# Patient Record
Sex: Male | Born: 1935 | Race: White | Hispanic: No | Marital: Single | Smoking: Former smoker
Health system: Southern US, Community
[De-identification: ages and names within clinical notes are randomized; demographics above are authoritative.]

## PROBLEM LIST (undated history)

## (undated) DIAGNOSIS — N189 Chronic kidney disease, unspecified: Secondary | ICD-10-CM

## (undated) DIAGNOSIS — F0390 Unspecified dementia without behavioral disturbance: Secondary | ICD-10-CM

## (undated) DIAGNOSIS — I5022 Chronic systolic (congestive) heart failure: Principal | ICD-10-CM

## (undated) DIAGNOSIS — D696 Thrombocytopenia, unspecified: Secondary | ICD-10-CM

## (undated) DIAGNOSIS — I509 Heart failure, unspecified: Secondary | ICD-10-CM

## (undated) DIAGNOSIS — E785 Hyperlipidemia, unspecified: Secondary | ICD-10-CM

## (undated) DIAGNOSIS — H9190 Unspecified hearing loss, unspecified ear: Secondary | ICD-10-CM

## (undated) DIAGNOSIS — G629 Polyneuropathy, unspecified: Secondary | ICD-10-CM

## (undated) DIAGNOSIS — I739 Peripheral vascular disease, unspecified: Secondary | ICD-10-CM

## (undated) DIAGNOSIS — D649 Anemia, unspecified: Secondary | ICD-10-CM

## (undated) DIAGNOSIS — I10 Essential (primary) hypertension: Secondary | ICD-10-CM

## (undated) DIAGNOSIS — R001 Bradycardia, unspecified: Secondary | ICD-10-CM

## (undated) DIAGNOSIS — R1310 Dysphagia, unspecified: Secondary | ICD-10-CM

## (undated) DIAGNOSIS — I25810 Atherosclerosis of coronary artery bypass graft(s) without angina pectoris: Secondary | ICD-10-CM

## (undated) HISTORY — DX: Unspecified dementia, unspecified severity, without behavioral disturbance, psychotic disturbance, mood disturbance, and anxiety: F03.90

## (undated) HISTORY — DX: Hyperlipidemia, unspecified: E78.5

## (undated) HISTORY — DX: Chronic systolic (congestive) heart failure: I50.22

## (undated) HISTORY — PX: CORONARY ARTERY BYPASS GRAFT: SHX141

## (undated) HISTORY — DX: Essential (primary) hypertension: I10

## (undated) HISTORY — DX: Peripheral vascular disease, unspecified: I73.9

## (undated) HISTORY — DX: Atherosclerosis of coronary artery bypass graft(s) without angina pectoris: I25.810

---

## 1998-05-24 ENCOUNTER — Other Ambulatory Visit: Admission: RE | Admit: 1998-05-24 | Discharge: 1998-05-24 | Payer: Self-pay | Admitting: Family Medicine

## 2002-04-08 ENCOUNTER — Encounter: Payer: Self-pay | Admitting: *Deleted

## 2002-04-08 ENCOUNTER — Inpatient Hospital Stay (HOSPITAL_COMMUNITY): Admission: RE | Admit: 2002-04-08 | Discharge: 2002-04-15 | Payer: Self-pay | Admitting: *Deleted

## 2002-04-09 ENCOUNTER — Encounter: Payer: Self-pay | Admitting: Thoracic Surgery (Cardiothoracic Vascular Surgery)

## 2002-04-10 ENCOUNTER — Encounter: Payer: Self-pay | Admitting: Thoracic Surgery (Cardiothoracic Vascular Surgery)

## 2002-04-11 ENCOUNTER — Encounter: Payer: Self-pay | Admitting: Thoracic Surgery (Cardiothoracic Vascular Surgery)

## 2002-10-28 ENCOUNTER — Ambulatory Visit (HOSPITAL_COMMUNITY): Admission: RE | Admit: 2002-10-28 | Discharge: 2002-10-29 | Payer: Self-pay | Admitting: Cardiology

## 2002-10-28 ENCOUNTER — Encounter: Payer: Self-pay | Admitting: Cardiology

## 2004-11-23 ENCOUNTER — Ambulatory Visit: Payer: Self-pay | Admitting: Family Medicine

## 2005-04-19 ENCOUNTER — Ambulatory Visit: Payer: Self-pay | Admitting: Family Medicine

## 2005-04-27 ENCOUNTER — Ambulatory Visit: Payer: Self-pay | Admitting: Family Medicine

## 2005-11-01 ENCOUNTER — Ambulatory Visit: Payer: Self-pay | Admitting: Family Medicine

## 2006-01-17 ENCOUNTER — Ambulatory Visit: Payer: Self-pay | Admitting: Family Medicine

## 2006-06-11 ENCOUNTER — Ambulatory Visit: Payer: Self-pay | Admitting: Family Medicine

## 2006-06-25 ENCOUNTER — Ambulatory Visit: Payer: Self-pay | Admitting: Internal Medicine

## 2006-07-10 ENCOUNTER — Ambulatory Visit: Payer: Self-pay | Admitting: Internal Medicine

## 2006-08-14 ENCOUNTER — Ambulatory Visit: Payer: Self-pay | Admitting: Family Medicine

## 2006-10-21 ENCOUNTER — Ambulatory Visit: Payer: Self-pay | Admitting: Family Medicine

## 2006-11-25 ENCOUNTER — Ambulatory Visit: Payer: Self-pay | Admitting: Family Medicine

## 2009-03-24 ENCOUNTER — Ambulatory Visit (HOSPITAL_COMMUNITY): Admission: RE | Admit: 2009-03-24 | Discharge: 2009-03-24 | Payer: Self-pay | Admitting: Podiatry

## 2010-05-26 ENCOUNTER — Encounter (HOSPITAL_BASED_OUTPATIENT_CLINIC_OR_DEPARTMENT_OTHER): Admission: RE | Admit: 2010-05-26 | Discharge: 2010-08-01 | Payer: Self-pay | Admitting: Internal Medicine

## 2010-06-01 ENCOUNTER — Ambulatory Visit: Payer: Self-pay | Admitting: Vascular Surgery

## 2010-06-07 ENCOUNTER — Ambulatory Visit (HOSPITAL_COMMUNITY): Admission: RE | Admit: 2010-06-07 | Discharge: 2010-06-07 | Payer: Self-pay | Admitting: Internal Medicine

## 2010-08-07 ENCOUNTER — Encounter (HOSPITAL_BASED_OUTPATIENT_CLINIC_OR_DEPARTMENT_OTHER): Admission: RE | Admit: 2010-08-07 | Discharge: 2010-08-21 | Payer: Self-pay | Admitting: Internal Medicine

## 2010-10-09 ENCOUNTER — Ambulatory Visit (HOSPITAL_COMMUNITY)
Admission: RE | Admit: 2010-10-09 | Discharge: 2010-10-09 | Payer: Self-pay | Source: Home / Self Care | Admitting: Internal Medicine

## 2010-10-09 ENCOUNTER — Encounter (HOSPITAL_BASED_OUTPATIENT_CLINIC_OR_DEPARTMENT_OTHER)
Admission: RE | Admit: 2010-10-09 | Discharge: 2010-10-24 | Payer: Self-pay | Source: Home / Self Care | Admitting: Internal Medicine

## 2010-12-25 ENCOUNTER — Inpatient Hospital Stay (HOSPITAL_COMMUNITY)
Admission: EM | Admit: 2010-12-25 | Discharge: 2011-01-02 | DRG: 638 | Disposition: A | Discharge status: Skilled Nursing Facility | Payer: MEDICARE | Attending: Family Medicine | Admitting: Family Medicine

## 2010-12-25 ENCOUNTER — Emergency Department (HOSPITAL_COMMUNITY): Payer: MEDICARE

## 2010-12-25 DIAGNOSIS — R4182 Altered mental status, unspecified: Secondary | ICD-10-CM

## 2010-12-25 DIAGNOSIS — E785 Hyperlipidemia, unspecified: Secondary | ICD-10-CM | POA: Diagnosis present

## 2010-12-25 DIAGNOSIS — F0390 Unspecified dementia without behavioral disturbance: Secondary | ICD-10-CM | POA: Diagnosis present

## 2010-12-25 DIAGNOSIS — Z23 Encounter for immunization: Secondary | ICD-10-CM

## 2010-12-25 DIAGNOSIS — N189 Chronic kidney disease, unspecified: Secondary | ICD-10-CM | POA: Diagnosis present

## 2010-12-25 DIAGNOSIS — I129 Hypertensive chronic kidney disease with stage 1 through stage 4 chronic kidney disease, or unspecified chronic kidney disease: Secondary | ICD-10-CM | POA: Diagnosis present

## 2010-12-25 DIAGNOSIS — I2789 Other specified pulmonary heart diseases: Secondary | ICD-10-CM | POA: Diagnosis present

## 2010-12-25 DIAGNOSIS — E119 Type 2 diabetes mellitus without complications: Secondary | ICD-10-CM

## 2010-12-25 DIAGNOSIS — E860 Dehydration: Secondary | ICD-10-CM | POA: Diagnosis present

## 2010-12-25 DIAGNOSIS — I509 Heart failure, unspecified: Secondary | ICD-10-CM | POA: Diagnosis present

## 2010-12-25 DIAGNOSIS — N179 Acute kidney failure, unspecified: Secondary | ICD-10-CM | POA: Diagnosis present

## 2010-12-25 DIAGNOSIS — E1101 Type 2 diabetes mellitus with hyperosmolarity with coma: Principal | ICD-10-CM | POA: Diagnosis present

## 2010-12-25 DIAGNOSIS — I2510 Atherosclerotic heart disease of native coronary artery without angina pectoris: Secondary | ICD-10-CM | POA: Diagnosis present

## 2010-12-25 DIAGNOSIS — I10 Essential (primary) hypertension: Secondary | ICD-10-CM

## 2010-12-25 DIAGNOSIS — Z7982 Long term (current) use of aspirin: Secondary | ICD-10-CM

## 2010-12-25 DIAGNOSIS — Z79899 Other long term (current) drug therapy: Secondary | ICD-10-CM

## 2010-12-25 DIAGNOSIS — Z951 Presence of aortocoronary bypass graft: Secondary | ICD-10-CM

## 2010-12-25 LAB — CBC
HCT: 41.8 % (ref 39.0–52.0)
MCHC: 34.4 g/dL (ref 30.0–36.0)
MCV: 87.3 fL (ref 78.0–100.0)
RDW: 12.9 % (ref 11.5–15.5)
WBC: 5.5 10*3/uL (ref 4.0–10.5)

## 2010-12-25 LAB — BASIC METABOLIC PANEL
BUN: 25 mg/dL — ABNORMAL HIGH (ref 6–23)
CO2: 20 mEq/L (ref 19–32)
GFR calc non Af Amer: 44 mL/min — ABNORMAL LOW (ref >60)
Glucose, Bld: 679 mg/dL (ref 70–99)
Potassium: 4.1 mEq/L (ref 3.5–5.1)

## 2010-12-25 LAB — RAPID URINE DRUG SCREEN, HOSP PERFORMED
Barbiturates: NOT DETECTED
Opiates: NOT DETECTED

## 2010-12-25 LAB — URINALYSIS, ROUTINE W REFLEX MICROSCOPIC
Bilirubin Urine: NEGATIVE
Ketones, ur: 15 mg/dL — AB
Nitrite: NEGATIVE
Urine Glucose, Fasting: >1000 mg/dL — AB
pH: 5 (ref 5.0–8.0)

## 2010-12-25 LAB — HEPATIC FUNCTION PANEL
ALT: 37 U/L (ref 0–53)
AST: 36 U/L (ref 0–37)
Albumin: 3.6 g/dL (ref 3.5–5.2)
Alkaline Phosphatase: 90 U/L (ref 39–117)
Bilirubin, Direct: <0.1 mg/dL (ref 0.0–0.3)
Total Bilirubin: 0.3 mg/dL (ref 0.3–1.2)

## 2010-12-25 LAB — GLUCOSE, CAPILLARY: Glucose-Capillary: 582 mg/dL (ref 70–99)

## 2010-12-25 LAB — DIFFERENTIAL
Eosinophils Relative: 2 % (ref 0–5)
Lymphocytes Relative: 33 % (ref 12–46)
Lymphs Abs: 1.8 10*3/uL (ref 0.7–4.0)
Monocytes Absolute: 0.4 10*3/uL (ref 0.1–1.0)

## 2010-12-25 LAB — POCT I-STAT 3, VENOUS BLOOD GAS (G3P V)
Bicarbonate: 23.6 mEq/L (ref 20.0–24.0)
TCO2: 25 mmol/L (ref 0–100)
pCO2, Ven: 37.4 mmHg — ABNORMAL LOW (ref 45.0–50.0)
pH, Ven: 7.406 — ABNORMAL HIGH (ref 7.250–7.300)

## 2010-12-25 LAB — URINE MICROSCOPIC-ADD ON

## 2010-12-26 LAB — CBC
HCT: 38.7 % — ABNORMAL LOW (ref 39.0–52.0)
Hemoglobin: 12.9 g/dL — ABNORMAL LOW (ref 13.0–17.0)
MCH: 28.9 pg (ref 26.0–34.0)
MCHC: 33.3 g/dL (ref 30.0–36.0)
RBC: 4.46 MIL/uL (ref 4.22–5.81)

## 2010-12-26 LAB — BRAIN NATRIURETIC PEPTIDE: Pro B Natriuretic peptide (BNP): 30 pg/mL (ref 0.0–100.0)

## 2010-12-26 LAB — GLUCOSE, CAPILLARY
Glucose-Capillary: 206 mg/dL — ABNORMAL HIGH (ref 70–99)
Glucose-Capillary: 175 mg/dL — ABNORMAL HIGH (ref 70–99)
Glucose-Capillary: 166 mg/dL — ABNORMAL HIGH (ref 70–99)
Glucose-Capillary: 177 mg/dL — ABNORMAL HIGH (ref 70–99)
Glucose-Capillary: 155 mg/dL — ABNORMAL HIGH (ref 70–99)
Glucose-Capillary: 172 mg/dL — ABNORMAL HIGH (ref 70–99)
Glucose-Capillary: 156 mg/dL — ABNORMAL HIGH (ref 70–99)
Glucose-Capillary: 319 mg/dL — ABNORMAL HIGH (ref 70–99)

## 2010-12-26 LAB — BASIC METABOLIC PANEL
CO2: 24 mEq/L (ref 19–32)
Chloride: 109 mEq/L (ref 96–112)
GFR calc Af Amer: >60 mL/min (ref >60)
Potassium: 2.9 mEq/L — ABNORMAL LOW (ref 3.5–5.1)
Sodium: 142 mEq/L (ref 135–145)

## 2010-12-26 LAB — CARDIAC PANEL(CRET KIN+CKTOT+MB+TROPI)
CK, MB: 10.9 ng/mL (ref 0.3–4.0)
Relative Index: 1.4 (ref 0.0–2.5)
Total CK: 796 U/L — ABNORMAL HIGH (ref 7–232)
Troponin I: 0.06 ng/mL (ref 0.00–0.06)

## 2010-12-26 LAB — LIPID PANEL
Cholesterol: 248 mg/dL — ABNORMAL HIGH (ref 0–200)
LDL Cholesterol: 162 mg/dL — ABNORMAL HIGH (ref 0–99)

## 2010-12-26 LAB — T4, FREE: Free T4: 1.16 ng/dL (ref 0.80–1.80)

## 2010-12-26 LAB — HEMOGLOBIN A1C: Hgb A1c MFr Bld: 13.5 % — ABNORMAL HIGH (ref <5.7)

## 2010-12-27 DIAGNOSIS — F0390 Unspecified dementia without behavioral disturbance: Secondary | ICD-10-CM

## 2010-12-27 DIAGNOSIS — R609 Edema, unspecified: Secondary | ICD-10-CM

## 2010-12-27 LAB — GLUCOSE, CAPILLARY
Glucose-Capillary: 114 mg/dL — ABNORMAL HIGH (ref 70–99)
Glucose-Capillary: 377 mg/dL — ABNORMAL HIGH (ref 70–99)
Glucose-Capillary: 125 mg/dL — ABNORMAL HIGH (ref 70–99)
Glucose-Capillary: 192 mg/dL — ABNORMAL HIGH (ref 70–99)

## 2010-12-27 LAB — BASIC METABOLIC PANEL
CO2: 20 mEq/L (ref 19–32)
Calcium: 8.8 mg/dL (ref 8.4–10.5)
GFR calc Af Amer: >60 mL/min (ref >60)
Potassium: 3.4 mEq/L — ABNORMAL LOW (ref 3.5–5.1)
Sodium: 141 mEq/L (ref 135–145)

## 2010-12-27 LAB — CBC
Hemoglobin: 14.4 g/dL (ref 13.0–17.0)
MCHC: 33.7 g/dL (ref 30.0–36.0)
RBC: 4.94 MIL/uL (ref 4.22–5.81)
WBC: 6.6 10*3/uL (ref 4.0–10.5)

## 2010-12-28 DIAGNOSIS — F0390 Unspecified dementia without behavioral disturbance: Secondary | ICD-10-CM

## 2010-12-28 LAB — GLUCOSE, CAPILLARY
Glucose-Capillary: 168 mg/dL — ABNORMAL HIGH (ref 70–99)
Glucose-Capillary: 202 mg/dL — ABNORMAL HIGH (ref 70–99)

## 2010-12-28 LAB — BASIC METABOLIC PANEL
CO2: 18 mEq/L — ABNORMAL LOW (ref 19–32)
Glucose, Bld: 165 mg/dL — ABNORMAL HIGH (ref 70–99)
Potassium: 3.8 mEq/L (ref 3.5–5.1)
Sodium: 138 mEq/L (ref 135–145)

## 2010-12-29 LAB — GLUCOSE, CAPILLARY
Glucose-Capillary: 184 mg/dL — ABNORMAL HIGH (ref 70–99)
Glucose-Capillary: 200 mg/dL — ABNORMAL HIGH (ref 70–99)
Glucose-Capillary: 211 mg/dL — ABNORMAL HIGH (ref 70–99)
Glucose-Capillary: 107 mg/dL — ABNORMAL HIGH (ref 70–99)
Glucose-Capillary: 135 mg/dL — ABNORMAL HIGH (ref 70–99)

## 2010-12-29 LAB — BASIC METABOLIC PANEL
BUN: 16 mg/dL (ref 6–23)
Calcium: 8.4 mg/dL (ref 8.4–10.5)
Creatinine, Ser: 1.2 mg/dL (ref 0.4–1.5)
GFR calc non Af Amer: 59 mL/min — ABNORMAL LOW (ref >60)

## 2010-12-30 LAB — GLUCOSE, CAPILLARY: Glucose-Capillary: 188 mg/dL — ABNORMAL HIGH (ref 70–99)

## 2010-12-31 LAB — GLUCOSE, CAPILLARY
Glucose-Capillary: 163 mg/dL — ABNORMAL HIGH (ref 70–99)
Glucose-Capillary: 202 mg/dL — ABNORMAL HIGH (ref 70–99)

## 2010-12-31 LAB — BASIC METABOLIC PANEL
BUN: 18 mg/dL (ref 6–23)
Chloride: 107 mEq/L (ref 96–112)
GFR calc Af Amer: >60 mL/min (ref >60)
GFR calc non Af Amer: 52 mL/min — ABNORMAL LOW (ref >60)
Potassium: 4.1 mEq/L (ref 3.5–5.1)

## 2010-12-31 NOTE — Consult Note (Signed)
NAMEELAN, Travis Coleman NO.:  [id number]  MEDICAL RECORD NO.:  [id number]           PATIENT TYPE:  I  LOCATION:  2502                         FACILITY:  [facility]  PHYSICIAN:  Anselm Jungling, MD  DATE OF BIRTH:  16-Feb-1932  DATE OF CONSULTATION:  12/26/2010 DATE OF DISCHARGE:                                CONSULTATION   IDENTIFYING DATA AND REASON FOR REFERRAL:  This is my first contact with Travis Coleman, a 77 year old Caucasian male currently under the care of the Medicine Teaching Service here at [facility], under the auspices of Dr. Doralee Albino.  Psychiatric consultation is requested to assess mental status, assess competency, and make recommendations.  HISTORY OF THE PRESENTING PROBLEMS:  At this point, there is fairly little clear historical information regarding the patient.  He has had limited contacts within the [facility] in the past.  He does have a history of diabetes mellitus, and recently has become insulin dependent.  In addition, he has a history of coronary artery disease, and is status post CABG procedure.  At this time, he is being treated for his various medical conditions, but most prominently for what appears to be an episode of delirium.  In addition, there may be a history of underlying dementia.  Current medical conditions include hypertension, hyperlipidemia, and diabetes mellitus.  The patient is currently receiving hydralazine, Crestor, donepezil, aspirin, carvedilol, and Lantus.  CT scan of the head shows periventricular and corona radiata white matter hypodensities consistent with chronic ischemic microvascular disease.  The patient has been restarted on Aricept.  The patient has a sister, whose name is Aram Beecham, who is involved in his care.  There is currently question as to whether or not he will need some form of nursing home.  In the early morning of December 26, 2010, the patient was  acutely agitated and delirium and required a brief restraints.  MENTAL STATUS:  On observation, the patient is a well-nourished, normally-developed adult male who I found dozing in his hospital bed in the early evening of December 26, 2010.  I was able to wake him fairly easily, but when I did so, it was immediately apparent that he was quite confused, and disoriented.  Actually he is only oriented to person.  He was pleasant, made brief eye contact, but did not make any goal-directed or coherent statements except to state that he was about to get up any way.  He made brief attempts to sit up in bed, but was unable to.  It was apparent that the patient is still in a state of delirium and not able to converse or give history at this time.  IMPRESSION:  AXIS I:  Delirium, secondary to medical conditions and rule out history of dementia, not otherwise specified. AXIS II:  Deferred. AXIS III:  History of hyperlipidemia, diabetes mellitus, hypertension, chronic ischemic microvascular cerebral disease. AXIS IV:  Stressors moderate. AXIS V:  GAF 35.  RECOMMENDATIONS:  I agree with the patient's current management including reinitiation of Aricept.  I agree with Dr. Cyndia Skeeters assessment that we will need to see  how his condition unfolds as he is stabilized medically.  Psychiatry will continue to follow him on a daily basis in the meantime.  Thank you for referring this most interesting patient to Psychiatry service.     Anselm Jungling, MD     SPB/MEDQ  D:  12/26/2010  T:  12/26/2010  Job:  [id number]  Electronically Signed by Geralyn Flash MD on 12/29/2010 08:53:38 AM

## 2011-01-01 LAB — GLUCOSE, CAPILLARY
Glucose-Capillary: 196 mg/dL — ABNORMAL HIGH (ref 70–99)
Glucose-Capillary: 178 mg/dL — ABNORMAL HIGH (ref 70–99)
Glucose-Capillary: 109 mg/dL — ABNORMAL HIGH (ref 70–99)

## 2011-01-02 ENCOUNTER — Encounter: Payer: Self-pay | Admitting: Family Medicine

## 2011-01-02 LAB — BASIC METABOLIC PANEL
BUN: 21 mg/dL (ref 6–23)
Chloride: 107 mEq/L (ref 96–112)
Creatinine, Ser: 1.42 mg/dL (ref 0.4–1.5)
Glucose, Bld: 69 mg/dL — ABNORMAL LOW (ref 70–99)
Potassium: 3.7 mEq/L (ref 3.5–5.1)

## 2011-01-02 LAB — GLUCOSE, CAPILLARY

## 2011-01-03 ENCOUNTER — Encounter: Payer: Self-pay | Admitting: Family Medicine

## 2011-01-03 ENCOUNTER — Non-Acute Institutional Stay: Payer: Self-pay | Admitting: Family Medicine

## 2011-01-03 DIAGNOSIS — I2510 Atherosclerotic heart disease of native coronary artery without angina pectoris: Secondary | ICD-10-CM | POA: Insufficient documentation

## 2011-01-03 DIAGNOSIS — Z593 Problems related to living in residential institution: Secondary | ICD-10-CM | POA: Insufficient documentation

## 2011-01-03 DIAGNOSIS — F0390 Unspecified dementia without behavioral disturbance: Secondary | ICD-10-CM

## 2011-01-03 DIAGNOSIS — I10 Essential (primary) hypertension: Secondary | ICD-10-CM | POA: Insufficient documentation

## 2011-01-03 DIAGNOSIS — E118 Type 2 diabetes mellitus with unspecified complications: Secondary | ICD-10-CM

## 2011-01-03 DIAGNOSIS — E785 Hyperlipidemia, unspecified: Secondary | ICD-10-CM

## 2011-01-03 DIAGNOSIS — I739 Peripheral vascular disease, unspecified: Secondary | ICD-10-CM | POA: Insufficient documentation

## 2011-01-03 NOTE — Assessment & Plan Note (Addendum)
MMSE 18/30 per hospital discharge, will plan to repeat in 2 weeks Continue Aricept, continue depakote at this time for behavior problems in setting of dementia will consider Topamax or Carbemezapine Obtain records from Dr. Sandria Manly [facility])

## 2011-01-03 NOTE — Assessment & Plan Note (Signed)
Based on hospital stay and pt safety goal BP 140-160 systolic, therefore will continue coreg and low dose ACEI at current dose and monitor

## 2011-01-03 NOTE — Assessment & Plan Note (Signed)
A1c 13.5 on Dec 26, 2010, pt has not medications in many months secondary to non-compliance and likley difficulty with memory/dementia. Will continue regimine started at the hospital of Lantus 50 units and Novolog 6 units QAC. Goal A1C less than 8.

## 2011-01-03 NOTE — Assessment & Plan Note (Signed)
Continue Crestor 

## 2011-01-03 NOTE — Assessment & Plan Note (Signed)
Pt plans to be a long-term resident

## 2011-01-03 NOTE — Progress Notes (Signed)
  Subjective:    Patient ID: Travis Coleman, male    DOB: 01/19/1932, 77 y.o.   MRN: [id number]  HPI 77 y.o. Male admitted to [facility] with altered mental status after his sister found him to have slurred speech and disoriented via a telephone conversation. Patient was found to be in a non -ketotic hyperglyemic state with CBG of 679. He was started on Insulin drip and transitioned to Lantus and short acting insulin. Of note pt had stopped his medication a few months prior and was not being followed by his PCP or his neurologist Dr. Sandria Manly who presumably sees him for Dementia.  During hospital stay Diabetes was well controlled with Lantus 60 units and Novolog 6 units with each meal, HTN was controlled with Coreg and small dose of ACE-I. Pt also experienced agitation and was started on Depakote 250mg  BID and haldol prn which was discontinued at discharge after consultation with psychiatry in setting of his dementia. Mr. Rabel was transferred to [facility] nursing facility for long-term treatment.     Review of Systems Denies chest pain, fever, SOB, abd pain, occ left leg pain, + leg swelling, no change in bowel or bladder, + urinary hesitancy     Objective:   Physical Exam Gen- elderly male, pleasant, NAD, alert and oriented to person, place, (See below regarding time) HEENT- PERRL , pupils 3mm bilat, non icteric, EOMI, arcus senilis, MMM, mild wax in right canal TM clear, Left TM clear Neck- no JVD, no bruit, supple, normal ROM though limited as pt tilts head forward CVS- RRR, no murmur, scar mid-line chest well healed RESP- CTAB ABD- NABS, soft, NT, ND, scar on right flank  EXT- 1+ edema, venous stasis changes, Left foot -2nd digit ulceration medial aspect of toe, no erythema, TTP Neuro- gait- rotated right foot, non antalgic, wide gait not seen, fair balance, neg rhomberg,  Mentation- orinted to year, month, not date, correctly identified president Recall 3/3 with reinforcement,,  memory 3/3 words, normal finger to nose        Assessment & Plan:

## 2011-01-03 NOTE — Assessment & Plan Note (Signed)
Pt does not appear to have many symptoms from PVD at this time, Will treat venous stasis component with Lasix 20mg  which pt has had in the past based on previous note

## 2011-01-05 NOTE — H&P (Signed)
NAMEKENNEY, GOING                ACCOUNT NO.:  [id number]  MEDICAL RECORD NO.:  [id number]           PATIENT TYPE:  I  LOCATION:  2502                         FACILITY:  [facility]  PHYSICIAN:  Santiago Bumpers. Shirlette Scarber, M.D.DATE OF BIRTH:  1932/08/15  DATE OF ADMISSION:  12/26/2010 DATE OF DISCHARGE:                             HISTORY & PHYSICAL   PRIMARY CARE PHYSICIAN:  Unknown provider at [facility].  CHIEF COMPLAINT:  Altered mental status.  HISTORY OF PRESENT ILLNESS:  History was obtained from the patient's sister, Micah Barnier.  Phone number [phone].  She reports that the patient was not responding to her phone calls this morning. When she went to where he lives at a [facility], his speech was slurred and he was disoriented.  He was awake and otherwise in his normal state of health. She asked if he will be willing to go to the hospital and he agreed. She brought him to the ED.  Per his sister, he has stopped taking his medications for an unknown number of months.  He has lived in the hotel for 2 months.  Prior to that, he lived with his sister for 6 weeks, but she is not sure if he was taking his medications since then.  Prior to living with his sister, he lived alone in his own apartment.  His sister is unsure of his meds and doses.  She knows that he was taking metformin and was started on insulin sometime last February or March.  He has not seen his PCP in almost a year.  He has been seen by Wound Care for lower extremity ulcers.  He has also been seen by neurologist, Dr. Sandria Manly for dementia.  Last seen in the Wound Care, by last dictation, was November 2011, and prior to that he saw Dr. Sandria Manly, Neurology in June 2011.  In the ED, the patient was found to be hyperglycemic with blood sugar of 679. He was started on NovoLog 16 units x2, normal saline bolus, and 500 mL of normal saline at 250 mL per hour as well as Ativan 1 mg IV x1.  ALLERGIES:  The  patient has no know drug allergies.  MEDICATIONS:  His medications were obtained from a wound care note on May 29, 2010, listed as: 1. Carvedilol 6.25 mg p.o. b.i.d. 2. Furosemide 20 mg p.o. daily. 3. Aspirin 81 mg daily. 4. Crestor 20 mg daily. 5. Donepezil 10 mg daily.  PAST MEDICAL HISTORY: 1. Diabetes type 2. 2. Hypertension. 3. Hyperlipidemia. 4. Coronary artery disease status post quadruple bypass in 2004. 5. Dementia.  SOCIAL HISTORY:  The patient lives at a hotel, lives alone.  He is unmarried.  He has no children.  He denies smoking cigarettes.  He does not drink alcohol or use any illicit drugs.  FAMILY HISTORY:  Significant for mother who died at 78 with diabetes and Alzheimer's, father who died at 17 with Alzheimer's, coronary artery disease, and TIA.  One deceased sister, died with Alzheimer's and other 1 brother and 2 sisters who are healthy.  REVIEW OF SYSTEMS:  The patient  denies all but is altered.  Notably, he denies fevers, chills, chest pain, shortness of breath, cough, nausea, vomiting, diarrhea, or dysuria.  He also denies visual changes, dysarthria, or weakness.  He denies polyuria, polydipsia, or heat or cold intolerance.  Code status is unable to be obtained, sister does not know.  For now the patient is full code.  PHYSICAL EXAMINATION:  VITAL SIGNS:  Temperature 97.9; pulse 98; respiratory rate 19; blood pressure 192/88, ranging 182-192/68-88; and pO2 of 96-100% on room air. GENERAL:  The patient is awake, slightly agitated, and pulling at his line. HEENT:  NCAT.  PERRLA.  Dry mucous membrane. NECK:  Nontender, supple with no cervical adenopathy. CARDIOVASCULAR:  S1 and S2.  Sinus rhythm. LUNGS:  Clear to auscultation bilaterally.  Normal work of breathing. ABDOMEN:  Normoactive bowel sounds.  Soft, nontender, and nondistended. EXTREMITIES:  He has 5/5 strength in the upper and lower extremities. 2+ pulses in bilateral upper extremities, 1+ in  bilateral lower extremities.  1+ edema in bilateral lower extremities. NEURO:  He is alert and oriented to person, to the month, not to place. He does recognize his sister.  Cranial nerves II-XII are grossly intact. Sensory and motor grossly intact.  He has decreased reflexes bilaterally even.  LABORATORY DATA AND STUDIES:  Sodium 138, potassium 4.1, chloride 95, bicarb 20, BUN 25, creatinine 1.55, and glucose 679 down to 582 most recently.  CBG:  PH 7.406, PCO2 37.4, PO2 41, bicarb 22.6, O2 sat 78%, base excess less than 1.  Blood alcohol level less than 5.  Liver enzymes are within normal limits.  UDS is negative.  UA shows greater than 1000 glucose, small blood, otherwise within normal limits.  Chest x- ray shows enlargement of cardiac silhouette, post CABG, no acute abnormalities.  EKG shows no significant changes compared to 2003.  He does have a prolonged QRS.  CT of the head shows no hemorrhagic mass, lesions, or acute infarct.  Periventricular corona  matter hyperintensities, most compatible with chronic ischemic microvascular white matter disease.  Foot x-ray shows hammertoe deformities of second through fifth toe with chronic spurring and arthropathy of the second distal interphalangeal joint,plantar and Achilles calcaneal spurs.  ASSESSMENT/PLAN:  This 77 year old male with known diabetes type 2 presenting with altered mental status and hyperglycemia. 1. Altered mental status, likely secondary to hyperglycemia.  CT of     the head within normal limits.  There is no evidence of trauma or     neurovascular insults, but alcohol level was within normal limits.     Urine drug screen is within normal limits.  The patient is afebrile     and is not having elevating white blood cell count suggestive of     infection.  So, plan is to admit and sent this patient to the ICU     and start insulin drip.  Also, to transition to subcu insulin, most     likely Lantus 30 units and moderate  sliding scale when CBGs are     within range.  We will hydrate somewhat conservatively given     coronary artery disease and edema as on physical exam, obtain A1c     and BMET in the morning, Ativan 2 mg IV nightly as needed for     agitation and insomnia. 2. Pulmonary hypertension, untreated.  We will restart blood pressure     meds per wound care note. Hydralazine 10 mg IV q.4 p.r.n. systolic     blood pressure  greater than 200. 3. Dementia, at baseline.  The patient has dementia at baseline.  He     is altered now, so we will restart home dose of Aricept. 4. Hyperlipidemia.  We will restart Crestor.  5. Fluids, electrolytes, nutrition and gastrointestinal.  N.p.o. for     now.  IV hydration. 6. Prophylaxis.  Heparin 5000 units subcu t.i.d.  Protonix 20 mg IV     daily. 7. Disposition:  Pending clinical improvement.  We will transition     back to subcu insulin.  We will likely discharge the patient to SNF     as the patient's sister was working on finding SNF placement prior     to admission.    ______________________________ Dessa Phi, MD   ______________________________ Santiago Bumpers. Leveda Anna, M.D.    JF/MEDQ  D:  12/26/2010  T:  12/26/2010  Job:  [id number]  cc:   [facility]  Electronically Signed by Dessa Phi MD on 01/03/2011 12:12:22 PM Electronically Signed by Doralee Albino M.D. on 01/05/2011 04:02:23 PM

## 2011-01-09 NOTE — Discharge Summary (Signed)
NAMEJEROMEY, Travis Coleman NO.:  [id number]  MEDICAL RECORD NO.:  [id number]           PATIENT TYPE:  I  LOCATION:  2502                         FACILITY:  [facility]  PHYSICIAN:  Leighton Roach Ingrid Shifrin, M.D.DATE OF BIRTH:  [birth year]  DATE OF ADMISSION:  12/26/2010 DATE OF DISCHARGE:  01/02/2011                              DISCHARGE SUMMARY   PRIMARY CARE PROVIDER:  [facility]  DISCHARGE DIAGNOSES: 1. Hyperglycemia-related altered mental status 2. Diabetes type 2. 2. Hypertension. 3. Hyperlipidemia. 4. Dementia. 5. History of coronary artery disease status post cardiac bypass in     2004.  DISCHARGE MEDICATIONS:  New medications at discharge, 1. Aspirin 81 mg p.o. daily. 2. Carvedilol 6.25 mg p.o. twice daily with meals. 3. Depakote 250 mg 1 tablet p.o. twice daily. 4. Donepezil 10 mg 1 tablet p.o. daily at bedtime. 5. Enalapril 2.5 mg p.o. daily. 6. Insulin aspart NovoLog 6 units subcu t.i.d. with meals. 7. Insulin NovoLog sliding scale 2-5 units subcu daily at bedtime. 8. Insulin Lantus 60 units subcu q.a.m. 9. Crestor 20 mg p.o. daily.  Echo December 27, 2010 show left ventricular hypertrophy, normal systolic function with an ejection fraction of 60% to 65%.  PERTINENT LABS AT DISCHARGE:  Glucose 69, most recent 259.  A1c 13.5. TSH 1.247, T4 1.16.  BNP 30.  LDL 162.  BRIEF HOSPITAL COURSE:  The patient is a 77 year old male with a known history of diabetes type 2 and dementia at baseline who presented with altered mental status, known to have hyperglycemia on admission into the 600. 1. Hyperglycemia.  The patient was started on a insulin drip and was     quickly transitioned to Lantus 50 and NovoLog sliding scale.  He     was hyperglycemic, but was not filling ketones or acidotic, was     admitted for hyperglycemic nonketotic state.  His blood sugars     normalized over the course of 12 hours and he was transitioned from  insulin drip to subcu insulin.  The patient's A1c was as mentioned     above, very poorly controlled type 2 diabetic.  He did well during     his hospital course.  Blood sugars are mostly within the 200s and     100s.  At discharge, he is on Lantus 60 units q.a.m. and NovoLog 6     units t.i.d. with meals and at night. 2. Altered mental status.  The patient's sensorium was slightly cloudy     on admission, but the improved little during his hospital course.     She is determined to have dementia at baseline.  Orientation normal     to person and occasionally to time, but not to place.  Poor memory.     Mini mental status exam is 18.  He was agitated during the hospital     stay requiring Haldol 1 mg IV at bedtime x2 night.  He was started     on Depakote 250 mg p.o. b.i.d. and has done well since the Depakote     started.  He is also  continued on donepezil. 3. Hyperlipidemia.  The patient's LDL is high at 162.  He is on     Crestor, to be increased as needed for goal of LDL control. 4. Hypertension.  The patient's blood pressures were well-controlled     throughout this admission.  He has a wide pulse pressure.  He was     treated conservatively with Coreg 6.25 mg p.o. b.i.d. and a small     dose of Ace, Vasotec 2.5 mg p.o. daily with goal systolic blood     pressures within 140 and 160.  DISCHARGE PHYSICAL EXAMINATION:  On day of discharge, the patient is in no acute distress.  He is alert to person and time, and not to place. He is not agitated.  HEART/LUNGS:  Within normal limits.  He is no lower extremity edema.  FOLLOWUP ISSUES AND RECOMMENDATIONS: 1. Dementia with aggression plus agitation.  We will increase Depakote     to low his therapeutic dose.  The patient is currently doing well     on current dose.  Dose may be increased as needed. 2. Hyperlipidemia, increase statin to achieve goal.  Given the     patient's history of coronary artery disease, goal is LDL less than      70.  DISCHARGE INSTRUCTIONS:  The patient is discharged to [facility] with no limitations on activity or instructed to consume a heart- healthy diet.  DISCHARGE CONDITION:  The patient is discharged to [facility] in stable medical condition.    ______________________________ Dessa Phi, MD   ______________________________ Leighton Roach Joaquina Nissen, M.D.    JF/MEDQ  D:  01/02/2011  T:  01/02/2011  Job:  [id number]  Electronically Signed by Dessa Phi MD on 01/03/2011 12:13:03 PM Electronically Signed by Acquanetta Belling M.D. on 01/09/2011 11:19:29 AM

## 2011-01-18 NOTE — Progress Notes (Signed)
Medication form from [facility] was reviewed. Coreg, Lantus, and Novulog doses were corrected to match those in our list and Rosuvastin was added. Follow-up with Dr Hal Hope at [facility]

## 2011-01-19 ENCOUNTER — Encounter: Payer: Self-pay | Admitting: Family Medicine

## 2011-01-19 NOTE — Progress Notes (Signed)
  Subjective:    Patient ID: Travis Coleman, male    DOB: Mar 11, 1932, 77 y.o.   MRN: [id number]  HPI     Review of Systems     Objective:   Physical Exam        Assessment & Plan:  I interviewed and examined Travis Coleman and discussed his nursing home care plan with Dr. Jeanice Lim

## 2011-01-19 NOTE — Telephone Encounter (Signed)
Opened in error

## 2011-01-29 LAB — GLUCOSE, CAPILLARY: Glucose-Capillary: 303 mg/dL — ABNORMAL HIGH (ref 70–99)

## 2011-01-30 LAB — GLUCOSE, CAPILLARY
Glucose-Capillary: 314 mg/dL — ABNORMAL HIGH (ref 70–99)
Glucose-Capillary: >600 mg/dL (ref 70–99)

## 2011-02-01 LAB — GLUCOSE, CAPILLARY
Glucose-Capillary: 220 mg/dL — ABNORMAL HIGH (ref 70–99)
Glucose-Capillary: 283 mg/dL — ABNORMAL HIGH (ref 70–99)

## 2011-02-02 LAB — GLUCOSE, CAPILLARY: Glucose-Capillary: 375 mg/dL — ABNORMAL HIGH (ref 70–99)

## 2011-04-03 NOTE — Procedures (Signed)
DUPLEX DEEP VENOUS EXAM - LOWER EXTREMITY   INDICATION:  Left toe ulcer.   HISTORY:  Edema:  No.  Trauma/Surgery:  No.  Pain:  Yes.  PE:  No.  Previous DVT:  No.  Anticoagulants:  No.  Other:   DUPLEX EXAM:                CFV   SFV   PopV  PTV    GSV                R  L  R  L  R  L  R   L  R  L  Thrombosis    o  o  o  o  o  o  o   o  o  o  Spontaneous   +  +  +  +  +  +  +   +  +  +  Phasic        +  +  +  +  +  +  +   +  +  +  Augmentation  +  +  +  +  +  +  +   +  +  +  Compressible  +  +  +  +  +  +  +   +  +  +  Competent     +  o  +  +  +  +  +   +  +  o   Legend:  + - yes  o - no  p - partial  D - decreased   IMPRESSION:  There is no evidence of deep venous thrombus noted in  bilateral legs.  There is reflux noted in the left common femoral vein  and left greater saphenous vein.    _____________________________  Di Kindle. Edilia Bo, M.D.   CB/MEDQ  D:  06/01/2010  T:  06/01/2010  Job:  [id number]

## 2011-04-06 NOTE — Op Note (Signed)
Travis Coleman, Travis Coleman NO.:  [id number]   MEDICAL RECORD NO.:  [id number]                   PATIENT TYPE:  OIB   LOCATION:  2899                                 FACILITY:  [facility]   PHYSICIAN:  Salvadore Farber, M.D. [facility]         DATE OF BIRTH:  07/22/1932   DATE OF PROCEDURE:  10/28/2002  DATE OF DISCHARGE:                                 OPERATIVE REPORT   PROCEDURE PERFORMED:  Abdominal aortography without runoff, selective left  renal angiography, stent to the left renal artery.   INDICATIONS FOR PROCEDURE:  The patient is a 77 year old gentleman with  coronary and peripheral arterial disease who underwent coronary artery  bypass grafting in May of 2003.  Catheterization performed prior to that  revascularization procedure demonstrated 80% stenosis of the ostium of the  left renal artery.  I have followed him since.  His blood pressure has been  difficult to control despite four medications.  Creatinine has been stable  at 1.2.  Due to his difficulty controlling hypertension requiring multiple  medications, he was referred for diagnostic angiography to the left renal  artery with an eye to percutaneous revascularization.   DESCRIPTION OF PROCEDURE:  Informed consent was obtained.  Under 1%  lidocaine local anesthesia, a 6 French sheath was placed in the right  femoral artery using a modified Seldinger technique.  4000 units of IV  heparin was administered to achieve an ACT of 247 seconds.  A 6 French  pigtail catheter was advanced into the suprarenal abdominal aorta.  Abdominal aortography without runoff was performed by power injection.  A 6  [place] guide was then advanced over a wire and engaged in the ostium of  the left renal artery.  Selective left renal angiography was performed by  hand injection.  A stabilizer wire was then advanced into the distal portion  of the renal vasculature.  The lesion was predilated using 4.0 x 15 mm  Aviator balloon at 8 atmospheres for two sequential inflations.  Due to  substantial recoil with residual 80% stenosis and substantial gradient, the  lesion was then stented using a Genesis on the Aviator 5.0 x 15 mm stent  deployed at 12 atmospheres.  The stent balloon was then pulled back several  millimeters and used to again dilate the ostium of the stent at 12  atmospheres.  Final angiograms after the removal of the wire demonstrated no  residual stenosis and a normal nephrogram.  The right femoral arteriotomy  was then closed using a 6 French Perclose device.   COMPLICATIONS:  None.   IMPRESSION/PLAN:  Successful stenting of the left renal artery resulting in  no residual stenosis.  The patient will be hospitalized for observation.  Anticipate discharge in the morning.  Salvadore Farber, M.D. [facility]    WED/MEDQ  D:  10/28/2002  T:  10/28/2002  Job:  [id number]   cc:   Tresa Endo L. Philipp Deputy, M.D.  [phone] [address]  [place] [address]  Fax: [phone]

## 2011-04-06 NOTE — Consult Note (Signed)
Travis Coleman. [facility]  Patient:    Travis Coleman, Travis Coleman Visit Number: [id number] MRN: [id number]          Service Type: SUR Location: 2000 2003 01 Attending Physician:  Tressie Stalker Dictated by:   Salvatore Decent. Cornelius Moras, M.D. Proc. Date: 04/08/02 Admit Date:  04/08/2002   CC:         Daisey Must, M.D. [facility]  [facility]  Dr. Angelena Sole; [employer]  Willis Modena, M.D.; [facility]   Consultation Report  REQUESTING PHYSICIAN:  Daisey Must, M.D.  PRIMARY CARE PHYSICIAN:  Dr. Angelena Sole and Dr. Margarette Canada at the [facility]  REASON FOR CONSULTATION:  Left main disease, three vessel coronary artery disease with new onset exertional angina.  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  He has no previous cardiac history.  He describes a four- to five-month history of symptoms of exertional angina which are classical in nature.  He states they began in January 2003. His symptoms consist of heaviness and tightness in his chest which radiate to his left arm and associated with shortness of breath and numbness in the left arm.  His symptoms are always relieved with rest.  He denies any episodes of chest pain occurring at rest or with minimal activity.  He reports no episodes of nocturnal angina.  He denies any history of PND, orthopnea, or lower extremity edema.  He reports occasional episodes of dizziness but he denies any history of palpitations or syncope.  He was evaluated by Dr. Gerri Spore on May 15 and brought in for elective cardiac catheterization today.  This demonstrates 75% left main coronary artery stenosis as well as severe three vessel coronary artery disease with preserved left ventricular function. Cardiac surgical consultation is requested.  REVIEW OF SYSTEMS:   GENERAL:  This patient reports otherwise feeling well.  He denies any problems with weight loss, loss of appetite.  CARDIAC:  As noted previously.  RESPIRATORY:  The patient reports exertional shortness of breath. He denies any productive cough, hemoptysis, or wheezing.  GASTROINTESTINAL: Negative.  The patient reports no problems with dysphagia.  He denies any problems with constipation, diarrhea, hematochezia, hematemesis, or melena. INFECTIOUS:  Negative.  The patient denies any recent history of fevers or chills.  NEUROLOGIC:  Negative.  The patient does report occasional headache. Otherwise, he denies any symptoms of transient monocular blindness or transient numbness or weakness involving either upper or lower extremity. MUSCULOSKELETAL:  Negative.  The patient denies any problems with chronic arthritis.  GENITOURINARY:  Negative.   HEMATOLOGIC:  Negative.  The patient denies any bleeding diathesis, easy bruising, or frequent episodes of epistaxis.  ENDOCRINE:  Notable for history of type 2 diabetes mellitus. Patient does not check his blood sugars at home.  PSYCHIATRIC:  Negative. PERIPHERAL VASCULAR:  Negative.  PAST MEDICAL HISTORY:  Notable for hypertension, type 2 diabetes mellitus, hyperlipidemia.  The patient denies any known history of previous stroke. Also notable for a history of dermatitis as well as some varicosities in his left lower leg.  He has a remote history of kidney stones as well as cataract removal and lens implantation in both eyes.  FAMILY HISTORY:  He has several family members with coronary artery disease including one brother and one sister who underwent coronary artery bypass grafting.  There is no history of early onset coronary artery disease.  SOCIAL HISTORY:  The patient is a retired Environmental manager and lives alone.  He has a sister who is supportive and lives nearby.  He reports a previous history of cigar smoking, although he quit smoking cigars  completely approximately two months ago.  He denies any history of excessive alcohol consumption.  MEDICATIONS:  Actos, Glucovance, Zocor, Lopid, Lotrel, aspirin, and Toprol.  ALLERGIES:  Denies any known drug allergies or sensitivities.  PHYSICAL EXAMINATION  GENERAL:  Well-appearing white male who appears his stated age in no acute distress.  HEENT:  Essentially unrevealing.  He is edentulous.  NECK:  Supple.  There is no cervical or supraclavicular lymphadenopathy. There is no jugular venous distention.  No carotid bruits are appreciated.  LUNGS:  Breath sounds are clear and symmetrical bilaterally.  No wheezes or rhonchi are noted.  CARDIOVASCULAR:  Regular rate and rhythm.  No murmurs, rubs, or gallops are appreciated.  ABDOMEN:  Soft and nontender.  Liver edge is not enlarged.  There are no masses.  Bowel sounds are present.  EXTREMITIES:  Warm and well perfused.  There is no lower extremity edema. Distal pulses are diminished, although palpable in both lower legs at the ankles.  There are some varicose veins in the left lower leg.  SKIN:  Clean and dry.  There is a diffuse scaly dry rash which is pronounced particularly on his face.  The patient states this is chronic.  NEUROLOGIC:  Grossly nonfocal and symmetrical throughout.  RECTAL:  Deferred.  GENITOURINARY:  Deferred.  DIAGNOSTIC TESTS:  Cardiac catheterization performed today by Dr. Gerri Spore is reviewed.  This demonstrates 75% distal left main coronary artery stenosis. There is diffuse coronary artery disease with diffuse plaque in all epicardial vessels.  There is 50% proximal stenosis in the left anterior descending coronary artery and 70% stenosis of the distal portion of this vessel just  before a large second diagonal branch.  There is a large first diagonal branch with some low grade proximal disease.  The left circumflex coronary artery gives rise to a circumflex marginal branch which has an  80-90% proximal stenosis.  This is relatively small vessel.  There appear to be relatively poor targets for grafting in this vessel.  There is 70% ostial stenosis of the left circumflex coronary artery.  There is 95% ostial stenosis of the right coronary artery.  There is 60% stenosis of the mid portion of this vessel. The distal right coronary artery gives rise to posterior descending coronary artery and two posterolateral branches.  There is high grade stenosis in the posterior descending coronary artery.  Left ventricular function appears preserved with normal ejection fraction estimated 65%.  LABORATORY DATA:  Hemoglobin 13.4, hematocrit 39% prior to catheterization. Coagulation profile is within normal limits.  Total cholesterol was elevated at 213 with triglycerides 100, HDL cholesterol 47, LDL cholesterol 146. Baseline creatinine 1.3 prior to catheterization.  Non-invasive vascular laboratory demonstrates 60-80% right internal coronary artery stenosis.  IMPRESSION:  Left main disease and severe three vessel coronary artery disease with new onset exertional angina and preserved left ventricular function.  Mr. Emberton has comorbid condition including hypertension, type 2 diabetes mellitus, hyperlipidemia, and asymptomatic right internal coronary artery stenosis.  He has a remote history of tobacco use.  I believe that he would benefit from coronary artery bypass grafting.  PLAN:  I have outlined at length the indications and potential benefits of surgery with Mr. Say and his sister.  Alternative treatment strategies have been discussed.  They understand and accept all  associated risks of surgery including, but not limited to, risk of death, stroke, myocardial infarction, bleeding requiring blood transfusion, arrhythmia, infection, and recurrent coronary artery disease.  All their questions have been addressed. Dictated by:   Salvatore Decent Cornelius Moras, M.D. Attending Physician:  Tressie Stalker DD:  04/08/02 TD:  04/11/02 Job: [id number]

## 2011-04-06 NOTE — Op Note (Signed)
[place]. [facility]  Patient:    Travis Coleman, Travis Coleman Visit Number: [id number] MRN: [id number]          Service Type: SUR Location: 2000 2003 01 Attending Physician:  Tressie Stalker Dictated by:   Salvatore Decent. Cornelius Moras, M.D. Proc. Date: 04/09/02 Admit Date:  04/08/2002   CC:         Daisey Must, M.D.  Collene Schlichter, M.D.  Willis Modena, M.D.   Operative Report  PREOPERATIVE DIAGNOSIS: Left main disease, three vessel coronary artery disease with Class 2 new onset angina.  POSTOPERATIVE DIAGNOSIS: Left main disease, three vessel coronary artery disease with Class 2 new onset angina.  PROCEDURE: Median sternotomy for coronary artery bypass grafting times six (left internal mammary artery to distal left anterior descending coronary artery, saphenous vein graft to first diagonal branch, saphenous vein graft to first circumflex marginal branch, saphenous vein graft to posterior descending artery and sequential saphenous vein graft to first right posterolateral branch and sequential saphenous vein graft to second right posterolateral branch).  SURGEON:  Salvatore Decent. Cornelius Moras, M.D.  ASSISTANT:  Lissa Merlin, PA.  ANESTHESIA:  General.  BRIEF CLINICAL NOTE:  The patient is a 77 year old white male with hypertension, diabetes and hyperlipidemia who presents with new onset symptoms of exertional angina.  Catheterization performed by Dr. Gerri Spore demonstrates 75% left main coronary artery stenosis and severe three vessel coronary artery disease with preserved left ventricular function.  A full consultation note has been dictated previously.  The patient and his sister have been counseled at length regarding the indications and potential benefits of surgery. They understand and accept all associated risks of surgery and desire to proceed as described.  OPERATIVE NOTE IN DETAIL: The patient is brought to the operating room on the above mentioned date and central  invasive hemodynamic monitoring is established by the Anesthesia service under the care and direction of Dr. Adonis Huguenin.  Specifically, a Swan-Ganz catheter is placed through the right internal jugular approach.  A radial arterial line is placed.  The patient is placed in the supine position on the operating table.  Intravenous antibiotics are administered.  Following induction with general endotracheal anesthesia, a Foley catheter is placed.  The patients chest, abdomen, both groins and both lower extremities are prepared and draped in a sterile manner.  A median sternotomy incision is performed and the left internal mammary artery is dissected from the chest wall and prepared for bypass grafting. The left internal mammary artery is notably good quality conduit.  Simultaneously, saphenous vein is obtained from the patients right thigh and the upper portion of the right lower leg using endoscopic vein harvest technique.  The saphenous vein is felt to be fair to good quality conduit.  The patient is heparinized systemically.  The pericardium is opened.  The ascending aorta is mildly sclerotic.  The aorta is cannulated uneventfully.  The right atrium is cannulated for cardiopulmonary bypass.  Adequate heparinization is verified.  Cardiopulmonary bypass is begun and the surface of the heart is inspected.  Distal sites are selected for coronary artery bypass grafting.  The patient has diffuse epicardial coronary artery disease with visible and palpable plaque diffusely located in all the vessels. There are relatively small coronary arteries as well.  Portions of saphenous vein and the left internal mammary artery are trimmed to the appropriate length.  A temperature probe is placed in the left ventricular septum and a styrofoam pad is placed to protect the left phrenic nerve from thermal injury  A cardioplegia catheter is placed in the ascending aorta.  The patient is cooled to 32 degrees  systemic temperature.  The aortic cross-clamp is applied and cardioplegia is delivered in an antegrade fashion through the aortic root.  Ice saline slush is applied for topical hypothermia. The initial cardioplegia graft and myocardial cooling are felt to be excellent.  Repeat doses of cardioplegia are administered intermittently throughout the cross-clamp portion of the operation, both through the aortic root and down subsequently placed vein grafts to maintain septal temperature below 15 degrees CEntigrade.  The following distal coronary anastomoses are performed: 1. The posterior descending coronary artery is grafted with a saphenous vein    graft in a side to side fashion. This coronary is diffusely diseased and   very poor target for grafting. It is grafted in its proximal segment. There is high grade stenosis distal to this level, but there is nowhere to    graft this vessel beyond the level of high grade stenosis.  It is diffusely    diseased throughout and measures 1.0 mm in diameter. 2. The first posterolateral branch off the distal right coronary artery is    grafted using the sequential saphenous vein graft off of the vein placed to    the posterior descending coronary artery.  This coronary measures 1.3 mm in    diameter and is of fair quality. 3. The second posterolateral branch off the distal right coronary artery is    grafted using a sequential saphenous vein graft off of the vein placed to    the posterior descending and first posterolateral branches.  This coronary    measures 1.2 mm in diameter and is of fair quality. 4. The first circumflex marginal branch is grafted with a saphenous vein graft in an end to side fashion.  This coronary is intramyocardial and    of fair to poor quality.  It measures 1.2 mm in diameter.  5. The first diagonal branch off the left anterior descending is grafted with    a saphenous vein graft in an end to side fashion. This coronary  measures    1.4 mm in diameter and is of fair quality. 6. The distal left anterior descending coronary artery is grafted with the    left internal mammary artery in an end to side fashion. This coronary    measures 1.5 mm at the site of distal bypass and is of fair quality.  All    three proximal saphenous vein anastomoses are performed directly to the    ascending aorta prior to removal of the aortic cross-clamp.  The septal    temperature is noted to rise rapidly and dramatically upon reperfusion of    the left internal mammary artery.  The aortic cross-clamp is removed after    a total cross-clamp time of 96 minutes.  The heart begins to beat spontaneously without need for cardioversion. All proximal and distal anastomoses are inspected for hemostasis and appropriate graft orientation.  Epicardial pacing wires are fixed to the right ventricular outflow tract and to the right atrial appendage.  The patient is weaned from cardiopulmonary bypass without difficulty.  The patients rhythm at separation from bypass is normal sinus rhythm.  No inotropic support is required.  Total cardiopulmonary bypass time for the operation is 137 minutes.  Venous and arterial cannulae are removed uneventfully. The mediastinum and the left chest are irrigated with saline solution containing vancomycin. MEticulous surgical hemostasis is ascertained.  The mediastinum and the left  chest are drained with three chest tubes placed through separate stab incisions inferiorly.  The median sternotomy is closed in a routine fashion. The deep subcutaneous tissues of the right thigh are drained with a 19 [place] Blake drain. The right lower extremity incisions are closed in multiple layers in routine fashion.  All skin incisions are closed with subcuticular skin closure.  The patient tolerated the procedure well and is transported to the surgical intensive care unit in stable condition.  There were no  intraoperative complications.  All sponge, instrument and needle counts are verified correct at the completion of the operation.  The patient was transfused one unit of packed red blood cells during cardiopulmonary bypass due to anemia with a hematocrit of 17% during cardiopulmonary bypass. Dictated by:   Salvatore Decent Cornelius Moras, M.D. Attending Physician:  Tressie Stalker DD:  04/09/02 TD:  04/12/02 Job: [id number]

## 2011-04-06 NOTE — Cardiovascular Report (Signed)
[place]. [facility]  Patient:    Travis Coleman, Travis Coleman Visit Number: [id number] MRN: [id number]          Service Type: CAT Location: 2300 2316 01 Attending Physician:  Tressie Stalker Dictated by:   Daisey Must, M.D. [facility] Proc. Date: 04/08/02 Admit Date:  04/08/2002   CC:         Tresa Endo L. Philipp Deputy, M.D.  Cardiac Catheterization Laboratory   Cardiac Catheterization  PROCEDURES PERFORMED: Left heart catheterization with coronary angiography and left ventriculography, and abdominal aortography.  INDICATIONS: The patient is a 77 year old male with multiple cardiovascular risk factors including diabetes, hyperlipidemia and hypertension. He has had three months of progressive exertional chest pain and dyspnea. Based on these symptoms we opted to proceed with cardiac catheterization.  DESCRIPTION OF PROCEDURE: A 6 French sheath was placed in the right femoral artery.  Standard Judkins 6 French catheters were utilized.  Contrast was Omnipaque.  There were no complications.  RESULTS:  HEMODYNAMICS: Left ventricular pressure 156/29, aortic pressure 156/62.  There is no aortic valve gradient.  LEFT VENTRICULOGRAM: Wall motion is normal. Ejection fraction estimated at greater than or equal to 65%. There is no mitral regurgitation.  Left subclavian arteriogram reveal patent left subclavian artery and internal mammary artery.  ABDOMINAL AORTOGRAM: Abdominal aortogram reveals an 80% stenosis in the left renal artery. Right renal artery is patent. Abdominal aorta and iliac arteries are free of significant disease.  CORONARY ARTERIOGRAPHY: (Right dominant). Note, the coronaries are moderately calcified.  Left main has an ostial 50% stenosis and a distal 75% stenosis.  Left anterior descending artery has a 40% stenosis in the proximal vessel, 40p in the mid vessel followed by a 75% stenosis in the distal portion of the mid vessel. The LAD gives rise to a normal  sized first diagonal, which has a 20% stenosis. There is also a normal sized second diagonal.  Left circumflex is a relatively small, diffusely diseased vessel. There is a 60% stenosis at its origin. The circumflex gives rise to three small obtuse marginal branches. The distal circumflex has a diffuse 80-90% stenosis. The second obtuse marginal branch also has an 80% stenosis.  Right coronary artery is a dominant vessel. There is a 99% stenosis at the ostium of the right coronary artery with heavy calcification. There is a 60% stenosis in the mid vessel and a 60% stenosis in the distal vessel. The distal right coronary artery gives rise to a small to normal sized posterior descending artery, which is diffusely diseased with a long 70% followed by a long 99% stenosis. The first posterolateral branch is a large vessel and has a 40% stenosis. There is also a large second posterolateral branch.  IMPRESSION: 1. Normal left ventricular systolic function. 2. Severe three-vessel coronary artery disease.  PLAN: The patient will be admitted to the hospital and started on intravenous heparin. He will be referred for coronary artery bypass surgery. Dictated by:   Daisey Must, M.D. [facility] Attending Physician:  Tressie Stalker DD:  04/08/02 TD:  04/10/02 Job: [id number]

## 2011-04-06 NOTE — Discharge Summary (Signed)
[place]  Patient:    Travis Coleman, Travis Coleman Visit Number: [id number] MRN: [id number]          Service Type: Attending:  Salvatore Decent. Cornelius Moras, M.D. Dictated by:   Sherrie George, P.A. Adm. Date:  04/08/02 Disc. Date: 04/15/02   CC:         Daisey Must, M.D. [facility]  Kelly L. Philipp Deputy, M.D.  Willis Modena, M.D., [facility]   Referring Physician Discharge [place]  DATE OF BIRTH:  Apr 20, 2032  ADMITTING PHYSICIAN:  Daisey Must, M.D.  REFERRING PHYSICIANS:  Tresa Endo L. Philipp Deputy, M.D., and Willis Modena, M.D., at [facility].  ADMISSION DIAGNOSES: 1. Exertional angina. 2. Adult onset diabetes mellitus, non-insulin dependent. 3. Hypertension. 4. Hyperlipidemia.  DISCHARGE DIAGNOSES: 1. Unstable angina with 75% left main, 50-70% left anterior descending,    80-90% left circumflex, and 95% right coronary artery stenosis with an    ejection fraction of 65%. 2. Adult onset diabetes mellitus, non-insulin dependent. 3. Hypertension. 4. Postoperative renal insufficiency. 5. Postoperative anemia. 6. Postoperative confusion. 7. Hyperlipidemia.  PROCEDURES: 1. Cardiac catheterization on Apr 08, 2002. 2. Coronary artery bypass grafting x 6 with left internal mammary artery to    the left anterior descending, saphenous vein graft to posterior descending,    posterolateral 1, and posterolateral 2 sequentially, saphenous vein graft    to the obtuse marginal, and saphenous vein graft to the diagonal on Apr 09, 2002, by Salvatore Decent. Cornelius Moras, M.D.  HISTORY OF PRESENT ILLNESS:  The patient is a 77 year old white male, a medical patient of Kelly L. Philipp Deputy, M.D., and Willis Modena, M.D., at [facility], who presents with a three-week history of angina on exertion.  He developed heaviness in his chest which radiates to his left arm.  This will resolve within a minute after he stops and rests and recurs while walking.  He was referred to [facility] and evaluated.   After reviewing the patient, it was their recommendation that he undergo cardiac catheterization to rule out coronary artery disease.  PAST MEDICAL HISTORY:  Additional medical problems include non-insulin-dependent diabetes mellitus, hypertension, hyperlipidemia, and dermatitis.  MEDICATIONS ON ADMISSION: 1. Aspirin one q.d. 2. Actos 15 mg q.d. 3. GlucoVance 2.5/500 mg one p.o. b.i.d. a.c. 4. Zocor 10 mg q.d. 5. Lopid 600 mg b.i.d. 6. Lotrel 5/10 mg one q.d.  ALLERGIES:  None known.  SOCIAL HISTORY:  The patient smokes about two cigars per month and does not use alcohol.  For further history, please see the dictated note.  HOSPITAL COURSE:  The patient was admitted and taken to the catheterization lab by Daisey Must, M.D., and underwent cardiac catheterization on the day of admission.  This showed severe three-vessel coronary artery disease with a 75% left main stenosis and normal ejection fraction.  At completion of the study, he was referred to CVTS and Salvatore Decent. Cornelius Moras, M.D.  Dr. Cornelius Moras reviewed the studies and evaluated the patient.  He agreed that he had severe three vessel coronary artery disease with new onset exertional angina.  He also had a right ICA stenosis by Doppler study.  He recommended coronary artery bypass grafting.  The risks and benefits were discussed in detail.  Informed operative consent was obtained.  The patient was subsequently taken to the operating for surgery the following day.  Preoperative studies included Doppler studies which showed a 60-80% stenosis of the right ICA.  The left ICA had no stenosis.  ABIs were greater than 1 on the right and 1.0  on the left. The patient remained stable and was taken to the OR as noted above.  He underwent coronary artery bypass grafting as described above.  He came off bypass without difficulty and was transferred to the ICU.  He remained hemodynamically stable with a cardiac index of 1.94 postoperatively.  On  the first postoperative morning, he was afebrile and he was in sinus rhythm.  He had been somewhat hypertensive and this was coming down.  The cardiac index was still 1.9.  He had some orthostatic changes on standing due to his decreased filling pressure.  He was treated with albumin.  Diuresis was held. He was weaned off of the Neo-Synephrine and mobilized.  He made slow steady progress over the next 24 hours and was subsequently transferred to the floor. There he was started on phase I cardiac rehabilitation.  After transfer to the floor, he was markedly confused and required sitters.  He also was anemic. His hemoglobin dropped to 7.5 with a hematocrit of 21.  Platelets were 88,000. The white count was normal at 4.7.  The electrolytes were normal.  The BUN was 34 with a creatinine of 1.7.  His creatinine had been normal preoperatively and after catheterization on the first postoperative day was 1.5.  On the second postoperative day, it was up to 1.8.  It dropped to 1.7 the following day and has continued to come down.  On Apr 13, 2002, the creatinine was 1.1 with a BUN of 35.  He has been restarted on his oral diabetic medicines, but he has not been started on the Glucophage due to his elevated creatinine.  It is down to 1.5 as of Apr 14, 2002, from a high of 1.8.  We plan to start him back on Diabeta.  We are going to give him 5 mg b.i.d. and hold for CBGs less than 120.  He is ambulating well.  His confusion is resolved.  His hemoglobin is stable.  His renal function is improving.  DISCHARGE MEDICATIONS:  1. Coated aspirin 325 mg q.d.  2. Tylox one to two p.o. q.4h. p.r.n. for pain.  3. Lopressor 50 mg half of a tablet p.o. b.i.d.  4. Lotrel 5/10 mg one q.d.  5. Zocor 10 mg one q.d.  6. Lopid 600 mg b.i.d.  7. Actos 15 mg q.d.  8. Diabeta 5 mg b.i.d.  Hold for sugar less than 120.   9. Lasix 40 mg q.d. x 10. 10. Potassium chloride 20 mEq one q.d. x 10. 11. Niferex 150 mg b.i.d.  with lunch and supper. 12. Multivitamins with iron one q.d.  FOLLOW-UP:  The patient will follow up with cardiology on Friday, May 01, 2002, at 11 a.m.  He will have chest x-ray done at that time.  He will return to see Salvatore Decent. Cornelius Moras, M.D., on Monday, May 04, 2002, at 11:15 a.m.  DISCHARGE ACTIVITY:  Light to moderate.  No lifting over 10 pounds.  No driving.  No strenuous activity.  WOUND CARE:  He is to clean his incisions with plain soap and water.  He may shower.  The incision staples will be removed prior to discharge.  CONDITION ON DISCHARGE:  Improving. Dictated by:   Sherrie George, P.A. Attending:  Salvatore Decent. Cornelius Moras, M.D. DD:  04/14/02 TD:  04/14/02 Job: [id number]

## 2011-11-17 ENCOUNTER — Emergency Department (HOSPITAL_COMMUNITY)
Admission: EM | Admit: 2011-11-17 | Discharge: 2011-11-18 | Disposition: A | Discharge status: Home / Self Care | Payer: Medicare Other | Attending: Emergency Medicine | Admitting: Emergency Medicine

## 2011-11-17 ENCOUNTER — Emergency Department (HOSPITAL_COMMUNITY): Payer: Medicare Other

## 2011-11-17 ENCOUNTER — Encounter (HOSPITAL_COMMUNITY): Payer: Self-pay | Admitting: Emergency Medicine

## 2011-11-17 DIAGNOSIS — I25810 Atherosclerosis of coronary artery bypass graft(s) without angina pectoris: Secondary | ICD-10-CM | POA: Insufficient documentation

## 2011-11-17 DIAGNOSIS — E119 Type 2 diabetes mellitus without complications: Secondary | ICD-10-CM | POA: Insufficient documentation

## 2011-11-17 DIAGNOSIS — I10 Essential (primary) hypertension: Secondary | ICD-10-CM | POA: Insufficient documentation

## 2011-11-17 DIAGNOSIS — R509 Fever, unspecified: Secondary | ICD-10-CM | POA: Insufficient documentation

## 2011-11-17 DIAGNOSIS — R5381 Other malaise: Secondary | ICD-10-CM | POA: Insufficient documentation

## 2011-11-17 DIAGNOSIS — Z794 Long term (current) use of insulin: Secondary | ICD-10-CM | POA: Insufficient documentation

## 2011-11-17 DIAGNOSIS — F29 Unspecified psychosis not due to a substance or known physiological condition: Secondary | ICD-10-CM | POA: Insufficient documentation

## 2011-11-17 LAB — BASIC METABOLIC PANEL
BUN: 28 mg/dL — ABNORMAL HIGH (ref 6–23)
Calcium: 8.7 mg/dL (ref 8.4–10.5)
GFR calc Af Amer: 34 mL/min — ABNORMAL LOW (ref >90)
GFR calc non Af Amer: 30 mL/min — ABNORMAL LOW (ref >90)
Glucose, Bld: 115 mg/dL — ABNORMAL HIGH (ref 70–99)
Sodium: 134 mEq/L — ABNORMAL LOW (ref 135–145)

## 2011-11-17 LAB — DIFFERENTIAL
Basophils Relative: 0 % (ref 0–1)
Eosinophils Absolute: 0.1 10*3/uL (ref 0.0–0.7)
Eosinophils Relative: 1 % (ref 0–5)
Lymphs Abs: 1.2 10*3/uL (ref 0.7–4.0)

## 2011-11-17 LAB — URINALYSIS, ROUTINE W REFLEX MICROSCOPIC
Bilirubin Urine: NEGATIVE
Hgb urine dipstick: NEGATIVE
Protein, ur: NEGATIVE mg/dL
Urobilinogen, UA: 0.2 mg/dL (ref 0.0–1.0)

## 2011-11-17 LAB — CBC
MCH: 30.2 pg (ref 26.0–34.0)
MCHC: 33.3 g/dL (ref 30.0–36.0)
MCV: 90.5 fL (ref 78.0–100.0)
Platelets: 159 10*3/uL (ref 150–400)
RBC: 3.78 MIL/uL — ABNORMAL LOW (ref 4.22–5.81)

## 2011-11-17 MED ORDER — ACETAMINOPHEN 325 MG PO TABS
650.0000 mg | ORAL_TABLET | Freq: Once | ORAL | Status: AC
  Administered 2011-11-17: 650 mg via ORAL
  Filled 2011-11-17: qty 2

## 2011-11-17 MED ORDER — SODIUM CHLORIDE 0.9 % IV BOLUS (SEPSIS)
1000.0000 mL | Freq: Once | INTRAVENOUS | Status: AC
  Administered 2011-11-17: 1000 mL via INTRAVENOUS

## 2011-11-17 NOTE — ED Notes (Signed)
Report called back to [facility]. Ptar called for transport back to facility.

## 2011-11-17 NOTE — ED Notes (Signed)
ZOX:[id number]<AV> Expected date:11/17/11<BR> Expected time: 8:42 PM<BR> Means of arrival:Ambulance<BR> Comments:<BR> M80 - 80yoM unsteady on feet, fever 101

## 2011-11-17 NOTE — ED Notes (Signed)
Patient transported to X-ray 

## 2011-11-17 NOTE — ED Provider Notes (Signed)
History     CSN: [id number]  Arrival date & time 11/17/11  2048   First MD Initiated Contact with Patient 11/17/11 2102      Chief Complaint  Patient presents with  . Fever  . Weakness    (Consider location/radiation/quality/duration/timing/severity/associated sxs/prior treatment) HPI Pt sent from [facility]. Staff stated pt has fever today and weakness. Pt normally ambulates, but has been unsteady on feet today. Pt a/o x 3 per EMS.  Past Medical History  Diagnosis Date  . Diabetes mellitus   . Hyperlipidemia   . Hypertension   . CAD (coronary artery disease) of bypass graft     Multivessel  . Dementia     MMSE 18/30 Feb 2012 [facility])  . PVD (peripheral vascular disease)   . Cataract     bilateral    Past Surgical History  Procedure Date  . Coronary artery bypass graft     Family History  Problem Relation Age of Onset  . Diabetes Mother   . Dementia Mother     alzheimer  . Dementia Father   . Stroke Father   . Heart disease Father   . Dementia Sister     History  Substance Use Topics  . Smoking status: Former Smoker    Quit date: 11/19/1948  . Smokeless tobacco: Never Used  . Alcohol Use: No      Review of Systems  All other systems reviewed and are negative.    Allergies  Review of patient's allergies indicates no known allergies.  Home Medications   Current Outpatient Rx  Name Route Sig Dispense Refill  . ASPIRIN 81 MG PO TABS Oral Take 81 mg by mouth daily.      Marland Kitchen CARVEDILOL 3.125 MG PO TABS Oral Take 6.25 mg by mouth 2 (two) times daily with meals.     Marland Kitchen VITAMIN D 1000 UNITS PO TABS Oral Take 1,000 Units by mouth daily.      Marland Kitchen DIVALPROEX SODIUM 250 MG PO TBEC Oral Take 250 mg by mouth 2 (two) times daily.      . DONEPEZIL HCL 10 MG PO TABS Oral Take 10 mg by mouth at bedtime.      . ENALAPRIL MALEATE 2.5 MG PO TABS Oral Take 2.5 mg by mouth daily.      . FENOFIBRATE 48 MG PO TABS Oral Take 48 mg by mouth at bedtime.      Marland Kitchen  FERROUS FUMARATE 325 (106 FE) MG PO TABS Oral Take 1 tablet by mouth.      . FUROSEMIDE 20 MG PO TABS Oral Take 20 mg by mouth daily.      . INSULIN ASPART 100 UNIT/ML [place] SOLN Subcutaneous Inject 8 Units into the skin 3 (three) times daily before meals. Hold if 80 or less. And if patient does not eat.    . INSULIN GLARGINE 100 UNIT/ML [place] SOLN Subcutaneous Inject 68 Units into the skin daily. Hold if blood sugar is less than 70    . ROSUVASTATIN CALCIUM 20 MG PO TABS Oral Take 20 mg by mouth at bedtime.        BP 170/44  Pulse 85  Temp(Src) 100 F (37.8 C) (Oral)  Resp 36  SpO2 96%  Physical Exam  Nursing note and vitals reviewed. Constitutional: He is oriented to person, place, and time. He appears well-developed and well-nourished. No distress.  HENT:  Head: Normocephalic and atraumatic.  Eyes: Pupils are equal, round, and reactive to light.  Neck:  Normal range of motion.  Cardiovascular: Normal rate and intact distal pulses.   Pulmonary/Chest: No respiratory distress. He has no wheezes. He has no rales.  Abdominal: Normal appearance. He exhibits no distension. There is no tenderness.  Musculoskeletal: Normal range of motion.  Lymphadenopathy:    He has no cervical adenopathy.  Neurological: He is alert and oriented to person, place, and time. No cranial nerve deficit.  Skin: Skin is warm and dry. No rash noted.  Psychiatric: He has a normal mood and affect. His behavior is normal.    ED Course  Procedures (including critical care time)  Labs Reviewed  CBC - Abnormal; Notable for the following:    RBC 3.78 (*)    Hemoglobin 11.4 (*)    HCT 34.2 (*)    All other components within normal limits  BASIC METABOLIC PANEL - Abnormal; Notable for the following:    Sodium 134 (*)    Glucose, Bld 115 (*)    BUN 28 (*)    Creatinine, Ser 2.04 (*)    GFR calc non Af Amer 30 (*)    GFR calc Af Amer 34 (*)    All other components within normal limits  DIFFERENTIAL  URINALYSIS,  ROUTINE W REFLEX MICROSCOPIC   Dg Chest 2 View  11/17/2011  *RADIOLOGY REPORT*  Clinical Data: Confusion.  Fever.  Diabetes with prior median sternotomy.  CHEST - 2 VIEW  Comparison: 12/25/2010  Findings: Prior median sternotomy.  The inferior 2 wires are fractured.  The descending aorta is densely calcified on the lateral view.  Midline trachea.  Mild cardiomegaly. No pleural effusion or pneumothorax.  On the frontal view, vague increased density over the left heart border is favored to represent mild atelectasis.  No correlate on the lateral view.  IMPRESSION:  1.  Cardiomegaly without congestive failure. 2.  Favor mild atelectasis overlying the left heart border on the frontal view; no correlate on the lateral.  If ongoing suspicion of pneumonia, consider short-term radiographic follow-up.  Original Report Authenticated By: Consuello Bossier, M.D.     1. Fever       MDM  At this time labs are unremarkable and patient is looking good.  We'll discharge back to care facility instructions to treat fever I suspect a viral illness        Nelia Shi, MD 11/17/11 2303

## 2011-11-17 NOTE — ED Notes (Signed)
MD at bedside. 

## 2011-11-17 NOTE — ED Notes (Signed)
Pt sent from [facility]. Staff stated pt has fever today and weakness. Pt normally ambulates, but has been unsteady on feet today. Pt a/o x 3 per EMS.

## 2011-11-18 NOTE — ED Notes (Signed)
Pt taken by [facility] back to the SNF

## 2012-02-06 IMAGING — CR DG FOOT COMPLETE 3+V*L*
3 series · 3 of 3 positions shown · non-contrast
Comparison: None.

CLINICAL DATA: Evaluate for osteomyelitis around the second digit.
Redness and swelling.  Diabetic.

LEFT FOOT - COMPLETE 3+ VIEW

[t foot ap left]
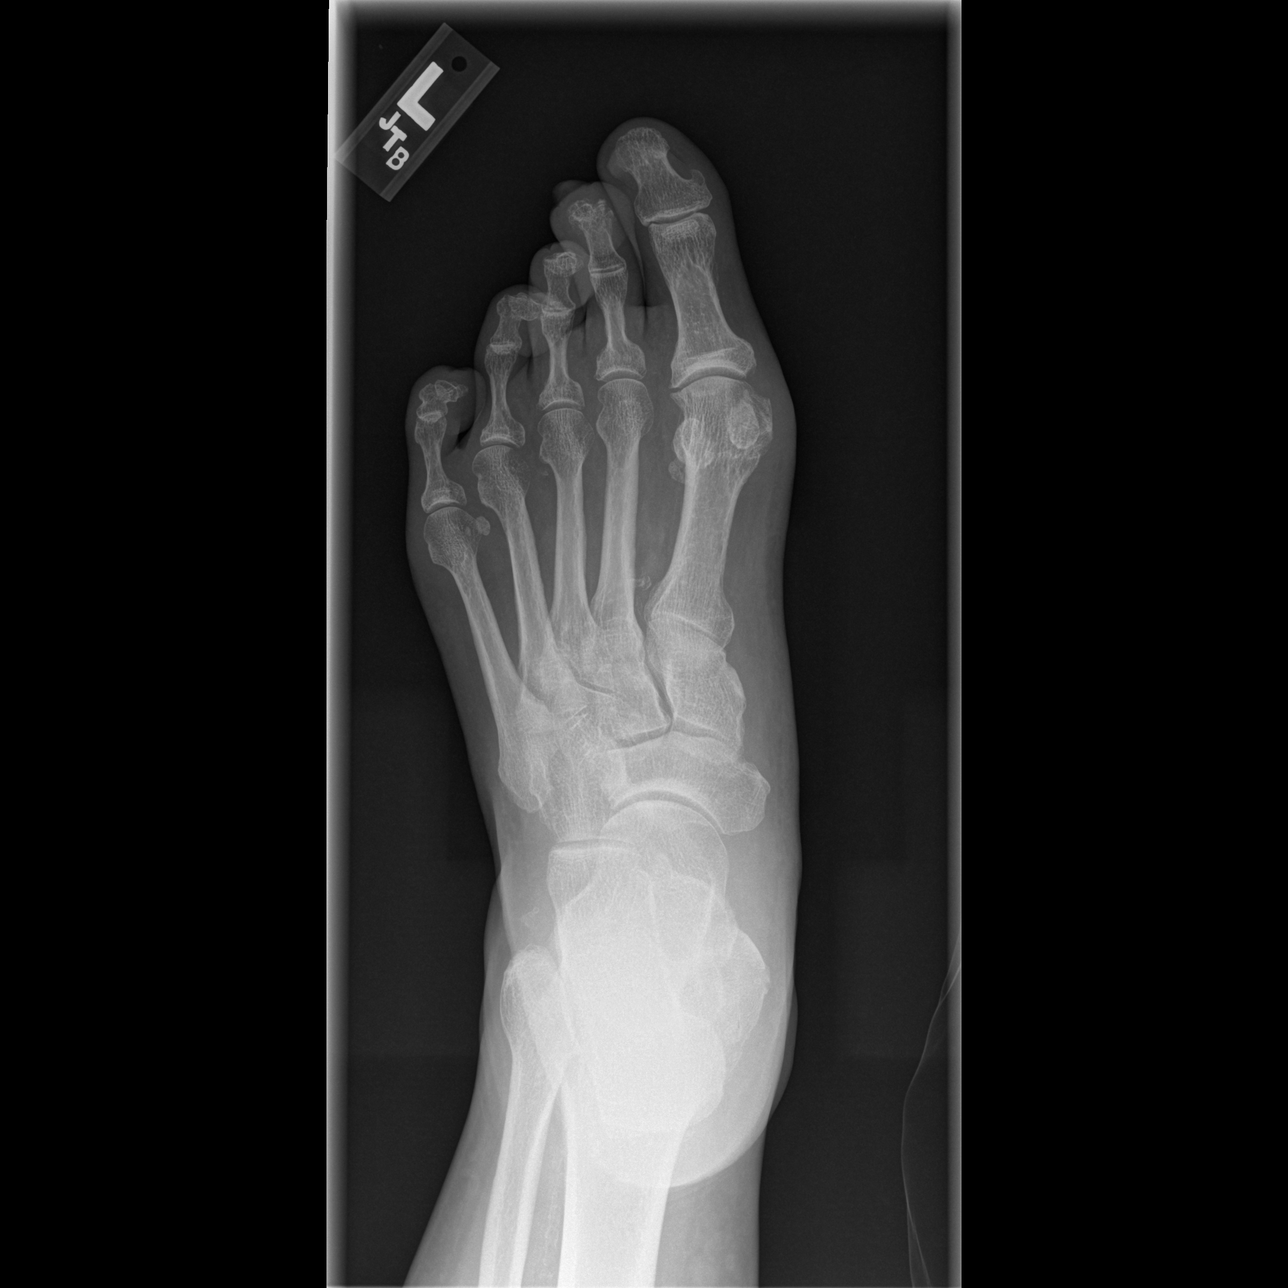

[t foot oblique left]
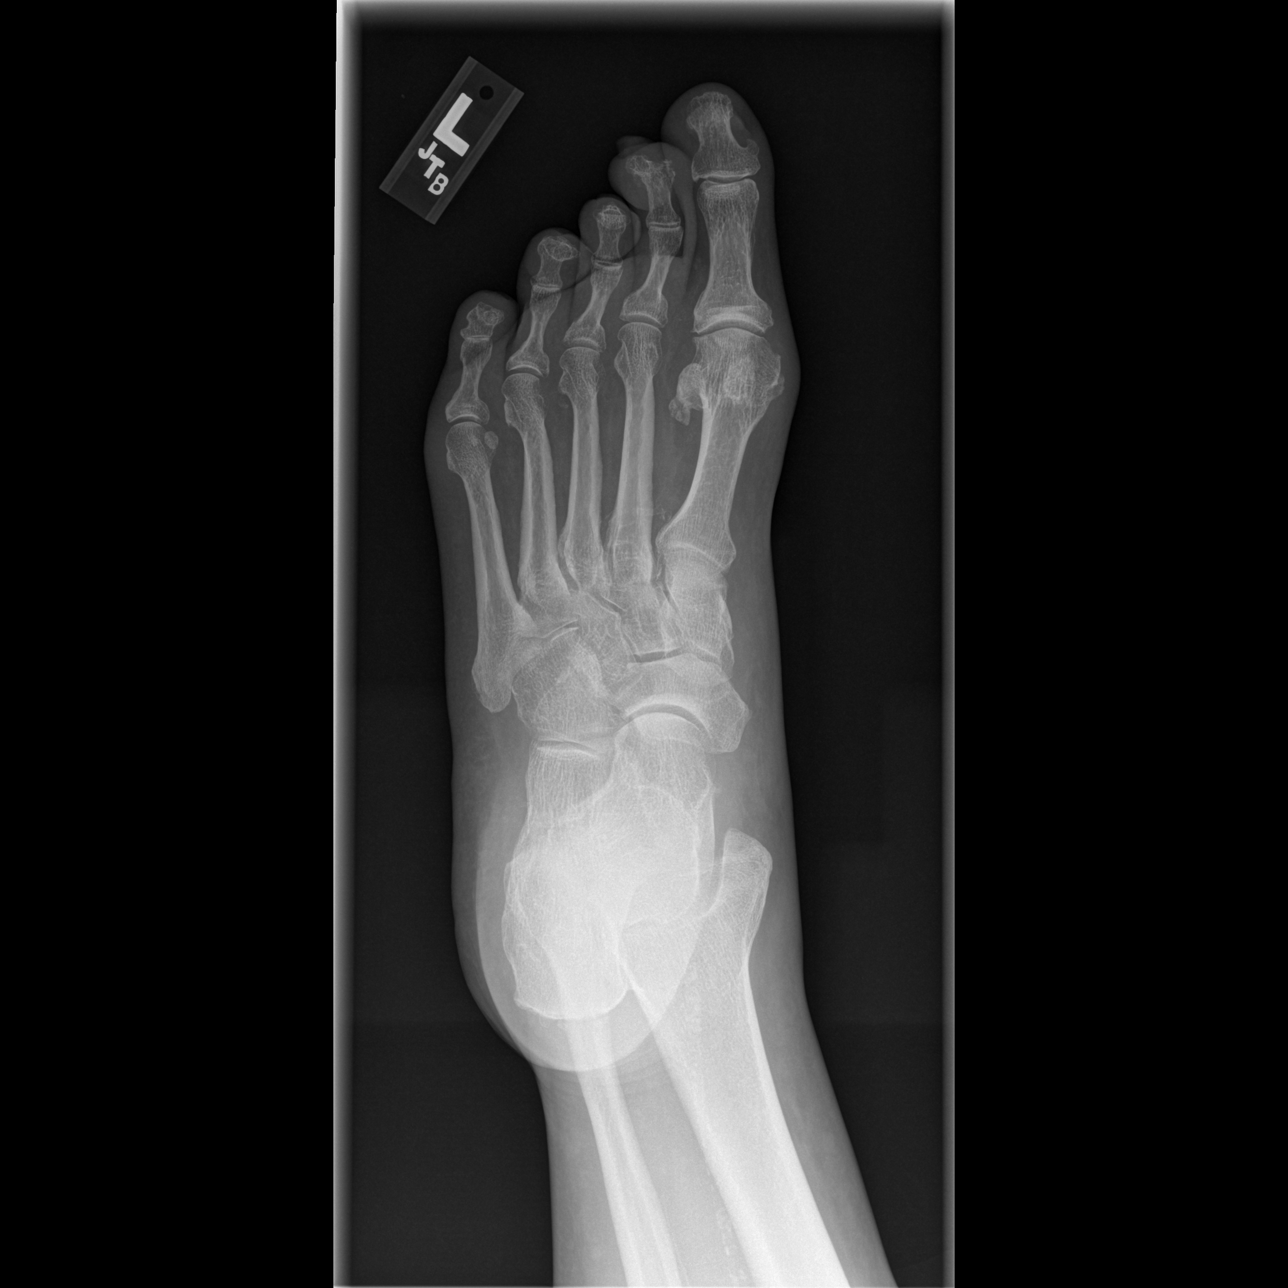

[t foot lat left]
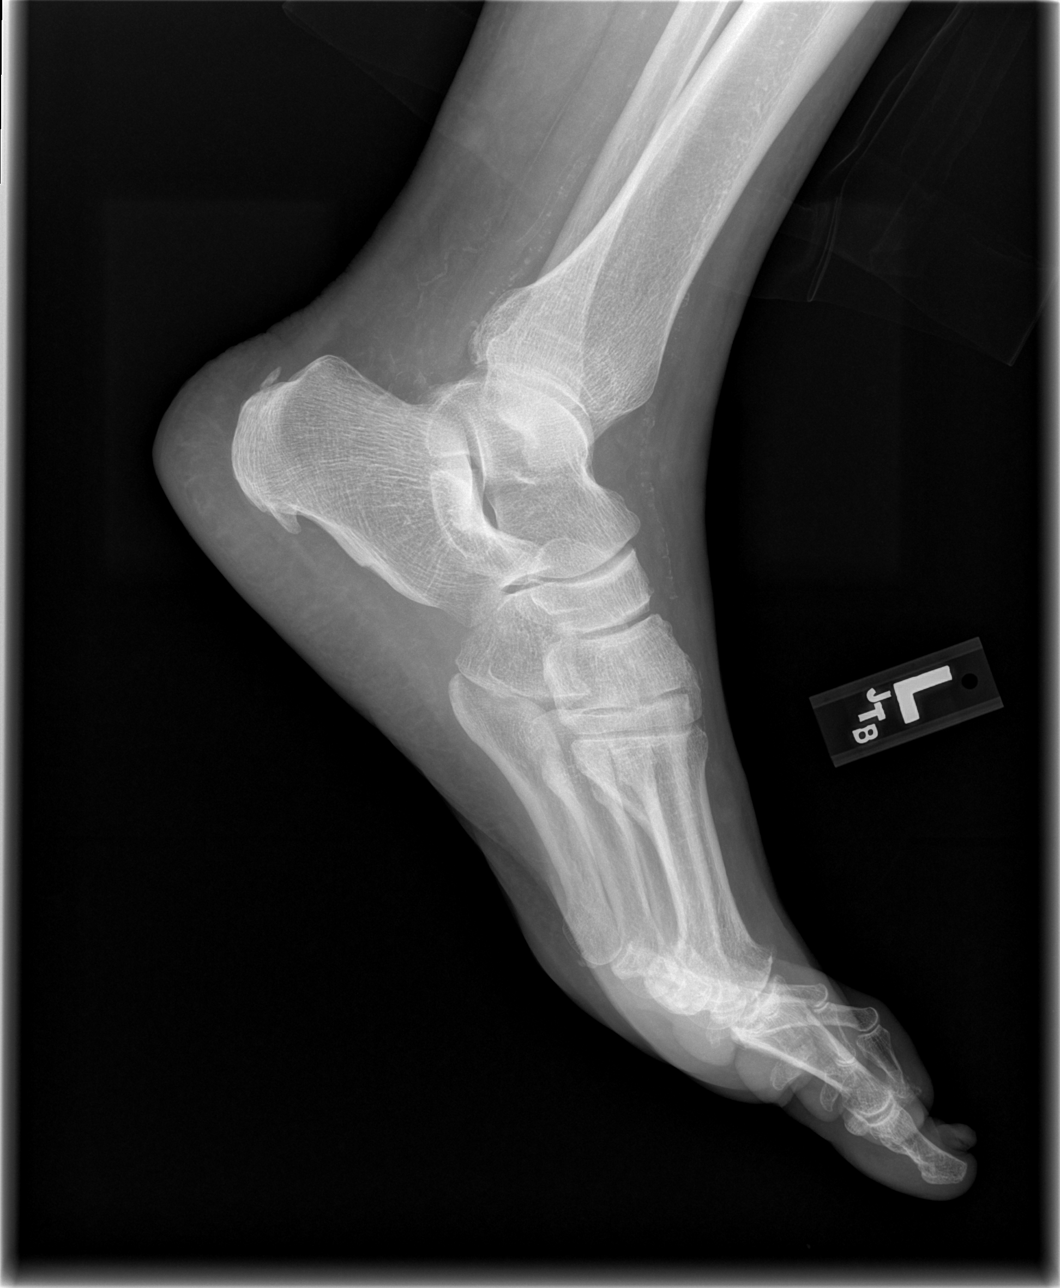

[3 of 3 positions shown; findings below may reference images not displayed]

FINDINGS: The second through fifth toes are flexed.  This limits
evaluation of the interphalangeal joints of the toes.  On the
oblique view of the left foot, there is irregularity, with
suggestion of some fragmentation of the second toe, at the level of
the interphalangeal joint. There is also some faint ossification
along the lateral aspect of the toe which may reflect periosteal
reaction.  The joint space is not well defined, and the cortical
margins at the level of the interphalangeal joint are indistinct.

 There is soft tissue swelling about the second toe. No soft tissue
gas is seen.

Peripheral vascular calcifications are identified, consistent with
history of diabetes.
IMPRESSION: 1.  Findings worrisome for osteomyelitis of the second toe at about
the interphalangeal joint.
2.  Peripheral vascular disease.

## 2013-11-20 ENCOUNTER — Encounter (HOSPITAL_COMMUNITY): Payer: Self-pay | Admitting: Emergency Medicine

## 2013-11-20 ENCOUNTER — Emergency Department (HOSPITAL_COMMUNITY): Payer: Medicare Other

## 2013-11-20 ENCOUNTER — Emergency Department (HOSPITAL_COMMUNITY)
Admission: EM | Admit: 2013-11-20 | Discharge: 2013-11-20 | Disposition: A | Discharge status: Skilled Nursing Facility | Payer: Medicare Other | Attending: Emergency Medicine | Admitting: Emergency Medicine

## 2013-11-20 DIAGNOSIS — E119 Type 2 diabetes mellitus without complications: Secondary | ICD-10-CM | POA: Diagnosis not present

## 2013-11-20 DIAGNOSIS — Z87891 Personal history of nicotine dependence: Secondary | ICD-10-CM | POA: Insufficient documentation

## 2013-11-20 DIAGNOSIS — I2510 Atherosclerotic heart disease of native coronary artery without angina pectoris: Secondary | ICD-10-CM | POA: Insufficient documentation

## 2013-11-20 DIAGNOSIS — F0391 Unspecified dementia with behavioral disturbance: Secondary | ICD-10-CM | POA: Insufficient documentation

## 2013-11-20 DIAGNOSIS — Z79899 Other long term (current) drug therapy: Secondary | ICD-10-CM | POA: Insufficient documentation

## 2013-11-20 DIAGNOSIS — F29 Unspecified psychosis not due to a substance or known physiological condition: Secondary | ICD-10-CM | POA: Insufficient documentation

## 2013-11-20 DIAGNOSIS — I10 Essential (primary) hypertension: Secondary | ICD-10-CM

## 2013-11-20 DIAGNOSIS — F03918 Unspecified dementia, unspecified severity, with other behavioral disturbance: Secondary | ICD-10-CM | POA: Insufficient documentation

## 2013-11-20 DIAGNOSIS — Z794 Long term (current) use of insulin: Secondary | ICD-10-CM | POA: Diagnosis not present

## 2013-11-20 DIAGNOSIS — R4182 Altered mental status, unspecified: Secondary | ICD-10-CM | POA: Diagnosis present

## 2013-11-20 DIAGNOSIS — H269 Unspecified cataract: Secondary | ICD-10-CM | POA: Diagnosis not present

## 2013-11-20 DIAGNOSIS — Z7982 Long term (current) use of aspirin: Secondary | ICD-10-CM | POA: Diagnosis not present

## 2013-11-20 DIAGNOSIS — E785 Hyperlipidemia, unspecified: Secondary | ICD-10-CM | POA: Insufficient documentation

## 2013-11-20 DIAGNOSIS — Z951 Presence of aortocoronary bypass graft: Secondary | ICD-10-CM | POA: Diagnosis not present

## 2013-11-20 DIAGNOSIS — F0390 Unspecified dementia without behavioral disturbance: Secondary | ICD-10-CM

## 2013-11-20 LAB — COMPREHENSIVE METABOLIC PANEL
ALBUMIN: 3.5 g/dL (ref 3.5–5.2)
ALK PHOS: 29 U/L — AB (ref 39–117)
ALT: 31 U/L (ref 0–53)
AST: 47 U/L — ABNORMAL HIGH (ref 0–37)
BILIRUBIN TOTAL: 0.4 mg/dL (ref 0.3–1.2)
BUN: 42 mg/dL — AB (ref 6–23)
CHLORIDE: 102 meq/L (ref 96–112)
CO2: 24 meq/L (ref 19–32)
CREATININE: 2.21 mg/dL — AB (ref 0.50–1.35)
Calcium: 9.1 mg/dL (ref 8.4–10.5)
GFR calc Af Amer: 31 mL/min — ABNORMAL LOW (ref >90)
GFR, EST NON AFRICAN AMERICAN: 27 mL/min — AB (ref >90)
GLUCOSE: 170 mg/dL — AB (ref 70–99)
POTASSIUM: 5.9 meq/L — AB (ref 3.7–5.3)
Sodium: 138 mEq/L (ref 137–147)
Total Protein: 6.9 g/dL (ref 6.0–8.3)

## 2013-11-20 LAB — CBC WITH DIFFERENTIAL/PLATELET
Basophils Absolute: 0 10*3/uL (ref 0.0–0.1)
Basophils Relative: 1 % (ref 0–1)
Eosinophils Absolute: 0.2 10*3/uL (ref 0.0–0.7)
Eosinophils Relative: 3 % (ref 0–5)
HEMATOCRIT: 36.1 % — AB (ref 39.0–52.0)
HEMOGLOBIN: 12 g/dL — AB (ref 13.0–17.0)
LYMPHS ABS: 2 10*3/uL (ref 0.7–4.0)
LYMPHS PCT: 35 % (ref 12–46)
MCH: 31.4 pg (ref 26.0–34.0)
MCHC: 33.2 g/dL (ref 30.0–36.0)
MCV: 94.5 fL (ref 78.0–100.0)
MONO ABS: 0.6 10*3/uL (ref 0.1–1.0)
MONOS PCT: 9 % (ref 3–12)
NEUTROS ABS: 3.1 10*3/uL (ref 1.7–7.7)
NEUTROS PCT: 53 % (ref 43–77)
Platelets: 183 10*3/uL (ref 150–400)
RBC: 3.82 MIL/uL — AB (ref 4.22–5.81)
RDW: 13.9 % (ref 11.5–15.5)
WBC: 5.9 10*3/uL (ref 4.0–10.5)

## 2013-11-20 LAB — URINALYSIS, ROUTINE W REFLEX MICROSCOPIC
Bilirubin Urine: NEGATIVE
GLUCOSE, UA: NEGATIVE mg/dL
HGB URINE DIPSTICK: NEGATIVE
KETONES UR: NEGATIVE mg/dL
LEUKOCYTES UA: NEGATIVE
Nitrite: NEGATIVE
PROTEIN: NEGATIVE mg/dL
Specific Gravity, Urine: 1.013 (ref 1.005–1.030)
UROBILINOGEN UA: 1 mg/dL (ref 0.0–1.0)
pH: 6.5 (ref 5.0–8.0)

## 2013-11-20 LAB — POTASSIUM: POTASSIUM: 5.3 meq/L (ref 3.7–5.3)

## 2013-11-20 NOTE — ED Provider Notes (Addendum)
CSN: [id number]     Arrival date & time 11/20/13  1058 History   First MD Initiated Contact with Patient 11/20/13 1103     Chief Complaint  Patient presents with  . Altered Mental Status   (Consider location/radiation/quality/duration/timing/severity/associated sxs/prior Treatment) Patient is a 80 y.o. male presenting with altered mental status. The history is provided by the nursing home (the pt has dementia and was sent here for more confusion).  Altered Mental Status Presenting symptoms: no lethargy   Severity:  Moderate Most recent episode:  Today Episode history:  Continuous Timing:  Constant Progression:  Worsening Chronicity:  Chronic Context: not alcohol use   Associated symptoms: no abdominal pain, no hallucinations, no headaches, no rash and no seizures     Past Medical History  Diagnosis Date  . Diabetes mellitus   . Hyperlipidemia   . Hypertension   . CAD (coronary artery disease) of bypass graft     Multivessel  . Dementia     MMSE 18/30 Feb 2012 [facility])  . PVD (peripheral vascular disease)   . Cataract     bilateral   Past Surgical History  Procedure Laterality Date  . Coronary artery bypass graft     Family History  Problem Relation Age of Onset  . Diabetes Mother   . Dementia Mother     alzheimer  . Dementia Father   . Stroke Father   . Heart disease Father   . Dementia Sister    History  Substance Use Topics  . Smoking status: Former Smoker    Quit date: 11/19/1948  . Smokeless tobacco: Never Used  . Alcohol Use: No    Review of Systems  Constitutional: Negative for appetite change and fatigue.  HENT: Negative for congestion, ear discharge and sinus pressure.   Eyes: Negative for discharge.  Respiratory: Negative for cough.   Cardiovascular: Negative for chest pain.  Gastrointestinal: Negative for abdominal pain and diarrhea.  Genitourinary: Negative for frequency and hematuria.  Musculoskeletal: Negative for back pain.  Skin:  Negative for rash.  Neurological: Negative for seizures and headaches.  Psychiatric/Behavioral: Negative for hallucinations.    Allergies  Review of patient's allergies indicates no known allergies.  Home Medications   Current Outpatient Rx  Name  Route  Sig  Dispense  Refill  . aspirin 81 MG tablet   Oral   Take 81 mg by mouth daily.           Marland Kitchen atorvastatin (LIPITOR) 20 MG tablet   Oral   Take 20 mg by mouth at bedtime.         . carvedilol (COREG) 3.125 MG tablet   Oral   Take 6.25 mg by mouth 2 (two) times daily with meals.          . cholecalciferol (VITAMIN D) 1000 UNITS tablet   Oral   Take 1,000 Units by mouth daily.           . divalproex (DEPAKOTE) 250 MG EC tablet   Oral   Take 250 mg by mouth 2 (two) times daily.           Marland Kitchen donepezil (ARICEPT) 10 MG tablet   Oral   Take 10 mg by mouth at bedtime.           . enalapril (VASOTEC) 2.5 MG tablet   Oral   Take 2.5 mg by mouth daily.           . fenofibrate (TRICOR) 145 MG tablet  Oral   Take 145 mg by mouth daily.         . ferrous sulfate 325 (65 FE) MG tablet   Oral   Take 325 mg by mouth daily with breakfast.         . furosemide (LASIX) 20 MG tablet   Oral   Take 20 mg by mouth daily.           . insulin aspart (NOVOLOG) 100 UNIT/ML injection   Subcutaneous   Inject 8 Units into the skin 3 (three) times daily before meals. Hold if 80 or less. And if patient does not eat.         . insulin glargine (LANTUS) 100 UNIT/ML injection   Subcutaneous   Inject 70 Units into the skin daily. Hold if blood sugar is less than 70         . LORazepam (ATIVAN) 0.5 MG tablet   Oral   Take 0.5 mg by mouth 2 (two) times daily.         . Menthol-Zinc Oxide (CALMOSEPTINE) 0.44-20.6 % OINT   Apply externally   Apply 1 application topically 2 (two) times daily.         Marland Kitchen nystatin cream (MYCOSTATIN)   Topical   Apply 1 application topically 2 (two) times daily.         .  risperiDONE (RISPERDAL) 1 MG/ML oral solution   Oral   Take 0.5 mg by mouth daily.         . rivastigmine (EXELON) 4.6 mg/24hr   Transdermal   Place 4.6 mg onto the skin daily.          BP 185/57  Pulse 66  Temp(Src) 98.6 F (37 C) (Oral)  Resp 22  SpO2 97% Physical Exam  Constitutional: He appears well-developed.  HENT:  Head: Normocephalic.  Eyes: Conjunctivae and EOM are normal. No scleral icterus.  Neck: Neck supple. No thyromegaly present.  Cardiovascular: Normal rate and regular rhythm.  Exam reveals no gallop and no friction rub.   No murmur heard. Pulmonary/Chest: No stridor. He has no wheezes. He has no rales. He exhibits no tenderness.  Abdominal: He exhibits no distension. There is no tenderness. There is no rebound.  Musculoskeletal: Normal range of motion. He exhibits no edema.  Lymphadenopathy:    He has no cervical adenopathy.  Neurological: He is alert. He exhibits normal muscle tone. Coordination normal.  Pt alert and oriented to person only  Skin: No rash noted. No erythema.  Psychiatric: He has a normal mood and affect. His behavior is normal.    ED Course  Procedures (including critical care time) Labs Review Labs Reviewed  CBC WITH DIFFERENTIAL - Abnormal; Notable for the following:    RBC 3.82 (*)    Hemoglobin 12.0 (*)    HCT 36.1 (*)    All other components within normal limits  COMPREHENSIVE METABOLIC PANEL - Abnormal; Notable for the following:    Potassium 5.9 (*)    Glucose, Bld 170 (*)    BUN 42 (*)    Creatinine, Ser 2.21 (*)    AST 47 (*)    Alkaline Phosphatase 29 (*)    GFR calc non Af Amer 27 (*)    GFR calc Af Amer 31 (*)    All other components within normal limits  URINALYSIS, ROUTINE W REFLEX MICROSCOPIC  POTASSIUM   Imaging Review Ct Head Wo Contrast  11/20/2013   CLINICAL DATA:  Altered mental status.  EXAM:  CT HEAD WITHOUT CONTRAST  TECHNIQUE: Contiguous axial images were obtained from the base of the skull through  the vertex without intravenous contrast.  COMPARISON:  CT scan of December 25, 2010.  FINDINGS: Bony calvarium is intact. Diffuse cortical atrophy is noted. Chronic ischemic white matter disease is noted. No mass effect or midline shift is noted. Ventricular size is within normal limits. There is no evidence of mass lesion, hemorrhage or acute infarction.  IMPRESSION: Diffuse cortical atrophy. Chronic ischemic white matter disease. No acute intracranial abnormality seen.   Electronically Signed   By: Roque LiasJames  Green M.D.   On: 11/20/2013 12:13   Dg Chest Port 1 View  11/20/2013   CLINICAL DATA:  Altered mental status.  Weakness.  EXAM: PORTABLE CHEST - 1 VIEW  COMPARISON:  11/17/2011.  FINDINGS: Post CABG.  Cardiomegaly.  Central pulmonary vascular prominence.  No segmental consolidation, gross pneumothorax or frank pulmonary edema.  Prominence of the mediastinum may be related to the AP technique limiting evaluation for detection of underlying mass.  IMPRESSION: Post CABG.  Cardiomegaly.  No segmental consolidation, gross pneumothorax or frank pulmonary edema.  Prominence of the mediastinum may be related to the AP technique limiting evaluation for detection of underlying mass.   Electronically Signed   By: Bridgett LarssonSteve  Olson M.D.   On: 11/20/2013 11:30    EKG Interpretation    Date/Time:  Friday November 20 2013 11:13:45 EST Ventricular Rate:  77 PR Interval:  220 QRS Duration: 112 QT Interval:  402 QTC Calculation: 455 R Axis:   65 Text Interpretation:  Sinus rhythm Prolonged PR interval Borderline intraventricular conduction delay Confirmed by Shanae Luo  MD, Barbra Miner ([id number]) on 11/20/2013 1:18:05 PM            MDM   1. Dementia        Benny LennertJoseph L Arasely Akkerman, MD 11/20/13 1426  Benny LennertJoseph L Jerzy Crotteau, MD 11/20/13 [phone]

## 2013-11-20 NOTE — Progress Notes (Signed)
CSW [facility] [address]   Catha Gosselin.Kayelynn Abdou Stewart, LCSW [phone]  ED CSW .11/20/2013 1219pm

## 2013-11-20 NOTE — ED Notes (Addendum)
Per EMS [facility] with c/o AMS. Pt has hx of dementia at baseline and is denying any complaints. Per EMS staff also reports pt is having "hard time ambulating for the last month or so"

## 2013-11-20 NOTE — Discharge Instructions (Signed)
Follow up with your md next week for recheck °

## 2013-11-20 NOTE — ED Notes (Signed)
Bed: WA25 Expected date:  Expected time:  Means of arrival:  Comments: EMS-[facility] 

## 2013-12-04 ENCOUNTER — Encounter (HOSPITAL_COMMUNITY): Payer: Self-pay | Admitting: Emergency Medicine

## 2013-12-04 ENCOUNTER — Emergency Department (HOSPITAL_COMMUNITY): Payer: Medicare Other

## 2013-12-04 ENCOUNTER — Inpatient Hospital Stay (HOSPITAL_COMMUNITY): Payer: Medicare Other

## 2013-12-04 ENCOUNTER — Inpatient Hospital Stay (HOSPITAL_COMMUNITY)
Admission: EM | Admit: 2013-12-04 | Discharge: 2013-12-09 | DRG: 071 | Disposition: A | Discharge status: Skilled Nursing Facility | Payer: Medicare Other | Attending: Internal Medicine | Admitting: Internal Medicine

## 2013-12-04 DIAGNOSIS — Z87891 Personal history of nicotine dependence: Secondary | ICD-10-CM

## 2013-12-04 DIAGNOSIS — Z79899 Other long term (current) drug therapy: Secondary | ICD-10-CM

## 2013-12-04 DIAGNOSIS — I129 Hypertensive chronic kidney disease with stage 1 through stage 4 chronic kidney disease, or unspecified chronic kidney disease: Secondary | ICD-10-CM | POA: Diagnosis present

## 2013-12-04 DIAGNOSIS — I951 Orthostatic hypotension: Secondary | ICD-10-CM | POA: Diagnosis not present

## 2013-12-04 DIAGNOSIS — I2510 Atherosclerotic heart disease of native coronary artery without angina pectoris: Secondary | ICD-10-CM | POA: Diagnosis present

## 2013-12-04 DIAGNOSIS — R269 Unspecified abnormalities of gait and mobility: Secondary | ICD-10-CM | POA: Diagnosis present

## 2013-12-04 DIAGNOSIS — E8779 Other fluid overload: Secondary | ICD-10-CM | POA: Diagnosis present

## 2013-12-04 DIAGNOSIS — E785 Hyperlipidemia, unspecified: Secondary | ICD-10-CM | POA: Diagnosis present

## 2013-12-04 DIAGNOSIS — Z951 Presence of aortocoronary bypass graft: Secondary | ICD-10-CM | POA: Diagnosis not present

## 2013-12-04 DIAGNOSIS — G9340 Encephalopathy, unspecified: Secondary | ICD-10-CM | POA: Diagnosis present

## 2013-12-04 DIAGNOSIS — E860 Dehydration: Secondary | ICD-10-CM

## 2013-12-04 DIAGNOSIS — E8770 Fluid overload, unspecified: Secondary | ICD-10-CM | POA: Diagnosis present

## 2013-12-04 DIAGNOSIS — H269 Unspecified cataract: Secondary | ICD-10-CM | POA: Diagnosis present

## 2013-12-04 DIAGNOSIS — R4182 Altered mental status, unspecified: Secondary | ICD-10-CM | POA: Diagnosis present

## 2013-12-04 DIAGNOSIS — Z7982 Long term (current) use of aspirin: Secondary | ICD-10-CM | POA: Diagnosis not present

## 2013-12-04 DIAGNOSIS — I440 Atrioventricular block, first degree: Secondary | ICD-10-CM | POA: Diagnosis not present

## 2013-12-04 DIAGNOSIS — I498 Other specified cardiac arrhythmias: Secondary | ICD-10-CM | POA: Diagnosis not present

## 2013-12-04 DIAGNOSIS — E875 Hyperkalemia: Secondary | ICD-10-CM

## 2013-12-04 DIAGNOSIS — F0390 Unspecified dementia without behavioral disturbance: Secondary | ICD-10-CM | POA: Diagnosis present

## 2013-12-04 DIAGNOSIS — I739 Peripheral vascular disease, unspecified: Secondary | ICD-10-CM | POA: Diagnosis present

## 2013-12-04 DIAGNOSIS — Z593 Problems related to living in residential institution: Secondary | ICD-10-CM

## 2013-12-04 DIAGNOSIS — E1169 Type 2 diabetes mellitus with other specified complication: Secondary | ICD-10-CM | POA: Diagnosis not present

## 2013-12-04 DIAGNOSIS — E669 Obesity, unspecified: Secondary | ICD-10-CM | POA: Diagnosis present

## 2013-12-04 DIAGNOSIS — R55 Syncope and collapse: Secondary | ICD-10-CM

## 2013-12-04 DIAGNOSIS — N183 Chronic kidney disease, stage 3 unspecified: Secondary | ICD-10-CM | POA: Diagnosis present

## 2013-12-04 DIAGNOSIS — Z794 Long term (current) use of insulin: Secondary | ICD-10-CM | POA: Diagnosis not present

## 2013-12-04 DIAGNOSIS — N179 Acute kidney failure, unspecified: Secondary | ICD-10-CM | POA: Diagnosis present

## 2013-12-04 DIAGNOSIS — Z23 Encounter for immunization: Secondary | ICD-10-CM | POA: Diagnosis not present

## 2013-12-04 DIAGNOSIS — Z789 Other specified health status: Secondary | ICD-10-CM

## 2013-12-04 DIAGNOSIS — E118 Type 2 diabetes mellitus with unspecified complications: Secondary | ICD-10-CM | POA: Diagnosis present

## 2013-12-04 DIAGNOSIS — I10 Essential (primary) hypertension: Secondary | ICD-10-CM | POA: Diagnosis present

## 2013-12-04 LAB — COMPREHENSIVE METABOLIC PANEL
ALBUMIN: 3.4 g/dL — AB (ref 3.5–5.2)
ALK PHOS: 34 U/L — AB (ref 39–117)
ALT: 41 U/L (ref 0–53)
AST: 48 U/L — AB (ref 0–37)
BILIRUBIN TOTAL: 0.4 mg/dL (ref 0.3–1.2)
BUN: 47 mg/dL — ABNORMAL HIGH (ref 6–23)
CHLORIDE: 102 meq/L (ref 96–112)
CO2: 22 mEq/L (ref 19–32)
Calcium: 8.7 mg/dL (ref 8.4–10.5)
Creatinine, Ser: 2.42 mg/dL — ABNORMAL HIGH (ref 0.50–1.35)
GFR calc Af Amer: 28 mL/min — ABNORMAL LOW (ref >90)
GFR calc non Af Amer: 24 mL/min — ABNORMAL LOW (ref >90)
Glucose, Bld: 135 mg/dL — ABNORMAL HIGH (ref 70–99)
POTASSIUM: 6 meq/L — AB (ref 3.7–5.3)
SODIUM: 138 meq/L (ref 137–147)
TOTAL PROTEIN: 6.9 g/dL (ref 6.0–8.3)

## 2013-12-04 LAB — CBC
HCT: 34.4 % — ABNORMAL LOW (ref 39.0–52.0)
Hemoglobin: 11.3 g/dL — ABNORMAL LOW (ref 13.0–17.0)
MCH: 31.8 pg (ref 26.0–34.0)
MCHC: 32.8 g/dL (ref 30.0–36.0)
MCV: 96.9 fL (ref 78.0–100.0)
PLATELETS: 148 10*3/uL — AB (ref 150–400)
RBC: 3.55 MIL/uL — ABNORMAL LOW (ref 4.22–5.81)
RDW: 14 % (ref 11.5–15.5)
WBC: 6 10*3/uL (ref 4.0–10.5)

## 2013-12-04 LAB — URINALYSIS, ROUTINE W REFLEX MICROSCOPIC
Bilirubin Urine: NEGATIVE
GLUCOSE, UA: NEGATIVE mg/dL
HGB URINE DIPSTICK: NEGATIVE
KETONES UR: NEGATIVE mg/dL
LEUKOCYTES UA: NEGATIVE
Nitrite: NEGATIVE
PH: 5 (ref 5.0–8.0)
PROTEIN: NEGATIVE mg/dL
Specific Gravity, Urine: 1.015 (ref 1.005–1.030)
Urobilinogen, UA: 0.2 mg/dL (ref 0.0–1.0)

## 2013-12-04 LAB — VALPROIC ACID LEVEL: Valproic Acid Lvl: 28 ug/mL — ABNORMAL LOW (ref 50.0–100.0)

## 2013-12-04 LAB — GLUCOSE, CAPILLARY
GLUCOSE-CAPILLARY: 148 mg/dL — AB (ref 70–99)
Glucose-Capillary: 199 mg/dL — ABNORMAL HIGH (ref 70–99)

## 2013-12-04 LAB — PRO B NATRIURETIC PEPTIDE: PRO B NATRI PEPTIDE: 442.5 pg/mL (ref 0–450)

## 2013-12-04 LAB — POCT I-STAT TROPONIN I: Troponin i, poc: 0.02 ng/mL (ref 0.00–0.08)

## 2013-12-04 LAB — LIPID PANEL
Cholesterol: 175 mg/dL (ref 0–200)
HDL: 20 mg/dL — AB (ref >39)
LDL CALC: 119 mg/dL — AB (ref 0–99)
Total CHOL/HDL Ratio: 8.8 RATIO
Triglycerides: 180 mg/dL — ABNORMAL HIGH (ref <150)
VLDL: 36 mg/dL (ref 0–40)

## 2013-12-04 LAB — POTASSIUM: Potassium: 5.9 mEq/L — ABNORMAL HIGH (ref 3.7–5.3)

## 2013-12-04 LAB — TROPONIN I: Troponin I: <0.3 ng/mL (ref <0.30)

## 2013-12-04 MED ORDER — DONEPEZIL HCL 10 MG PO TABS
10.0000 mg | ORAL_TABLET | Freq: Every day | ORAL | Status: DC
  Administered 2013-12-04 – 2013-12-08 (×4): 10 mg via ORAL
  Filled 2013-12-04 (×6): qty 1

## 2013-12-04 MED ORDER — SODIUM CHLORIDE 0.9 % IJ SOLN
3.0000 mL | Freq: Two times a day (BID) | INTRAMUSCULAR | Status: DC
  Administered 2013-12-05 – 2013-12-09 (×7): 3 mL via INTRAVENOUS

## 2013-12-04 MED ORDER — FUROSEMIDE 10 MG/ML IJ SOLN: 20.0000 mg | Freq: Once | INTRAMUSCULAR | Status: DC

## 2013-12-04 MED ORDER — FERROUS SULFATE 325 (65 FE) MG PO TABS
325.0000 mg | ORAL_TABLET | Freq: Every day | ORAL | Status: DC
  Administered 2013-12-05 – 2013-12-09 (×5): 325 mg via ORAL
  Filled 2013-12-04 (×7): qty 1

## 2013-12-04 MED ORDER — INSULIN GLARGINE 100 UNIT/ML ~~LOC~~ SOLN
35.0000 [IU] | Freq: Every day | SUBCUTANEOUS | Status: DC
  Filled 2013-12-04: qty 0.35

## 2013-12-04 MED ORDER — RISPERIDONE 1 MG/ML PO SOLN: 0.5000 mg | Freq: Every day | ORAL | Status: DC

## 2013-12-04 MED ORDER — DIVALPROEX SODIUM 250 MG PO DR TAB
250.0000 mg | DELAYED_RELEASE_TABLET | Freq: Three times a day (TID) | ORAL | Status: DC
  Administered 2013-12-04 – 2013-12-09 (×14): 250 mg via ORAL
  Filled 2013-12-04 (×16): qty 1

## 2013-12-04 MED ORDER — RIVASTIGMINE 9.5 MG/24HR TD PT24
9.5000 mg | MEDICATED_PATCH | Freq: Every day | TRANSDERMAL | Status: DC
  Filled 2013-12-04: qty 1

## 2013-12-04 MED ORDER — NYSTATIN 100000 UNIT/GM EX CREA
1.0000 "application " | TOPICAL_CREAM | Freq: Two times a day (BID) | CUTANEOUS | Status: DC
  Administered 2013-12-05 – 2013-12-09 (×7): 1 via TOPICAL
  Filled 2013-12-04 (×2): qty 15

## 2013-12-04 MED ORDER — ENALAPRIL MALEATE 2.5 MG PO TABS: 2.5000 mg | ORAL_TABLET | Freq: Every day | ORAL | Status: DC

## 2013-12-04 MED ORDER — FUROSEMIDE 10 MG/ML IJ SOLN
20.0000 mg | Freq: Once | INTRAMUSCULAR | Status: AC
  Administered 2013-12-04: 20 mg via INTRAVENOUS

## 2013-12-04 MED ORDER — ASPIRIN 81 MG PO TABS: 81.0000 mg | ORAL_TABLET | Freq: Every day | ORAL | Status: DC

## 2013-12-04 MED ORDER — SODIUM CHLORIDE 0.9 % IV SOLN
INTRAVENOUS | Status: DC
  Administered 2013-12-04: 16:00:00 via INTRAVENOUS

## 2013-12-04 MED ORDER — FUROSEMIDE 10 MG/ML IJ SOLN
INTRAMUSCULAR | Status: AC
  Filled 2013-12-04: qty 4

## 2013-12-04 MED ORDER — VITAMIN D3 25 MCG (1000 UNIT) PO TABS
1000.0000 [IU] | ORAL_TABLET | Freq: Every day | ORAL | Status: DC
Start: 2013-12-04 — End: 2013-12-09
  Administered 2013-12-04 – 2013-12-09 (×6): 1000 [IU] via ORAL
  Filled 2013-12-04 (×6): qty 1

## 2013-12-04 MED ORDER — ATORVASTATIN CALCIUM 20 MG PO TABS
20.0000 mg | ORAL_TABLET | Freq: Every day | ORAL | Status: DC
  Administered 2013-12-04 – 2013-12-08 (×4): 20 mg via ORAL
  Filled 2013-12-04 (×6): qty 1

## 2013-12-04 MED ORDER — SODIUM POLYSTYRENE SULFONATE 15 GM/60ML PO SUSP
30.0000 g | Freq: Once | ORAL | Status: DC
  Filled 2013-12-04: qty 120

## 2013-12-04 MED ORDER — FENOFIBRATE 160 MG PO TABS
160.0000 mg | ORAL_TABLET | Freq: Every day | ORAL | Status: DC
Start: 2013-12-04 — End: 2013-12-04
  Filled 2013-12-04: qty 1

## 2013-12-04 MED ORDER — CARVEDILOL 6.25 MG PO TABS
6.2500 mg | ORAL_TABLET | Freq: Two times a day (BID) | ORAL | Status: DC
  Administered 2013-12-04 – 2013-12-07 (×6): 6.25 mg via ORAL
  Filled 2013-12-04 (×8): qty 1

## 2013-12-04 MED ORDER — INSULIN ASPART 100 UNIT/ML ~~LOC~~ SOLN
0.0000 [IU] | Freq: Three times a day (TID) | SUBCUTANEOUS | Status: DC
  Administered 2013-12-05: 2 [IU] via SUBCUTANEOUS
  Administered 2013-12-05: 5 [IU] via SUBCUTANEOUS
  Administered 2013-12-06: 3 [IU] via SUBCUTANEOUS
  Administered 2013-12-06: 5 [IU] via SUBCUTANEOUS
  Administered 2013-12-06: 1 [IU] via SUBCUTANEOUS
  Administered 2013-12-07: 2 [IU] via SUBCUTANEOUS
  Administered 2013-12-07: 3 [IU] via SUBCUTANEOUS
  Administered 2013-12-07: 5 [IU] via SUBCUTANEOUS

## 2013-12-04 MED ORDER — RISPERIDONE 1 MG/ML PO SOLN
0.5000 mg | Freq: Every day | ORAL | Status: DC
  Filled 2013-12-04: qty 0.5

## 2013-12-04 MED ORDER — ASPIRIN 81 MG PO CHEW
81.0000 mg | CHEWABLE_TABLET | Freq: Every day | ORAL | Status: DC
  Administered 2013-12-05 – 2013-12-09 (×5): 81 mg via ORAL
  Filled 2013-12-04 (×5): qty 1

## 2013-12-04 MED ORDER — INSULIN GLARGINE 100 UNIT/ML ~~LOC~~ SOLN: 35.0000 [IU] | Freq: Every day | SUBCUTANEOUS | Status: DC

## 2013-12-04 MED ORDER — SODIUM CHLORIDE 0.9 % IV BOLUS (SEPSIS): 1000.0000 mL | Freq: Once | INTRAVENOUS | Status: DC

## 2013-12-04 MED ORDER — ENALAPRIL MALEATE 2.5 MG PO TABS
2.5000 mg | ORAL_TABLET | Freq: Every day | ORAL | Status: DC
  Filled 2013-12-04: qty 1

## 2013-12-04 MED ORDER — HEPARIN SODIUM (PORCINE) 5000 UNIT/ML IJ SOLN
5000.0000 [IU] | Freq: Three times a day (TID) | INTRAMUSCULAR | Status: DC
  Administered 2013-12-04 – 2013-12-09 (×12): 5000 [IU] via SUBCUTANEOUS
  Filled 2013-12-04 (×17): qty 1

## 2013-12-04 MED ORDER — RIVASTIGMINE 9.5 MG/24HR TD PT24: 9.5000 mg | MEDICATED_PATCH | Freq: Every day | TRANSDERMAL | Status: DC

## 2013-12-04 MED ORDER — CARVEDILOL 6.25 MG PO TABS
6.2500 mg | ORAL_TABLET | Freq: Two times a day (BID) | ORAL | Status: DC
  Filled 2013-12-04 (×2): qty 1

## 2013-12-04 MED ORDER — SODIUM CHLORIDE 0.9 % IV SOLN
INTRAVENOUS | Status: DC
  Administered 2013-12-04: 17:00:00 via INTRAVENOUS

## 2013-12-04 MED ORDER — SODIUM POLYSTYRENE SULFONATE 15 GM/60ML PO SUSP
15.0000 g | Freq: Once | ORAL | Status: AC
  Administered 2013-12-04: 15 g via ORAL
  Filled 2013-12-04: qty 60

## 2013-12-04 NOTE — Progress Notes (Addendum)
S: Patient is awake. Alert and oriented to self. Disoriented to place and time. Calm and cooperative. Follow simple commands and one step direction.   O:  General: NAD Neck: Unable to assess JVD due to body habitus.  Heart: RRR. No M/G/R Lungs: mild bibasilar fine raled noted. No wheezing or rhonchi Abd: round. BS x 4. NTND. Leg: 2-3 + Pitting edema B/L.  A/P  # Acute encephalopathy vs Dementia--looks better.  # Hyperkalemia in the setting of AC renal failure, AG 14   K was 6.0 on admission. No significant EKG changes noted. Received Kayexalate 15 g at ED and Lasix 20 mg IV x 1.   Plan - will check Stat BMP in 2 hours  - consider to give him more Kayexalate if K is high.  - consider more diuresis tonight if renal function is better.   # Fluid overload in the setting of AC renal failure    No c/o SOB, orthopnea or PND. Physical exam showed mild bibasilar rales with moderate LE edema. PCXR suggested early congestive heart failure.  No LE edema documented on DC summary in 2012 when he was hospitalized for Hyperglycemia. I am able to find an echo in 2012 showing EF of 60-65% with LVH and possible diastolic dysfunction. No valvular abnormality noted.  - Unsure which one is the cause and which one is the effect since we do not have his baseline weight and even baseline status available ( SNF dementia patient and family not familiar with his daily status) - Agree with low dose Lasix 20 IV x 1 followed by stat BMP  If Cr better, it may be dCHF with associated possible cardiorenal syndrome--ProBNP may not be accurate if he has dCHF.   If Cr worse, the [facility] is renal most likely the cause, and we should stop Lasix.   # AC renal failure    Patient was evaluated for a similar presentation at ED on 11/20/13. K was 5.9 along with Cr 2.21. Given high K level, this could be acute vs subacute on chronic renal failure. Unsure about his baseline renal level.      The etiology of real failure is unclear.  Medication-induced could contribute to one component of his [facility] failure since he is on multiple home meds that could cause renal insufficiency. Unclear outpatient NSAIDS use but doubt since he is a SNF patient. We should consider pre-, intra and post renal at this point.   Plan - follow up BMP after the Lasix dose.  - Strict I +O - Feurea is pending.  - monitor output--PVR if difficult urinating.   Dede QueryNa Ryliee Figge, MD PGY-3 [place] IM residency program Pager [phone]

## 2013-12-04 NOTE — ED Provider Notes (Signed)
CSN: [id number]     Arrival date & time 12/04/13  1408 History   First MD Initiated Contact with Patient 12/04/13 1419     Chief Complaint  Patient presents with  . Altered Mental Status  . Urinary Incontinence  . Encopresis   (Consider location/radiation/quality/duration/timing/severity/associated sxs/prior Treatment) The history is provided by the nursing home and medical records.   Level 5 Caveat: Dementia. This is a 80 y.o. M with PMH significant for HTN, HLP, DM, CAD, dementia presenting to the ED from [facility] living for AMS.  Pt is non-contributory to history, voices no complaints, and states he does not know why he is in the ED right now.  VS stable on arrival.  This provider personally called Assisted Living Facility-- states he appears more confused than normal, difficulty walking, and more incontinent of urine and bowel.  Only medication change is that exalon patch was d/c.  No recent falls or head trauma. No cough, cold, congestion, or fevers.  BP has been stable at facility.  Past Medical History  Diagnosis Date  . Diabetes mellitus   . Hyperlipidemia   . Hypertension   . CAD (coronary artery disease) of bypass graft     Multivessel  . Dementia     MMSE 18/30 Feb 2012 [facility])  . PVD (peripheral vascular disease)   . Cataract     bilateral   Past Surgical History  Procedure Laterality Date  . Coronary artery bypass graft     Family History  Problem Relation Age of Onset  . Diabetes Mother   . Dementia Mother     alzheimer  . Dementia Father   . Stroke Father   . Heart disease Father   . Dementia Sister    History  Substance Use Topics  . Smoking status: Former Smoker    Quit date: 11/19/1948  . Smokeless tobacco: Never Used  . Alcohol Use: No    Review of Systems  Unable to perform ROS: Dementia    Allergies  Review of patient's allergies indicates no known allergies.  Home Medications   Current Outpatient Rx  Name  Route  Sig   Dispense  Refill  . aspirin 81 MG tablet   Oral   Take 81 mg by mouth daily.           Marland Kitchen atorvastatin (LIPITOR) 20 MG tablet   Oral   Take 20 mg by mouth at bedtime.         . carvedilol (COREG) 3.125 MG tablet   Oral   Take 6.25 mg by mouth 2 (two) times daily with meals.          . cholecalciferol (VITAMIN D) 1000 UNITS tablet   Oral   Take 1,000 Units by mouth daily.           . divalproex (DEPAKOTE) 250 MG EC tablet   Oral   Take 250 mg by mouth 2 (two) times daily.           Marland Kitchen donepezil (ARICEPT) 10 MG tablet   Oral   Take 10 mg by mouth at bedtime.           . enalapril (VASOTEC) 2.5 MG tablet   Oral   Take 2.5 mg by mouth daily.           . fenofibrate (TRICOR) 145 MG tablet   Oral   Take 145 mg by mouth daily.         . ferrous sulfate 325 (  65 FE) MG tablet   Oral   Take 325 mg by mouth daily with breakfast.         . furosemide (LASIX) 20 MG tablet   Oral   Take 20 mg by mouth daily.           . insulin aspart (NOVOLOG) 100 UNIT/ML injection   Subcutaneous   Inject 8 Units into the skin 3 (three) times daily before meals. Hold if 80 or less. And if patient does not eat.         . insulin glargine (LANTUS) 100 UNIT/ML injection   Subcutaneous   Inject 70 Units into the skin daily. Hold if blood sugar is less than 70         . LORazepam (ATIVAN) 0.5 MG tablet   Oral   Take 0.5 mg by mouth 2 (two) times daily.         . Menthol-Zinc Oxide (CALMOSEPTINE) 0.44-20.6 % OINT   Apply externally   Apply 1 application topically 2 (two) times daily.         Marland Kitchen. nystatin cream (MYCOSTATIN)   Topical   Apply 1 application topically 2 (two) times daily.         . risperiDONE (RISPERDAL) 1 MG/ML oral solution   Oral   Take 0.5 mg by mouth daily.         . rivastigmine (EXELON) 4.6 mg/24hr   Transdermal   Place 4.6 mg onto the skin daily.          BP 144/43  Pulse 69  Temp(Src) 98.2 F (36.8 C) (Oral)  Resp 24  SpO2  100%  Physical Exam  Nursing note and vitals reviewed. Constitutional: He appears well-developed and well-nourished. No distress.  HENT:  Head: Normocephalic and atraumatic.  Mouth/Throat: Oropharynx is clear and moist.  Eyes: Conjunctivae and EOM are normal. Pupils are equal, round, and reactive to light.  Neck: Normal range of motion. Neck supple.  Cardiovascular: Normal rate, regular rhythm and normal heart sounds.   Pulmonary/Chest: Effort normal and breath sounds normal. No respiratory distress. He has no wheezes.  Abdominal: Soft. Bowel sounds are normal. There is no tenderness. There is no guarding.  Musculoskeletal: Normal range of motion. He exhibits no edema.  Neurological: He is alert. He displays no tremor. No cranial nerve deficit or sensory deficit. He displays no seizure activity.  Awake, oriented to self only; CN grossly intact; decreased strength of BLE but remains equal; equal grip strengths bilaterally; sensation to light touch intact diffusely; moves extremities without ataxia; no facial asymmetry appreciated  Skin: Skin is warm and dry. He is not diaphoretic.  Psychiatric: He has a normal mood and affect. His speech is normal.    ED Course  Procedures (including critical care time) Labs Review Labs Reviewed  CBC - Abnormal; Notable for the following:    RBC 3.55 (*)    Hemoglobin 11.3 (*)    HCT 34.4 (*)    Platelets 148 (*)    All other components within normal limits  COMPREHENSIVE METABOLIC PANEL - Abnormal; Notable for the following:    Potassium 6.0 (*)    Glucose, Bld 135 (*)    BUN 47 (*)    Creatinine, Ser 2.42 (*)    Albumin 3.4 (*)    AST 48 (*)    Alkaline Phosphatase 34 (*)    GFR calc non Af Amer 24 (*)    GFR calc Af Amer 28 (*)  All other components within normal limits  VALPROIC ACID LEVEL - Abnormal; Notable for the following:    Valproic Acid Lvl 28.0 (*)    All other components within normal limits  POTASSIUM - Abnormal; Notable  for the following:    Potassium 5.9 (*)    All other components within normal limits  URINALYSIS, ROUTINE W REFLEX MICROSCOPIC  POCT I-STAT TROPONIN I   Imaging Review Ct Head Wo Contrast  12/04/2013   CLINICAL DATA:  Altered mental status  EXAM: CT HEAD WITHOUT CONTRAST  TECHNIQUE: Contiguous axial images were obtained from the base of the skull through the vertex without intravenous contrast.  COMPARISON:  Noncontrast CT scan of brain dated November 20, 2013.  FINDINGS: There is stable moderate diffuse cerebral and cerebellar atrophy with compensatory ventriculomegaly. There is no evidence of an acute intracranial hemorrhage nor of an evolving ischemic infarction. Stable decreased density in the deep white matter of both cerebral hemispheres as consistent with chronic small vessel ischemic type change. The cerebellum and brainstem are normal in density.  At bone window settings the observed portions of the paranasal sinuses and mastoid air cells exhibit no air-fluid levels. There is minimal mucoperiosteal thickening within ethmoid sinus cells. There is no evidence of an acute skull fracture.  IMPRESSION: 1. There is no evidence of an acute intracranial hemorrhage nor of an evolving ischemic infarction. 2. There is no intracranial mass effect or hydrocephalus. 3. There is stable moderate diffuse cerebral and cerebellar atrophy and white matter density hypodensity consistent with chronic small vessel ischemic type change.   Electronically Signed   By: David  [place]   On: 12/04/2013 15:24    EKG Interpretation    Date/Time:  Friday December 04 2013 14:10:49 EST Ventricular Rate:  66 PR Interval:  191 QRS Duration: 115 QT Interval:  405 QTC Calculation: 424 R Axis:   46 Text Interpretation:  Age not entered, assumed to be  80 years old for purpose of ECG interpretation Sinus rhythm Incomplete right bundle branch block ST elev, probable normal early repol pattern No significant change since last  tracing Confirmed by WARD  DO, KRISTEN ([id number]) on 12/04/2013 2:39:15 PM            MDM   1. Altered mental status   2. Hyperkalemia   3. Dehydration   4. 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 Person living in residential institution   7. CAD (coronary artery disease)   8. Dementia    EKG NSR with RBBB, unchanged from previous.  Trop negative.  Labs as above-- hyperkalemia at 6.0 and slight bump in SrCr when compared with previous, likely secondary to dehydration.  CT head negative for acute findings.  Will repeat K+ and plan admission for continued observation and IV rehydration.    Repeat K+ 5.9 without EKG changes.  Will give dose of kayexalate.  Spoke with unassigned IM resident, pt will be admitted to telemetry.  Temp admission orders placed.  VS remain stable.  Garlon Hatchet, PA-C 12/04/13 [phone]

## 2013-12-04 NOTE — ED Notes (Signed)
CBG 199 

## 2013-12-04 NOTE — Progress Notes (Signed)
I received report from greg at 18:03, pt. Will not be coming up to the unit until he receives x-ray.

## 2013-12-04 NOTE — ED Notes (Signed)
Per [facility], [facility] for altered mental status changes. Per EMS, with prior run with pt he is at baseline. Staff at facility informed EMS they have had to help him more today than usual and has been incontinent of bowel and bladder. NSR on the monitor, CBG 117, 99% on RA

## 2013-12-04 NOTE — ED Notes (Signed)
Admit Doctor at bedside.  

## 2013-12-04 NOTE — ED Notes (Signed)
Admit Doctor stated ordered a Chest X-ray and to be completed prior to transport to floor. Called X-ray.

## 2013-12-04 NOTE — H&P (Signed)
Date: 12/04/2013               Patient Name:  Travis Coleman MRN: [id number]  DOB: June 30, 1932 Age / Sex: 80 y.o., male   PCP: Travis DavenportKaren L Richter, MD         Medical Service: Internal Medicine Teaching Service         Attending Physician: Dr. Burns SpainElizabeth A Butcher, MD    First Contact: Dr. Darci Needlehikowski Pager: [phone]  Second Contact: Dr. Virgina OrganQureshi Pager: [phone]       After Hours (After 5p/  First Contact Pager: [phone]  weekends / holidays): Second Contact Pager: [phone]   Chief Complaint: AMS  History of Present Illness:  This is a 80yo WM w/ PMH DM type 2, HLD, HTN, CAD s/p CABG, dementia (MMSE 18/30 2012), PVD, bilateral cataracts who [facility]  Patient was accompanied by sister at the time of our interview, though did not know much about patient's baseline. According to her, he seems slightly more confused than usual. Usually he knows his sister, though when she first saw him today in ED he did no recognize her. During our interview patient did recognize sister, he knew his name and his birthdate (after 3 tries)- did not know where he was, the year, or the president. Patient denies CP, SOB or any pain at all. During our evaluation BP 170/54, HR 65, satting 99% on room air. Sister thinks patient has slowly deteriorated over the last 2 years, though more rapidly in the last 2 months. Patient uses a walker to get around at the ALF. Patient was seen in ED for a similar episode of hyperkalemia on January 1- discharged from ED, not admitted.  Patient found to be hyperkalemic in the ED to K 6.0, repeat 5.9. He was given kayexalate 15g. Also started on NS at 125cc/hr. Cr found to be 2.42, though unknown baseline (Cr 2.21 during last ED visit on 1/2, and 2.04 on 12/29). Mild elevation in LFTs, though this was stable since his last ED visit. Hb stable at 11.3. UA negative. Head CT negative.  Of note, patient is on depakote (unknown reason), his level today was low at 28.    [facility] they sent patient over to ED for an "abnormal gait" x 1 week now requiring walker.  Meds: Current Facility-Administered Medications  Medication Dose Route Frequency Provider Last Rate Last Dose  . 0.9 %  sodium chloride infusion   Intravenous Continuous Garlon HatchetLisa M Sanders, PA-C      . 0.9 %  sodium chloride infusion   Intravenous Continuous Garlon HatchetLisa M Sanders, PA-C 125 mL/hr at 12/04/13 1727    . [START ON 12/05/2013] insulin aspart (novoLOG) injection 0-9 Units  0-9 Units Subcutaneous TID WC Darden PalmerSamaya Qureshi, MD       Current Outpatient Prescriptions  Medication Sig Dispense Refill  . aspirin 81 MG tablet Take 81 mg by mouth daily.        Marland Kitchen. atorvastatin (LIPITOR) 20 MG tablet Take 20 mg by mouth at bedtime.      . carvedilol (COREG) 3.125 MG tablet Take 6.25 mg by mouth 2 (two) times daily with meals.       . cholecalciferol (VITAMIN D) 1000 UNITS tablet Take 1,000 Units by mouth daily.        . divalproex (DEPAKOTE) 250 MG EC tablet Take 250 mg by mouth 3 (three) times daily.       Marland Kitchen. donepezil (ARICEPT) 10 MG  tablet Take 10 mg by mouth at bedtime.        . enalapril (VASOTEC) 2.5 MG tablet Take 2.5 mg by mouth daily.        . fenofibrate (TRICOR) 145 MG tablet Take 145 mg by mouth daily.      . ferrous sulfate 325 (65 FE) MG tablet Take 325 mg by mouth daily with breakfast.      . furosemide (LASIX) 20 MG tablet Take 20 mg by mouth daily.        . insulin aspart (NOVOLOG) 100 UNIT/ML injection Inject 8 Units into the skin 3 (three) times daily before meals. Hold if 80 or less. And if patient does not eat.      . insulin glargine (LANTUS) 100 UNIT/ML injection Inject 70 Units into the skin daily. Hold if blood sugar is less than 70      . LORazepam (ATIVAN) 0.5 MG tablet Take 0.5 mg by mouth 2 (two) times daily.      Marland Kitchen nystatin cream (MYCOSTATIN) Apply 1 application topically 2 (two) times daily.      . risperiDONE (RISPERDAL) 1 MG/ML oral solution Take 0.5 mg by mouth  daily.      . rivastigmine (EXELON) 9.5 mg/24hr Place 9.5 mg onto the skin daily.      not sure what the depakote and risperdal are for  Allergies: Allergies as of 12/04/2013  . (No Known Allergies)   Past Medical History  Diagnosis Date  . Diabetes mellitus   . Hyperlipidemia   . Hypertension   . CAD (coronary artery disease) of bypass graft     Multivessel  . Dementia     MMSE 18/30 Feb 2012 [facility])  . PVD (peripheral vascular disease)   . Cataract     bilateral   Past Surgical History  Procedure Laterality Date  . Coronary artery bypass graft     Family History  Problem Relation Age of Onset  . Diabetes Mother   . Dementia Mother     alzheimer  . Dementia Father   . Stroke Father   . Heart disease Father   . Dementia Sister    History   Social History  . Marital Status: Single    Spouse Name: N/A    Number of Children: N/A  . Years of Education: N/A   Occupational History  . Not on file.   Social History Main Topics  . Smoking status: Former Smoker    Quit date: 11/19/1948  . Smokeless tobacco: Never Used  . Alcohol Use: No  . Drug Use: No  . Sexual Activity: Not Currently   Other Topics Concern  . Not on file   Social History Narrative  . No narrative on file    Review of Systems: ROS Not able to assess.   Physical Exam: Blood pressure 161/58, pulse 72, temperature 98.2 F (36.8 C), temperature source Oral, resp. rate 17, SpO2 99.00%. General: lying in bed slumped over, alert and talkative, able to follow some commands and answer some questions HEENT: pupils equal round and reactive to light, vision grossly intact, face looks somewhat swollen, though unknown baseline; patient refused to open mouth Neck: supple Lungs: patient wouldn't sit up, but anterior lung fields were clear to ascultation bilaterally, normal work of respiration, no wheezes, rales, ronchi- patient got agitated when we put him flat, though did not seem to be in  respiratory distress and O2 sat good Heart: regular rate and rhythm, no murmurs, gallops, or  rubs Abdomen: protuberant, non-tender, non-distended, normal bowel sounds Extremities: faint DP pulses bilaterally, 1-2+ edema to BLE  Neurologic: alert & oriented X3, cranial nerves II-XII grossly intact, moving all extremities and responding to tactile and verbal stimulation  Lab results: Basic Metabolic Panel:  Recent Labs  81/19/14 1429 12/04/13 1615  NA 138  --   K 6.0* 5.9*  CL 102  --   CO2 22  --   GLUCOSE 135*  --   BUN 47*  --   CREATININE 2.42*  --   CALCIUM 8.7  --    Liver Function Tests:  Recent Labs  12/04/13 1429  AST 48*  ALT 41  ALKPHOS 34*  BILITOT 0.4  PROT 6.9  ALBUMIN 3.4*   CBC:  Recent Labs  12/04/13 1429  WBC 6.0  HGB 11.3*  HCT 34.4*  MCV 96.9  PLT 148*   Urine Drug Screen: Drugs of Abuse     Component Value Date/Time   LABOPIA NONE DETECTED 12/25/2010 2137   COCAINSCRNUR NONE DETECTED 12/25/2010 2137   LABBENZ NONE DETECTED 12/25/2010 2137   AMPHETMU NONE DETECTED 12/25/2010 2137   THCU NONE DETECTED 12/25/2010 2137   LABBARB  Value: NONE DETECTED        DRUG SCREEN FOR MEDICAL PURPOSES ONLY.  IF CONFIRMATION IS NEEDED FOR ANY PURPOSE, NOTIFY LAB WITHIN 5 DAYS.        LOWEST DETECTABLE LIMITS FOR URINE DRUG SCREEN Drug Class       Cutoff (ng/mL) Amphetamine      1000 Barbiturate      200 Benzodiazepine   200 Tricyclics       300 Opiates          300 Cocaine          300 THC              50 12/25/2010 2137    Urinalysis:  Recent Labs  12/04/13 1450  COLORURINE YELLOW  LABSPEC 1.015  PHURINE 5.0  GLUCOSEU NEGATIVE  HGBUR NEGATIVE  BILIRUBINUR NEGATIVE  KETONESUR NEGATIVE  PROTEINUR NEGATIVE  UROBILINOGEN 0.2  NITRITE NEGATIVE  LEUKOCYTESUR NEGATIVE    Imaging results:  Ct Head Wo Contrast  12/04/2013   CLINICAL DATA:  Altered mental status  EXAM: CT HEAD WITHOUT CONTRAST  TECHNIQUE: Contiguous axial images were obtained from the base of  the skull through the vertex without intravenous contrast.  COMPARISON:  Noncontrast CT scan of brain dated November 20, 2013.  FINDINGS: There is stable moderate diffuse cerebral and cerebellar atrophy with compensatory ventriculomegaly. There is no evidence of an acute intracranial hemorrhage nor of an evolving ischemic infarction. Stable decreased density in the deep white matter of both cerebral hemispheres as consistent with chronic small vessel ischemic type change. The cerebellum and brainstem are normal in density.  At bone window settings the observed portions of the paranasal sinuses and mastoid air cells exhibit no air-fluid levels. There is minimal mucoperiosteal thickening within ethmoid sinus cells. There is no evidence of an acute skull fracture.  IMPRESSION: 1. There is no evidence of an acute intracranial hemorrhage nor of an evolving ischemic infarction. 2. There is no intracranial mass effect or hydrocephalus. 3. There is stable moderate diffuse cerebral and cerebellar atrophy and white matter density hypodensity consistent with chronic small vessel ischemic type change.   Electronically Signed   By: David  [place]   On: 12/04/2013 15:24    Other results: EKG: NSR, normal axis and intervals, partial RBBB; no ischemic  changes. No peaked T waves.  Assessment & Plan by Problem:  # Dementia with ?AMS: Unclear why exactly patient was brought to the ED.  Unsure what ALF meant by "abnormal gait." His neurological exam in grossly intact, though very limited 2/2 patient condition. His "shuffling gait" is new 1 week ago, requires walker now. Sister thinks his mental status may be slightly worse than baseline. Patient is A&O x 1. He has h/o MMSE 18/30 in 2012, so this may be his new baseline as sister thinks he has more rapidly worsened in the last 2 months. UTI was on differential dx, though UA was clean. Will need to evaluate for other sources of infection such as pneumonia (though no cough on exam).  Patient looks fluid overloaded and he has hx of CAD w/ CABG, therefore fluid overload 2/2 possible CHF is also a possibility. CVA also in the differential, so will perform work up for this. -CXR -2D echo -carotid doppler -TSH, A1c, lipid panel -MRI head  -continue risperdal, exelon, depakote, aricept -can give ativan if patient becomes agitated overnight -CBC in AM -PT/OT  # Fluid overload on exam: Pt with hx of CAD and CABG, though no known hx of CHF (no echo in EPIC), though patient appeared fluid overloaded on exam (. We were unable to fully assess lungs as patient would not sit up for [place]. ED started IVF at 125cc/hr. Should also consider CKD as Cr is elevated, though unclear what his baseline is. -stop IVF -lasix 20mg  IV x 1  -CXR -EKG in AM -daily weight, I/Os -cycle troponins -proBNP -continue home coreg, lipitor, ASA, enalapril, fenofibrate  # DM type 2: Last A1c 13.5 in 2012. Patient is on lantus 70U qdaily and aspart 8U TID with meals at home. CBGs 135-199 in ED. -SSI, sensitive -lantus 35U qHS  # HLD: Will repeat lipid panel. On lipitor 20mg  daily and fenofibrate 160mg  daily.  -continue lipitor and fenofibrate   # HTN: BPs slightly elevated on admission, 170/54. Will continue to trend and restart all home antihypertensives. -continue coreg, enalapril   # VTE: heparin  # Diet: NPO, if passes beside swallow can give carb mod diet  Code status: full  Dispo: Disposition is deferred at this time, awaiting improvement of current medical problems. Anticipated discharge in approximately 1-2 day(s).   The patient does have a current PCP Travis Davenport, MD) and does not need an [facility] hospital follow-up appointment after discharge.  The patient does not have transportation limitations that hinder transportation to clinic appointments.  Signed: Windell Hummingbird, MD 12/04/2013, 6:30 PM

## 2013-12-04 NOTE — ED Provider Notes (Signed)
Medical screening examination/treatment/procedure(s) were conducted as a shared visit with non-physician practitioner(s) and myself.  I personally evaluated the patient during the encounter.  EKG Interpretation    Date/Time:  Friday December 04 2013 14:10:49 EST Ventricular Rate:  66 PR Interval:  191 QRS Duration: 115 QT Interval:  405 QTC Calculation: 424 R Axis:   46 Text Interpretation:  Age not entered, assumed to be  80 years old for purpose of ECG interpretation Sinus rhythm Incomplete right bundle branch block ST elev, probable normal early repol pattern No significant change since last tracing Confirmed by Sundeep Cary  DO, Anique Beckley ([id number]) on 12/04/2013 2:39:15 PM            Patient is a 80 year old male with a history of hyperlipidemia, hypertension, insulin-dependent diabetes, dementia who presents from [facility] for altered mental status and increased incontinence. Patient here is hemodynamically stable with no complaints. His history is limited given his dementia. His daughter at bedside denies any recent infectious symptoms. Patient denies any chest pain, abdominal pain, back pain. He is moving all his extremities normally. No neurologic deficit on exam. He is hemodynamically stable. His labs show acute on chronic renal failure and hyperkalemia. EKG shows no peaked T waves or interval changes. We'll give IV fluids for dehydration and recheck potassium level. Will discuss with medicine for admission.  Travis MawKristen N Layney Gillson, DO 12/04/13 1538

## 2013-12-05 DIAGNOSIS — I10 Essential (primary) hypertension: Secondary | ICD-10-CM

## 2013-12-05 DIAGNOSIS — E8779 Other fluid overload: Secondary | ICD-10-CM

## 2013-12-05 DIAGNOSIS — I519 Heart disease, unspecified: Secondary | ICD-10-CM

## 2013-12-05 DIAGNOSIS — E8770 Fluid overload, unspecified: Secondary | ICD-10-CM | POA: Diagnosis present

## 2013-12-05 DIAGNOSIS — E119 Type 2 diabetes mellitus without complications: Secondary | ICD-10-CM

## 2013-12-05 DIAGNOSIS — F0390 Unspecified dementia without behavioral disturbance: Secondary | ICD-10-CM

## 2013-12-05 DIAGNOSIS — E785 Hyperlipidemia, unspecified: Secondary | ICD-10-CM

## 2013-12-05 LAB — TROPONIN I: Troponin I: <0.3 ng/mL (ref <0.30)

## 2013-12-05 LAB — CBC
HCT: 36.2 % — ABNORMAL LOW (ref 39.0–52.0)
Hemoglobin: 12 g/dL — ABNORMAL LOW (ref 13.0–17.0)
MCH: 31.5 pg (ref 26.0–34.0)
MCHC: 33.1 g/dL (ref 30.0–36.0)
MCV: 95 fL (ref 78.0–100.0)
PLATELETS: 157 10*3/uL (ref 150–400)
RBC: 3.81 MIL/uL — AB (ref 4.22–5.81)
RDW: 13.9 % (ref 11.5–15.5)
WBC: 5.6 10*3/uL (ref 4.0–10.5)

## 2013-12-05 LAB — GLUCOSE, CAPILLARY
GLUCOSE-CAPILLARY: 64 mg/dL — AB (ref 70–99)
GLUCOSE-CAPILLARY: 98 mg/dL (ref 70–99)
GLUCOSE-CAPILLARY: 196 mg/dL — AB (ref 70–99)
GLUCOSE-CAPILLARY: 285 mg/dL — AB (ref 70–99)
Glucose-Capillary: 248 mg/dL — ABNORMAL HIGH (ref 70–99)

## 2013-12-05 LAB — BASIC METABOLIC PANEL
BUN: 40 mg/dL — ABNORMAL HIGH (ref 6–23)
BUN: 41 mg/dL — ABNORMAL HIGH (ref 6–23)
CALCIUM: 8.7 mg/dL (ref 8.4–10.5)
CHLORIDE: 105 meq/L (ref 96–112)
CO2: 24 mEq/L (ref 19–32)
CO2: 23 meq/L (ref 19–32)
CREATININE: 2.06 mg/dL — AB (ref 0.50–1.35)
Calcium: 8.7 mg/dL (ref 8.4–10.5)
Chloride: 105 mEq/L (ref 96–112)
Creatinine, Ser: 2.09 mg/dL — ABNORMAL HIGH (ref 0.50–1.35)
GFR calc non Af Amer: 28 mL/min — ABNORMAL LOW (ref >90)
GFR, EST AFRICAN AMERICAN: 34 mL/min — AB (ref >90)
GFR, EST AFRICAN AMERICAN: 33 mL/min — AB (ref >90)
GFR, EST NON AFRICAN AMERICAN: 29 mL/min — AB (ref >90)
Glucose, Bld: 53 mg/dL — ABNORMAL LOW (ref 70–99)
Glucose, Bld: 98 mg/dL (ref 70–99)
POTASSIUM: 4.5 meq/L (ref 3.7–5.3)
Potassium: 4.4 mEq/L (ref 3.7–5.3)
SODIUM: 143 meq/L (ref 137–147)
Sodium: 145 mEq/L (ref 137–147)

## 2013-12-05 LAB — HEMOGLOBIN A1C
HEMOGLOBIN A1C: 6.2 % — AB (ref <5.7)
Mean Plasma Glucose: 131 mg/dL — ABNORMAL HIGH (ref <117)

## 2013-12-05 LAB — UREA NITROGEN, URINE: UREA NITROGEN UR: 538 mg/dL

## 2013-12-05 LAB — TSH: TSH: 1.528 u[IU]/mL (ref 0.350–4.500)

## 2013-12-05 LAB — MAGNESIUM: MAGNESIUM: 2.2 mg/dL (ref 1.5–2.5)

## 2013-12-05 MED ORDER — LORAZEPAM 1 MG PO TABS: 1.0000 mg | ORAL_TABLET | Freq: Once | ORAL | Status: DC

## 2013-12-05 MED ORDER — INFLUENZA VAC SPLIT QUAD 0.5 ML IM SUSP
0.5000 mL | INTRAMUSCULAR | Status: AC
  Administered 2013-12-06: 0.5 mL via INTRAMUSCULAR
  Filled 2013-12-05: qty 0.5

## 2013-12-05 MED ORDER — PNEUMOCOCCAL VAC POLYVALENT 25 MCG/0.5ML IJ INJ
0.5000 mL | INJECTION | INTRAMUSCULAR | Status: AC
  Administered 2013-12-06: 0.5 mL via INTRAMUSCULAR
  Filled 2013-12-05: qty 0.5

## 2013-12-05 MED ORDER — INSULIN GLARGINE 100 UNIT/ML ~~LOC~~ SOLN: 35.0000 [IU] | Freq: Every day | SUBCUTANEOUS | Status: DC

## 2013-12-05 MED ORDER — LORAZEPAM 0.5 MG PO TABS: 0.5000 mg | ORAL_TABLET | Freq: Once | ORAL | Status: DC

## 2013-12-05 MED ORDER — FUROSEMIDE 20 MG PO TABS
20.0000 mg | ORAL_TABLET | Freq: Every day | ORAL | Status: DC
  Administered 2013-12-05 – 2013-12-06 (×2): 20 mg via ORAL
  Filled 2013-12-05 (×3): qty 1

## 2013-12-05 NOTE — Progress Notes (Signed)
PT Cancellation Note  Patient Details Name: Wayna ChaletDonald C Grine MRN: [id number] DOB: 24-Sep-1932   Cancelled Treatment:    Reason Eval/Treat Not Completed: Other (comment) (pt agitated and not appropriate for PT at this time ) Will re-attempt to evaluate pt at next available time.    Donnamarie PoagWest, Jahlil Ziller [place], [place] [phone] 12/05/2013, 12:51 PM

## 2013-12-05 NOTE — Progress Notes (Signed)
Subjective: Patient has no complaints this morning. He is A&O x 1. He is talkative and better able to follow commands today. Very pleasant this morning.   Objective: Vital signs in last 24 hours: Filed Vitals:   12/04/13 1829 12/04/13 1830 12/04/13 1930 12/05/13 0554  BP: 185/40 185/40 167/66 171/67  Pulse: 72 76 91 71  Temp:   98.3 F (36.8 C) 98.3 F (36.8 C)  TempSrc:   Oral Oral  Resp: 18 18 18 18   Weight:   228 lb 2.8 oz (103.5 kg) 228 lb 2.8 oz (103.5 kg)  SpO2: 95% 95% 97% 99%   Weight change:   Intake/Output Summary (Last 24 hours) at 12/05/13 1126 Last data filed at 12/05/13 0914  Gross per 24 hour  Intake    690 ml  Output   1100 ml  Net   -410 ml   Physical Exam General: sitting upright in bed, alert and talkative, able to follow commands and participate in physical exam and interview much more this morning HEENT: pupils equal round and reactive to light, vision grossly intact, eyelids look somewhat erythematous w/ clear drainage from eyes; face looks less swollen Neck: supple Lungs: posterior lung fields clear bilaterally; normal work of respiration, no wheezes, rales, ronchi Heart: regular rate and rhythm, no murmurs, gallops, or rubs Abdomen: protuberant, non-tender, non-distended, normal bowel sounds  Extremities: faint DP pulses bilaterally, trace BLE edema Neurologic: alert & oriented X 1, mild intention tremor, though no resting tremor and no cogwheeling rigidity, cranial nerves II-XII grossly intact, strength 5/5 throughout, though somewhat limited 2/2 patient understanding, sensation intact to light touch  Lab Results: Basic Metabolic Panel:  Recent Labs Lab 12/04/13 1429 12/04/13 1615 12/05/13 0552  NA 138  --  145  K 6.0* 5.9* 4.4  CL 102  --  105  CO2 22  --  24  GLUCOSE 135*  --  53*  BUN 47*  --  40*  CREATININE 2.42*  --  2.06*  CALCIUM 8.7  --  8.7  MG  --   --  2.2   Liver Function Tests:  Recent Labs Lab 12/04/13 1429  AST  48*  ALT 41  ALKPHOS 34*  BILITOT 0.4  PROT 6.9  ALBUMIN 3.4*   CBC:  Recent Labs Lab 12/04/13 1429 12/05/13 0552  WBC 6.0 5.6  HGB 11.3* 12.0*  HCT 34.4* 36.2*  MCV 96.9 95.0  PLT 148* 157   Cardiac Enzymes:  Recent Labs Lab 12/04/13 2125 12/05/13 0552  TROPONINI <0.30 <0.30   BNP:  Recent Labs Lab 12/04/13 2125  PROBNP 442.5   CBG:  Recent Labs Lab 12/04/13 1838 12/04/13 2125 12/05/13 0757 12/05/13 0831  GLUCAP 199* 148* 64* 98   Hemoglobin A1C:  Recent Labs Lab 12/04/13 2125  HGBA1C 6.2*   Fasting Lipid Panel:  Recent Labs Lab 12/04/13 2125  CHOL 175  HDL 20*  LDLCALC 119*  TRIG 180*  CHOLHDL 8.8   Thyroid Function Tests:  Recent Labs Lab 12/04/13 2125  TSH 1.528   Urine Drug Screen: Drugs of Abuse     Component Value Date/Time   LABOPIA NONE DETECTED 12/25/2010 2137   COCAINSCRNUR NONE DETECTED 12/25/2010 2137   LABBENZ NONE DETECTED 12/25/2010 2137   AMPHETMU NONE DETECTED 12/25/2010 2137   THCU NONE DETECTED 12/25/2010 2137   LABBARB  Value: NONE DETECTED        DRUG SCREEN FOR MEDICAL PURPOSES ONLY.  IF CONFIRMATION IS NEEDED FOR ANY PURPOSE, NOTIFY  LAB WITHIN 5 DAYS.        LOWEST DETECTABLE LIMITS FOR URINE DRUG SCREEN Drug Class       Cutoff (ng/mL) Amphetamine      1000 Barbiturate      200 Benzodiazepine   200 Tricyclics       300 Opiates          300 Cocaine          300 THC              50 12/25/2010 2137    Urinalysis:  Recent Labs Lab 12/04/13 1450  COLORURINE YELLOW  LABSPEC 1.015  PHURINE 5.0  GLUCOSEU NEGATIVE  HGBUR NEGATIVE  BILIRUBINUR NEGATIVE  KETONESUR NEGATIVE  PROTEINUR NEGATIVE  UROBILINOGEN 0.2  NITRITE NEGATIVE  LEUKOCYTESUR NEGATIVE    Micro Results: No results found for this or any previous visit (from the past 240 hour(s)). Studies/Results: Dg Chest 1 View  12/04/2013   CLINICAL DATA:  Volume overload.  Altered mental status.  EXAM: CHEST - 1 VIEW  COMPARISON:  Chest x-ray 11/20/2013.  FINDINGS:  Lung volumes are low. Bibasilar opacities may reflect areas of atelectasis and/or consolidation. Possible small left pleural effusion. Cephalization of the pulmonary vasculature, with mild indistinctness of interstitial markings, suggesting mild interstitial pulmonary edema. Mild cardiomegaly. Upper mediastinal contours are distorted by lordotic positioning, but are otherwise within normal limits. Status post median sternotomy for CABG.  IMPRESSION: 1. The appearance of the chest suggests early congestive heart failure. 2. Low lung volumes with probable bibasilar subsegmental atelectasis and small left pleural effusion.   Electronically Signed   By: Trudie Reed M.D.   On: 12/04/2013 19:17   Ct Head Wo Contrast  12/04/2013   CLINICAL DATA:  Altered mental status  EXAM: CT HEAD WITHOUT CONTRAST  TECHNIQUE: Contiguous axial images were obtained from the base of the skull through the vertex without intravenous contrast.  COMPARISON:  Noncontrast CT scan of brain dated November 20, 2013.  FINDINGS: There is stable moderate diffuse cerebral and cerebellar atrophy with compensatory ventriculomegaly. There is no evidence of an acute intracranial hemorrhage nor of an evolving ischemic infarction. Stable decreased density in the deep white matter of both cerebral hemispheres as consistent with chronic small vessel ischemic type change. The cerebellum and brainstem are normal in density.  At bone window settings the observed portions of the paranasal sinuses and mastoid air cells exhibit no air-fluid levels. There is minimal mucoperiosteal thickening within ethmoid sinus cells. There is no evidence of an acute skull fracture.  IMPRESSION: 1. There is no evidence of an acute intracranial hemorrhage nor of an evolving ischemic infarction. 2. There is no intracranial mass effect or hydrocephalus. 3. There is stable moderate diffuse cerebral and cerebellar atrophy and white matter density hypodensity consistent with chronic  small vessel ischemic type change.   Electronically Signed   By: David  [place]   On: 12/04/2013 15:24   Medications: I have reviewed the patient's current medications. Scheduled Meds: . aspirin  81 mg Oral Daily  . atorvastatin  20 mg Oral QHS  . carvedilol  6.25 mg Oral BID WC  . cholecalciferol  1,000 Units Oral Daily  . divalproex  250 mg Oral TID  . donepezil  10 mg Oral QHS  . ferrous sulfate  325 mg Oral Q breakfast  . heparin  5,000 Units Subcutaneous Q8H  . insulin aspart  0-9 Units Subcutaneous TID WC  . nystatin cream  1 application Topical BID  .  sodium chloride  3 mL Intravenous Q12H   Continuous Infusions:  PRN Meds:. Assessment/Plan:  # Dementia with ?AMS and ?new abnormal gait: Unclear if patient is at baseline or not. There is a question about an abnormal gait. Pt has not ambulated since admission. We will get PT/OT to assess. ACS ruled out with 3 negative troponins. Risk stratification revealed normal TSH, dyslipidemia (Total chol 175, LDL 119, TG 180, HDL 20), [id number] 6.2%. Gait abnormality may represent EPS from risperdal. Will hold for now and see how gait responds. -hold risperdal and exelon -continue depakote and aricept -evaluate for CHF w/ 2D echo -PT/OT -cancel carotid doppler and MRI head given do not think this is a stroke -can give ativan if patient becomes agitated overnight   # Fluid overload on exam: Improved. No known hx of CHF, though has CAD s/p CABG. ProBNP 442 and CXR showed signs of early CHF. Patient received lasix 20mg  IV overnight and diuresed well (negative 860cc with condom catheter). Unknown baseline weight. His BLE edema is improved and his lungs are CTAB.  -consider discharging w/ increased dose of lasix -2D echo pending  -continue home coreg, lipitor, ASA -holding fenofibrate and enalapril given AKI   # DM type 2: A1c on admission was 6.2%. He is perhaps too well controlled for his age and chronic disease. Patient is on lantus 70U qdaily  and aspart 8U TID with meals at home. CBG dropped this AM to 64, so we discontinued the evening lantus for now. He did not receive lantus last night.  -SSI, sensitive  -no lantus for now, though can give tonight if CBGs are elevated  # HLD: Patient with significant dyslipidemia as per above. On lipitor 20mg  daily and fenofibrate 160mg  daily.  -continue lipitor -holding fenofibrate 2/2 AKI  # HTN: BPs remain elevated, 171/67.  Will continue to trend.  -continue coreg -holding enalapril as above  # VTE: heparin  # Diet: carb mod diet Code status: full  Dispo: Disposition is deferred at this time, awaiting improvement of current medical problems.  Anticipated discharge in approximately 1 day(s).   The patient does have a current PCP Dois Davenport(Karen L Richter, MD) and does not need an [facility] hospital follow-up appointment after discharge.  The patient does not have transportation limitations that hinder transportation to clinic appointments.  .Services Needed at time of discharge: Y = Yes, Blank = No PT:   OT:   RN:   Equipment:   Other:     LOS: 1 day   Windell Hummingbirdachel Geanna Divirgilio, MD 12/05/2013, 11:26 AM

## 2013-12-05 NOTE — Progress Notes (Signed)
Hypoglycemic Event  CBG: 64  Treatment: 15 GM carbohydrate snack  Symptoms: None  Follow-up CBG: Time:0830 CBG Result:98    Possible Reasons for Event: Unknown  Comments/MD notified:MD called RN with concern about AM lab glucose level.  CBG verified and results called to MD.      Marinus MawHowdeshell, [facility] CenterMelissa Hope  Remember to initiate Hypoglycemia Order Set & complete

## 2013-12-05 NOTE — Progress Notes (Signed)
  Echocardiogram 2D Echocardiogram has been performed.  Travis Coleman, Travis Coleman M 12/05/2013, 3:09 PM

## 2013-12-05 NOTE — Progress Notes (Signed)
0200-- Pt's K+ level  Result 4.5  This am, was drawn at approx 0010. Dr Dierdre SearlesLi advised and she was advised that result incorrectly formatted in epic for 12/04/13 at [facility]. Verified w/ lab that the 4.5 result was this am blood draw.

## 2013-12-05 NOTE — H&P (Addendum)
  Date: 12/05/2013  Patient name: Travis Coleman  Medical record number: [id number]  Date of birth: [birth year]   I have seen and evaluated Travis Chaletonald C Blakley and discussed their care with the Residency Team. Mr Fabian NovemberCreed was transferred from his SNF for altered / shuffling gait. He was now having to use walker when previously was using cane. Found to have hyperkalemia and acute on chronic renal failure and volume overload. He has baseline dementia.  He was never married bc he didn't think he could stand a woman for more than 2 or 3 days. He used to be a Advertising account executivewedding photographer. [place]   Assessment and Plan: I have seen and evaluated the patient as outlined above. I agree with the formulated Assessment and Plan as detailed in the residents' admission note, with the following changes:   1. Hyperkalemia - has resolved with kayexalate. Cause - likely combo of ACEI and ARF. No other K increasing meds. Since this is second occurrence this yr, would likely NOT D/C on ACEI unless he is found to have reduced EF and if he does, would need to consider risk / benefit ration.  2. A on chronic renal failure - In Feb 2012, was 1.42. Then increased to 2.04 12/12. Has decreased from 2.42 to 2.06 since admit and diuresis. Might have been 2/2 vol overload and decreased renal perfusion. Cont holding ACEI and resume home lasix. He is diuresing and now at baseline Cr so no reason to check renal U/S for obstruction.   3. Volume overload - agree with ECHO as he is at high risk and wil help to guide tx. His face still looks a bit edematous but is improved per admit team. Of note, there was no indication of SVC syndrome on past 2 CXR.   4. Dementia - Today pt is pleasant and was without aggitation overnight so agree with holding Risperdal and exelon. His altered gait could just be a manifestation of his progressive dementia.  5. Altered gait - PT / OT  6. DM II insuline dep - A1C is too low at 6.2.  Agree with reduced dosage of lantus.  Burns SpainElizabeth A Butcher, MD 1/17/201512:17 PM

## 2013-12-06 LAB — GLUCOSE, CAPILLARY
GLUCOSE-CAPILLARY: 145 mg/dL — AB (ref 70–99)
GLUCOSE-CAPILLARY: 218 mg/dL — AB (ref 70–99)
GLUCOSE-CAPILLARY: 237 mg/dL — AB (ref 70–99)
Glucose-Capillary: 252 mg/dL — ABNORMAL HIGH (ref 70–99)

## 2013-12-06 LAB — BASIC METABOLIC PANEL
BUN: 42 mg/dL — ABNORMAL HIGH (ref 6–23)
CALCIUM: 8.7 mg/dL (ref 8.4–10.5)
CO2: 23 mEq/L (ref 19–32)
CREATININE: 1.86 mg/dL — AB (ref 0.50–1.35)
Chloride: 103 mEq/L (ref 96–112)
GFR calc Af Amer: 38 mL/min — ABNORMAL LOW (ref >90)
GFR calc non Af Amer: 33 mL/min — ABNORMAL LOW (ref >90)
GLUCOSE: 157 mg/dL — AB (ref 70–99)
Potassium: 4.7 mEq/L (ref 3.7–5.3)
Sodium: 142 mEq/L (ref 137–147)

## 2013-12-06 MED ORDER — ENALAPRIL MALEATE 2.5 MG PO TABS
2.5000 mg | ORAL_TABLET | Freq: Every day | ORAL | Status: DC
  Administered 2013-12-06 – 2013-12-07 (×2): 2.5 mg via ORAL
  Filled 2013-12-06 (×3): qty 1

## 2013-12-06 NOTE — Progress Notes (Signed)
Subjective: [name] was seen and examined at bedside.  He is in good spirits, smiling, and talkative. He is following commands, tolerated breakfast well.     Objective: Vital signs in last 24 hours: Filed Vitals:   12/05/13 0554 12/05/13 1615 12/05/13 2012 12/06/13 0528  BP: 171/67 184/71 171/67 194/66  Pulse: 71 76 71 88  Temp: 98.3 F (36.8 C) 99.3 F (37.4 C) 99 F (37.2 C) 98.7 F (37.1 C)  TempSrc: Oral Oral Oral Oral  Resp: 18 18 18 18   Weight: 228 lb 2.8 oz (103.5 kg)     SpO2: 99% 98% 98% 98%   Weight change:   Intake/Output Summary (Last 24 hours) at 12/06/13 1031 Last data filed at 12/06/13 0529  Gross per 24 hour  Intake    363 ml  Output    650 ml  Net   -287 ml   Physical Exam Vitals reviewed. General: sitting up in bed, NAD HEENT: b/l eye mild drainage with yellow crusting on outside, left eye more then other Cardiac: RRR Pulm: limited exam as patient unable to raise up out of bed but clear to auscultation bilaterally anteriorly Abd: soft, nontender, obese, nondistended, BS present Ext: warm and well perfused, +1 b/l pitting edema, tenderness to deep palpation of extremities, moving all 4 extremities Neuro: alert and oriented X3, cranial nerves II-XII grossly intact, equal strength in b/l upper and lower extremities but poor effort in lower extremities, responds to commands. Gait not assessed as patient does not like people making him move. Finger to nose intact but slightly off with right hand, mild tremor in both hands but stable at rest  Lab Results: Basic Metabolic Panel:  Recent Labs Lab 12/04/13 1429  12/05/13 0552 12/06/13 0634  NA 138  --  145 142  K 6.0*  < > 4.4 4.7  CL 102  --  105 103  CO2 22  --  24 23  GLUCOSE 135*  --  53* 157*  BUN 47*  --  40* 42*  CREATININE 2.42*  --  2.06* 1.86*  CALCIUM 8.7  --  8.7 8.7  MG  --   --  2.2  --   < > = values in this interval not displayed. Liver Function Tests:  Recent Labs Lab  12/04/13 1429  AST 48*  ALT 41  ALKPHOS 34*  BILITOT 0.4  PROT 6.9  ALBUMIN 3.4*   CBC:  Recent Labs Lab 12/04/13 1429 12/05/13 0552  WBC 6.0 5.6  HGB 11.3* 12.0*  HCT 34.4* 36.2*  MCV 96.9 95.0  PLT 148* 157   Cardiac Enzymes:  Recent Labs Lab 12/04/13 2125 12/05/13 0552  TROPONINI <0.30 <0.30   BNP:  Recent Labs Lab 12/04/13 2125  PROBNP 442.5   CBG:  Recent Labs Lab 12/05/13 0757 12/05/13 0831 12/05/13 1238 12/05/13 1645 12/05/13 2023 12/06/13 0820  GLUCAP 64* 98 196* 285* 248* 145*   Hemoglobin A1C:  Recent Labs Lab 12/04/13 2125  HGBA1C 6.2*   Fasting Lipid Panel:  Recent Labs Lab 12/04/13 2125  CHOL 175  HDL 20*  LDLCALC 119*  TRIG 180*  CHOLHDL 8.8   Thyroid Function Tests:  Recent Labs Lab 12/04/13 2125  TSH 1.528   Urine Drug Screen: Drugs of Abuse     Component Value Date/Time   LABOPIA NONE DETECTED 12/25/2010 2137   COCAINSCRNUR NONE DETECTED 12/25/2010 2137   LABBENZ NONE DETECTED 12/25/2010 2137   AMPHETMU NONE DETECTED 12/25/2010 2137   THCU  NONE DETECTED 12/25/2010 2137   LABBARB  Value: NONE DETECTED        DRUG SCREEN FOR MEDICAL PURPOSES ONLY.  IF CONFIRMATION IS NEEDED FOR ANY PURPOSE, NOTIFY LAB WITHIN 5 DAYS.        LOWEST DETECTABLE LIMITS FOR URINE DRUG SCREEN Drug Class       Cutoff (ng/mL) Amphetamine      1000 Barbiturate      200 Benzodiazepine   200 Tricyclics       300 Opiates          300 Cocaine          300 THC              50 12/25/2010 2137    Urinalysis:  Recent Labs Lab 12/04/13 1450  COLORURINE YELLOW  LABSPEC 1.015  PHURINE 5.0  GLUCOSEU NEGATIVE  HGBUR NEGATIVE  BILIRUBINUR NEGATIVE  KETONESUR NEGATIVE  PROTEINUR NEGATIVE  UROBILINOGEN 0.2  NITRITE NEGATIVE  LEUKOCYTESUR NEGATIVE   Studies/Results: Dg Chest 1 View  12/04/2013   CLINICAL DATA:  Volume overload.  Altered mental status.  EXAM: CHEST - 1 VIEW  COMPARISON:  Chest x-ray 11/20/2013.  FINDINGS: Lung volumes are low. Bibasilar  opacities may reflect areas of atelectasis and/or consolidation. Possible small left pleural effusion. Cephalization of the pulmonary vasculature, with mild indistinctness of interstitial markings, suggesting mild interstitial pulmonary edema. Mild cardiomegaly. Upper mediastinal contours are distorted by lordotic positioning, but are otherwise within normal limits. Status post median sternotomy for CABG.  IMPRESSION: 1. The appearance of the chest suggests early congestive heart failure. 2. Low lung volumes with probable bibasilar subsegmental atelectasis and small left pleural effusion.   Electronically Signed   By: Trudie Reedaniel  Entrikin M.D.   On: 12/04/2013 19:17   Ct Head Wo Contrast  12/04/2013   CLINICAL DATA:  Altered mental status  EXAM: CT HEAD WITHOUT CONTRAST  TECHNIQUE: Contiguous axial images were obtained from the base of the skull through the vertex without intravenous contrast.  COMPARISON:  Noncontrast CT scan of brain dated November 20, 2013.  FINDINGS: There is stable moderate diffuse cerebral and cerebellar atrophy with compensatory ventriculomegaly. There is no evidence of an acute intracranial hemorrhage nor of an evolving ischemic infarction. Stable decreased density in the deep white matter of both cerebral hemispheres as consistent with chronic small vessel ischemic type change. The cerebellum and brainstem are normal in density.  At bone window settings the observed portions of the paranasal sinuses and mastoid air cells exhibit no air-fluid levels. There is minimal mucoperiosteal thickening within ethmoid sinus cells. There is no evidence of an acute skull fracture.  IMPRESSION: 1. There is no evidence of an acute intracranial hemorrhage nor of an evolving ischemic infarction. 2. There is no intracranial mass effect or hydrocephalus. 3. There is stable moderate diffuse cerebral and cerebellar atrophy and white matter density hypodensity consistent with chronic small vessel ischemic type  change.   Electronically Signed   By: David  SwazilandJordan   On: 12/04/2013 15:24   Medications: I have reviewed the patient's current medications. Scheduled Meds: . aspirin  81 mg Oral Daily  . atorvastatin  20 mg Oral QHS  . carvedilol  6.25 mg Oral BID WC  . cholecalciferol  1,000 Units Oral Daily  . divalproex  250 mg Oral TID  . donepezil  10 mg Oral QHS  . ferrous sulfate  325 mg Oral Q breakfast  . furosemide  20 mg Oral Daily  . heparin  5,000 Units Subcutaneous Q8H  . influenza vac split quadrivalent PF  0.5 mL Intramuscular Tomorrow-1000  . insulin aspart  0-9 Units Subcutaneous TID WC  . nystatin cream  1 application Topical BID  . pneumococcal 23 valent vaccine  0.5 mL Intramuscular Tomorrow-1000  . sodium chloride  3 mL Intravenous Q12H   Continuous Infusions:  PRN Meds:. Assessment/Plan: Mr. Wegman is a 80 year old male with dementia admitted for acute encephalopathy.  Acute encephalopathy in setting of dementia with ?shuffling gait per ALF staff--seems to be back to baseline. PT unable to work with Mr. Seitzinger as he gets very agitated at that time but otherwise very pleasant at rest and when not bothered. Will continue to hold Risperdal and exelon, unclear why on these medications and for how longer. Will need to be reassessed by pcp as they could be contributing to his mental status on admission.  -continue depakote and aricept. Noted to have low depakote level on admission (28), ?complaince, will continue at home dose for now and recommend to titrate as needed by pcp as outpatient.  -return back to ALF, appreciate social work assistance  Volume overload--on physical exam, on lasix at home and improved with extra dose of IV lasix 20mg  on admission.  Echo done yesterday revealed EF 55-60% with no regional wall motion abnormalities identified, normal systolic function, and PA . I/O net -~773mL this admission, but ?accurace. Condom cath in place.  Unknown baseline weight, 228lbs  on admission.  -continue home dose lasix 20mg  qd -continue home coreg, lipitor, ASA  Acute on chronic renal failure--CKD 3, bsaeline unclear but was 2.04 in 2012.  Cr on admission up to 2.42, down to 1.86 today after extra dose of lasix. Possibly secondary to increased volume on admission.  Initially held ACEi but will resume given hypertension and improvement in renal function.  -resume enalapril 2.5mg  qd but will continue to hold fenofibrate   DM type 2: A1c on admission was 6.2%. He is perhaps too well controlled for his age and chronic disease. Patient is on lantus 70U qdaily and aspart 8U TID with meals at home, however likely too high for him given relatively well controlled CBG's in the hospital.  Noted to have hypoglycemia yesterday but no episodes today.  -SSI, sensitive for now, may increase to to moderate scale or start low dose lantus  HLD: TG 180 with cholesterol 175, HDL 20 and LDL 19. On lipitor 20mg  daily and fenofibrate 160mg  daily at home  -continue lipitor for now -holding fenofibrate 2/2 AKI on admission, likely resume on discharge  HTN: BPs elevated as were holding ACEi on admission due to AKI. -continue coreg, may consider titrating up -resume home enalapril (was on 2.5mg  qd at home)  VTE: heparin  Diet: carb mod diet Code status: full  Dispo: Disposition is deferred at this time, awaiting improvement of current medical problems.  Anticipated discharge in approximately 1 day(s). Possible d/c back to ALF vs. ?SNF (need to discuss with social work and PT)  The patient does have a current PCP Dois Davenport, MD) and does not need an [facility] hospital follow-up appointment after discharge.  The patient does not have transportation limitations that hinder transportation to clinic appointments.  Services Needed at time of discharge: Y = Yes, Blank = No PT: SNF vs. ALF if able to provide 24 hour assist  OT:   RN:   Equipment:   Other:     LOS: 2 days   Darden Palmer,  MD 12/06/2013, 10:31 AM

## 2013-12-06 NOTE — Evaluation (Signed)
Clinical/Bedside Swallow Evaluation Patient Details  Name: Travis Coleman MRN: [id number] Date of Birth: 15-Dec-1931  Today's Date: 12/06/2013 Time: 1330-1400 SLP Time Calculation (min): 30 min  Past Medical History:  Past Medical History  Diagnosis Date  . Diabetes mellitus   . Hyperlipidemia   . Hypertension   . CAD (coronary artery disease) of bypass graft     Multivessel  . Dementia     MMSE 18/30 Feb 2012 [facility](Hospital)  . PVD (peripheral vascular disease)   . Cataract     bilateral   Past Surgical History:  Past Surgical History  Procedure Laterality Date  . Coronary artery bypass graft     HPI:   have seen and evaluated Travis Chaletonald C Chalk and discussed their care with the Residency Team. Mr Fabian NovemberCreed was transferred from his SNF for altered / shuffling gait. He was now having to use walker when previously was using cane. Found to have hyperkalemia and acute on chronic renal failure and volume overload. He has baseline dementia.  CT: There is no evidence of an acute intracranial hemorrhage nor ofan evolving ischemic infarction.2. There is no intracranial mass effect or hydrocephalus.    Assessment / Plan / Recommendation Clinical Impression  BSE completed per Stroke Protocol.  Observed directly with regular solids, puree and thin liquid  by cup during noon meal.  Mastication extended due to lack of dentition.  No outward clinical s/s of aspiration noted throughout evaluation.  Recommend to continue current diet consistency as tolerated with intermittent supervision.    [facility] due to noted cognitive deficits .  ST to sign off as education complete.     Aspiration Risk  Mild    Diet Recommendation Regular;Thin liquid   Liquid Administration via: Cup;Straw Medication Administration: Whole meds with liquid Supervision: Patient able to self feed;Intermittent supervision to cue for compensatory strategies Compensations: Slow rate;Small  sips/bites Postural Changes and/or Swallow Maneuvers: Seated upright 90 degrees;Upright 30-60 min after meal;Out of bed for meals    Other  Recommendations Oral Care Recommendations: Oral care BID   Follow Up Recommendations  Skilled Nursing facility;24 hour supervision/assistance         Swallow Study Prior Functional Status   No reports of history of dysphagia     General Date of Onset: 12/04/13 HPI:  have seen and evaluated Travis Chaletonald C Righter and discussed their care with the Residency Team. Mr Fabian NovemberCreed was transferred from his SNF for altered / shuffling gait. He was now having to use walker when previously was using cane. Found to have hyperkalemia and acute on chronic renal failure and volume overload. He has baseline dementia. Type of Study: Bedside swallow evaluation Diet Prior to this Study: Regular;Thin liquids Temperature Spikes Noted: No Respiratory Status: Room air History of Recent Intubation: No Behavior/Cognition: Alert;Cooperative;Pleasant mood;Confused;Requires cueing;Hard of hearing Oral Cavity - Dentition: Edentulous Self-Feeding Abilities: Able to feed self Patient Positioning: Upright in chair Baseline Vocal Quality: Clear Volitional Cough: Strong Volitional Swallow: Able to elicit    Oral/Motor/Sensory Function Overall Oral Motor/Sensory Function: Appears within functional limits for tasks assessed   Ice Chips Ice chips: Not tested   Thin Liquid Thin Liquid: Within functional limits Presentation: Cup    Nectar Thick Nectar Thick Liquid: Not tested   Honey Thick Honey Thick Liquid: Not tested   Puree Puree: Within functional limits   Solid   GO    Solid: Impaired Other Comments: extended mastication due to lack of dentition  Moreen Fowler MS, CCC-SLP [phone] [facility] 12/06/2013,3:02 PM

## 2013-12-06 NOTE — Progress Notes (Signed)
CSW sent PT note to ALF to see if they can accept pt back at that level of care. Awaiting call back.  If they can accept pt back, pt cannot return until Monday 1/19 per ALF.  If pt needs SNF CSW will begin placement process.   CSW continues to follow for discharge planning.  [phone] (weekend CSW)

## 2013-12-06 NOTE — Evaluation (Signed)
Physical Therapy Evaluation Patient Details Name: Travis ChaletDonald C Gladden MRN: [id number] DOB: Oct 13, 1932 Today's Date: 12/06/2013 Time: [id number] PT Time Calculation (min): 27 min  PT Assessment / Plan / Recommendation History of Present Illness  Pt admitted for AMS.  He resides in ALF.  Clinical Impression  Pt admitted with AMS. Pt currently with functional limitations due to the deficits listed below (see PT Problem List).  Pt will benefit from skilled PT to increase their independence and safety with mobility to allow discharge to the venue listed below. Recommending SNF.  Pt would also be able to d/c back to prior ALF, if facility able to provide needed level of assist 24 hr/day.      PT Assessment  Patient needs continued PT services    Follow Up Recommendations  SNF;Supervision/Assistance - 24 hour (Pt OK to return to prior ALF, if facility able to provide needed level of assist 24 hr/day.)    Does the patient have the potential to tolerate intense rehabilitation      Barriers to Discharge        Equipment Recommendations  None recommended by PT    Recommendations for Other Services     Frequency Min 3X/week    Precautions / Restrictions Precautions Precautions: Fall   Pertinent Vitals/Pain 0/10      Mobility  Bed Mobility Overal bed mobility: Needs Assistance Bed Mobility: Supine to Sit Supine to sit: Mod assist;HOB elevated Transfers Overall transfer level: Needs assistance Equipment used: Rolling walker (2 wheeled) Transfers: Sit to/from UGI CorporationStand;Stand Pivot Transfers Sit to Stand: Min assist Stand pivot transfers: Min assist General transfer comment: Pt retropulsive requiring verbal/tactile cues to correct. Ambulation/Gait Ambulation/Gait assistance: +2 safety/equipment Ambulation Distance (Feet): 3 Feet Assistive device: Rolling walker (2 wheeled) Gait velocity interpretation: <1.8 ft/sec, indicative of risk for recurrent falls    Exercises     PT Diagnosis:  Difficulty walking;Generalized weakness;Altered mental status  PT Problem List: Decreased activity tolerance;Decreased balance;Decreased mobility;Decreased coordination;Decreased safety awareness PT Treatment Interventions: DME instruction;Gait training;Functional mobility training;Therapeutic activities;Therapeutic exercise;Balance training;Cognitive remediation;Patient/family education     PT Goals(Current goals can be found in the care plan section) Acute Rehab PT Goals Patient Stated Goal: unable to state PT Goal Formulation: Patient unable to participate in goal setting Time For Goal Achievement: 12/13/13 Potential to Achieve Goals: Fair  Visit Information  Last PT Received On: 12/06/13 Assistance Needed: +1 History of Present Illness: Pt admitted for AMS.  He resides in ALF.       Prior Functioning  Home Living Family/patient expects to be discharged to:: Assisted living Home Equipment: Dan HumphreysWalker - 2 wheels;Cane - single point Prior Function Level of Independence: Independent with assistive device(s) Communication Communication: No difficulties    Cognition  Cognition Arousal/Alertness: Awake/alert Behavior During Therapy: WFL for tasks assessed/performed Overall Cognitive Status: History of cognitive impairments - at baseline Memory: Decreased recall of precautions;Decreased short-term memory    Extremity/Trunk Assessment     Balance    End of Session PT - End of Session Equipment Utilized During Treatment: Gait belt Activity Tolerance: Patient tolerated treatment well Patient left: in chair;with call bell/phone within reach;with chair alarm set;with nursing/sitter in room Nurse Communication: Mobility status  GP     Ilda FoilGarrow, Susi Goslin Rene 12/06/2013, 12:07 PM  Aida RaiderWendy Emmelyn Schmale, PT  Office # [phone] Pager [phone]

## 2013-12-07 DIAGNOSIS — N179 Acute kidney failure, unspecified: Secondary | ICD-10-CM | POA: Diagnosis not present

## 2013-12-07 DIAGNOSIS — I129 Hypertensive chronic kidney disease with stage 1 through stage 4 chronic kidney disease, or unspecified chronic kidney disease: Secondary | ICD-10-CM

## 2013-12-07 DIAGNOSIS — G9340 Encephalopathy, unspecified: Secondary | ICD-10-CM | POA: Diagnosis not present

## 2013-12-07 DIAGNOSIS — N189 Chronic kidney disease, unspecified: Secondary | ICD-10-CM

## 2013-12-07 DIAGNOSIS — E8779 Other fluid overload: Secondary | ICD-10-CM | POA: Diagnosis not present

## 2013-12-07 DIAGNOSIS — E1169 Type 2 diabetes mellitus with other specified complication: Secondary | ICD-10-CM | POA: Diagnosis not present

## 2013-12-07 DIAGNOSIS — R55 Syncope and collapse: Secondary | ICD-10-CM

## 2013-12-07 LAB — MRSA PCR SCREENING: MRSA by PCR: NEGATIVE

## 2013-12-07 LAB — GLUCOSE, CAPILLARY
GLUCOSE-CAPILLARY: 213 mg/dL — AB (ref 70–99)
Glucose-Capillary: 204 mg/dL — ABNORMAL HIGH (ref 70–99)
Glucose-Capillary: 239 mg/dL — ABNORMAL HIGH (ref 70–99)
Glucose-Capillary: 274 mg/dL — ABNORMAL HIGH (ref 70–99)
Glucose-Capillary: 269 mg/dL — ABNORMAL HIGH (ref 70–99)

## 2013-12-07 LAB — TROPONIN I: Troponin I: <0.3 ng/mL (ref <0.30)

## 2013-12-07 MED ORDER — ATROPINE SULFATE 0.1 MG/ML IJ SOLN
INTRAMUSCULAR | Status: AC
  Filled 2013-12-07: qty 10

## 2013-12-07 NOTE — Progress Notes (Signed)
Inpatient Diabetes Program Recommendations  AACE/ADA: New Consensus Statement on Inpatient Glycemic Control (2013)  Target Ranges:  Prepandial:   less than 140 mg/dL      Peak postprandial:   less than 180 mg/dL (1-2 hours)      Critically ill patients:  140 - 180 mg/dL   Results for Wayna ChaletCREED, Yochanan C (MRN [id number]) as of 12/07/2013 11:32  Ref. Range 12/06/2013 08:20 12/06/2013 12:06 12/06/2013 17:34 12/06/2013 21:42 12/07/2013 08:14 12/07/2013 10:50  Glucose-Capillary Latest Range: 70-99 mg/dL 308145 (H) 657218 (H) 846252 (H) 237 (H) 204 (H) 239 (H)    Inpatient Diabetes Program Recommendations Correction (SSI): Please consider increasing Novolog correction to moderate scale and add Novolog bedtime correction scale. Outpatient Referral: MD- At time of discharge, will likely need to re-evaluate DM medications.   Thanks, Orlando PennerMarie Munira Polson, RN, MSN, CCRN Diabetes Coordinator Inpatient Diabetes Program [phone] (Team Pager) [phone] ([facility] office) [phone] [facility] office)

## 2013-12-07 NOTE — Progress Notes (Addendum)
Patient was being seen by OT around 11am, OT took him to bathroom for BM once he got back to chair patient experienced syncopal episodes patient was unresponsive, diaphoretic, and O2 sats dropped to low 30's. Patient started to dry heave and was nauseas. Doctors came and stated to transfer patient to stepdown. Crash cart in room, patient must have pulled IV out because catheter found on bedside table, starting 2 IVs now. Will continue to monitor patient.  Came back in patients room around 1130 and patient had vomit on shirt, diaphoretic, doctors came back to reassess.   Patient transferred to 2H 27 at 121615.

## 2013-12-07 NOTE — Consult Note (Signed)
Travis Coleman is an 80 y.o. male.   Chief Complaint: Syncope HPI: 80 year old male admitted with acute encephalopathy was gradually improving until this AM post BM patient had syncope with diaphoresis and nausea with heart rate in 30's. EKG showed sinus bradycardia with IRBBB. No chest pain or shortness of breath. TSH 1.5 on 12/05/2013  Past Medical History  Diagnosis Date  . Diabetes mellitus   . Hyperlipidemia   . Hypertension   . CAD (coronary artery disease) of bypass graft     Multivessel  . Dementia     MMSE 18/30 Feb 2012 [facility])  . PVD (peripheral vascular disease)   . Cataract     bilateral      Past Surgical History  Procedure Laterality Date  . Coronary artery bypass graft      Family History  Problem Relation Age of Onset  . Diabetes Mother   . Dementia Mother     alzheimer  . Dementia Father   . Stroke Father   . Heart disease Father   . Dementia Sister    Social History:  reports that he quit smoking about 65 years ago. He has never used smokeless tobacco. He reports that he does not drink alcohol or use illicit drugs.  Allergies: No Known Allergies  Medications Prior to Admission  Medication Sig Dispense Refill  . aspirin 81 MG tablet Take 81 mg by mouth daily.        Marland Kitchen atorvastatin (LIPITOR) 20 MG tablet Take 20 mg by mouth at bedtime.      . carvedilol (COREG) 3.125 MG tablet Take 6.25 mg by mouth 2 (two) times daily with meals.       . cholecalciferol (VITAMIN D) 1000 UNITS tablet Take 1,000 Units by mouth daily.        . divalproex (DEPAKOTE) 250 MG EC tablet Take 250 mg by mouth 3 (three) times daily.       Marland Kitchen donepezil (ARICEPT) 10 MG tablet Take 10 mg by mouth at bedtime.        . enalapril (VASOTEC) 2.5 MG tablet Take 2.5 mg by mouth daily.        . fenofibrate (TRICOR) 145 MG tablet Take 145 mg by mouth daily.      . ferrous sulfate 325 (65 FE) MG tablet Take 325 mg by mouth daily with breakfast.      . furosemide (LASIX) 20 MG tablet Take 20  mg by mouth daily.        . insulin aspart (NOVOLOG) 100 UNIT/ML injection Inject 8 Units into the skin 3 (three) times daily before meals. Hold if 80 or less. And if patient does not eat.      . insulin glargine (LANTUS) 100 UNIT/ML injection Inject 70 Units into the skin daily. Hold if blood sugar is less than 70      . LORazepam (ATIVAN) 0.5 MG tablet Take 0.5 mg by mouth 2 (two) times daily.      Marland Kitchen nystatin cream (MYCOSTATIN) Apply 1 application topically 2 (two) times daily.      . risperiDONE (RISPERDAL) 1 MG/ML oral solution Take 0.5 mg by mouth daily.      . rivastigmine (EXELON) 9.5 mg/24hr Place 9.5 mg onto the skin daily.        Results for orders placed during the hospital encounter of 12/04/13 (from the past 48 hour(s))  GLUCOSE, CAPILLARY     Status: Abnormal   Collection Time    12/05/13  8:23 PM      Result Value Range   Glucose-Capillary 248 (*) 70 - 99 mg/dL   Comment 1 Documented in Chart     Comment 2 Notify RN    BASIC METABOLIC PANEL     Status: Abnormal   Collection Time    12/06/13  6:34 AM      Result Value Range   Sodium 142  137 - 147 mEq/L   Potassium 4.7  3.7 - 5.3 mEq/L   Chloride 103  96 - 112 mEq/L   CO2 23  19 - 32 mEq/L   Glucose, Bld 157 (*) 70 - 99 mg/dL   BUN 42 (*) 6 - 23 mg/dL   Creatinine, Ser 1.86 (*) 0.50 - 1.35 mg/dL   Calcium 8.7  8.4 - 10.5 mg/dL   GFR calc non Af Amer 33 (*) >90 mL/min   GFR calc Af Amer 38 (*) >90 mL/min   Comment: (NOTE)     The eGFR has been calculated using the CKD EPI equation.     This calculation has not been validated in all clinical situations.     eGFR's persistently <90 mL/min signify possible Chronic Kidney     Disease.  GLUCOSE, CAPILLARY     Status: Abnormal   Collection Time    12/06/13  8:20 AM      Result Value Range   Glucose-Capillary 145 (*) 70 - 99 mg/dL  GLUCOSE, CAPILLARY     Status: Abnormal   Collection Time    12/06/13 12:06 PM      Result Value Range   Glucose-Capillary 218 (*) 70 -  99 mg/dL  GLUCOSE, CAPILLARY     Status: Abnormal   Collection Time    12/06/13  5:34 PM      Result Value Range   Glucose-Capillary 252 (*) 70 - 99 mg/dL  GLUCOSE, CAPILLARY     Status: Abnormal   Collection Time    12/06/13  9:42 PM      Result Value Range   Glucose-Capillary 237 (*) 70 - 99 mg/dL   Comment 1 Documented in Chart     Comment 2 Notify RN    GLUCOSE, CAPILLARY     Status: Abnormal   Collection Time    12/07/13  8:14 AM      Result Value Range   Glucose-Capillary 204 (*) 70 - 99 mg/dL  GLUCOSE, CAPILLARY     Status: Abnormal   Collection Time    12/07/13 10:50 AM      Result Value Range   Glucose-Capillary 239 (*) 70 - 99 mg/dL  TROPONIN I     Status: None   Collection Time    12/07/13 12:30 PM      Result Value Range   Troponin I <0.30  <0.30 ng/mL   Comment:            Due to the release kinetics of cTnI,     a negative result within the first hours     of the onset of symptoms does not rule out     myocardial infarction with certainty.     If myocardial infarction is still suspected,     repeat the test at appropriate intervals.  GLUCOSE, CAPILLARY     Status: Abnormal   Collection Time    12/07/13 12:44 PM      Result Value Range   Glucose-Capillary 213 (*) 70 - 99 mg/dL  MRSA PCR SCREENING     Status:  None   Collection Time    12/07/13  4:17 PM      Result Value Range   MRSA by PCR NEGATIVE  NEGATIVE   Comment:            The GeneXpert MRSA Assay (FDA     approved for NASAL specimens     only), is one component of a     comprehensive MRSA colonization     surveillance program. It is not     intended to diagnose MRSA     infection nor to guide or     monitor treatment for     MRSA infections.  GLUCOSE, CAPILLARY     Status: Abnormal   Collection Time    12/07/13  4:50 PM      Result Value Range   Glucose-Capillary 274 (*) 70 - 99 mg/dL   No results found.  ROS No weight gain or loss, + CAD, + hypertension, + DM, II, + Dementia, +  CABG, + PVD.  Blood pressure 170/61, pulse 63, temperature 99.3 F (37.4 C), temperature source Oral, resp. rate 20, height 5' 9" (1.753 m), weight 99.4 kg (219 lb 2.2 oz), SpO2 100.00%.  General: sitting up in bed, NAD, wet cloth on forehead.  HEENT: b/l eye mild drainage with yellow crusting on outside  Neck: Faint bruit, No JVD. Cardiac: RRR, II/VI systolic murmur. Pulm: clear to auscultation bilaterally. Abd: soft, nontender, obese, nondistended, BS present  Ext: warm and well perfused, trace pitting edema to BLE  Neuro: alert and oriented X1, cranial nerves II-XII grossly intact, equal strength in b/l upper and lower extremities, responds to commands. Mild action tremor in both hands but stable at rest. Skin-Cool and moist.  Assessment/Plan Syncope Bradyarrhythmia with vagal stimulation Acute encephalopathy Acute on chronic renal failure DM, II Hypertension Dyslipidemia  Agree with transfer to stepdown Apply external pacemaker pads and use if needed. Atropine 0.5 mg. IV if needed. DC coreg. R/O MI. Permanent pacemaker only if bradycardia persist off B-blocker.  Debi Cousin S 12/07/2013, 7:51 PM

## 2013-12-07 NOTE — Evaluation (Addendum)
Occupational Therapy Evaluation Patient Details Name: Travis Coleman MRN: [id number] DOB: November 24, 1931 Today's Date: 12/07/2013 Time: 4098-1191 OT Time Calculation (min): 44 min  OT Assessment / Plan / Recommendation History of present illness 80 yo male [facility] A[facility]. Pt with hx of DM2, CAD with CABG with fluid overload.  [facility]F sent to [facility] due to "shuffling gait" for 1 week   Clinical Impression   PT admitted with AMS from ALF [facility]. Pt currently with functional limitiations due to the deficits listed below (see OT problem list).  Pt will benefit from skilled OT to increase their independence and safety with adls and balance to allow discharge SNF. Pt with posterior lean and unsafe use of RW. Pt will require (A) for all transfers and ADLS. Recommend SNF unless ALF can manage higher level of care daily.     OT Assessment  Patient needs continued OT Services    Follow Up Recommendations  SNF;Supervision/Assistance - 24 hour    Barriers to Discharge      Equipment Recommendations  3 in 1 bedside comode;Wheelchair (measurements OT);Wheelchair cushion (measurements OT);Other (comment) (defer to snf to provide DME)    Recommendations for Other Services    Frequency  Min 2X/week    Precautions / Restrictions Precautions Precautions: Fall Precaution Comments: check HR and oxygen   Pertinent Vitals/Pain HR 29 Oxygen 82 RA Syncopal episode with full body tremor See RN vitals and rapid response note    ADL  Grooming: Wash/dry hands;Minimal assistance Where Assessed - Grooming: Supported sitting Lower Body Bathing: Maximal assistance Where Assessed - Lower Body Bathing: Supported sitting Upper Body Dressing: Minimal assistance Where Assessed - Upper Body Dressing: Supported sitting Lower Body Dressing: +1 Total assistance Where Assessed - Lower Body Dressing: Supported sitting Toilet Transfer: Moderate assistance Toilet Transfer Method: Sit to stand Toilet  Transfer Equipment: Raised toilet seat with arms (or 3-in-1 over toilet) Toileting - Clothing Manipulation and Hygiene: Maximal assistance Where Assessed - Toileting Clothing Manipulation and Hygiene: Sit to stand from 3-in-1 or toilet Equipment Used: Gait belt;Rolling walker Transfers/Ambulation Related to ADLs: Pt ambulating with RW and walking outside RW. pt needed cues for placement inside RW and cues for safety. pt with BIL UE elbow extended with RW pushed too far in advance. Pt unable to turn or navigate RW without max (A) ADL Comments: Pt supine on arrival and verbalized need to use bathroom. Pt required (A) to progress to EOB. pt ambulated to restroom and d/c condom cath. pt required LB bathing and doff /don new socks. pt static standing for peri hygiene and returned to sitting. pt ambulated from bathroom to chair with mod (A). pt once in chair incr respiratory rate. OT applied pulse ox to Rt hand noted vitals to be 82% RA and HR 29. Pt cool and clamy to the touch. Incontinence of bladder during event. Pt noted to have full body tremor. Ot lifting bil LE with chair leg rest. Pt remained unresponsive for ~1 minutes. Pt with no response to initial sternal rub and manual HR unable to be palipated ( HR on pulse ox  was 29 at this time). Rapid response wall alarm called. RN entering room immediately with rapid response RN within 1 min time frame. (See rapid response note). Ot assisted RN staff to transfer patient back to bed using sheet in chair and anterior posterior transfer from chair due to decr arousal state. patients sister present throughout session and provided a soda to help her since she  witnessed entire event. Pt once arousing responding immediately with pleasant tone.     OT Diagnosis: Generalized weakness;Cognitive deficits;Altered mental status  OT Problem List: Decreased strength;Decreased activity tolerance;Impaired balance (sitting and/or standing);Decreased cognition;Decreased safety  awareness;Decreased knowledge of use of DME or AE;Obesity OT Treatment Interventions: Self-care/ADL training;Therapeutic exercise;DME and/or AE instruction;Therapeutic activities;Cognitive remediation/compensation;Patient/family education;Balance training   OT Goals(Current goals can be found in the care plan section) Acute Rehab OT Goals Patient Stated Goal: unable to state OT Goal Formulation: With patient/family Time For Goal Achievement: 12/21/13 Potential to Achieve Goals: Fair  Visit Information  Last OT Received On: 12/07/13 Assistance Needed: +1 History of Present Illness: 80 yo male [facility] A[facility]. Pt with hx of DM2, CAD with CABG with fluid overload.  [facility] for [facility] week       Prior Functioning     Home Living Family/patient expects to be discharged to:: Assisted living Home Equipment: Walker - 2 wheels;Cane - single point Prior Function Level of Independence: Independent with assistive device(s) Communication Communication: No difficulties Dominant Hand: Right         Vision/Perception     Cognition  Cognition Arousal/Alertness: Awake/alert Behavior During Therapy: WFL for tasks assessed/performed Overall Cognitive Status: History of cognitive impairments - at baseline Memory: Decreased short-term memory    Extremity/Trunk Assessment Upper Extremity Assessment Upper Extremity Assessment: Overall WFL for tasks assessed Lower Extremity Assessment Lower Extremity Assessment: Defer to PT evaluation Cervical / Trunk Assessment Cervical / Trunk Assessment: Kyphotic     Mobility Bed Mobility Overal bed mobility: Needs Assistance Bed Mobility: Supine to Sit Supine to sit: Mod assist;HOB elevated General bed mobility comments: Pt required v/c to help sequence task and progress to EOB. pt required pad to scoot to EOB. pt unable to push into sitting without (A) from side to sit Transfers Overall transfer level:  Needs assistance Equipment used: Rolling walker (2 wheeled) Transfers: Sit to/from Stand Sit to Stand: Min guard General transfer comment: Pt with v/c for hand placement and safety. pt with posterior lean. pt with poor positioning in RW without cues from OT          Balance Balance Overall balance assessment: Needs assistance Sitting-balance support: Feet supported;Bilateral upper extremity supported Sitting balance-Leahy Scale: Poor Postural control: Posterior lean   End of Session OT - End of Session Activity Tolerance: Other (comment) (rapid response called) Patient left: in bed;with nursing/sitter in room;Other (comment) (RN staff and rapid response in room) Nurse Communication: Mobility status;Need for lift equipment;Precautions  GO     Harolyn RutherfordJones, Kamsiyochukwu Spickler B 12/07/2013, 11:30 AM Pager: [phone]

## 2013-12-07 NOTE — Significant Event (Signed)
Rapid Response Event Note  Overview:  On unit - called by charge RN to see patient with sudden onset unresponsiveness Time Called: 1052 Arrival Time: 1052 (on unit when event occured) Event Type: Cardiac;Neurologic  Initial Focused Assessment:  On arrival to room patient sitting in chair - eyes open - responds to verbal stimulus - answers questions - follows commands - very pale and cold clammy skin - mae x 4 - speech slow but clear - denies pain - states he feels ok - good pulse - rate 50's.  PT states they had walked to bathroom - his condom cath had come off -she cleaned him up - he had BM on toilet - walked back to bed - upon sitting down to chair patient had a 45 sec period of staring off to left side and some mild body tremors.  He then responded clearly with speech - movement and following commands.  Patient states he felt like he passed out.    Interventions:  Placed on heart monitor - placed on nasal cannula O2 - suction set up to room.  BP 205/67 HR 59 RR 12 O2 sats 98% on 3 liter nasal cannula.  CBG 239.  Patient transferred to bed via 6 person sheet lift.  Tol well.  157/74 59 22 97%  After few minutes in bed - patient c/o nausea - with wretching - HR blocked to 34 - patient cold clammy - after decrease of wretching HR quickly up to 80 and 90 SR.  Strips reviewed - some missed QRS beats following Pwave noted - stat 12 lead ordered.  Dr. Marilu FavreQuershi and TS team present.  Strips shown to them in CHL.  Patient alert - denies SOB or CP.  MD team present.  Handoff to Amer[employer] CorporationKrystal RN  - call as needed.     Event Summary: Name of Physician Notified: Dr. Gareth EagleQureschi at 1052    at    Outcome: Transferred (Comment) (SDU)  Event End Time: 1120  Delton PrairieBritt, Sona Nations L

## 2013-12-07 NOTE — Progress Notes (Signed)
Internal Medicine Attending  Date: 12/07/2013  Patient name: Travis Coleman Medical record number: [id number] Date of birth: 1932-05-06 Age: 80 y.o. Gender: male  I saw and evaluated the patient. I reviewed the resident's note by Dr. Darci Needlehikowski and I agree with the resident's findings and plans as documented in her note.

## 2013-12-07 NOTE — Progress Notes (Signed)
Subjective: [name] was in good spirits and very stable this morning. However, I was called to bedside around 11AM this morning for syncopal episode. Patient had been working with PT and sat down in a chair when he was found to be unresponsive, diaphoretic, with HR in the 30s. Patient became nauseated and started retching. He was put back into bed. HR recovered to the high 50s/lwo 60s. BP 180/52 R arm and 157/73 L arm at this time. Dr Virgina OrganQureshi was paged again at around 11:45am and told by nursing staff that patient was nauseated again w/ HR in 30s. I called cardiology and they will evaluate patient.  Objective: Vital signs in last 24 hours: Filed Vitals:   12/07/13 1100 12/07/13 1113 12/07/13 1114 12/07/13 1136  BP: 157/74 157/73 180/52 205/47  Pulse: 59 60 60 62  Temp:      TempSrc:      Resp:  22 22 18   Weight:      SpO2:    100%   Weight change:   Intake/Output Summary (Last 24 hours) at 12/07/13 1141 Last data filed at 12/06/13 1500  Gross per 24 hour  Intake      0 ml  Output    450 ml  Net   -450 ml   Physical Exam General: sitting up in bed, NAD, wet cloth on forehead HEENT: b/l eye mild drainage with yellow crusting on outside Cardiac: RRR, no M/G/R Pulm: limited exam as patient unable to raise up out of bed but clear to auscultation bilaterally anteriorly Abd: soft, nontender, obese, nondistended, BS present Ext: warm and well perfused, trace pitting edema to BLE Neuro: alert and oriented X1, cranial nerves II-XII grossly intact, equal strength in b/l upper and lower extremities, responds to commands. Mild action tremor in both hands but stable at rest  Lab Results: Basic Metabolic Panel:  Recent Labs Lab 12/04/13 1429  12/05/13 0552 12/06/13 0634  NA 138  --  145 142  K 6.0*  < > 4.4 4.7  CL 102  --  105 103  CO2 22  --  24 23  GLUCOSE 135*  --  53* 157*  BUN 47*  --  40* 42*  CREATININE 2.42*  --  2.06* 1.86*  CALCIUM 8.7  --  8.7 8.7  MG  --   --  2.2   --   < > = values in this interval not displayed.  Liver Function Tests:  Recent Labs Lab 12/04/13 1429  AST 48*  ALT 41  ALKPHOS 34*  BILITOT 0.4  PROT 6.9  ALBUMIN 3.4*   CBC:  Recent Labs Lab 12/04/13 1429 12/05/13 0552  WBC 6.0 5.6  HGB 11.3* 12.0*  HCT 34.4* 36.2*  MCV 96.9 95.0  PLT 148* 157   Cardiac Enzymes:  Recent Labs Lab 12/04/13 2125 12/05/13 0552  TROPONINI <0.30 <0.30   BNP:  Recent Labs Lab 12/04/13 2125  PROBNP 442.5   CBG:  Recent Labs Lab 12/06/13 0820 12/06/13 1206 12/06/13 1734 12/06/13 2142 12/07/13 0814 12/07/13 1050  GLUCAP 145* 218* 252* 237* 204* 239*   Hemoglobin A1C:  Recent Labs Lab 12/04/13 2125  HGBA1C 6.2*   Fasting Lipid Panel:  Recent Labs Lab 12/04/13 2125  CHOL 175  HDL 20*  LDLCALC 119*  TRIG 180*  CHOLHDL 8.8   Thyroid Function Tests:  Recent Labs Lab 12/04/13 2125  TSH 1.528   Urine Drug Screen: Drugs of Abuse     Component Value Date/Time  LABOPIA NONE DETECTED 12/25/2010 2137   COCAINSCRNUR NONE DETECTED 12/25/2010 2137   LABBENZ NONE DETECTED 12/25/2010 2137   AMPHETMU NONE DETECTED 12/25/2010 2137   THCU NONE DETECTED 12/25/2010 2137   LABBARB  Value: NONE DETECTED        DRUG SCREEN FOR MEDICAL PURPOSES ONLY.  IF CONFIRMATION IS NEEDED FOR ANY PURPOSE, NOTIFY LAB WITHIN 5 DAYS.        LOWEST DETECTABLE LIMITS FOR URINE DRUG SCREEN Drug Class       Cutoff (ng/mL) Amphetamine      1000 Barbiturate      200 Benzodiazepine   200 Tricyclics       300 Opiates          300 Cocaine          300 THC              50 12/25/2010 2137    Urinalysis:  Recent Labs Lab 12/04/13 1450  COLORURINE YELLOW  LABSPEC 1.015  PHURINE 5.0  GLUCOSEU NEGATIVE  HGBUR NEGATIVE  BILIRUBINUR NEGATIVE  KETONESUR NEGATIVE  PROTEINUR NEGATIVE  UROBILINOGEN 0.2  NITRITE NEGATIVE  LEUKOCYTESUR NEGATIVE   Studies/Results: No results found. Medications: I have reviewed the patient's current medications. Scheduled  Meds: . aspirin  81 mg Oral Daily  . atorvastatin  20 mg Oral QHS  . cholecalciferol  1,000 Units Oral Daily  . divalproex  250 mg Oral TID  . donepezil  10 mg Oral QHS  . enalapril  2.5 mg Oral Daily  . ferrous sulfate  325 mg Oral Q breakfast  . furosemide  20 mg Oral Daily  . heparin  5,000 Units Subcutaneous Q8H  . insulin aspart  0-9 Units Subcutaneous TID WC  . nystatin cream  1 application Topical BID  . sodium chloride  3 mL Intravenous Q12H   Continuous Infusions:  PRN Meds:.  Assessment/Plan:  Syncope: Patient with syncopal episode this morning, +diaphoresis and nausea w/ retching. Tele strip showed some kind of AV block with HR in the 30s, though difficult to discern what type of AV block is present. EKG showed sinus bradycardia (50s) and a first degree AV block, no ischemic changes. Cardiology was consulted and will evaluate patient. ACS vs vasovagal vs symptomatic AV block. -transfer to SDU -placed external pacing pads -atropine if needed for continued symptomatic bradycardia -monitor HR closely -d/c coreg -cycle troponins -cardiology consult, appreciate their recommendations  Acute encephalopathy in setting of dementia with ?shuffling gait per ALF staff--seems to be back to baseline. PT  Worked with patient and recommended SNF placement. Will need to set this up, though will need cardiology work up first. -holding Risperdal and exelon -continue depakote and aricept -appreciate support of CSW for eventual placement  Volume overload--Stable. Patient appears euvolemic now. Weight is 214 today, which is down from 228 on admission.  -holding home dose lasix 20mg  qd in setting of syncopal episode -holding coreg in setting of possible symptomatic AV block -continue home lipitor, ASA  Acute on chronic renal failure--Cr trended down nicely after diuresis over the weekend. Initially held ACEi but resumed given hypertension and improvement in renal function.  -continue  enalapril 2.5mg  qd but will continue to hold fenofibrate   DM type 2: A1c on admission was 6.2%. He is perhaps too well controlled for his age and chronic disease. Patient is on lantus 70U qdaily and aspart 8U TID with meals at home, however likely too high for him. CBGs ranged 204-252 overnight and he  is requiring very little SSI. -no lantus for now, can consider adding if CBGs remain elevated -SSI, sensitive   HLD: TG 180 with cholesterol 175, HDL 20 and LDL 19. On lipitor 20mg  daily and fenofibrate 160mg  daily at home  -continue lipitor for now -holding fenofibrate 2/2 AKI on admission, likely resume on discharge  HTN: BPs elevated as were holding ACEi on admission due to AKI. Now with AV block, will d/c coreg. Will follow up with cardiology for new antihypertensive regimen. -d/c coreg -continue home enalapril 2.5mg  qd -my need to add on another agent if BPs remain uncontrolled   VTE: heparin  Diet: carb mod diet Code status: full  Dispo: Disposition is deferred at this time, awaiting improvement of current medical problems.  Anticipated discharge in approximately 1-2 day(s).   The patient does have a current PCP Dois Davenport, MD) and does not need an [facility] hospital follow-up appointment after discharge.  The patient does not have transportation limitations that hinder transportation to clinic appointments.  Services Needed at time of discharge: Y = Yes, Blank = No PT: SNF vs. ALF if able to provide 24 hour assist  OT:   RN:   Equipment:   Other:     LOS: 3 days   Windell Hummingbird, MD 12/07/2013, 11:41 AM

## 2013-12-07 NOTE — Progress Notes (Signed)
Pt morning Bp is elevated. MD floor cover made known. Pt Bp the same from baseline since Enalapril is on hold. Pt has no c/o headaches, appears well rested, no distress noted. Will continue to monitor.

## 2013-12-08 DIAGNOSIS — E8779 Other fluid overload: Secondary | ICD-10-CM | POA: Diagnosis not present

## 2013-12-08 DIAGNOSIS — N179 Acute kidney failure, unspecified: Secondary | ICD-10-CM | POA: Diagnosis not present

## 2013-12-08 DIAGNOSIS — G9340 Encephalopathy, unspecified: Secondary | ICD-10-CM | POA: Diagnosis not present

## 2013-12-08 DIAGNOSIS — E1169 Type 2 diabetes mellitus with other specified complication: Secondary | ICD-10-CM | POA: Diagnosis not present

## 2013-12-08 LAB — GLUCOSE, CAPILLARY
GLUCOSE-CAPILLARY: 221 mg/dL — AB (ref 70–99)
GLUCOSE-CAPILLARY: 296 mg/dL — AB (ref 70–99)
Glucose-Capillary: 268 mg/dL — ABNORMAL HIGH (ref 70–99)
Glucose-Capillary: 241 mg/dL — ABNORMAL HIGH (ref 70–99)

## 2013-12-08 LAB — BASIC METABOLIC PANEL
BUN: 60 mg/dL — ABNORMAL HIGH (ref 6–23)
CALCIUM: 8.4 mg/dL (ref 8.4–10.5)
CO2: 22 meq/L (ref 19–32)
Chloride: 107 mEq/L (ref 96–112)
Creatinine, Ser: 2.14 mg/dL — ABNORMAL HIGH (ref 0.50–1.35)
GFR calc Af Amer: 32 mL/min — ABNORMAL LOW (ref >90)
GFR, EST NON AFRICAN AMERICAN: 28 mL/min — AB (ref >90)
Glucose, Bld: 219 mg/dL — ABNORMAL HIGH (ref 70–99)
POTASSIUM: 4.9 meq/L (ref 3.7–5.3)
SODIUM: 143 meq/L (ref 137–147)

## 2013-12-08 LAB — TROPONIN I

## 2013-12-08 MED ORDER — ISOSORB DINITRATE-HYDRALAZINE 20-37.5 MG PO TABS
1.0000 | ORAL_TABLET | Freq: Three times a day (TID) | ORAL | Status: DC
  Administered 2013-12-08 – 2013-12-09 (×3): 1 via ORAL
  Filled 2013-12-08 (×4): qty 1

## 2013-12-08 MED ORDER — ENALAPRIL MALEATE 5 MG PO TABS
5.0000 mg | ORAL_TABLET | Freq: Every day | ORAL | Status: DC
  Administered 2013-12-08 – 2013-12-09 (×2): 5 mg via ORAL
  Filled 2013-12-08 (×2): qty 1

## 2013-12-08 MED ORDER — ISOSORB DINITRATE-HYDRALAZINE 20-37.5 MG PO TABS
1.0000 | ORAL_TABLET | Freq: Two times a day (BID) | ORAL | Status: DC
  Administered 2013-12-08: 1 via ORAL
  Filled 2013-12-08 (×3): qty 1

## 2013-12-08 MED ORDER — INSULIN ASPART 100 UNIT/ML ~~LOC~~ SOLN
0.0000 [IU] | Freq: Three times a day (TID) | SUBCUTANEOUS | Status: DC
  Administered 2013-12-08: 5 [IU] via SUBCUTANEOUS
  Administered 2013-12-08 (×2): 8 [IU] via SUBCUTANEOUS
  Administered 2013-12-09 (×2): 5 [IU] via SUBCUTANEOUS

## 2013-12-08 NOTE — Progress Notes (Signed)
[facility] [place] [facility] [place] CSW called ALF and asked if they have reviewed clinicals to determine if they can take him back. ALF states in OT note, they are recommending a wheelchair and bedside commode. CSW read PT note from today stating pt walked 100 feet with a walker, and ALF asked for this updated note as well. Admissions states facility might want to come assess pt, and they will do this as early as possible tomorrow morning if they do. CSW has sent most recent PT note on [facility] and is waiting to hear from facility tomorrow.   Travis LabradorJulie Naven Coleman, MSW, [facility] CenterCSWA Clinical Social Worker [phone]

## 2013-12-08 NOTE — Progress Notes (Signed)
Clinical Social Work Department BRIEF PSYCHOSOCIAL ASSESSMENT 12/08/2013  Patient:  Travis Coleman,Travis Coleman     Account Number:  [id number]     Admit date:  12/04/2013  Clinical Social Worker:  Varney BilesANDERSON,Francis Doenges, LCSWA  Date/Time:  12/08/2013 04:07 PM  Referred by:  Physician  Date Referred:  12/08/2013 Referred for  ALF Placement   Other Referral:   Interview type:  Other - See comment Other interview type:   Facility    PSYCHOSOCIAL DATA Living Status:  FACILITY [facility] Level of care:  Assisted Living Primary support name:  Arrie AranJames Barretta ([phone]) Primary support relationship to patient:  SIBLING Degree of support available:   Unable to assess. [facility].    CURRENT CONCERNS Current Concerns  Post-Acute Placement   Other Concerns:    SOCIAL WORK ASSESSMENT / PLAN CSW notified by MD that pt is discharging tomorrow and is [facility]. CSW called pt's brother to [facility] and explain CSW role in discharge. CSW called ALF and verified, and explained that CSW saw a note in the chart from pt's previous CSW that facility is reviewing clinicals to determine if they can take him back. Facility states they don't usually provide a bed-side commode and they saw that OT is recommending this and a seat cushion and wheelchair. CSW read most recent PT note from today stating pt walked 100 feet with min asisst and a walker, and sent this to ALF to review. Admissions states they might want to come evaluate pt tomorrow morning before they determine they can take him back, and they will do this first-thing if able. CSW will follow up with facility tomorrow.   Assessment/plan status:  Psychosocial Support/Ongoing Assessment of Needs Other assessment/ plan:   Information/referral to community resources:   ALF [facility]    PATIENT'S/FAMILY'S RESPONSE TO PLAN OF CARE: N/A       Maryclare LabradorJulie Henrik Orihuela, MSW,  [facility]WA Clinical Social Worker [phone]

## 2013-12-08 NOTE — Progress Notes (Signed)
Internal Medicine Attending  Date: 12/08/2013  Patient name: Travis Coleman Medical record number: [id number] Date of birth: [birth year] Age: 80 y.o. Gender: male  I saw and evaluated the patient, and discussed his care on A.M rounds with housestaff.  I reviewed the resident's note by Dr. Darci Needlehikowski and I agree with the resident's findings and plans as documented in her note.

## 2013-12-08 NOTE — Consult Note (Signed)
Subjective:  Feeling better. Heart rate in 50-60's, sinus rhythm.  Objective:  Vital Signs in the last 24 hours: Temp:  [97.6 F (36.4 C)-99.3 F (37.4 C)] 98.6 F (37 C) (01/20 0802) Pulse Rate:  [59-99] 99 (01/20 0802) Cardiac Rhythm:  [-] Normal sinus rhythm (01/20 0800) Resp:  [18-22] 19 (01/20 0802) BP: (157-205)/(32-74) 193/48 mmHg (01/20 0802) SpO2:  [88 %-100 %] 96 % (01/20 0802) Weight:  [99.4 kg (219 lb 2.2 oz)-102.2 kg (225 lb 5 oz)] 102.2 kg (225 lb 5 oz) (01/20 0423)  Physical Exam: BP Readings from Last 1 Encounters:  12/08/13 193/48     Wt Readings from Last 1 Encounters:  12/08/13 102.2 kg (225 lb 5 oz)    Weight change: 1.9 kg (4 lb 3 oz)  HEENT: [place]/AT, Eyes- Conjunctiva-Pink, Sclera-Non-icteric Neck: No JVD, No bruit, Trachea midline. Lungs:  Clear, Bilateral. Cardiac:  Regular rhythm, normal S1 and S2, no S3. II/VI systolic murmur. Abdomen:  Soft, non-tender. Extremities:  1 + edema of lower leg present. No cyanosis. No clubbing. CNS: AxOx1, Cranial nerves grossly intact, moves all 4 extremities.  Skin: Warm and dry.   Intake/Output from previous day: 01/19 0701 - 01/20 0700 In: 60 [P.O.:60] Out: -     Lab Results: BMET    Component Value Date/Time   NA 143 12/08/2013 0300   K 4.9 12/08/2013 0300   CL 107 12/08/2013 0300   CO2 22 12/08/2013 0300   GLUCOSE 219* 12/08/2013 0300   BUN 60* 12/08/2013 0300   CREATININE 2.14* 12/08/2013 0300   CALCIUM 8.4 12/08/2013 0300   GFRNONAA 28* 12/08/2013 0300   GFRAA 32* 12/08/2013 0300   CBC    Component Value Date/Time   WBC 5.6 12/05/2013 0552   RBC 3.81* 12/05/2013 0552   HGB 12.0* 12/05/2013 0552   HCT 36.2* 12/05/2013 0552   PLT 157 12/05/2013 0552   MCV 95.0 12/05/2013 0552   MCH 31.5 12/05/2013 0552   MCHC 33.1 12/05/2013 0552   RDW 13.9 12/05/2013 0552   LYMPHSABS 2.0 11/20/2013 1110   MONOABS 0.6 11/20/2013 1110   EOSABS 0.2 11/20/2013 1110   BASOSABS 0.0 11/20/2013 1110   CARDIAC ENZYMES Lab Results   Component Value Date   CKTOTAL 796* 12/26/2010   CKMB 10.9 CRITICAL RESULT CALLED TO, READ BACK BY AND VERIFIED WITH: B.RUDIN,RN 12/26/10 1054 BY BSLADE* 12/26/2010   TROPONINI <0.30 12/07/2013    Scheduled Meds: . aspirin  81 mg Oral Daily  . atorvastatin  20 mg Oral QHS  . cholecalciferol  1,000 Units Oral Daily  . divalproex  250 mg Oral TID  . donepezil  10 mg Oral QHS  . enalapril  5 mg Oral Daily  . ferrous sulfate  325 mg Oral Q breakfast  . heparin  5,000 Units Subcutaneous Q8H  . insulin aspart  0-15 Units Subcutaneous TID WC  . isosorbide-hydrALAZINE  1 tablet Oral BID  . nystatin cream  1 application Topical BID  . sodium chloride  3 mL Intravenous Q12H   Continuous Infusions:  PRN Meds:.  Assessment/Plan: Syncope  Bradyarrhythmia with vagal stimulation  Acute encephalopathy  Acute on chronic renal failure  DM, II  Hypertension  Dyslipidemia  May resume small dose coreg like 3.125 mg. bid only if heart rate above 70 bpm. May transfer to telemetry bed if bed is needed. No additional cardiac investigation at this time. Add Isosorbide-hydralazine for blood pressure control.   LOS: 4 days    Orpah CobbAjay Alanee Ting  MD  12/08/2013, 9:01 AM

## 2013-12-08 NOTE — Progress Notes (Signed)
Notified physician about pt's SBP of 191. No new orders received for HTN, pt. normally hypertensive. Orders for decreased frequency in neuro checks Q4hrs now received from Dr. Darci Needlehikowski. Jorge NyHayes, Caidance Sybert [place]

## 2013-12-08 NOTE — Progress Notes (Signed)
Inpatient Diabetes Program Recommendations  AACE/ADA: New Consensus Statement on Inpatient Glycemic Control (2013)  Target Ranges:  Prepandial:   less than 140 mg/dL      Peak postprandial:   less than 180 mg/dL (1-2 hours)      Critically ill patients:  140 - 180 mg/dL   Results for Travis Coleman, Travis Coleman (MRN [id number]) as of 12/08/2013 08:05  Ref. Range 12/07/2013 08:14 12/07/2013 10:50 12/07/2013 12:44 12/07/2013 16:50 12/07/2013 21:26  Glucose-Capillary Latest Range: 70-99 mg/dL 010204 (H) 272239 (H) 536213 (H) 274 (H) 269 (H)   MD, please consider adding a portion of patient's Lantus.  Per med rec patient takes Lantus 70 units. Thank you  Piedad ClimesGina Maizee Reinhold BSN, RN,CDE Inpatient Diabetes Coordinator [phone] (team pager)

## 2013-12-08 NOTE — Discharge Summary (Signed)
Name: Travis Coleman MRN: [id number] DOB: [birth year] 80 y.o. PCP: Dois DavenportKaren L Richter, MD  Date of Admission: 12/04/2013  2:08 PM Date of Discharge: 12/09/2013 Attending Physician: Farley LyJerry Dale Joines, MD  Discharge Diagnosis: Principal Problem:   Syncope Active Problems:   Hypertension   Diabetes mellitus type 2 with complications   CAD (coronary artery disease) s/p CABG   Hyperlipidemia   Dementia   Person living in residential institution   Fluid overload  Discharge Medications:   Medication List    STOP taking these medications       carvedilol 3.125 MG tablet  Commonly known as:  COREG     fenofibrate 145 MG tablet  Commonly known as:  TRICOR     risperiDONE 1 MG/ML oral solution  Commonly known as:  RISPERDAL     rivastigmine 9.5 mg/24hr  Commonly known as:  EXELON      TAKE these medications       aspirin 81 MG tablet  Take 81 mg by mouth daily.     atorvastatin 20 MG tablet  Commonly known as:  LIPITOR  Take 20 mg by mouth at bedtime.     cholecalciferol 1000 UNITS tablet  Commonly known as:  VITAMIN D  Take 1,000 Units by mouth daily.     divalproex 250 MG DR tablet  Commonly known as:  DEPAKOTE  Take 250 mg by mouth 3 (three) times daily.     donepezil 10 MG tablet  Commonly known as:  ARICEPT  Take 10 mg by mouth at bedtime.     enalapril 2.5 MG tablet  Commonly known as:  VASOTEC  Take 2 tablets (5 mg total) by mouth daily.     ferrous sulfate 325 (65 FE) MG tablet  Take 325 mg by mouth daily with breakfast.     furosemide 20 MG tablet  Commonly known as:  LASIX  Take 20 mg by mouth daily.     insulin aspart 100 UNIT/ML injection  Commonly known as:  novoLOG  Inject 8 Units into the skin 3 (three) times daily before meals. Hold if 80 or less. And if patient does not eat.     insulin glargine 100 UNIT/ML injection  Commonly known as:  LANTUS  Inject 0.05 mLs (5 Units total) into the skin daily. Hold if blood sugar is less than 70       isosorbide-hydrALAZINE 20-37.5 MG per tablet  Commonly known as:  BIDIL  Take 1 tablet by mouth 3 (three) times daily.     LORazepam 0.5 MG tablet  Commonly known as:  ATIVAN  Take 0.5 mg by mouth 2 (two) times daily.     nystatin cream  Commonly known as:  MYCOSTATIN  Apply 1 application topically 2 (two) times daily.        Disposition and follow-up:   Travis Coleman [facility] St[facility].  At the hospital follow up visit please address:  1.  Patient required only approximately 21U of SSI (aspart) daily without any lantus. He was originally on lantus 70U daily upon admission, though this was held and patient did not require. He was discharged on lantus 5U qdaily w/ his home SSI. Please evaluate if better glucose control is needed. Of note, his Al1 this admission was 6.2%. 2. Patient found to have symptomatic bradycardia w/ AV block that was found after pt had syncopal episode. Please evaluate w/ EKG and for any further symptomatic episodes.  2.  Labs / imaging needed at time of follow-up: BMP (creatinine)  3.  Pending labs/ test needing follow-up: none  Follow-up Appointments: Follow-up Information   Follow up with Dois Davenport., MD On 12/15/2013. (2:00pm )    Specialty:  Family Medicine   Contact information:   [address] [place] [address] [phone]       Follow up with Ricki Rodriguez, MD On 12/31/2013. (11:30am)    Specialty:  Cardiology   Contact information:   [address] [place] [address] [phone]       Discharge Instructions: Discharge Orders   Future Orders Complete By Expires   Call MD for:  difficulty breathing, headache or visual disturbances  As directed    Call MD for:  extreme fatigue  As directed    Call MD for:  persistant dizziness or light-headedness  As directed    Call MD for:  severe uncontrolled pain  As directed    Call MD for:   temperature >100.4  As directed    Diet - low sodium heart healthy  As directed    Increase activity slowly  As directed       Consultations: Treatment Team:  Ricki Rodriguez, MD (cardiology)  Procedures Performed:  Dg Chest 1 View  12/04/2013   CLINICAL DATA:  Volume overload.  Altered mental status.  EXAM: CHEST - 1 VIEW  COMPARISON:  Chest x-ray 11/20/2013.  FINDINGS: Lung volumes are low. Bibasilar opacities may reflect areas of atelectasis and/or consolidation. Possible small left pleural effusion. Cephalization of the pulmonary vasculature, with mild indistinctness of interstitial markings, suggesting mild interstitial pulmonary edema. Mild cardiomegaly. Upper mediastinal contours are distorted by lordotic positioning, but are otherwise within normal limits. Status post median sternotomy for CABG.  IMPRESSION: 1. The appearance of the chest suggests early congestive heart failure. 2. Low lung volumes with probable bibasilar subsegmental atelectasis and small left pleural effusion.   Electronically Signed   By: Trudie Reed M.D.   On: 12/04/2013 19:17   Ct Head Wo Contrast  12/04/2013   CLINICAL DATA:  Altered mental status  EXAM: CT HEAD WITHOUT CONTRAST  TECHNIQUE: Contiguous axial images were obtained from the base of the skull through the vertex without intravenous contrast.  COMPARISON:  Noncontrast CT scan of brain dated November 20, 2013.  FINDINGS: There is stable moderate diffuse cerebral and cerebellar atrophy with compensatory ventriculomegaly. There is no evidence of an acute intracranial hemorrhage nor of an evolving ischemic infarction. Stable decreased density in the deep white matter of both cerebral hemispheres as consistent with chronic small vessel ischemic type change. The cerebellum and brainstem are normal in density.  At bone window settings the observed portions of the paranasal sinuses and mastoid air cells exhibit no air-fluid levels. There is minimal mucoperiosteal  thickening within ethmoid sinus cells. There is no evidence of an acute skull fracture.  IMPRESSION: 1. There is no evidence of an acute intracranial hemorrhage nor of an evolving ischemic infarction. 2. There is no intracranial mass effect or hydrocephalus. 3. There is stable moderate diffuse cerebral and cerebellar atrophy and white matter density hypodensity consistent with chronic small vessel ischemic type change.   Electronically Signed   By: David  [place]   On: 12/04/2013 15:24   Ct Head Wo Contrast  11/20/2013   CLINICAL DATA:  Altered mental status.  EXAM: CT HEAD WITHOUT CONTRAST  TECHNIQUE: Contiguous axial images were obtained from the base of the skull  through the vertex without intravenous contrast.  COMPARISON:  CT scan of December 25, 2010.  FINDINGS: Bony calvarium is intact. Diffuse cortical atrophy is noted. Chronic ischemic white matter disease is noted. No mass effect or midline shift is noted. Ventricular size is within normal limits. There is no evidence of mass lesion, hemorrhage or acute infarction.  IMPRESSION: Diffuse cortical atrophy. Chronic ischemic white matter disease. No acute intracranial abnormality seen.   Electronically Signed   By: Roque Lias M.D.   On: 11/20/2013 12:13   Dg Chest Port 1 View  11/20/2013   CLINICAL DATA:  Altered mental status.  Weakness.  EXAM: PORTABLE CHEST - 1 VIEW  COMPARISON:  11/17/2011.  FINDINGS: Post CABG.  Cardiomegaly.  Central pulmonary vascular prominence.  No segmental consolidation, gross pneumothorax or frank pulmonary edema.  Prominence of the mediastinum may be related to the AP technique limiting evaluation for detection of underlying mass.  IMPRESSION: Post CABG.  Cardiomegaly.  No segmental consolidation, gross pneumothorax or frank pulmonary edema.  Prominence of the mediastinum may be related to the AP technique limiting evaluation for detection of underlying mass.   Electronically Signed   By: Bridgett Larsson M.D.   On: 11/20/2013  11:30   2D Echo:  Study Conclusions  - Left ventricle: The cavity size was normal. Systolic function was normal. The estimated ejection fraction was in the range of 55% to 60%. Although no diagnostic regional wall motion abnormality was identified, this possibility cannot be completely excluded on the basis of this study. - Pulmonary arteries: PA peak pressure: 35mm Hg (S).  Admission HPI:  This is a 81yo WM w/ PMH DM type 2, HLD, HTN, CAD s/p CABG, dementia (MMSE 18/30 2012), PVD, bilateral cataracts who presents to ED via [facility].  Patient was accompanied by sister at the time of our interview, though did not know much about patient's baseline. According to her, he seems slightly more confused than usual. Usually he knows his sister, though when she first saw him today in ED he did no recognize her. During our interview patient did recognize sister, he knew his name and his birthdate (after 3 tries)- did not know where he was, the year, or the president. Patient denies CP, SOB or any pain at all. During our evaluation BP 170/54, HR 65, satting 99% on room air. Sister thinks patient has slowly deteriorated over the last 2 years, though more rapidly in the last 2 months. Patient uses a walker to get around at the ALF. Patient was seen in ED for a similar episode of hyperkalemia on January 1- discharged from ED, not admitted.  Patient found to be hyperkalemic in the ED to K 6.0, repeat 5.9. He was given kayexalate 15g. Also started on NS at 125cc/hr. Cr found to be 2.42, though unknown baseline (Cr 2.21 during last ED visit on 1/2, and 2.04 on 12/29). Mild elevation in LFTs, though this was stable since his last ED visit. Hb stable at 11.3. UA negative. Head CT negative.  Of note, patient is on depakote (unknown reason), his level today was low at 28.  According to [facility], they sent patient over to ED for an "abnormal gait" x 1 week now requiring walker.  Hospital Course by problem  list:  Acute encephalopathy in setting of dementia- Patient was brought to ED with ?shuffling gait per ALF staff. Patient was alert and oriented x 1 during his entire hospitalization. He seems to be back to baseline per  his sister. Shuffling gait was never witnessed, though PT worked with patient and recommended SNF placement, therefore he was discharged appropriately to a SNF. We held his home Risperdal and exelon given these medications can cause or exacerbate EPS side effects, which we thought may be the cause of the ? Shuffling gait. We continued depakote and aricept.  Syncope: On HD3, patient had a syncopal episode while working with physical therapy, +diaphoresis and nausea w/ retching. Tele strip showed some kind of AV block with HR in the 30s, though difficult to discern what type of AV block is present. EKG showed sinus bradycardia (50s) and a first degree AV block, no ischemic changes. Cardiology was consulted and will evaluate patient and recommended d/c coreg, increasing his home dose of enalapril to 5mg  daily and adding BIDIL 20-37.5mg  BID for BP control. ACS was ruled out with two negative troponins. Patient was transferred to SDU overnight for close monitoring. He did not experience any other syncopal episodes for the duration of his hospitalization. Telemetry showed some episodes of sinus bradycardia to low 50s during sleep, but no further AV block. Pt found to be orthostatic, though did not appear volume depleted. The orthostatic hypotension did persist through morning of discharge, though patient was asymptomatic. His home lasix was held during the hospitalization given the orthostatic blood pressure. Lasix restarted upon discharge at home dose.  Volume overload--Patient was somewhat volume overloaded on admission physical exam and CXR showed early signs of CHF. He is on lasix 20mg  PO daily at home. He received extra dose of IV lasix 20mg  on admission and diuresed well with improvement of  physical exam. Echo revealed EF 55-60% with no regional wall motion abnormalities identified, normal systolic function, and PA . Weight remained stable. His lasix was held as per above, though restarted on discharge. He did not appear fluid overloaded on exam on morning of discharge. Continued home lipitor, ASA.  Acute on chronic renal failure--Cr 2.42 on admission. Unknown baseline. Cr trended down nicely after diuresis. Initially held ACEi but resumed and actually increased (2.5mg  to 5mg  daily) once Cr improved. On 1/20 his Cr trended up again to 2.14 (unknown reason), though on morning of discharge his Cr had trended down again and was 1.99. Fenofibrate was held during admission and at discharge.   DM type 2: A1c on admission was 6.2%. He is perhaps too well controlled for his age and chronic disease. Patient is on lantus 70U qdaily and aspart 8U TID with meals at home, however likely too high for him. CBGs ranged 204-296 and he required approx 21U SSI during 24hrs prior to discharge. We discharged him on Lantus 5U daily w/ his home SSI. This will need following up by PCP.  HLD: TG 180 with cholesterol 175, HDL 20 and LDL 19. On lipitor 20mg  daily and fenofibrate 160mg  daily at home. We continued lipitor, though held fenofibrate 2/2 AKI on admission.  HTN: BPs were somewhat elevated on HD3/4, though BP improved with increased dose of enalapril and addition of BIDIL. Coreg was held after his syncopal episode and discontinued upon discharge. Lasix was restarted upon discharge.  Discharge Vitals:   BP 158/73  Pulse 72  Temp(Src) 98.6 F (37 C) (Oral)  Resp 20  Ht 5\' 9"  (1.753 m)  Wt 216 lb 0.8 oz (98 kg)  BMI 31.89 kg/m2  SpO2 99%  Discharge Labs:  Results for orders placed during the hospital encounter of 12/04/13 (from the past 24 hour(s))  GLUCOSE, CAPILLARY  Status: Abnormal   Collection Time    12/08/13  5:06 PM      Result Value Range   Glucose-Capillary 296 (*) 70 - 99  mg/dL  GLUCOSE, CAPILLARY     Status: Abnormal   Collection Time    12/08/13 10:25 PM      Result Value Range   Glucose-Capillary 241 (*) 70 - 99 mg/dL  BASIC METABOLIC PANEL     Status: Abnormal   Collection Time    12/09/13  5:00 AM      Result Value Range   Sodium 144  137 - 147 mEq/L   Potassium 4.8  3.7 - 5.3 mEq/L   Chloride 107  96 - 112 mEq/L   CO2 22  19 - 32 mEq/L   Glucose, Bld 240 (*) 70 - 99 mg/dL   BUN 67 (*) 6 - 23 mg/dL   Creatinine, Ser 1.61 (*) 0.50 - 1.35 mg/dL   Calcium 8.6  8.4 - 09.6 mg/dL   GFR calc non Af Amer 30 (*) >90 mL/min   GFR calc Af Amer 35 (*) >90 mL/min  GLUCOSE, CAPILLARY     Status: Abnormal   Collection Time    12/09/13  7:56 AM      Result Value Range   Glucose-Capillary 247 (*) 70 - 99 mg/dL   Comment 1 Documented in Chart     Comment 2 Notify RN    GLUCOSE, CAPILLARY     Status: Abnormal   Collection Time    12/09/13 11:48 AM      Result Value Range   Glucose-Capillary 224 (*) 70 - 99 mg/dL   Comment 1 Documented in Chart     Comment 2 Notify RN      Signed: Windell Hummingbird, MD 12/09/2013, 2:01 PM   Time Spent on Discharge: 35 minutes Services Ordered on Discharge: none Equipment Ordered on Discharge: none

## 2013-12-08 NOTE — Progress Notes (Signed)
Subjective: Travis Coleman was very talkative and pleasant this morning. He was eating breakfast well when I saw him. No complaints- denies CP, SOB, heart palpitations. He says he has no pain or discomfort. Overnight, he had no more syncopal or presyncopal episodes. Telemetry showed a few episodes of sinus bradycardia only with sleeping (HR high 40s-mid 50s), otherwise he was in NSR.   Objective: Vital signs in last 24 hours: Filed Vitals:   12/08/13 0802 12/08/13 1111 12/08/13 1200 12/08/13 1205  BP: 193/48 196/46 209/51 160/82  Pulse: 99 76    Temp: 98.6 F (37 C) 98.7 F (37.1 C)    TempSrc: Oral Oral    Resp: 19 21 21 22   Height:      Weight:      SpO2: 96% 95%     Weight change: 4 lb 3 oz (1.9 kg)  Intake/Output Summary (Last 24 hours) at 12/08/13 1325 Last data filed at 12/07/13 2100  Gross per 24 hour  Intake     60 ml  Output      0 ml  Net     60 ml   Physical Exam General: sitting up in bed, NAD HEENT: NCAT, eyelids remain slightly erythematous bilaterally Cardiac: RRR, no M/G/R Pulm: posterior lung fields CTAB  Abd: soft, nontender, obese, nondistended, BS present Ext: warm and well perfused, no pitting edema to BLE Neuro: alert and oriented X1, cranial nerves II-XII grossly intact, equal strength in b/l upper and lower extremities, responds to commands. Mild action tremor in both hands but stable at rest  Lab Results: Basic Metabolic Panel:  Recent Labs Lab 12/04/13 1429  12/05/13 0552 12/06/13 0634 12/08/13 0300  NA 138  --  145 142 143  K 6.0*  < > 4.4 4.7 4.9  CL 102  --  105 103 107  CO2 22  --  24 23 22   GLUCOSE 135*  --  53* 157* 219*  BUN 47*  --  40* 42* 60*  CREATININE 2.42*  --  2.06* 1.86* 2.14*  CALCIUM 8.7  --  8.7 8.7 8.4  MG  --   --  2.2  --   --   < > = values in this interval not displayed.  Liver Function Tests:  Recent Labs Lab 12/04/13 1429  AST 48*  ALT 41  ALKPHOS 34*  BILITOT 0.4  PROT 6.9  ALBUMIN 3.4*    CBC:  Recent Labs Lab 12/04/13 1429 12/05/13 0552  WBC 6.0 5.6  HGB 11.3* 12.0*  HCT 34.4* 36.2*  MCV 96.9 95.0  PLT 148* 157   Cardiac Enzymes:  Recent Labs Lab 12/05/13 0552 12/07/13 1230 12/08/13 0845  TROPONINI <0.30 <0.30 <0.30   BNP:  Recent Labs Lab 12/04/13 2125  PROBNP 442.5   CBG:  Recent Labs Lab 12/07/13 1050 12/07/13 1244 12/07/13 1650 12/07/13 2126 12/08/13 0804 12/08/13 1114  GLUCAP 239* 213* 274* 269* 221* 268*   Hemoglobin A1C:  Recent Labs Lab 12/04/13 2125  HGBA1C 6.2*   Fasting Lipid Panel:  Recent Labs Lab 12/04/13 2125  CHOL 175  HDL 20*  LDLCALC 119*  TRIG 180*  CHOLHDL 8.8   Thyroid Function Tests:  Recent Labs Lab 12/04/13 2125  TSH 1.528   Urine Drug Screen: Drugs of Abuse     Component Value Date/Time   LABOPIA NONE DETECTED 12/25/2010 2137   COCAINSCRNUR NONE DETECTED 12/25/2010 2137   LABBENZ NONE DETECTED 12/25/2010 2137   AMPHETMU NONE DETECTED 12/25/2010 2137  THCU NONE DETECTED 12/25/2010 2137   LABBARB  Value: NONE DETECTED        DRUG SCREEN FOR MEDICAL PURPOSES ONLY.  IF CONFIRMATION IS NEEDED FOR ANY PURPOSE, NOTIFY LAB WITHIN 5 DAYS.        LOWEST DETECTABLE LIMITS FOR URINE DRUG SCREEN Drug Class       Cutoff (ng/mL) Amphetamine      1000 Barbiturate      200 Benzodiazepine   200 Tricyclics       300 Opiates          300 Cocaine          300 THC              50 12/25/2010 2137    Urinalysis:  Recent Labs Lab 12/04/13 1450  COLORURINE YELLOW  LABSPEC 1.015  PHURINE 5.0  GLUCOSEU NEGATIVE  HGBUR NEGATIVE  BILIRUBINUR NEGATIVE  KETONESUR NEGATIVE  PROTEINUR NEGATIVE  UROBILINOGEN 0.2  NITRITE NEGATIVE  LEUKOCYTESUR NEGATIVE   Studies/Results: No results found. Medications: I have reviewed the patient's current medications. Scheduled Meds: . aspirin  81 mg Oral Daily  . atorvastatin  20 mg Oral QHS  . cholecalciferol  1,000 Units Oral Daily  . divalproex  250 mg Oral TID  . donepezil  10  mg Oral QHS  . enalapril  5 mg Oral Daily  . ferrous sulfate  325 mg Oral Q breakfast  . heparin  5,000 Units Subcutaneous Q8H  . insulin aspart  0-15 Units Subcutaneous TID WC  . isosorbide-hydrALAZINE  1 tablet Oral BID  . nystatin cream  1 application Topical BID  . sodium chloride  3 mL Intravenous Q12H   Continuous Infusions:  PRN Meds:.  Assessment/Plan:  Syncope: No syncopal episodes overnight. Telemetry showed short runs of sinus bradycardia to the high 40s low 50s and showed only 1 dropped QRS complex. Repeat EKG this AM shows NSR. Patient worked with PT and per tech, pt did well and was able to walk with PT. ACS ruled out w/ negative troponins.  -transfer to telemetry  -cardiology's recommendations: increase enalapril to 5mg  daily, add BIDIL 20-37.5 BID; can restart coreg at 3.125mg  BID if HR >70.  -orthostatic vital signs -will continue to hold lasix today  Acute encephalopathy in setting of dementia with ?shuffling gait per ALF staff--seems to be back to baseline. PT worked with patient and recommended SNF placement. Appreciate assistance of CSW- likely to be ready for discharge tomorrow. -holding Risperdal and exelon -continue depakote and aricept  Volume overload--Stable. Patient appears euvolemic on exam. However, weight is 225lbs, which is up from yesterday 214lbs. Wt on admission was 228lbs.  -holding home dose lasix 20mg  qd in setting of syncopal episode and possible orthostasis  -holding coreg in setting of possible symptomatic AV block- likely to discontinue this at discharge -continue home lipitor, ASA  Acute on chronic renal failure- Cr increased again today to 2.14. Perhaps slightly volume overloaded, though it is unclear why his Cr trended up again. - enalapril increased as above - hold fenofibrate   DM type 2: CBGs 213-274. Will increase SSI from sensitive to moderate. -no lantus for now -SSI, moderate  HLD: Stable. Continue statin.  HTN: Elevated BPs  157-170/34-43. Adjusting antihypertensives as per cardiology. -increase enalapril to 5mg  daily -added BIDIL today -d/c coreg   VTE: heparin  Diet: carb mod diet Code status: full  Dispo: Disposition is deferred at this time, awaiting improvement of current medical problems.  Anticipated discharge in  approximately 1-2 day(s).   The patient does have a current PCP Travis Coleman(Karen L Richter, MD) and [facility] not [facility] after discharge.  The patient does not have transportation limitations that hinder transportation to clinic appointments.  Services Needed at time of discharge: Y = Yes, Blank = No PT: SNF vs. ALF if able to provide 24 hour assist  OT:   RN:   Equipment:   Other:     LOS: 4 days   Windell Hummingbirdachel Tashala Cumbo, MD 12/08/2013, 1:25 PM

## 2013-12-08 NOTE — Progress Notes (Signed)
Physical Therapy Treatment Patient Details Name: Travis ChaletDonald C Lamarre MRN: [id number] DOB: Apr 14, 1932 Today's Date: 12/08/2013 Time: [id number] PT Time Calculation (min): 24 min  PT Assessment / Plan / Recommendation  History of Present Illness 80 yo male admitted AMS from [facility]. Pt with hx of DM2, CAD with CABG with fluid overload.  [facility] due to "shuffling gait" for 1 week   PT Comments   Pt with progression in activity today although limited by cognition. Pt with posterior lean on initial standing but improved retropulsion from evaluation. Pt educated for call bell use but unable to demonstrate. Will continue to follow to maximize mobility and safety.  Follow Up Recommendations  SNF;Supervision/Assistance - 24 hour     Does the patient have the potential to tolerate intense rehabilitation     Barriers to Discharge        Equipment Recommendations       Recommendations for Other Services    Frequency     Progress towards PT Goals Progress towards PT goals: Progressing toward goals  Plan Current plan remains appropriate    Precautions / Restrictions Precautions Precautions: Fall   Pertinent Vitals/Pain HR 69-76 BP 198/45 after activity    Mobility  Bed Mobility Bed Mobility: Supine to Sit Supine to sit: Min assist General bed mobility comments: cueing for sequence to roll and transfer from sidely with min assist to elevate trunk from surface Transfers Transfers: Sit to/from Stand Sit to Stand: Min assist General transfer comment: cueing for hand placement and safety with assist for anterior translation with standing from bed and toilet Ambulation/Gait Ambulation/Gait assistance: Min assist Ambulation Distance (Feet): 100 Feet Assistive device: Rolling walker (2 wheeled) Gait Pattern/deviations: Step-through pattern;Decreased stride length Gait velocity interpretation: <1.8 ft/sec, indicative of risk for recurrent falls, 10'/54sec=.185  General Gait Details:  cueing for position in RW, posture and directional cues    Exercises General Exercises - Lower Extremity Long Arc Quad: AROM;Seated;Both;10 reps Hip Flexion/Marching: AROM;Seated;Both;10 reps Toe Raises: AROM;Seated;Both;10 reps   PT Diagnosis:    PT Problem List:   PT Treatment Interventions:     PT Goals (current goals can now be found in the care plan section)    Visit Information  Last PT Received On: 12/08/13 Assistance Needed: +1 History of Present Illness: 80 yo male admitted AMS from [facility]. Pt with hx of DM2, CAD with CABG with fluid overload.  [facility]" for [facility] week    Subjective Data      Cognition  Cognition Arousal/Alertness: Awake/alert Behavior During Therapy: Flat affect Overall Cognitive Status: History of cognitive impairments - at baseline Memory: Decreased short-term memory    Balance     End of Session PT - End of Session Equipment Utilized During Treatment: Gait belt Activity Tolerance: Patient tolerated treatment well Patient left: in chair;with call bell/phone within reach Nurse Communication: Mobility status   GP     Delorse Lekabor, Hamda Klutts Beth 12/08/2013, 1:33 PM Delaney MeigsMaija Tabor Kinzleigh Kandler, PT [phone]

## 2013-12-08 NOTE — Care Management (Signed)
[facility].  Resumption of Care orders requested upon hospital discharge.

## 2013-12-09 LAB — GLUCOSE, CAPILLARY
GLUCOSE-CAPILLARY: 247 mg/dL — AB (ref 70–99)
Glucose-Capillary: 224 mg/dL — ABNORMAL HIGH (ref 70–99)

## 2013-12-09 LAB — BASIC METABOLIC PANEL
BUN: 67 mg/dL — ABNORMAL HIGH (ref 6–23)
CO2: 22 mEq/L (ref 19–32)
Calcium: 8.6 mg/dL (ref 8.4–10.5)
Chloride: 107 mEq/L (ref 96–112)
Creatinine, Ser: 1.99 mg/dL — ABNORMAL HIGH (ref 0.50–1.35)
GFR, EST AFRICAN AMERICAN: 35 mL/min — AB (ref >90)
GFR, EST NON AFRICAN AMERICAN: 30 mL/min — AB (ref >90)
Glucose, Bld: 240 mg/dL — ABNORMAL HIGH (ref 70–99)
Potassium: 4.8 mEq/L (ref 3.7–5.3)
Sodium: 144 mEq/L (ref 137–147)

## 2013-12-09 MED ORDER — LORAZEPAM 0.5 MG PO TABS: 0.5000 mg | ORAL_TABLET | Freq: Two times a day (BID) | ORAL | Status: DC

## 2013-12-09 MED ORDER — INSULIN GLARGINE 100 UNIT/ML ~~LOC~~ SOLN: 5.0000 [IU] | Freq: Every day | SUBCUTANEOUS | Status: DC

## 2013-12-09 MED ORDER — ISOSORB DINITRATE-HYDRALAZINE 20-37.5 MG PO TABS: 1.0000 | ORAL_TABLET | Freq: Three times a day (TID) | ORAL | Status: DC

## 2013-12-09 MED ORDER — FUROSEMIDE 20 MG PO TABS
20.0000 mg | ORAL_TABLET | Freq: Every day | ORAL | Status: DC
  Administered 2013-12-09: 20 mg via ORAL
  Filled 2013-12-09: qty 1

## 2013-12-09 MED ORDER — ENALAPRIL MALEATE 2.5 MG PO TABS: 5.0000 mg | ORAL_TABLET | Freq: Every day | ORAL | Status: DC

## 2013-12-09 NOTE — Progress Notes (Signed)
Subjective:  Feeling better. Heart rate in 70's. Occasional sinus bradycardia in 50's.  Objective:  Vital Signs in the last 24 hours: Temp:  [98.1 F (36.7 C)-99.1 F (37.3 C)] 98.6 F (37 C) (01/21 0601) Pulse Rate:  [68-72] 72 (01/21 0601) Cardiac Rhythm:  [-] Normal sinus rhythm (01/21 0812) Resp:  [20-24] 20 (01/21 0601) BP: (158-192)/(31-73) 158/73 mmHg (01/21 1017) SpO2:  [96 %-99 %] 99 % (01/21 0601) Weight:  [98 kg (216 lb 0.8 oz)] 98 kg (216 lb 0.8 oz) (01/21 0601)  Physical Exam: BP Readings from Last 1 Encounters:  12/09/13 158/73     Wt Readings from Last 1 Encounters:  12/09/13 98 kg (216 lb 0.8 oz)    Weight change: -1.4 kg (-3 lb 1.4 oz)  HEENT: [place]/AT, Eyes- Conjunctiva-Pink, Sclera-Non-icteric Neck: No JVD, No bruit, Trachea midline. Lungs:  Clear, Bilateral. Cardiac:  Regular rhythm, normal S1 and S2, no S3. II/VI systolic murmur. Abdomen:  Soft, non-tender. Extremities:  1 + edema present. No cyanosis. No clubbing. CNS: AxOx1, Cranial nerves grossly intact, moves all 4 extremities. Right handed. Skin: Warm and dry.   Intake/Output from previous day:      Lab Results: BMET    Component Value Date/Time   NA 144 12/09/2013 0500   K 4.8 12/09/2013 0500   CL 107 12/09/2013 0500   CO2 22 12/09/2013 0500   GLUCOSE 240* 12/09/2013 0500   BUN 67* 12/09/2013 0500   CREATININE 1.99* 12/09/2013 0500   CALCIUM 8.6 12/09/2013 0500   GFRNONAA 30* 12/09/2013 0500   GFRAA 35* 12/09/2013 0500   CBC    Component Value Date/Time   WBC 5.6 12/05/2013 0552   RBC 3.81* 12/05/2013 0552   HGB 12.0* 12/05/2013 0552   HCT 36.2* 12/05/2013 0552   PLT 157 12/05/2013 0552   MCV 95.0 12/05/2013 0552   MCH 31.5 12/05/2013 0552   MCHC 33.1 12/05/2013 0552   RDW 13.9 12/05/2013 0552   LYMPHSABS 2.0 11/20/2013 1110   MONOABS 0.6 11/20/2013 1110   EOSABS 0.2 11/20/2013 1110   BASOSABS 0.0 11/20/2013 1110   CARDIAC ENZYMES Lab Results  Component Value Date   CKTOTAL 796* 12/26/2010   CKMB  10.9 CRITICAL RESULT CALLED TO, READ BACK BY AND VERIFIED WITH: B.RUDIN,RN 12/26/10 1054 BY BSLADE* 12/26/2010   TROPONINI <0.30 12/08/2013    Scheduled Meds: . aspirin  81 mg Oral Daily  . atorvastatin  20 mg Oral QHS  . cholecalciferol  1,000 Units Oral Daily  . divalproex  250 mg Oral TID  . donepezil  10 mg Oral QHS  . enalapril  5 mg Oral Daily  . ferrous sulfate  325 mg Oral Q breakfast  . furosemide  20 mg Oral Daily  . heparin  5,000 Units Subcutaneous Q8H  . insulin aspart  0-15 Units Subcutaneous TID WC  . isosorbide-hydrALAZINE  1 tablet Oral TID  . nystatin cream  1 application Topical BID  . sodium chloride  3 mL Intravenous Q12H   Continuous Infusions:  PRN Meds:.  Assessment/Plan: Syncope  Bradyarrhythmia with vagal stimulation  Acute encephalopathy  Acute on chronic renal failure  DM, II  Hypertension  Dyslipidemia  Continue medications and activity.   LOS: 5 days    Orpah CobbAjay Thora Scherman  MD  12/09/2013, 3:23 PM

## 2013-12-09 NOTE — Progress Notes (Signed)
Internal Medicine Attending  Date: 12/09/2013  Patient name: Travis ChaletDonald C Panella Medical record number: [id number] Date of birth: Mar 20, 1932 Age: 80 y.o. Gender: male  I saw and evaluated the patient, and discussed his care with housestaff.  I reviewed the resident's note by Dr. Darci Needlehikowski and I agree with the resident's findings and plans as documented in her note.

## 2013-12-09 NOTE — Progress Notes (Signed)
Clinical Social Work Department CLINICAL SOCIAL WORK PLACEMENT NOTE 12/09/2013  Patient:  Travis Coleman,[name]  Account Number:  [id number] Admit date:  12/04/2013  Clinical Social Worker:  Jetta LoutBAILEY MORGAN, Theresia MajorsLCSWA  Date/time:  12/09/2013 04:14 PM  Clinical Social Work is seeking post-discharge placement for this patient at the following level of care:   SKILLED NURSING   (*CSW will update this form in Epic as items are completed)   12/09/2013  Patient/family provided with [name] Department of Clinical Social Work's list of facilities offering this level of care within the geographic area requested by the patient (or if unable, by the patient's family).  12/09/2013  Patient/family informed of their freedom to choose among providers that offer the needed level of care, that participate in Medicare, Medicaid or managed care program needed by the patient, have an available bed and are willing to accept the patient.  12/09/2013  Patient/family informed of [facility]' ownership interest in [facility], as well as of the fact that they are under no obligation to receive care at this facility.  PASARR submitted to EDS on 12/09/2013 PASARR number received from EDS on 12/09/2013  FL2 transmitted to all facilities in geographic area requested by pt/family on  12/09/2013 FL2 transmitted to all facilities within larger geographic area on   Patient informed that his/her managed care company has contracts with or will negotiate with  certain facilities, including the following:     Patient/family informed of bed offers received:   Patient chooses bed at  Physician recommends and patient chooses bed at    [facility]  12/09/2013 [facility]  The following physician request were entered in Epic:   Additional Comments: Patient is not alert and oriented and patient's sister Alfonso EllisCynthia Perryman makes medical  decisions regarding patient. [facility] could not provide 24 hour supervision and the level of care that he needed. Sister agreeable [place]. [facility]. Clinical Child psychotherapistocial Worker (CSW) prepared D/C packet and arranged non-emergency EMS ([facility]) for transport. Sister is aware of above. Nursing is aware of above. Please reconsult if further social work needs arise. CSW signing off.

## 2013-12-09 NOTE — Progress Notes (Signed)
Report given to [facility].  

## 2013-12-09 NOTE — Progress Notes (Signed)
Subjective: [name] was in good spirits this morning and had no complaints. On telemetry he had one episode of sinus bradycardia to 50s while he was asleep. No more syncopal episodes.   Objective: Vital signs in last 24 hours: Filed Vitals:   12/09/13 0657 12/09/13 0659 12/09/13 0701 12/09/13 1017  BP: 178/69 192/65 159/73 158/73  Pulse:      Temp:      TempSrc:      Resp:      Height:      Weight:      SpO2:       Weight change: -3 lb 1.4 oz (-1.4 kg)  Intake/Output Summary (Last 24 hours) at 12/09/13 1322 Last data filed at 12/09/13 0737  Gross per 24 hour  Intake      0 ml  Output    250 ml  Net   -250 ml   Physical Exam General: sitting up in bed, NAD HEENT: NCAT, eyelids remain slightly erythematous bilaterally with some yellow drainage Cardiac: RRR, no M/G/R Pulm: posterior lung fields CTAB  Abd: soft, nontender, obese, nondistended, BS present Ext: warm and well perfused, no pitting edema to BLE Neuro: alert and oriented X1, cranial nerves II-XII grossly intact, equal strength in b/l upper and lower extremities, responds to commands. Mild action tremor in both hands but stable at rest  Lab Results: Basic Metabolic Panel:  Recent Labs Lab 12/04/13 1429  12/05/13 0552  12/08/13 0300 12/09/13 0500  NA 138  --  145  < > 143 144  K 6.0*  < > 4.4  < > 4.9 4.8  CL 102  --  105  < > 107 107  CO2 22  --  24  < > 22 22  GLUCOSE 135*  --  53*  < > 219* 240*  BUN 47*  --  40*  < > 60* 67*  CREATININE 2.42*  --  2.06*  < > 2.14* 1.99*  CALCIUM 8.7  --  8.7  < > 8.4 8.6  MG  --   --  2.2  --   --   --   < > = values in this interval not displayed.  Liver Function Tests:  Recent Labs Lab 12/04/13 1429  AST 48*  ALT 41  ALKPHOS 34*  BILITOT 0.4  PROT 6.9  ALBUMIN 3.4*   CBC:  Recent Labs Lab 12/04/13 1429 12/05/13 0552  WBC 6.0 5.6  HGB 11.3* 12.0*  HCT 34.4* 36.2*  MCV 96.9 95.0  PLT 148* 157   Cardiac Enzymes:  Recent Labs Lab  12/05/13 0552 12/07/13 1230 12/08/13 0845  TROPONINI <0.30 <0.30 <0.30   BNP:  Recent Labs Lab 12/04/13 2125  PROBNP 442.5   CBG:  Recent Labs Lab 12/08/13 0804 12/08/13 1114 12/08/13 1706 12/08/13 2225 12/09/13 0756 12/09/13 1148  GLUCAP 221* 268* 296* 241* 247* 224*   Hemoglobin A1C:  Recent Labs Lab 12/04/13 2125  HGBA1C 6.2*   Fasting Lipid Panel:  Recent Labs Lab 12/04/13 2125  CHOL 175  HDL 20*  LDLCALC 119*  TRIG 180*  CHOLHDL 8.8   Thyroid Function Tests:  Recent Labs Lab 12/04/13 2125  TSH 1.528   Urine Drug Screen: Drugs of Abuse     Component Value Date/Time   LABOPIA NONE DETECTED 12/25/2010 2137   COCAINSCRNUR NONE DETECTED 12/25/2010 2137   LABBENZ NONE DETECTED 12/25/2010 2137   AMPHETMU NONE DETECTED 12/25/2010 2137   THCU NONE DETECTED 12/25/2010 2137  LABBARB  Value: NONE DETECTED        DRUG SCREEN FOR MEDICAL PURPOSES ONLY.  IF CONFIRMATION IS NEEDED FOR ANY PURPOSE, NOTIFY LAB WITHIN 5 DAYS.        LOWEST DETECTABLE LIMITS FOR URINE DRUG SCREEN Drug Class       Cutoff (ng/mL) Amphetamine      1000 Barbiturate      200 Benzodiazepine   200 Tricyclics       300 Opiates          300 Cocaine          300 THC              50 12/25/2010 2137    Urinalysis:  Recent Labs Lab 12/04/13 1450  COLORURINE YELLOW  LABSPEC 1.015  PHURINE 5.0  GLUCOSEU NEGATIVE  HGBUR NEGATIVE  BILIRUBINUR NEGATIVE  KETONESUR NEGATIVE  PROTEINUR NEGATIVE  UROBILINOGEN 0.2  NITRITE NEGATIVE  LEUKOCYTESUR NEGATIVE   Studies/Results: No results found. Medications: I have reviewed the patient's current medications. Scheduled Meds: . aspirin  81 mg Oral Daily  . atorvastatin  20 mg Oral QHS  . cholecalciferol  1,000 Units Oral Daily  . divalproex  250 mg Oral TID  . donepezil  10 mg Oral QHS  . enalapril  5 mg Oral Daily  . ferrous sulfate  325 mg Oral Q breakfast  . furosemide  20 mg Oral Daily  . heparin  5,000 Units Subcutaneous Q8H  . insulin aspart   0-15 Units Subcutaneous TID WC  . isosorbide-hydrALAZINE  1 tablet Oral TID  . nystatin cream  1 application Topical BID  . sodium chloride  3 mL Intravenous Q12H   Continuous Infusions:  PRN Meds:.  Assessment/Plan:  Syncope: No more syncopal episodes. He remains orthostatic from sitting to standing by SBP, though he does not seem to be symptomatic. One episode of sinus bradycardia overnight while sleeping. -discharge to SNF -continue increased dose of enalapril to 5mg  daily, as well as BIDIL 20-37.5 BID -restart home lasix today  Acute encephalopathy in setting of dementia with ?shuffling gait per ALF staff--back to baseline. PT worked with patient and recommended SNF placement. -SNF placement today -holding Risperdal and exelon- will hold on discahrge as well -continue depakote and aricept  Volume overload--Stable. Patient appears euvolemic on exam. Weight is stable.  -had held home lasix given syncope, though this restarted today -discontinued coreg yesterday and will continue to hold at discharge -continue home lipitor, ASA  Acute on chronic renal failure- Cr improved to 1.99 today from 2.14 yesterday.  - enalapril increased as above - hold fenofibrate   DM type 2: CBGs 241-296. Will discharge with low dose of lantus and SSI. -no lantus for now -SSI, moderate  HLD: Stable. Continue statin.  HTN: BP improved, now 158/73.  -enalapril to 5mg  daily -BIDIL    VTE: heparin  Diet: carb mod diet Code status: full  Dispo: Discharge today.  The patient does have a current PCP Dois Davenport, MD) and does not need an [facility] hospital follow-up appointment after discharge.  The patient does not have transportation limitations that hinder transportation to clinic appointments.  Services Needed at time of discharge: Y = Yes, Blank = No PT: SNF vs. ALF if able to provide 24 hour assist  OT:   RN:   Equipment:   Other:     LOS: 5 days   Windell Hummingbird, MD 12/09/2013,  1:22 PM

## 2013-12-09 NOTE — Discharge Instructions (Signed)
Syncope  Syncope is a fainting spell. This means the person loses consciousness and drops to the ground. The person is generally unconscious for less than 5 minutes. The person may have some muscle twitches for up to 15 seconds before waking up and returning to normal. Syncope occurs more often in elderly people, but it can happen to anyone. While most causes of syncope are not dangerous, syncope can be a sign of a serious medical problem. It is important to seek medical care.   CAUSES   Syncope is caused by a sudden decrease in blood flow to the brain. The specific cause is often not determined. Factors that can trigger syncope include:   Taking medicines that lower blood pressure.   Sudden changes in posture, such as standing up suddenly.   Taking more medicine than prescribed.   Standing in one place for too long.   Seizure disorders.   Dehydration and excessive exposure to heat.   Low blood sugar (hypoglycemia).   Straining to have a bowel movement.   Heart disease, irregular heartbeat, or other circulatory problems.   Fear, emotional distress, seeing blood, or severe pain.  SYMPTOMS   Right before fainting, you may:   Feel dizzy or lightheaded.   Feel nauseous.   See all white or all black in your field of vision.   Have cold, clammy skin.  DIAGNOSIS   Your caregiver will ask about your symptoms, perform a physical exam, and perform electrocardiography (ECG) to record the electrical activity of your heart. Your caregiver may also perform other heart or blood tests to determine the cause of your syncope.  TREATMENT   In most cases, no treatment is needed. Depending on the cause of your syncope, your caregiver may recommend changing or stopping some of your medicines.  HOME CARE INSTRUCTIONS   Have someone stay with you until you feel stable.   Do not drive, operate machinery, or play sports until your caregiver says it is okay.   Keep all follow-up appointments as directed by your  caregiver.   Lie down right away if you start feeling like you might faint. Breathe deeply and steadily. Wait until all the symptoms have passed.   Drink enough fluids to keep your urine clear or pale yellow.   If you are taking blood pressure or heart medicine, get up slowly, taking several minutes to sit and then stand. This can reduce dizziness.  SEEK IMMEDIATE MEDICAL CARE IF:    You have a severe headache.   You have unusual pain in the chest, abdomen, or back.   You are bleeding from the mouth or rectum, or you have black or tarry stool.   You have an irregular or very fast heartbeat.   You have pain with breathing.   You have repeated fainting or seizure-like jerking during an episode.   You faint when sitting or lying down.   You have confusion.   You have difficulty walking.   You have severe weakness.   You have vision problems.  If you fainted, call your local emergency services (911 in [place] Do not drive yourself to the hospital.   MAKE SURE YOU:   Understand these instructions.   Will watch your condition.   Will get help right away if you are not doing well or get worse.  Document Released: 11/05/2005 Document Revised: 05/06/2012 Document Reviewed: 01/04/2012  ExitCare Patient Information 2014 [employer].

## 2013-12-09 NOTE — Progress Notes (Signed)
CSW received call from pt's sister-in-law, stating they have been getting calls about the pt but that his sister is the one who makes medical decisions and takes care of pt. Sister-in-law provided name and number for pt's sister: Alfonso EllisCynthia Perryman [phone]). CSW called pt's new unit CSW and provided this information to him.   Maryclare LabradorJulie Gladyes Kudo, MSW, [facility] Clinical Social Worker [phone]

## 2014-02-22 NOTE — Progress Notes (Signed)
Late entry for G-code. Based on chart review of evaluation by Aida RaiderWendy Garrow, PT on 12/06/13.       12/06/13 1208  PT G-Codes **NOT FOR INPATIENT CLASS**  Functional Assessment Tool Used Clinical judgement based on chart review  Functional Limitation Mobility: Walking and moving around  Mobility: Walking and Moving Around Current Status (W0981(G8978) CL  Mobility: Walking and Moving Around Goal Status [phone](G8979) CI   Lavona MoundMark Lindaann Gradilla, PT  [phone] 02/22/2014

## 2014-03-11 NOTE — Progress Notes (Signed)
OT NOTE- LATE GCODE ENTRY   12/07/13 1100  OT G-codes **NOT FOR INPATIENT CLASS**  Functional Assessment Tool Used clinical judgement  Functional Limitation Self care  Self Care Current Status (F7510(G8987) CM  Self Care Goal Status (C5852(G8988) CM          Mateo FlowJones, Brynn   OTR/L Pager: [phone] Office: [phone] .

## 2014-03-16 NOTE — Progress Notes (Addendum)
Addendum- Late SLP entry   12/06/13 1502  SLP G-Codes **NOT FOR INPATIENT CLASS**  Functional Assessment Tool Used skilled clinical judgememt  Functional Limitations Swallowing  Swallow Current Status (Z6109(G8996) CI  Swallow Goal Status (U0454(G8997) CI  Swallow Discharge Status (U9811(G8998) CI  SLP Evaluations  $ SLP Speech Visit 1 Procedure  SLP Evaluations  $BSS Swallow 1 Procedure   Breck CoonsLisa Willis OneontaLitaker M.Ed [employer]-SLP Pager [phone]  03/16/2014

## 2014-06-28 ENCOUNTER — Emergency Department (HOSPITAL_COMMUNITY)
Admission: EM | Admit: 2014-06-28 | Discharge: 2014-06-28 | Disposition: A | Discharge status: Home / Self Care | Payer: Medicare Other | Attending: Emergency Medicine | Admitting: Emergency Medicine

## 2014-06-28 ENCOUNTER — Emergency Department (HOSPITAL_COMMUNITY): Payer: Medicare Other

## 2014-06-28 ENCOUNTER — Encounter (HOSPITAL_COMMUNITY): Payer: Self-pay | Admitting: Emergency Medicine

## 2014-06-28 DIAGNOSIS — Z794 Long term (current) use of insulin: Secondary | ICD-10-CM | POA: Diagnosis not present

## 2014-06-28 DIAGNOSIS — E119 Type 2 diabetes mellitus without complications: Secondary | ICD-10-CM | POA: Diagnosis not present

## 2014-06-28 DIAGNOSIS — E785 Hyperlipidemia, unspecified: Secondary | ICD-10-CM | POA: Diagnosis not present

## 2014-06-28 DIAGNOSIS — I10 Essential (primary) hypertension: Secondary | ICD-10-CM | POA: Insufficient documentation

## 2014-06-28 DIAGNOSIS — R5381 Other malaise: Secondary | ICD-10-CM | POA: Diagnosis present

## 2014-06-28 DIAGNOSIS — Z7982 Long term (current) use of aspirin: Secondary | ICD-10-CM | POA: Diagnosis not present

## 2014-06-28 DIAGNOSIS — H269 Unspecified cataract: Secondary | ICD-10-CM | POA: Insufficient documentation

## 2014-06-28 DIAGNOSIS — I2510 Atherosclerotic heart disease of native coronary artery without angina pectoris: Secondary | ICD-10-CM | POA: Insufficient documentation

## 2014-06-28 DIAGNOSIS — F0390 Unspecified dementia without behavioral disturbance: Secondary | ICD-10-CM | POA: Insufficient documentation

## 2014-06-28 DIAGNOSIS — R531 Weakness: Secondary | ICD-10-CM

## 2014-06-28 DIAGNOSIS — Z951 Presence of aortocoronary bypass graft: Secondary | ICD-10-CM | POA: Diagnosis not present

## 2014-06-28 DIAGNOSIS — Z87891 Personal history of nicotine dependence: Secondary | ICD-10-CM | POA: Diagnosis not present

## 2014-06-28 DIAGNOSIS — D649 Anemia, unspecified: Secondary | ICD-10-CM | POA: Insufficient documentation

## 2014-06-28 DIAGNOSIS — Z79899 Other long term (current) drug therapy: Secondary | ICD-10-CM | POA: Diagnosis not present

## 2014-06-28 DIAGNOSIS — R5383 Other fatigue: Secondary | ICD-10-CM | POA: Diagnosis present

## 2014-06-28 LAB — COMPREHENSIVE METABOLIC PANEL
ALT: 14 U/L (ref 0–53)
ANION GAP: 14 (ref 5–15)
AST: 17 U/L (ref 0–37)
Albumin: 3.3 g/dL — ABNORMAL LOW (ref 3.5–5.2)
Alkaline Phosphatase: 63 U/L (ref 39–117)
BUN: 23 mg/dL (ref 6–23)
CALCIUM: 8.8 mg/dL (ref 8.4–10.5)
CO2: 22 mEq/L (ref 19–32)
CREATININE: 1.68 mg/dL — AB (ref 0.50–1.35)
Chloride: 95 mEq/L — ABNORMAL LOW (ref 96–112)
GFR calc non Af Amer: 37 mL/min — ABNORMAL LOW (ref >90)
GFR, EST AFRICAN AMERICAN: 43 mL/min — AB (ref >90)
Glucose, Bld: 181 mg/dL — ABNORMAL HIGH (ref 70–99)
Potassium: 5 mEq/L (ref 3.7–5.3)
Sodium: 131 mEq/L — ABNORMAL LOW (ref 137–147)
Total Protein: 6.5 g/dL (ref 6.0–8.3)

## 2014-06-28 LAB — CBC WITH DIFFERENTIAL/PLATELET
BASOS ABS: 0 10*3/uL (ref 0.0–0.1)
Basophils Relative: 0 % (ref 0–1)
EOS PCT: 1 % (ref 0–5)
Eosinophils Absolute: 0.1 10*3/uL (ref 0.0–0.7)
HCT: 30.7 % — ABNORMAL LOW (ref 39.0–52.0)
HEMOGLOBIN: 10.4 g/dL — AB (ref 13.0–17.0)
LYMPHS PCT: 44 % (ref 12–46)
Lymphs Abs: 2.2 10*3/uL (ref 0.7–4.0)
MCH: 32.7 pg (ref 26.0–34.0)
MCHC: 33.9 g/dL (ref 30.0–36.0)
MCV: 96.5 fL (ref 78.0–100.0)
MONOS PCT: 10 % (ref 3–12)
Monocytes Absolute: 0.5 10*3/uL (ref 0.1–1.0)
Neutro Abs: 2.2 10*3/uL (ref 1.7–7.7)
Neutrophils Relative %: 45 % (ref 43–77)
Platelets: 135 10*3/uL — ABNORMAL LOW (ref 150–400)
RBC: 3.18 MIL/uL — ABNORMAL LOW (ref 4.22–5.81)
RDW: 12.9 % (ref 11.5–15.5)
WBC: 5 10*3/uL (ref 4.0–10.5)

## 2014-06-28 LAB — URINALYSIS, ROUTINE W REFLEX MICROSCOPIC
Bilirubin Urine: NEGATIVE
GLUCOSE, UA: NEGATIVE mg/dL
HGB URINE DIPSTICK: NEGATIVE
KETONES UR: NEGATIVE mg/dL
Leukocytes, UA: NEGATIVE
Nitrite: NEGATIVE
PH: 7 (ref 5.0–8.0)
Protein, ur: NEGATIVE mg/dL
Specific Gravity, Urine: 1.006 (ref 1.005–1.030)
Urobilinogen, UA: 0.2 mg/dL (ref 0.0–1.0)

## 2014-06-28 LAB — POC OCCULT BLOOD, ED: FECAL OCCULT BLD: NEGATIVE

## 2014-06-28 LAB — TROPONIN I: Troponin I: <0.3 ng/mL (ref <0.30)

## 2014-06-28 MED ORDER — LABETALOL HCL 5 MG/ML IV SOLN
10.0000 mg | Freq: Once | INTRAVENOUS | Status: AC
Start: 2014-06-28 — End: 2014-06-28
  Administered 2014-06-28: 10 mg via INTRAVENOUS
  Filled 2014-06-28: qty 4

## 2014-06-28 NOTE — ED Notes (Signed)
Pt at radiology  ?

## 2014-06-28 NOTE — ED Notes (Signed)
MD at bedside. 

## 2014-06-28 NOTE — ED Notes (Signed)
Pt transported to XR, will obtain EKG when pt returns.

## 2014-06-28 NOTE — ED Notes (Signed)
Per EMS. Pt from [facility], hx of dementia. Nursing home staff reports that pt has had shuffling gait today instead of normal steady ambulation. Alert and oriented per norm.

## 2014-06-28 NOTE — Discharge Instructions (Signed)
Hemoglobin  Date Value Ref Range Status  06/28/2014 10.4* 13.0 - 17.0 g/dL Final  1/61/0960 45.4* 13.0 - 17.0 g/dL Final  0/98/1191 47.8* 13.0 - 17.0 g/dL Final  12/29/5619 30.8* 65.7 - 17.0 g/dL Final    The patient is anemic and will need a recheck.  This was microcytic, and he had a negative occult blood in his stool.  He also had no signs of urinary tract infection, as well as no signs of trauma on head CT or Chest XR   Weakness Weakness is a lack of strength. It may be felt all over the body (generalized) or in one specific part of the body (focal). Some causes of weakness can be serious. You may need further medical evaluation, especially if you are elderly or you have a history of immunosuppression (such as chemotherapy or HIV), kidney disease, heart disease, or diabetes. CAUSES  Weakness can be caused by many different things, including:  Infection.  Physical exhaustion.  Internal bleeding or other blood loss that results in a lack of red blood cells (anemia).  Dehydration. This cause is more common in elderly people.  Side effects or electrolyte abnormalities from medicines, such as pain medicines or sedatives.  Emotional distress, anxiety, or depression.  Circulation problems, especially severe peripheral arterial disease.  Heart disease, such as rapid atrial fibrillation, bradycardia, or heart failure.  Nervous system disorders, such as Guillain-Barr syndrome, multiple sclerosis, or stroke. DIAGNOSIS  To find the cause of your weakness, your caregiver will take your history and perform a physical exam. Lab tests or X-rays may also be ordered, if needed. TREATMENT  Treatment of weakness depends on the cause of your symptoms and can vary greatly. HOME CARE INSTRUCTIONS   Rest as needed.  Eat a well-balanced diet.  Try to get some exercise every day.  Only take over-the-counter or prescription medicines as directed by your caregiver. SEEK MEDICAL CARE IF:   Your  weakness seems to be getting worse or spreads to other parts of your body.  You develop new aches or pains. SEEK IMMEDIATE MEDICAL CARE IF:   You cannot perform your normal daily activities, such as getting dressed and feeding yourself.  You cannot walk up and down stairs, or you feel exhausted when you do so.  You have shortness of breath or chest pain.  You have difficulty moving parts of your body.  You have weakness in only one area of the body or on only one side of the body.  You have a fever.  You have trouble speaking or swallowing.  You cannot control your bladder or bowel movements.  You have black or bloody vomit or stools. MAKE SURE YOU:  Understand these instructions.  Will watch your condition.  Will get help right away if you are not doing well or get worse. Document Released: 11/05/2005 Document Revised: 05/06/2012 Document Reviewed: 01/04/2012 [facility] Patient Information 2015 [place], [place]. This information is not intended to replace advice given to you by your health care provider. Make sure you discuss any questions you have with your health care provider. Anemia, Nonspecific Anemia is a condition in which the concentration of red blood cells or hemoglobin in the blood is below normal. Hemoglobin is a substance in red blood cells that carries oxygen to the tissues of the body. Anemia results in not enough oxygen reaching these tissues.  CAUSES  Common causes of anemia include:   Excessive bleeding. Bleeding may be internal or external. This includes excessive bleeding from  periods (in women) or from the intestine.   Poor nutrition.   Chronic kidney, thyroid, and liver disease.  Bone marrow disorders that decrease red blood cell production.  Cancer and treatments for cancer.  HIV, AIDS, and their treatments.  Spleen problems that increase red blood cell destruction.  Blood disorders.  Excess destruction of red blood cells due to infection,  medicines, and autoimmune disorders. SIGNS AND SYMPTOMS   Minor weakness.   Dizziness.   Headache.  Palpitations.   Shortness of breath, especially with exercise.   Paleness.  Cold sensitivity.  Indigestion.  Nausea.  Difficulty sleeping.  Difficulty concentrating. Symptoms may occur suddenly or they may develop slowly.  DIAGNOSIS  Additional blood tests are often needed. These help your health care provider determine the best treatment. Your health care provider will check your stool for blood and look for other causes of blood loss.  TREATMENT  Treatment varies depending on the cause of the anemia. Treatment can include:   Supplements of iron, vitamin B12, or folic acid.   Hormone medicines.   A blood transfusion. This may be needed if blood loss is severe.   Hospitalization. This may be needed if there is significant continual blood loss.   Dietary changes.  Spleen removal. HOME CARE INSTRUCTIONS Keep all follow-up appointments. It often takes many weeks to correct anemia, and having your health care provider check on your condition and your response to treatment is very important. SEEK IMMEDIATE MEDICAL CARE IF:   You develop extreme weakness, shortness of breath, or chest pain.   You become dizzy or have trouble concentrating.  You develop heavy vaginal bleeding.   You develop a rash.   You have bloody or black, tarry stools.   You faint.   You vomit up blood.   You vomit repeatedly.   You have abdominal pain.  You have a fever or persistent symptoms for more than 2-3 days.   You have a fever and your symptoms suddenly get worse.   You are dehydrated.  MAKE SURE YOU:  Understand these instructions.  Will watch your condition.  Will get help right away if you are not doing well or get worse. Document Released: 12/13/2004 Document Revised: 07/08/2013 Document Reviewed: 05/01/2013 [facility] Patient Information 2015  [place]. This information is not intended to replace advice given to you by your health care provider. Make sure you discuss any questions you have with your health care provider.

## 2014-06-28 NOTE — ED Notes (Signed)
Bed: [id number] Expected date:  Expected time:  Means of arrival:  Comments: ems- elderly ems

## 2014-06-28 NOTE — ED Notes (Signed)
[facility] called for transport.  

## 2014-06-28 NOTE — ED Provider Notes (Signed)
CSN: [id number]     Arrival date & time 06/28/14  1814 History   First MD Initiated Contact with Patient 06/28/14 1817     Chief Complaint  Patient presents with  . Weakness     (Consider location/radiation/quality/duration/timing/severity/associated sxs/prior Treatment) Patient is a 80 y.o. male presenting with general illness.  Illness Location:  Generalized Quality:  Weakness Severity:  Moderate Onset quality:  Gradual Duration:  1 day Timing:  Constant Progression:  Worsening Chronicity:  New Context:  Dementia, lives in ALF Relieved by:  Nothing Worsened by:  Nothing Associated symptoms: no abdominal pain, no cough, no diarrhea, no fever, no nausea, no shortness of breath and no vomiting     Past Medical History  Diagnosis Date  . Diabetes mellitus   . Hyperlipidemia   . Hypertension   . CAD (coronary artery disease) of bypass graft     Multivessel  . Dementia     MMSE 18/30 Feb 2012 [facility])  . PVD (peripheral vascular disease)   . Cataract     bilateral   Past Surgical History  Procedure Laterality Date  . Coronary artery bypass graft     Family History  Problem Relation Age of Onset  . Diabetes Mother   . Dementia Mother     alzheimer  . Dementia Father   . Stroke Father   . Heart disease Father   . Dementia Sister    History  Substance Use Topics  . Smoking status: Former Smoker    Quit date: 11/19/1948  . Smokeless tobacco: Never Used  . Alcohol Use: No    Review of Systems  Unable to perform ROS: Dementia  Constitutional: Negative for fever.  Respiratory: Negative for cough and shortness of breath.   Gastrointestinal: Negative for nausea, vomiting, abdominal pain and diarrhea.      Allergies  Review of patient's allergies indicates no known allergies.  Home Medications   Prior to Admission medications   Medication Sig Start Date End Date Taking? Authorizing Provider  aspirin 81 MG tablet Take 81 mg by mouth daily.     Yes  Historical Provider, MD  atorvastatin (LIPITOR) 20 MG tablet Take 20 mg by mouth at bedtime.   Yes Historical Provider, MD  cholecalciferol (VITAMIN D) 1000 UNITS tablet Take 1,000 Units by mouth daily.     Yes Historical Provider, MD  divalproex (DEPAKOTE SPRINKLE) 125 MG capsule Take 250 mg by mouth 2 (two) times daily.   Yes Historical Provider, MD  donepezil (ARICEPT) 10 MG tablet Take 10 mg by mouth at bedtime.     Yes Historical Provider, MD  enalapril (VASOTEC) 10 MG tablet Take 10 mg by mouth daily.   Yes Historical Provider, MD  ferrous sulfate 325 (65 FE) MG tablet Take 325 mg by mouth daily with breakfast.   Yes Historical Provider, MD  furosemide (LASIX) 20 MG tablet Take 20 mg by mouth daily.     Yes Historical Provider, MD  insulin aspart (NOVOLOG) 100 UNIT/ML injection Inject 10 Units into the skin 3 (three) times daily before meals. Hold if bs is less than 75   Yes Historical Provider, MD  insulin aspart (NOVOLOG) 100 UNIT/ML injection Inject 4 Units into the skin See admin instructions. If bs is over 250   Yes Historical Provider, MD  insulin glargine (LANTUS) 100 UNIT/ML injection Inject 10 Units into the skin every morning. Hold if bs is less than 75   Yes Historical Provider, MD  isosorbide-hydrALAZINE (BIDIL) 20-37.5 MG  per tablet Take 1 tablet by mouth 3 (three) times daily. 12/09/13  Yes Windell Hummingbirdachel Chikowski, MD  LORazepam (ATIVAN) 0.5 MG tablet Take 1 tablet (0.5 mg total) by mouth 2 (two) times daily. 12/09/13  Yes Windell Hummingbirdachel Chikowski, MD  Memantine HCl ER (NAMENDA XR) 28 MG CP24 Take 1 capsule by mouth daily.   Yes Historical Provider, MD   BP 182/57  Pulse 68  Temp(Src) 98 F (36.7 C) (Oral)  Resp 14  SpO2 98% Physical Exam  Vitals reviewed. Constitutional: He appears well-developed and well-nourished.  HENT:  Head: Normocephalic and atraumatic.  Eyes: Conjunctivae and EOM are normal.  Neck: Normal range of motion. Neck supple.  Cardiovascular: Normal rate, regular  rhythm and normal heart sounds.   Pulmonary/Chest: Effort normal and breath sounds normal. No respiratory distress.  Abdominal: He exhibits no distension. There is no tenderness. There is no rebound and no guarding.  Musculoskeletal: Normal range of motion.  Neurological: He is alert. He has normal strength and normal reflexes. No cranial nerve deficit or sensory deficit.  Skin: Skin is warm and dry.    ED Course  Procedures (including critical care time) Labs Review Labs Reviewed  CBC WITH DIFFERENTIAL - Abnormal; Notable for the following:    RBC 3.18 (*)    Hemoglobin 10.4 (*)    HCT 30.7 (*)    Platelets 135 (*)    All other components within normal limits  COMPREHENSIVE METABOLIC PANEL - Abnormal; Notable for the following:    Sodium 131 (*)    Chloride 95 (*)    Glucose, Bld 181 (*)    Creatinine, Ser 1.68 (*)    Albumin 3.3 (*)    Total Bilirubin <0.2 (*)    GFR calc non Af Amer 37 (*)    GFR calc Af Amer 43 (*)    All other components within normal limits  URINE CULTURE  URINALYSIS, ROUTINE W REFLEX MICROSCOPIC  TROPONIN I  POC OCCULT BLOOD, ED    Imaging Review Dg Chest 2 View  06/28/2014   CLINICAL DATA:  Shortness of breath and weakness.  EXAM: CHEST  2 VIEW  COMPARISON:  12/04/2013  FINDINGS: Sternotomy wires are unchanged. Lungs are adequately inflated without consolidation or effusion. There is minimal prominence of the perihilar markings suggesting mild vascular congestion. There is mild stable cardiomegaly. Mild degenerative change of the spine.  IMPRESSION: Mild stable cardiomegaly with findings suggesting minimal vascular congestion.   Electronically Signed   By: Elberta Fortisaniel  Boyle M.D.   On: 06/28/2014 19:21   Ct Head Wo Contrast  06/28/2014   CLINICAL DATA:  Weakness.  Hypertension.  Dementia.  EXAM: CT HEAD WITHOUT CONTRAST  TECHNIQUE: Contiguous axial images were obtained from the base of the skull through the vertex without intravenous contrast.  COMPARISON:   12/04/2013  FINDINGS: The brainstem, cerebellum, cerebral peduncles, thalamus, basal ganglia, basilar cisterns, and ventricular system appear within normal limits. Periventricular white matter and corona radiata hypodensities favor chronic ischemic microvascular white matter disease. No intracranial hemorrhage, mass lesion, or acute CVA. Polypoid mucoperiosteal thickening noted in both maxillary sinuses. There is atherosclerotic calcification of the cavernous carotid arteries bilaterally.  IMPRESSION: 1. No acute intracranial findings. 2. Periventricular white matter and corona radiata hypodensities favor chronic ischemic microvascular white matter disease. 3. Small mucous retention cysts in both maxillary sinuses.   Electronically Signed   By: Herbie BaltimoreWalt  Liebkemann M.D.   On: 06/28/2014 19:51     EKG Interpretation   Date/Time:  Monday June 28 2014 19:49:03 EDT Ventricular Rate:  73 PR Interval:  257 QRS Duration: 113 QT Interval:  404 QTC Calculation: 445 R Axis:   72 Text Interpretation:  Sinus rhythm Prolonged PR interval Probable left  atrial enlargement Incomplete right bundle branch block Nonspecific T  abnormalities, lateral leads Borderline ST elevation, anterior leads No  significant change was found Confirmed by Mirian Mo [id number]) on  06/28/2014 7:53:08 PM      MDM   Final diagnoses:  Generalized weakness  Anemia, unspecified anemia type    80 y.o. male  with pertinent PMH of dementia, DM, CAD, PVD, HLD presents from his assisted-living facility with concern that his gait is not shuffling he is unable tingling on exam, which is different from his norm. Patient has a long-standing history of dementia and per EMS report on arrival is at his neurologic baseline.  No reported history of fall. I spoke with nursing at his facility and they did not endorse any recent fevers, cough, vomiting, diarrhea or other infectious symptoms. .  Additionally They reaffirmed that the patient had  not fallen.  Physical exam and vitals as above without focal neurologic deficit.    Labs and imaging as above reviewed. Hemoglobin decreased from prior, however at this time the patient has Hemoccult negative stool in the anemia is microcytic suggesting chronic iron deficiency process as the most likely etiology.  Patient persistently asymptomatic throughout his care here. His blood pressure improved after labetalol.  Labs and imaging as above unchanged from patient baseline otherwise.  Discharged home with lab work comparison and instructions to repeat hemoglobin in 2 days.    1. Generalized weakness   2. Anemia, unspecified anemia type         Mirian Mo, MD 06/28/14 2337

## 2014-06-29 LAB — URINE CULTURE

## 2015-05-20 ENCOUNTER — Emergency Department (HOSPITAL_COMMUNITY)
Admission: EM | Admit: 2015-05-20 | Discharge: 2015-05-20 | Disposition: A | Discharge status: Home / Self Care | Payer: Medicare Other | Attending: Emergency Medicine | Admitting: Emergency Medicine

## 2015-05-20 ENCOUNTER — Encounter (HOSPITAL_COMMUNITY): Payer: Self-pay | Admitting: *Deleted

## 2015-05-20 DIAGNOSIS — Z7982 Long term (current) use of aspirin: Secondary | ICD-10-CM | POA: Diagnosis not present

## 2015-05-20 DIAGNOSIS — I10 Essential (primary) hypertension: Secondary | ICD-10-CM | POA: Insufficient documentation

## 2015-05-20 DIAGNOSIS — H269 Unspecified cataract: Secondary | ICD-10-CM | POA: Diagnosis not present

## 2015-05-20 DIAGNOSIS — Z794 Long term (current) use of insulin: Secondary | ICD-10-CM | POA: Diagnosis not present

## 2015-05-20 DIAGNOSIS — Z79899 Other long term (current) drug therapy: Secondary | ICD-10-CM | POA: Diagnosis not present

## 2015-05-20 DIAGNOSIS — E785 Hyperlipidemia, unspecified: Secondary | ICD-10-CM | POA: Diagnosis not present

## 2015-05-20 DIAGNOSIS — E119 Type 2 diabetes mellitus without complications: Secondary | ICD-10-CM | POA: Diagnosis not present

## 2015-05-20 DIAGNOSIS — I2510 Atherosclerotic heart disease of native coronary artery without angina pectoris: Secondary | ICD-10-CM | POA: Diagnosis not present

## 2015-05-20 DIAGNOSIS — Z951 Presence of aortocoronary bypass graft: Secondary | ICD-10-CM | POA: Insufficient documentation

## 2015-05-20 DIAGNOSIS — Z87891 Personal history of nicotine dependence: Secondary | ICD-10-CM | POA: Insufficient documentation

## 2015-05-20 DIAGNOSIS — F0391 Unspecified dementia with behavioral disturbance: Secondary | ICD-10-CM | POA: Insufficient documentation

## 2015-05-20 DIAGNOSIS — F919 Conduct disorder, unspecified: Secondary | ICD-10-CM | POA: Diagnosis present

## 2015-05-20 NOTE — ED Notes (Signed)
[facility] a [facility]. EMS was called and it was reported that the patient was combative. Upon arrival the patient was sitting in the dining room amongst other residents eating breakfast. Patient had been given ativan by the nursing staff prior to EMS being called. He calmly followed all commands for EMS. The assigned nurses stated that he needed to go out to have his medications changed. It was explained by EMS that the resident's primary doctor would need to make those changes and that the resident was calm and cooperative. It was insisted that the patient be taken out. The patient is cooperative and states, " I don't know why I am here. I was in my room and they put me on the stretcher and wheeled me away". The patient no physical complaints. He wants to go home.

## 2015-05-20 NOTE — ED Notes (Signed)
Bed: WA17 Expected date:  Expected time:  Means of arrival:  Comments: EMS AMS 

## 2015-05-20 NOTE — Discharge Instructions (Signed)
Patient sent in from a nursing facility for disruptive behavior. Patient was cooperative for EMS and has been totally cooperative here. Vital signs are normal. Patient is stable for discharge back to nursing facility. Nursing facility could have the patient's primary physician take a look at his medications to see if there needs to be any consideration for changes in those meds. From an emergency department standpoint no change in medication required.

## 2015-05-20 NOTE — ED Provider Notes (Signed)
CSN: [id number]     Arrival date & time 05/20/15  0827 History   First MD Initiated Contact with Patient 05/20/15 0831     No chief complaint on file.    (Consider location/radiation/quality/duration/timing/severity/associated sxs/prior Treatment) The history is provided by the patient, the EMS personnel and the nursing home.   81 year old male brought in from [facility] nursing facility by EMS. EMS was called for the patient having disruptive behavior. When they arrived patient was calm and cooperative and remained so. Nursing facility given him some Ativan which she does have prescribed. Patient here remains cooperative no complaints. No abnormal vital signs.  Past Medical History  Diagnosis Date  . Diabetes mellitus   . Hyperlipidemia   . Hypertension   . CAD (coronary artery disease) of bypass graft     Multivessel  . Dementia     MMSE 18/30 Feb 2012 [facility])  . PVD (peripheral vascular disease)   . Cataract     bilateral   Past Surgical History  Procedure Laterality Date  . Coronary artery bypass graft     Family History  Problem Relation Age of Onset  . Diabetes Mother   . Dementia Mother     alzheimer  . Dementia Father   . Stroke Father   . Heart disease Father   . Dementia Sister    History  Substance Use Topics  . Smoking status: Former Smoker    Quit date: 11/19/1948  . Smokeless tobacco: Never Used  . Alcohol Use: No    Review of Systems  Unable to perform ROS  level V caveat applies due to the patient's dementia he is alert but not completely oriented.    Allergies  Review of patient's allergies indicates no known allergies.  Home Medications   Prior to Admission medications   Medication Sig Start Date End Date Taking? Authorizing Provider  aspirin 81 MG tablet Take 81 mg by mouth daily.     Yes Historical Provider, MD  atorvastatin (LIPITOR) 20 MG tablet Take 20 mg by mouth at bedtime.   Yes Historical Provider, MD  cholecalciferol  (VITAMIN D) 1000 UNITS tablet Take 1,000 Units by mouth daily.     Yes Historical Provider, MD  dextromethorphan-guaiFENesin (ROBITUSSIN-DM) 10-100 MG/5ML liquid Take 10 mLs by mouth every 6 (six) hours as needed for cough. Sugar free   Yes Historical Provider, MD  divalproex (DEPAKOTE SPRINKLE) 125 MG capsule Take 250 mg by mouth 3 (three) times daily.    Yes Historical Provider, MD  donepezil (ARICEPT) 10 MG tablet Take 10 mg by mouth at bedtime.     Yes Historical Provider, MD  enalapril (VASOTEC) 10 MG tablet Take 10 mg by mouth at bedtime.    Yes Historical Provider, MD  ferrous sulfate 325 (65 FE) MG tablet Take 325 mg by mouth daily with breakfast.   Yes Historical Provider, MD  hydrALAZINE (APRESOLINE) 10 MG tablet Take 10 mg by mouth 3 (three) times daily.   Yes Historical Provider, MD  insulin aspart (NOVOLOG) 100 UNIT/ML injection Inject 14 Units into the skin 3 (three) times daily before meals. Hold if bs is less than 75   Yes Historical Provider, MD  insulin glargine (LANTUS) 100 UNIT/ML injection Inject 20 Units into the skin every morning. Hold if bs is less than 75   Yes Historical Provider, MD  LORazepam (ATIVAN) 0.5 MG tablet Take 1 tablet (0.5 mg total) by mouth 2 (two) times daily. 12/09/13  Yes Coolidge Breeze,  MD  memantine (NAMENDA XR) 14 MG CP24 24 hr capsule Take 14 mg by mouth daily.   Yes Historical Provider, MD  sodium chloride 1 G tablet Take 1 g by mouth daily.   Yes Historical Provider, MD  isosorbide-hydrALAZINE (BIDIL) 20-37.5 MG per tablet Take 1 tablet by mouth 3 (three) times daily. Patient not taking: Reported on 05/20/2015 12/09/13   Coolidge Breezeachel E Chikowski, MD   BP 166/54 mmHg  Temp(Src) 98.5 F (36.9 C) (Oral)  Resp 20  SpO2 97% Physical Exam  Constitutional: He appears well-developed and well-nourished.  HENT:  Head: Normocephalic and atraumatic.  Mouth/Throat: Oropharynx is clear and moist.  Eyes: Conjunctivae and EOM are normal. Pupils are equal, round,  and reactive to light.  Neck: Normal range of motion.  Cardiovascular: Normal rate, regular rhythm and normal heart sounds.   No murmur heard. Pulmonary/Chest: Effort normal and breath sounds normal. No respiratory distress.  Abdominal: Soft. Bowel sounds are normal. There is no tenderness.  Musculoskeletal: Normal range of motion.  Neurological: He is alert. No cranial nerve deficit. He exhibits normal muscle tone. Coordination normal.  Skin: Skin is warm. No rash noted.  Nursing note and vitals reviewed.   ED Course  Procedures (including critical care time) Labs Review Labs Reviewed - No data to display  Imaging Review No results found.   EKG Interpretation None      MDM   Final diagnoses:  Dementia, with behavioral disturbance    [facility] patient has a history of dementia. EMS was called for disruptive behavior on the part of the patient. When they arrived patient was completely cooperative and remained cooperative. Nursing home wanted patient evaluated for possible medication changes. Patient has been cooperative here. Patient apparently was given some Ativan prior to arrival of EMS. Patient is nontoxic no acute distress does have dementia. Primary care physician for the nursing home can decide whether he wants to alter patient's medications from an emergency department standpoint no medication change required patient is safe for return back to nursing facility. Patient without any abnormal vital signs. Other than some elevation in the blood pressure. No fever no hypoxia.    Vanetta MuldersScott Tamiya Colello, MD 05/20/15 [phone]

## 2015-08-05 IMAGING — CT CT HEAD W/O CM
1 series · 15 of 30 positions shown, 19 images · non-contrast
Comparison: Noncontrast CT scan of brain dated November 20, 2013.

CLINICAL DATA: Altered mental status

EXAM:
CT HEAD WITHOUT CONTRAST
TECHNIQUE: Contiguous axial images were obtained from the base of the skull
through the vertex without intravenous contrast.

[Series 2: head 5.0 h30s · axial · 0.46mm/px · z∈[-225,-80]mm · 15 of 33 slices shown, 19 images]
[im 2/33  brain]
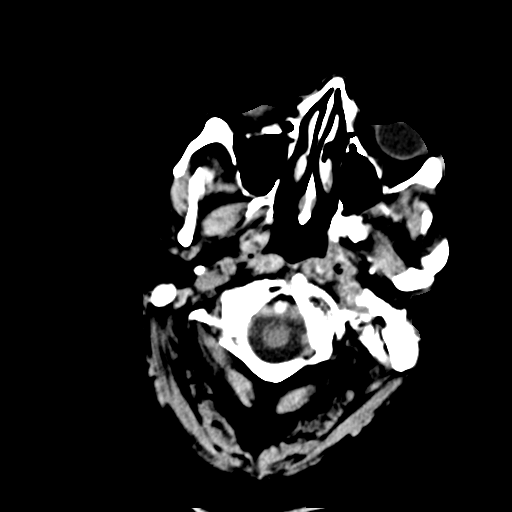
[im 2/33  bone]
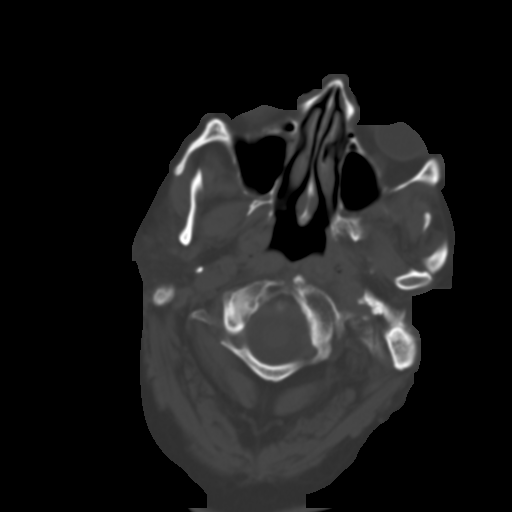
[im 4/33  brain]
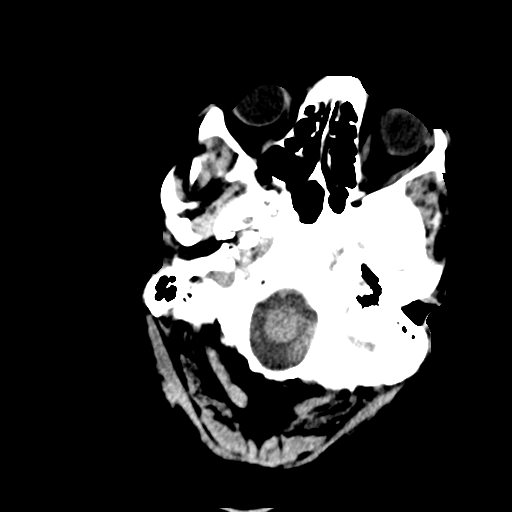
[im 6/33  brain]
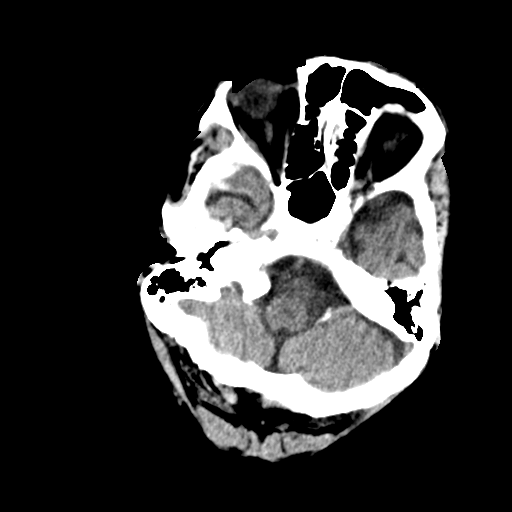
[im 8/33  brain]
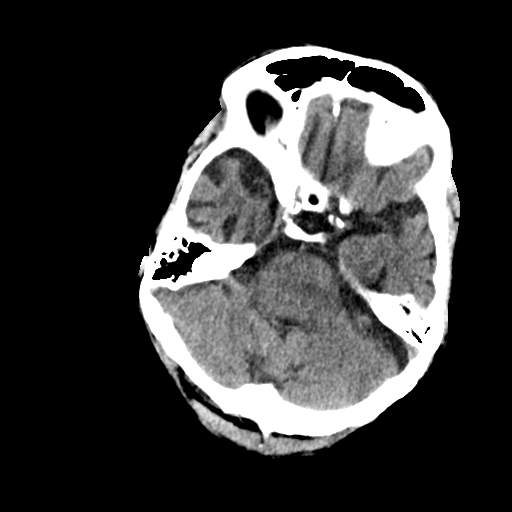
[im 10/33  brain]
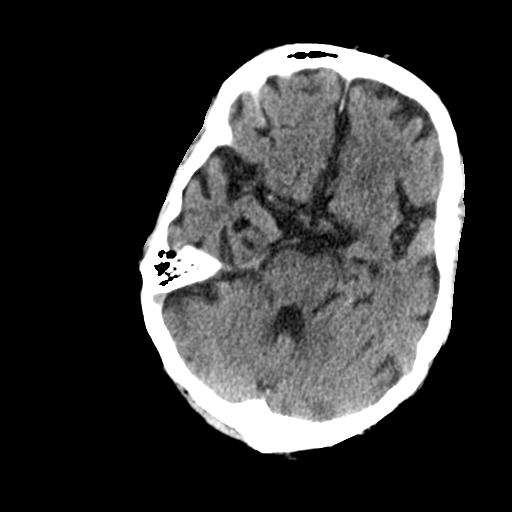
[im 10/33  bone]
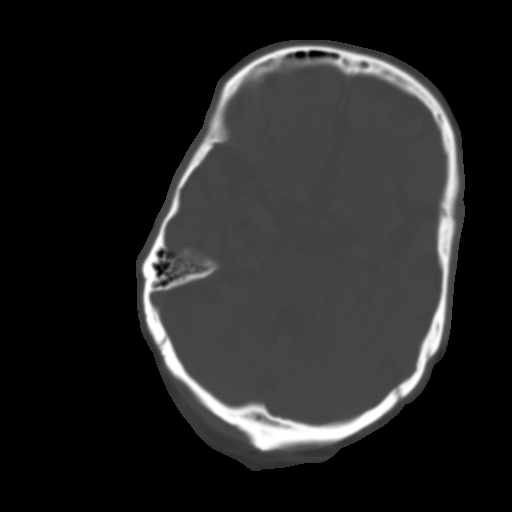
[im 13/33  brain]
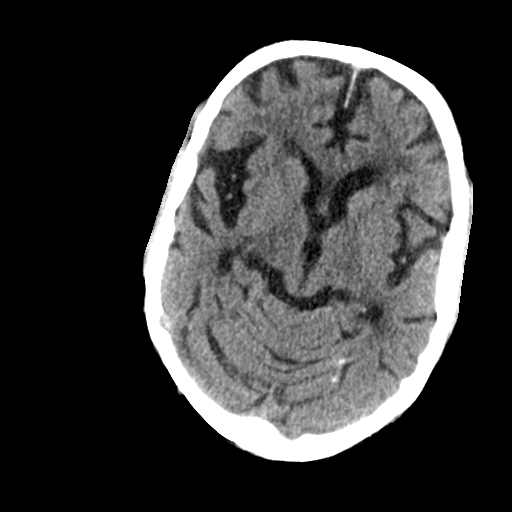
[im 15/33  brain]
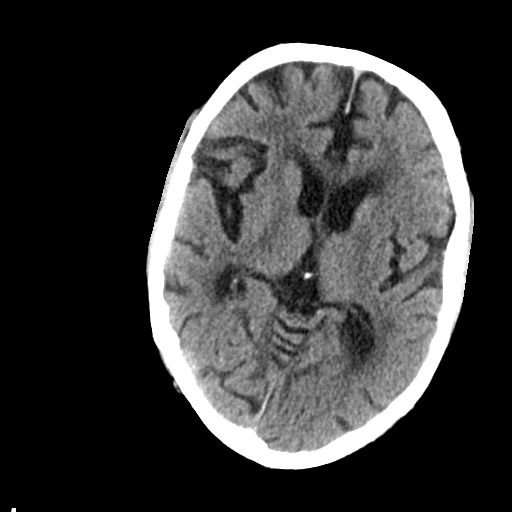
[im 17/33  brain]
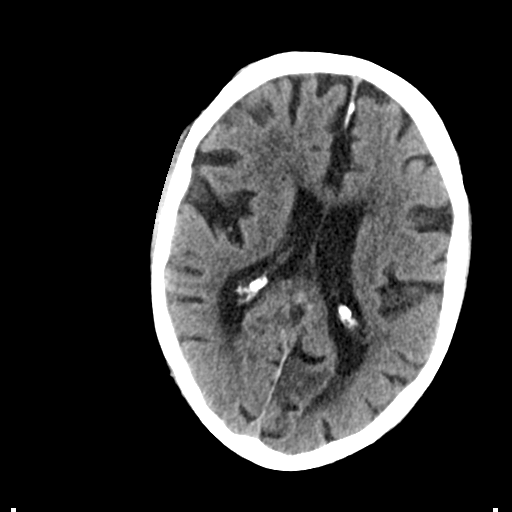
[im 18/33  brain]
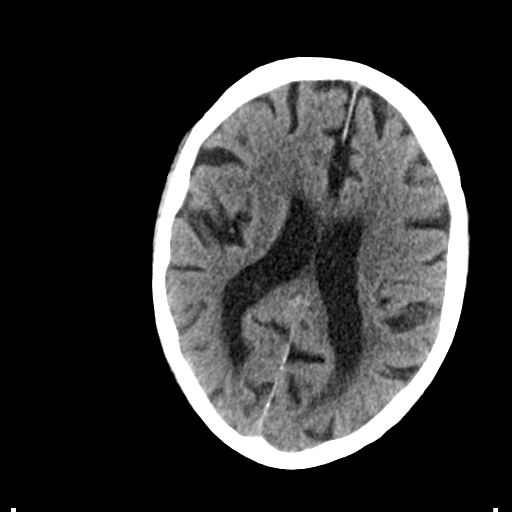
[im 18/33  bone]
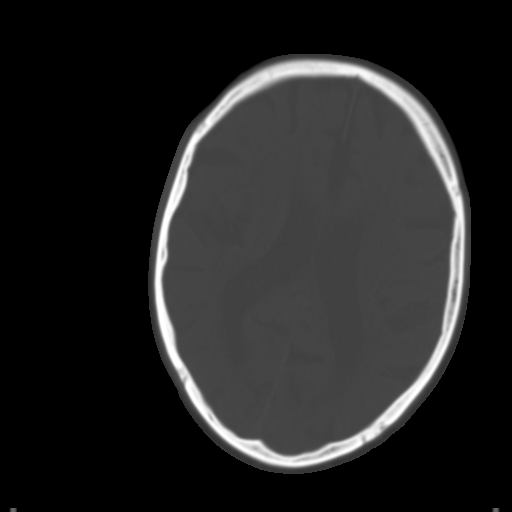
[im 20/33  brain]
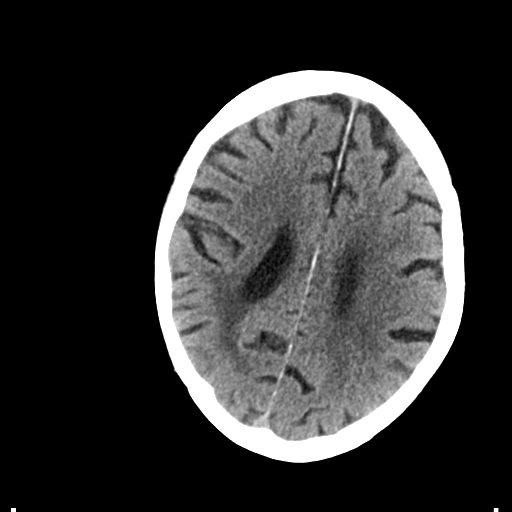
[im 23/33  brain]
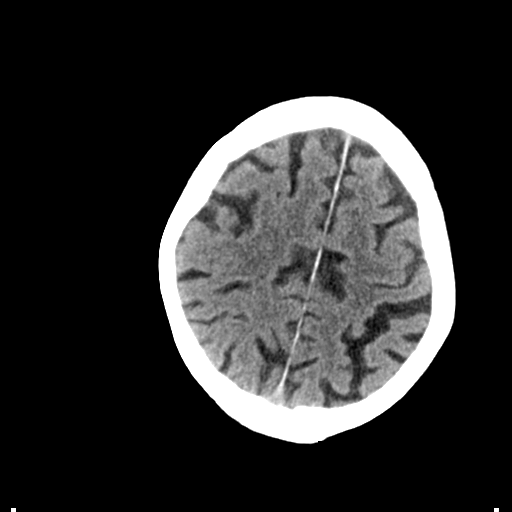
[im 25/33  brain]
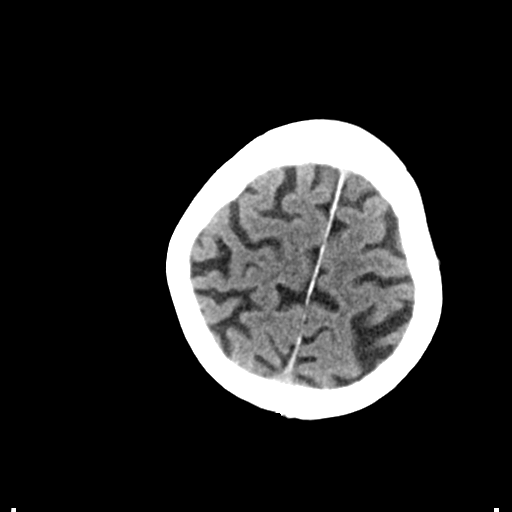
[im 27/33  brain]
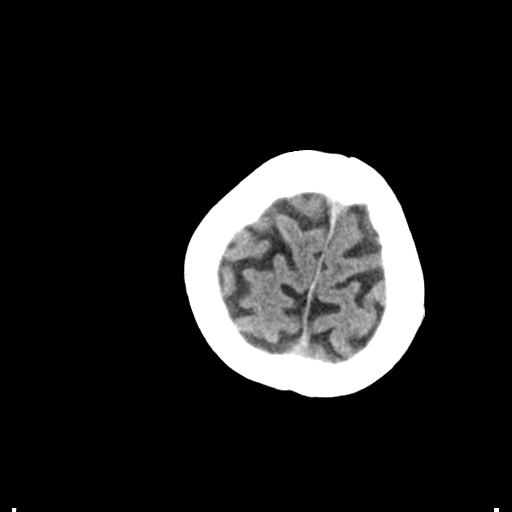
[im 27/33  bone]
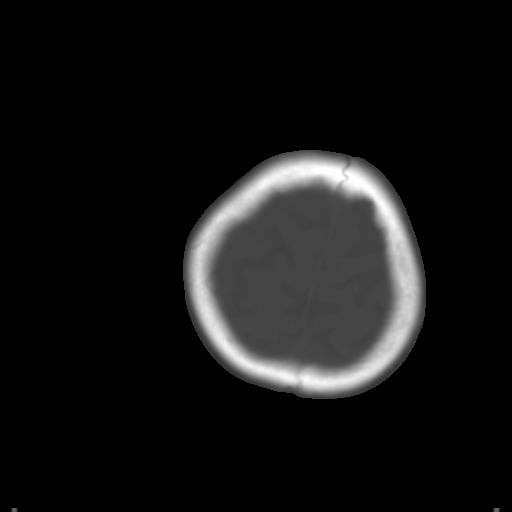
[im 29/33  brain]
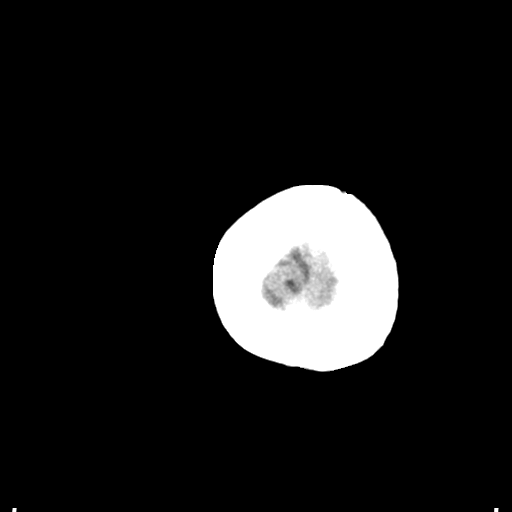
[im 31/33  brain]
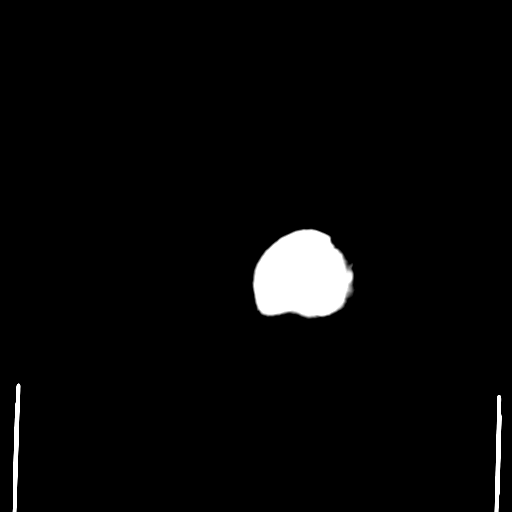

[15 of 30 positions shown; findings below may reference images not displayed]

FINDINGS: There is stable moderate diffuse cerebral and cerebellar atrophy
with compensatory ventriculomegaly. There is no evidence of an acute
intracranial hemorrhage nor of an evolving ischemic infarction.
Stable decreased density in the deep white matter of both cerebral
hemispheres as consistent with chronic small vessel ischemic type
change. The cerebellum and brainstem are normal in density.

At bone window settings the observed portions of the paranasal
sinuses and mastoid air cells exhibit no air-fluid levels. There is
minimal mucoperiosteal thickening within ethmoid sinus cells. There
is no evidence of an acute skull fracture.
IMPRESSION: 1. There is no evidence of an acute intracranial hemorrhage nor of
an evolving ischemic infarction.
2. There is no intracranial mass effect or hydrocephalus.
3. There is stable moderate diffuse cerebral and cerebellar atrophy
and white matter density hypodensity consistent with chronic small
vessel ischemic type change.

## 2015-08-18 ENCOUNTER — Emergency Department (HOSPITAL_COMMUNITY)
Admission: EM | Admit: 2015-08-18 | Discharge: 2015-08-18 | Disposition: A | Discharge status: Home / Self Care | Payer: Medicare Other | Attending: Emergency Medicine | Admitting: Emergency Medicine

## 2015-08-18 ENCOUNTER — Encounter (HOSPITAL_COMMUNITY): Payer: Self-pay | Admitting: *Deleted

## 2015-08-18 DIAGNOSIS — Z043 Encounter for examination and observation following other accident: Secondary | ICD-10-CM | POA: Insufficient documentation

## 2015-08-18 DIAGNOSIS — Y998 Other external cause status: Secondary | ICD-10-CM | POA: Insufficient documentation

## 2015-08-18 DIAGNOSIS — I10 Essential (primary) hypertension: Secondary | ICD-10-CM | POA: Diagnosis not present

## 2015-08-18 DIAGNOSIS — Y9389 Activity, other specified: Secondary | ICD-10-CM | POA: Diagnosis not present

## 2015-08-18 DIAGNOSIS — I2510 Atherosclerotic heart disease of native coronary artery without angina pectoris: Secondary | ICD-10-CM | POA: Diagnosis not present

## 2015-08-18 DIAGNOSIS — E119 Type 2 diabetes mellitus without complications: Secondary | ICD-10-CM | POA: Insufficient documentation

## 2015-08-18 DIAGNOSIS — F0390 Unspecified dementia without behavioral disturbance: Secondary | ICD-10-CM | POA: Insufficient documentation

## 2015-08-18 DIAGNOSIS — Z87891 Personal history of nicotine dependence: Secondary | ICD-10-CM | POA: Diagnosis not present

## 2015-08-18 DIAGNOSIS — W1839XA Other fall on same level, initial encounter: Secondary | ICD-10-CM | POA: Insufficient documentation

## 2015-08-18 DIAGNOSIS — Z7902 Long term (current) use of antithrombotics/antiplatelets: Secondary | ICD-10-CM | POA: Insufficient documentation

## 2015-08-18 DIAGNOSIS — Z951 Presence of aortocoronary bypass graft: Secondary | ICD-10-CM | POA: Diagnosis not present

## 2015-08-18 DIAGNOSIS — Y9289 Other specified places as the place of occurrence of the external cause: Secondary | ICD-10-CM | POA: Insufficient documentation

## 2015-08-18 DIAGNOSIS — Z7982 Long term (current) use of aspirin: Secondary | ICD-10-CM | POA: Insufficient documentation

## 2015-08-18 DIAGNOSIS — W19XXXA Unspecified fall, initial encounter: Secondary | ICD-10-CM

## 2015-08-18 DIAGNOSIS — Z794 Long term (current) use of insulin: Secondary | ICD-10-CM | POA: Insufficient documentation

## 2015-08-18 NOTE — ED Notes (Signed)
Bed: BJ47 Expected date:  Expected time:  Means of arrival:  Comments: EMS-FALL

## 2015-08-18 NOTE — Progress Notes (Signed)
CSW met with patient at bedside. Patient has a history of dementia. Patient was not able to provide information. Sister was present.   Sister confirms that the patient presents to [facility] due to fall. Also, she confirms that the patient is from [facility] and lives in the memory care unit. She states that the patient has been a resident at the facility since February, 2012.  Sister states that this is the first falling incident for patient within 6 months. Sisters states that the patient receives help with ADL's while at facility.  Sister says that she is the patient's primary support and that she also lives in [place].  Sister/[name] [phone]  Willette Brace [phone] ED CSW 08/18/2015 9:34 PM

## 2015-08-18 NOTE — ED Notes (Signed)
From [facility] for witnessed fall, was in process of sitting and slid past chair to floor. Spine cleared on scene. No neuro deficits. No LOC. No complaints at this time. No blood thinners.

## 2015-08-18 NOTE — Discharge Instructions (Signed)

## 2015-08-18 NOTE — ED Provider Notes (Signed)
CSN: [id number]     Arrival date & time 08/18/15  1515 History   First MD Initiated Contact with Patient 08/18/15 1539     Chief Complaint  Patient presents with  . Fall     (Consider location/radiation/quality/duration/timing/severity/associated sxs/prior Treatment) HPI   81 year old male sent for evaluation after fall. Patient is pleasantly demented. He cannot provide much useful history. Apparently patient was trying to sit down when he missed the bed and fell down onto his butt. No report that he struck his head. Patient denies any pain to me. He is in a memory care unit. Apparently he has had his baseline. No blood thinners per med list.  Past Medical History  Diagnosis Date  . Diabetes mellitus   . Hyperlipidemia   . Hypertension   . CAD (coronary artery disease) of bypass graft     Multivessel  . Dementia     MMSE 18/30 Feb 2012 [facility])  . PVD (peripheral vascular disease)   . Cataract     bilateral   Past Surgical History  Procedure Laterality Date  . Coronary artery bypass graft     Family History  Problem Relation Age of Onset  . Diabetes Mother   . Dementia Mother     alzheimer  . Dementia Father   . Stroke Father   . Heart disease Father   . Dementia Sister    Social History  Substance Use Topics  . Smoking status: Former Smoker    Quit date: 11/19/1948  . Smokeless tobacco: Never Used  . Alcohol Use: No    Review of Systems  Level V caveat because of advanced dementia.  Allergies  Review of patient's allergies indicates no known allergies.  Home Medications   Prior to Admission medications   Medication Sig Start Date End Date Taking? Authorizing Lamees Gable  aspirin 81 MG tablet Take 81 mg by mouth daily.     Yes Historical Sai Moura, MD  atorvastatin (LIPITOR) 20 MG tablet Take 20 mg by mouth at bedtime.   Yes Historical Cambria Osten, MD  cholecalciferol (VITAMIN D) 1000 UNITS tablet Take 2,000 Units by mouth daily.    Yes Historical Breeze Angell, MD   divalproex (DEPAKOTE SPRINKLE) 125 MG capsule Take 250 mg by mouth 3 (three) times daily.    Yes Historical Jazaria Jarecki, MD  donepezil (ARICEPT) 10 MG tablet Take 10 mg by mouth at bedtime.     Yes Historical Tiaja Hagan, MD  enalapril (VASOTEC) 10 MG tablet Take 10 mg by mouth daily with breakfast.    Yes Historical Dannis Deroche, MD  ferrous sulfate 325 (65 FE) MG tablet Take 325 mg by mouth daily with breakfast.   Yes Historical Alvine Mostafa, MD  guaiFENesin (SILTUSSIN DAS) 100 MG/5ML liquid Take 200 mg by mouth every 6 (six) hours as needed for cough.   Yes Historical Deauna Yaw, MD  hydrALAZINE (APRESOLINE) 10 MG tablet Take 10 mg by mouth 3 (three) times daily.   Yes Historical Daune Colgate, MD  insulin aspart (NOVOLOG) 100 UNIT/ML injection Inject 14 Units into the skin 3 (three) times daily before meals. Hold if bs is less than 75   Yes Historical Marley Pakula, MD  insulin glargine (LANTUS) 100 UNIT/ML injection Inject 20 Units into the skin every morning. Hold if bs is less than 75   Yes Historical Keegan Bensch, MD  LORazepam (ATIVAN) 0.5 MG tablet Take 1 tablet (0.5 mg total) by mouth 2 (two) times daily. 12/09/13  Yes Coolidge Breeze, MD  LORazepam (ATIVAN) 0.5 MG tablet Take 0.5  mg by mouth 2 (two) times daily as needed for anxiety.   Yes Historical Ricky Gallery, MD  memantine (NAMENDA XR) 14 MG CP24 24 hr capsule Take 14 mg by mouth daily.   Yes Historical Verna Desrocher, MD  sodium chloride 1 G tablet Take 1 g by mouth daily.   Yes Historical Russell Quinney, MD  traZODone (DESYREL) 50 MG tablet Take 25 mg by mouth every 8 (eight) hours as needed (for agitation).   Yes Historical Mckinnon Glick, MD  isosorbide-hydrALAZINE (BIDIL) 20-37.5 MG per tablet Take 1 tablet by mouth 3 (three) times daily. Patient not taking: Reported on 05/20/2015 12/09/13   Coolidge Breeze, MD   BP 181/55 mmHg  Pulse 74  Temp(Src) 98.4 F (36.9 C) (Oral)  Resp 20  SpO2 97% Physical Exam  Constitutional: He appears well-developed and well-nourished. No  distress.  Sitting up in bed awake. No acute distress. Smiling and talkative.  HENT:  Head: Normocephalic and atraumatic.  Seborrheic dermatitis.  Eyes: Conjunctivae are normal. Pupils are equal, round, and reactive to light. Right eye exhibits no discharge. Left eye exhibits no discharge.  Neck: Neck supple.  Cardiovascular: Normal rate, regular rhythm and normal heart sounds.  Exam reveals no gallop and no friction rub.   No murmur heard. Pulmonary/Chest: Effort normal and breath sounds normal. No respiratory distress.  Abdominal: Soft. He exhibits no distension. There is no tenderness.  Musculoskeletal: He exhibits no edema or tenderness.  No midline spinal tenderness. No apparent bony tenderness the extremities are apparent pain range of motion of the large joints. No external signs of acute trauma.  Neurological: He is alert. No cranial nerve deficit. He exhibits normal muscle tone. Coordination normal.  Skin: Skin is warm and dry.  Psychiatric: He has a normal mood and affect. His behavior is normal. Thought content normal.  Nursing note and vitals reviewed.   ED Course  Procedures (including critical care time) Labs Review Labs Reviewed - No data to display  Imaging Review No results found. I have personally reviewed and evaluated these images and lab results as part of my medical decision-making.   EKG Interpretation None      MDM   Final diagnoses:  Fall, initial encounter    83yM presenting for evaluation after fall. Pleasantly demented. Doesn't strike me as completely reliable, but denies any complaints. No appreciable tenderness or pain with ROM of large joints. No external signs of trauma. Mechanism not particularly concerning. No blood thinners. Apparently at baseline. I do not feel further w/u is needed at this time.     Raeford Razor, MD 08/18/15 [phone]

## 2015-11-02 ENCOUNTER — Inpatient Hospital Stay (HOSPITAL_COMMUNITY)
Admission: EM | Admit: 2015-11-02 | Discharge: 2015-11-07 | DRG: 872 | Disposition: A | Discharge status: Skilled Nursing Facility | Payer: Medicare Other | Attending: Internal Medicine | Admitting: Internal Medicine

## 2015-11-02 ENCOUNTER — Encounter (HOSPITAL_COMMUNITY): Payer: Self-pay | Admitting: Emergency Medicine

## 2015-11-02 ENCOUNTER — Emergency Department (HOSPITAL_COMMUNITY): Payer: Medicare Other

## 2015-11-02 DIAGNOSIS — I350 Nonrheumatic aortic (valve) stenosis: Secondary | ICD-10-CM | POA: Diagnosis present

## 2015-11-02 DIAGNOSIS — E1151 Type 2 diabetes mellitus with diabetic peripheral angiopathy without gangrene: Secondary | ICD-10-CM | POA: Diagnosis present

## 2015-11-02 DIAGNOSIS — G309 Alzheimer's disease, unspecified: Secondary | ICD-10-CM | POA: Diagnosis present

## 2015-11-02 DIAGNOSIS — A419 Sepsis, unspecified organism: Secondary | ICD-10-CM | POA: Diagnosis not present

## 2015-11-02 DIAGNOSIS — I248 Other forms of acute ischemic heart disease: Secondary | ICD-10-CM | POA: Diagnosis present

## 2015-11-02 DIAGNOSIS — D631 Anemia in chronic kidney disease: Secondary | ICD-10-CM | POA: Diagnosis present

## 2015-11-02 DIAGNOSIS — R001 Bradycardia, unspecified: Secondary | ICD-10-CM | POA: Diagnosis present

## 2015-11-02 DIAGNOSIS — I10 Essential (primary) hypertension: Secondary | ICD-10-CM | POA: Diagnosis present

## 2015-11-02 DIAGNOSIS — R509 Fever, unspecified: Secondary | ICD-10-CM

## 2015-11-02 DIAGNOSIS — Z79899 Other long term (current) drug therapy: Secondary | ICD-10-CM

## 2015-11-02 DIAGNOSIS — D6959 Other secondary thrombocytopenia: Secondary | ICD-10-CM | POA: Diagnosis not present

## 2015-11-02 DIAGNOSIS — R197 Diarrhea, unspecified: Secondary | ICD-10-CM | POA: Diagnosis not present

## 2015-11-02 DIAGNOSIS — I2510 Atherosclerotic heart disease of native coronary artery without angina pectoris: Secondary | ICD-10-CM | POA: Diagnosis present

## 2015-11-02 DIAGNOSIS — I441 Atrioventricular block, second degree: Secondary | ICD-10-CM | POA: Diagnosis present

## 2015-11-02 DIAGNOSIS — Z7982 Long term (current) use of aspirin: Secondary | ICD-10-CM

## 2015-11-02 DIAGNOSIS — N179 Acute kidney failure, unspecified: Secondary | ICD-10-CM | POA: Diagnosis present

## 2015-11-02 DIAGNOSIS — E118 Type 2 diabetes mellitus with unspecified complications: Secondary | ICD-10-CM | POA: Diagnosis present

## 2015-11-02 DIAGNOSIS — F0390 Unspecified dementia without behavioral disturbance: Secondary | ICD-10-CM | POA: Diagnosis present

## 2015-11-02 DIAGNOSIS — E1121 Type 2 diabetes mellitus with diabetic nephropathy: Secondary | ICD-10-CM | POA: Insufficient documentation

## 2015-11-02 DIAGNOSIS — N183 Chronic kidney disease, stage 3 (moderate): Secondary | ICD-10-CM | POA: Diagnosis present

## 2015-11-02 DIAGNOSIS — J9801 Acute bronchospasm: Secondary | ICD-10-CM

## 2015-11-02 DIAGNOSIS — F0280 Dementia in other diseases classified elsewhere without behavioral disturbance: Secondary | ICD-10-CM | POA: Diagnosis present

## 2015-11-02 DIAGNOSIS — E11649 Type 2 diabetes mellitus with hypoglycemia without coma: Secondary | ICD-10-CM | POA: Diagnosis present

## 2015-11-02 DIAGNOSIS — Z823 Family history of stroke: Secondary | ICD-10-CM

## 2015-11-02 DIAGNOSIS — Z794 Long term (current) use of insulin: Secondary | ICD-10-CM

## 2015-11-02 DIAGNOSIS — E1122 Type 2 diabetes mellitus with diabetic chronic kidney disease: Secondary | ICD-10-CM | POA: Diagnosis present

## 2015-11-02 DIAGNOSIS — Z8249 Family history of ischemic heart disease and other diseases of the circulatory system: Secondary | ICD-10-CM

## 2015-11-02 DIAGNOSIS — I454 Nonspecific intraventricular block: Secondary | ICD-10-CM | POA: Diagnosis present

## 2015-11-02 DIAGNOSIS — Z87891 Personal history of nicotine dependence: Secondary | ICD-10-CM

## 2015-11-02 DIAGNOSIS — I25810 Atherosclerosis of coronary artery bypass graft(s) without angina pectoris: Secondary | ICD-10-CM | POA: Diagnosis present

## 2015-11-02 DIAGNOSIS — E870 Hyperosmolality and hypernatremia: Secondary | ICD-10-CM | POA: Diagnosis present

## 2015-11-02 DIAGNOSIS — A09 Infectious gastroenteritis and colitis, unspecified: Secondary | ICD-10-CM | POA: Diagnosis present

## 2015-11-02 DIAGNOSIS — A084 Viral intestinal infection, unspecified: Secondary | ICD-10-CM | POA: Diagnosis present

## 2015-11-02 DIAGNOSIS — E860 Dehydration: Secondary | ICD-10-CM | POA: Diagnosis present

## 2015-11-02 DIAGNOSIS — E785 Hyperlipidemia, unspecified: Secondary | ICD-10-CM | POA: Diagnosis present

## 2015-11-02 DIAGNOSIS — I130 Hypertensive heart and chronic kidney disease with heart failure and stage 1 through stage 4 chronic kidney disease, or unspecified chronic kidney disease: Secondary | ICD-10-CM | POA: Diagnosis present

## 2015-11-02 DIAGNOSIS — T45515A Adverse effect of anticoagulants, initial encounter: Secondary | ICD-10-CM | POA: Diagnosis not present

## 2015-11-02 DIAGNOSIS — E162 Hypoglycemia, unspecified: Secondary | ICD-10-CM

## 2015-11-02 DIAGNOSIS — I440 Atrioventricular block, first degree: Secondary | ICD-10-CM | POA: Diagnosis present

## 2015-11-02 DIAGNOSIS — Z833 Family history of diabetes mellitus: Secondary | ICD-10-CM

## 2015-11-02 DIAGNOSIS — Z820 Family history of epilepsy and other diseases of the nervous system: Secondary | ICD-10-CM

## 2015-11-02 DIAGNOSIS — R6510 Systemic inflammatory response syndrome (SIRS) of non-infectious origin without acute organ dysfunction: Secondary | ICD-10-CM | POA: Diagnosis present

## 2015-11-02 HISTORY — DX: Unspecified hearing loss, unspecified ear: H91.90

## 2015-11-02 HISTORY — DX: Heart failure, unspecified: I50.9

## 2015-11-02 LAB — CBC
HEMATOCRIT: 40.8 % (ref 39.0–52.0)
HEMOGLOBIN: 13.2 g/dL (ref 13.0–17.0)
MCH: 32 pg (ref 26.0–34.0)
MCHC: 32.4 g/dL (ref 30.0–36.0)
MCV: 98.8 fL (ref 78.0–100.0)
Platelets: 167 10*3/uL (ref 150–400)
RBC: 4.13 MIL/uL — AB (ref 4.22–5.81)
RDW: 14.1 % (ref 11.5–15.5)
WBC: 4.7 10*3/uL (ref 4.0–10.5)

## 2015-11-02 LAB — COMPREHENSIVE METABOLIC PANEL
ALT: 32 U/L (ref 17–63)
AST: 25 U/L (ref 15–41)
Albumin: 4 g/dL (ref 3.5–5.0)
Alkaline Phosphatase: 66 U/L (ref 38–126)
Anion gap: 13 (ref 5–15)
BUN: 49 mg/dL — ABNORMAL HIGH (ref 6–20)
CHLORIDE: 115 mmol/L — AB (ref 101–111)
CO2: 18 mmol/L — AB (ref 22–32)
Calcium: 9.3 mg/dL (ref 8.9–10.3)
Creatinine, Ser: 1.9 mg/dL — ABNORMAL HIGH (ref 0.61–1.24)
GFR calc Af Amer: 36 mL/min — ABNORMAL LOW (ref >60)
GFR, EST NON AFRICAN AMERICAN: 31 mL/min — AB (ref >60)
Glucose, Bld: 60 mg/dL — ABNORMAL LOW (ref 65–99)
Potassium: 4.2 mmol/L (ref 3.5–5.1)
SODIUM: 146 mmol/L — AB (ref 135–145)
Total Bilirubin: 0.6 mg/dL (ref 0.3–1.2)
Total Protein: 7.5 g/dL (ref 6.5–8.1)

## 2015-11-02 LAB — LIPASE, BLOOD: LIPASE: 20 U/L (ref 11–51)

## 2015-11-02 MED ORDER — SODIUM CHLORIDE 0.9 % IV BOLUS (SEPSIS)
1000.0000 mL | Freq: Once | INTRAVENOUS | Status: AC
  Administered 2015-11-02: 1000 mL via INTRAVENOUS

## 2015-11-02 MED ORDER — ACETAMINOPHEN 325 MG PO TABS
650.0000 mg | ORAL_TABLET | Freq: Once | ORAL | Status: AC
  Administered 2015-11-02: 650 mg via ORAL
  Filled 2015-11-02: qty 2

## 2015-11-02 NOTE — ED Notes (Signed)
Nurse getting labs 

## 2015-11-02 NOTE — ED Provider Notes (Signed)
By signing my name below, I, Travis Coleman, attest that this documentation has been prepared under the direction and in the presence of Travis Albe, MD. Electronically Signed: Angelene Coleman, ED Scribe. 11/03/2015. 2:43 PM.   Pt is an elderly male sent from his nursing facility with norovirus outbreak with diarrhea that started today. Pt has dementia.   PCP Dr Redmond School  ED Triage Vitals  Enc Vitals Group     BP 11/02/15 2249 163/72 mmHg     Pulse Rate 11/02/15 2249 116     Resp 11/02/15 2249 20     Temp 11/02/15 2249 100.8 F (38.2 C)     Temp Source 11/02/15 2249 Rectal     SpO2 11/02/15 2249 100 %     Weight --      Height --      Head Cir --      Peak Flow --      Pain Score --      Pain Loc --      Pain Edu? --      Excl. in GC? --    Vital signs normal except for fever and tachycardia Pt is awake and alert, he appears confused. His mucous membranes are dry. Pt's abdomen is distended, soft, and appears non-tender  On initial exam patient appears to be dehydrated. He was started on IV fluids.  2 AM patient's initial CBG was 60. When it was rechecked it was down to 30. Patient has been slow to take oral fluids. He was started on D10 drip.   2:40 AM - Rechecked and pt reports SOB with a respiration rate of 26 and on exam, pt has wheezing. Pt will receive repeat x-ray with an albuterol and a nebulizer. Temperature was rechecked and was 101.9. Patient was given acetaminophen 1000 mg orally.  3:30 AM Nurse reports her vomited shortly after he was given the acetaminophen. Was given acetaminophen rectally   Repeat CBG at 348 a.m. on the D10 drip shows blood glucose of 129.  Patient's lactic acidosis improved with IV fluids. Blood cultures and urine culture were obtained. Patient is septic.  05:03 Dr Julian Reil, hospitalist will see patient for admission. Once patient started on Cipro and Flagyl.  Results for orders placed or performed during the hospital encounter of 11/02/15   C difficile quick scan w PCR reflex  Result Value Ref Range   C Diff antigen NEGATIVE NEGATIVE   C Diff toxin NEGATIVE NEGATIVE   C Diff interpretation Negative for toxigenic C. difficile   Lipase, blood  Result Value Ref Range   Lipase 20 11 - 51 U/L  Comprehensive metabolic panel  Result Value Ref Range   Sodium 146 (H) 135 - 145 mmol/L   Potassium 4.2 3.5 - 5.1 mmol/L   Chloride 115 (H) 101 - 111 mmol/L   CO2 18 (L) 22 - 32 mmol/L   Glucose, Bld 60 (L) 65 - 99 mg/dL   BUN 49 (H) 6 - 20 mg/dL   Creatinine, Ser 8.11 (H) 0.61 - 1.24 mg/dL   Calcium 9.3 8.9 - 91.4 mg/dL   Total Protein 7.5 6.5 - 8.1 g/dL   Albumin 4.0 3.5 - 5.0 g/dL   AST 25 15 - 41 U/L   ALT 32 17 - 63 U/L   Alkaline Phosphatase 66 38 - 126 U/L   Total Bilirubin 0.6 0.3 - 1.2 mg/dL   GFR calc non Af Amer 31 (L) >60 mL/min   GFR calc Af Amer 36 (L) >  60 mL/min   Anion gap 13 5 - 15  CBC  Result Value Ref Range   WBC 4.7 4.0 - 10.5 K/uL   RBC 4.13 (L) 4.22 - 5.81 MIL/uL   Hemoglobin 13.2 13.0 - 17.0 g/dL   HCT 16.140.8 09.639.0 - 04.552.0 %   MCV 98.8 78.0 - 100.0 fL   MCH 32.0 26.0 - 34.0 pg   MCHC 32.4 30.0 - 36.0 g/dL   RDW 40.914.1 81.111.5 - 91.415.5 %   Platelets 167 150 - 400 K/uL  Urinalysis, Routine w reflex microscopic (not at [facility])  Result Value Ref Range   Color, Urine YELLOW YELLOW   APPearance CLEAR CLEAR   Specific Gravity, Urine 1.020 1.005 - 1.030   pH 5.0 5.0 - 8.0   Glucose, UA NEGATIVE NEGATIVE mg/dL   Hgb urine dipstick NEGATIVE NEGATIVE   Bilirubin Urine SMALL (A) NEGATIVE   Ketones, ur NEGATIVE NEGATIVE mg/dL   Protein, ur NEGATIVE NEGATIVE mg/dL   Nitrite NEGATIVE NEGATIVE   Leukocytes, UA NEGATIVE NEGATIVE  I-Stat CG4 Lactic Acid, ED  Result Value Ref Range   Lactic Acid, Venous 2.49 (HH) 0.5 - 2.0 mmol/L   Comment NOTIFIED PHYSICIAN   CBG monitoring, ED  Result Value Ref Range   Glucose-Capillary 30 (LL) 65 - 99 mg/dL  I-Stat CG4 Lactic Acid, ED  Result Value Ref Range   Lactic Acid,  Venous 1.92 0.5 - 2.0 mmol/L  POC CBG, ED  Result Value Ref Range   Glucose-Capillary 129 (H) 65 - 99 mg/dL   Laboratory interpretation all normal except hypernatremia, high chloride, low bicarb with normal anion gap, renal insufficiency, hypoglycemia, elevated lactic acid    Ct Abdomen Pelvis Wo Contrast  11/03/2015  CLINICAL DATA:  Acute onset of diarrhea and generalized abdominal distention. Initial encounter. EXAM: CT ABDOMEN AND PELVIS WITHOUT CONTRAST TECHNIQUE: Multidetector CT imaging of the abdomen and pelvis was performed following the standard protocol without IV contrast. COMPARISON:  None. FINDINGS: Mild bibasilar atelectasis is noted. The patient is status post median sternotomy. Diffuse coronary artery calcifications are seen. The liver and spleen are unremarkable in appearance. The gallbladder is within normal limits. The pancreas and adrenal glands are unremarkable. Mild bilateral renal atrophy is noted. Mild perinephric stranding is noted bilaterally. There is no evidence of hydronephrosis. No renal or ureteral stones are seen. The small bowel is unremarkable in appearance. The stomach is within normal limits. No acute vascular abnormalities are seen. Diffuse calcification is noted along the abdominal aorta and its branches, including along the superior mesenteric artery, inferior mesenteric artery and at the proximal renal arteries bilaterally. The appendix is not definitely seen; there is no evidence for appendicitis. The colon is unremarkable in appearance. Trace fluid at the right lower quadrant is nonspecific. The bladder is mildly distended and grossly unremarkable. The patient is status post hysterectomy. No suspicious adnexal masses are seen. No inguinal lymphadenopathy is seen. No acute osseous abnormalities are identified. IMPRESSION: 1. No definite acute abnormality seen within the abdomen or pelvis. 2. Trace fluid at the right lower quadrant is nonspecific. 3. Diffuse  calcification along the abdominal aorta and its branches, including along the superior mesenteric artery, inferior mesenteric artery and at the proximal renal arteries bilaterally. 4. Mild bibasilar atelectasis noted. 5. Diffuse coronary artery calcifications seen. 6. Mild bilateral renal atrophy noted. Electronically Signed   By: Roanna RaiderJeffery  Chang M.D.   On: 11/03/2015 04:51   Dg Chest 2 View  11/02/2015  CLINICAL DATA:  Diarrhea.  Possible C difficile.  Dementia. EXAM: CHEST  2 VIEW COMPARISON:  06/28/2014 FINDINGS: Postoperative changes in the mediastinum. Shallow inspiration. Mild cardiac enlargement. No significant vascular congestion. No focal airspace disease or consolidation. No blunting of costophrenic angles. No pneumothorax. Mildly tortuous aorta. IMPRESSION: Cardiac enlargement.  No evidence of active pulmonary disease. Electronically Signed   By: Burman Nieves M.D.   On: 11/02/2015 23:54   Dg Chest Port 1 View  11/03/2015  CLINICAL DATA:  Acute shortness of breath and wheezing. EXAM: PORTABLE CHEST 1 VIEW COMPARISON:  11/02/2015 FINDINGS: Postoperative changes in the mediastinum. Shallow inspiration with atelectasis in the lung bases. No focal consolidation in the lungs. No blunting of costophrenic angles. No pneumothorax. Cardiac enlargement without vascular congestion or edema. IMPRESSION: Shallow inspiration with atelectasis in the lung bases. Cardiac enlargement. No evidence of active pulmonary disease. Electronically Signed   By: Burman Nieves M.D.   On: 11/03/2015 03:28 November 2013 Echocardiogram Study Conclusions  - Left ventricle: The cavity size was normal. Systolic function was normal. The estimated ejection fraction was in the range of 55% to 60%. Although no diagnostic regional wall motion abnormality was identified, this possibility cannot be completely excluded on the basis of this study.  Medical screening examination/treatment/procedure(s) were  conducted as a shared visit with non-physician practitioner(s) and myself.  I personally evaluated the patient during the encounter.   EKG Interpretation None      Medical decision-making elderly patient with dementia presents from his nursing facility where they are having an outbreak of the normal virus. Patient developed a fever while in the ED. He had a persistent tachycardia despite being given IV fluids. At one point he started developing wheezing. I reviewed his prior echo and chest x-ray was repeated. I did not feel like he was in failure. He still looked dehydrated to me. His fever was treated. He became hypoglycemic most likely due to poor oral intake in the diarrhea. He was started on a D10 drip due to his inability to drink fluids.   Diagnoses that have been ruled out:  None  Diagnoses that are still under consideration:  None  Final diagnoses:  Diarrhea, unspecified type  Fever, unspecified fever cause  Hypoglycemia  Bronchospasm  Sepsis, unspecified organism (HCC)   Plan admission   Travis Albe, MD, FACEP    CRITICAL CARE Performed by: Travis Coleman L Total critical care time: 45 minutes Critical care time was exclusive of separately billable procedures and treating other patients. Critical care was necessary to treat or prevent imminent or life-threatening deterioration. Critical care was time spent personally by me on the following activities: development of treatment plan with patient and/or surrogate as well as nursing, discussions with consultants, evaluation of patient's response to treatment, examination of patient, obtaining history from patient or surrogate, ordering and performing treatments and interventions, ordering and review of laboratory studies, ordering and review of radiographic studies, pulse oximetry and re-evaluation of patient's condition. ca   I personally performed the services described in this documentation, which was scribed in my presence. The  recorded information has been reviewed and considered.  Travis Albe, MD, Concha Pyo, MD 11/03/15 [phone]

## 2015-11-02 NOTE — ED Notes (Signed)
Bed: [id number] Expected date:  Expected time:  Means of arrival:  Comments: EMS 81 yo male from Assisted Living with diarrhea x4 today-floor he is on has norovirus

## 2015-11-02 NOTE — ED Provider Notes (Signed)
CSN: [id number]     Arrival date & time 11/02/15  2241 History   First MD Initiated Contact with Patient 11/02/15 2254     Chief Complaint  Patient presents with  . Diarrhea     (Consider location/radiation/quality/duration/timing/severity/associated sxs/prior Treatment) HPI   LEVEL 5 CAVEAT.  Patient with dementia.  History provided from EMS and nursing note.  Travis Coleman is a 81 y.o. male with PMH significant for DM, HLD, HTN, CAD, Dementia, PVD who presents via [facility] from [facility] with sudden onset, intermittent, severe diarrhea that began approximately 3 PM this afternoon.  He has had 3 large BMs which were reported as possible c-diff per facility staff.  Patient given Imodium without relief.  EMS reports entire ward at the facility has Norovirus.   Past Medical History  Diagnosis Date  . Diabetes mellitus   . Hyperlipidemia   . Hypertension   . CAD (coronary artery disease) of bypass graft     Multivessel  . Dementia     MMSE 18/30 Feb 2012 [facility]ospital[facility]  . PVD (peripheral vascular disease) (HCC)   . Cataract     bilateral  . Hearing deficit    Past Surgical History  Procedure Laterality Date  . Coronary artery bypass graft     Family History  Problem Relation Age of Onset  . Diabetes Mother   . Dementia Mother     alzheimer  . Dementia Father   . Stroke Father   . Heart disease Father   . Dementia Sister    Social History  Substance Use Topics  . Smoking status: Former Smoker    Quit date: 11/19/1948  . Smokeless tobacco: Never Used  . Alcohol Use: No    Review of Systems  Unable to perform ROS: Dementia       Allergies  Review of patient's allergies indicates no known allergies.  Home Medications   Prior to Admission medications   Medication Sig Start Date End Date Taking? Authorizing Provider  atorvastatin (LIPITOR) 20 MG tablet Take 20 mg by mouth at bedtime.   Yes Historical Provider, MD  divalproex (DEPAKOTE  SPRINKLE) 125 MG capsule Take 250 mg by mouth 3 (three) times daily.    Yes Historical Provider, MD  guaiFENesin (SILTUSSIN DAS) 100 MG/5ML liquid Take 200 mg by mouth every 6 (six) hours as needed for cough.   Yes Historical Provider, MD  LORazepam (ATIVAN) 0.5 MG tablet Take 0.5 mg by mouth 2 (two) times daily as needed for anxiety.   Yes Historical Provider, MD  traZODone (DESYREL) 50 MG tablet Take 25 mg by mouth every 8 (eight) hours as needed (for agitation).   Yes Historical Provider, MD  isosorbide-hydrALAZINE (BIDIL) 20-37.5 MG per tablet Take 1 tablet by mouth 3 (three) times daily. Patient not taking: Reported on 05/20/2015 12/09/13   Coolidge Breezeachel E Chikowski, MD  LORazepam (ATIVAN) 0.5 MG tablet Take 1 tablet (0.5 mg total) by mouth 2 (two) times daily. Patient not taking: Reported on 11/02/2015 12/09/13   Coolidge Breezeachel E Chikowski, MD   BP 174/82 mmHg  Pulse 110  Temp(Src) 100.8 F (38.2 C) (Rectal)  Resp 19  SpO2 99% Physical Exam  Constitutional: He is oriented to person, place, and time. He appears well-developed and well-nourished.  HENT:  Head: Normocephalic and atraumatic.  Mouth/Throat: Oropharynx is clear and moist.  Eyes: Conjunctivae are normal. Pupils are equal, round, and reactive to light.  Neck: Normal range of motion. Neck supple.  Cardiovascular: Normal  rate, regular rhythm and normal heart sounds.   No murmur heard. Pulmonary/Chest: Effort normal and breath sounds normal. No accessory muscle usage or stridor. No respiratory distress. He has no wheezes. He has no rhonchi. He has no rales.  Abdominal: Soft. Bowel sounds are normal. He exhibits no distension. There is no tenderness.  Musculoskeletal: Normal range of motion.  Lymphadenopathy:    He has no cervical adenopathy.  Neurological: He is alert and oriented to person, place, and time.  Speech clear without dysarthria.  Skin: Skin is warm and dry.  Psychiatric: He has a normal mood and affect. His behavior is normal.     ED Course  Procedures (including critical care time) Labs Review Labs Reviewed  COMPREHENSIVE METABOLIC PANEL - Abnormal; Notable for the following:    Sodium 146 (*)    Chloride 115 (*)    CO2 18 (*)    Glucose, Bld 60 (*)    BUN 49 (*)    Creatinine, Ser 1.90 (*)    GFR calc non Af Amer 31 (*)    GFR calc Af Amer 36 (*)    All other components within normal limits  CBC - Abnormal; Notable for the following:    RBC 4.13 (*)    All other components within normal limits  C DIFFICILE QUICK SCREEN W PCR REFLEX  LIPASE, BLOOD  URINALYSIS, ROUTINE W REFLEX MICROSCOPIC (NOT AT [facility])  I-STAT CG4 LACTIC ACID, ED    Imaging Review Dg Chest 2 View  11/02/2015  CLINICAL DATA:  Diarrhea.  Possible C difficile.  Dementia. EXAM: CHEST  2 VIEW COMPARISON:  06/28/2014 FINDINGS: Postoperative changes in the mediastinum. Shallow inspiration. Mild cardiac enlargement. No significant vascular congestion. No focal airspace disease or consolidation. No blunting of costophrenic angles. No pneumothorax. Mildly tortuous aorta. IMPRESSION: Cardiac enlargement.  No evidence of active pulmonary disease. Electronically Signed   By: Burman Nieves M.D.   On: 11/02/2015 23:54   I have personally reviewed and evaluated these images and lab results as part of my medical decision-making.   EKG Interpretation None      MDM   Final diagnoses:  Diarrhea, unspecified type    Patient with dementia presents from assisted living facility with 3 loose stools since 3 PM this afternoon.  Per facility staff, BM concerning for c-diff.  Per EMS, many members of the facility have Norovirus.  Blood pressure 163/72, pulse 116, temperature 100.8 F (38.2 C), temperature source Rectal, resp. rate 20, SpO2 100 %.  Will give fluids and tylenol.  On exam, tachycardia present, lungs CTAB, abdomen soft and benign.  Will obtain labs and stool culture along with c-diff.   CMP--Na 146, Cl 115, CO2 18, glucose 60, Cr 1.9  (baseline).  Will also obtain lactic acid.  Will give juice. Lactic acid 2.49.  Will obtain CT abdomen and pelvis. CBC--WBC 4.7. Hgb 13.2 CXR negative.  Patient care hand off to Dr. Lynelle Doctor at shift change who will follow up on labs and imaging.     Cheri Fowler, PA-C 11/03/15 4540  [name], MD 11/03/15 [phone]

## 2015-11-02 NOTE — ED Notes (Signed)
Pt presents via [facility] from [facility] with ongoing diarrhea since 3pm, 3 large diarrheal bowel movements since that time which are reported as possible C-diff per facility staff.  Pt has advanced alzheimers and stays on the memory ward at the facility.  He states he has no pain at this time.  Vitals 140/80, CBG 101, HR 112, O2 99% on room air.  Pt was given Imodium at the facility with no relief of symptoms.  EMS reports that the entire ward at the facility has norovirus.  Pt placed on enteric precautions on arrival to ED.  No skin turgor per EMS.

## 2015-11-03 ENCOUNTER — Emergency Department (HOSPITAL_COMMUNITY): Payer: Medicare Other

## 2015-11-03 ENCOUNTER — Encounter (HOSPITAL_COMMUNITY): Payer: Self-pay

## 2015-11-03 DIAGNOSIS — Z820 Family history of epilepsy and other diseases of the nervous system: Secondary | ICD-10-CM | POA: Diagnosis not present

## 2015-11-03 DIAGNOSIS — I441 Atrioventricular block, second degree: Secondary | ICD-10-CM | POA: Diagnosis present

## 2015-11-03 DIAGNOSIS — A09 Infectious gastroenteritis and colitis, unspecified: Secondary | ICD-10-CM | POA: Diagnosis not present

## 2015-11-03 DIAGNOSIS — Z794 Long term (current) use of insulin: Secondary | ICD-10-CM | POA: Diagnosis not present

## 2015-11-03 DIAGNOSIS — R6510 Systemic inflammatory response syndrome (SIRS) of non-infectious origin without acute organ dysfunction: Secondary | ICD-10-CM | POA: Diagnosis present

## 2015-11-03 DIAGNOSIS — I10 Essential (primary) hypertension: Secondary | ICD-10-CM

## 2015-11-03 DIAGNOSIS — E1121 Type 2 diabetes mellitus with diabetic nephropathy: Secondary | ICD-10-CM | POA: Diagnosis present

## 2015-11-03 DIAGNOSIS — Z7982 Long term (current) use of aspirin: Secondary | ICD-10-CM | POA: Diagnosis not present

## 2015-11-03 DIAGNOSIS — A419 Sepsis, unspecified organism: Secondary | ICD-10-CM | POA: Diagnosis present

## 2015-11-03 DIAGNOSIS — G309 Alzheimer's disease, unspecified: Secondary | ICD-10-CM | POA: Diagnosis present

## 2015-11-03 DIAGNOSIS — E870 Hyperosmolality and hypernatremia: Secondary | ICD-10-CM | POA: Diagnosis present

## 2015-11-03 DIAGNOSIS — I454 Nonspecific intraventricular block: Secondary | ICD-10-CM | POA: Diagnosis present

## 2015-11-03 DIAGNOSIS — Z833 Family history of diabetes mellitus: Secondary | ICD-10-CM | POA: Diagnosis not present

## 2015-11-03 DIAGNOSIS — N179 Acute kidney failure, unspecified: Secondary | ICD-10-CM | POA: Diagnosis present

## 2015-11-03 DIAGNOSIS — I350 Nonrheumatic aortic (valve) stenosis: Secondary | ICD-10-CM | POA: Diagnosis present

## 2015-11-03 DIAGNOSIS — E1151 Type 2 diabetes mellitus with diabetic peripheral angiopathy without gangrene: Secondary | ICD-10-CM | POA: Diagnosis present

## 2015-11-03 DIAGNOSIS — A084 Viral intestinal infection, unspecified: Secondary | ICD-10-CM | POA: Diagnosis present

## 2015-11-03 DIAGNOSIS — F0391 Unspecified dementia with behavioral disturbance: Secondary | ICD-10-CM

## 2015-11-03 DIAGNOSIS — I130 Hypertensive heart and chronic kidney disease with heart failure and stage 1 through stage 4 chronic kidney disease, or unspecified chronic kidney disease: Secondary | ICD-10-CM | POA: Diagnosis present

## 2015-11-03 DIAGNOSIS — R001 Bradycardia, unspecified: Secondary | ICD-10-CM | POA: Diagnosis present

## 2015-11-03 DIAGNOSIS — Z87891 Personal history of nicotine dependence: Secondary | ICD-10-CM | POA: Diagnosis not present

## 2015-11-03 DIAGNOSIS — Z79899 Other long term (current) drug therapy: Secondary | ICD-10-CM | POA: Diagnosis not present

## 2015-11-03 DIAGNOSIS — R197 Diarrhea, unspecified: Secondary | ICD-10-CM | POA: Diagnosis present

## 2015-11-03 DIAGNOSIS — F0280 Dementia in other diseases classified elsewhere without behavioral disturbance: Secondary | ICD-10-CM | POA: Diagnosis present

## 2015-11-03 DIAGNOSIS — Z8249 Family history of ischemic heart disease and other diseases of the circulatory system: Secondary | ICD-10-CM | POA: Diagnosis not present

## 2015-11-03 DIAGNOSIS — R7989 Other specified abnormal findings of blood chemistry: Secondary | ICD-10-CM | POA: Diagnosis not present

## 2015-11-03 DIAGNOSIS — F0390 Unspecified dementia without behavioral disturbance: Secondary | ICD-10-CM

## 2015-11-03 DIAGNOSIS — D631 Anemia in chronic kidney disease: Secondary | ICD-10-CM | POA: Diagnosis present

## 2015-11-03 DIAGNOSIS — I248 Other forms of acute ischemic heart disease: Secondary | ICD-10-CM | POA: Diagnosis present

## 2015-11-03 DIAGNOSIS — E860 Dehydration: Secondary | ICD-10-CM | POA: Diagnosis present

## 2015-11-03 DIAGNOSIS — E162 Hypoglycemia, unspecified: Secondary | ICD-10-CM | POA: Diagnosis present

## 2015-11-03 DIAGNOSIS — N183 Chronic kidney disease, stage 3 (moderate): Secondary | ICD-10-CM | POA: Diagnosis present

## 2015-11-03 DIAGNOSIS — J9801 Acute bronchospasm: Secondary | ICD-10-CM | POA: Insufficient documentation

## 2015-11-03 DIAGNOSIS — E11649 Type 2 diabetes mellitus with hypoglycemia without coma: Secondary | ICD-10-CM | POA: Diagnosis present

## 2015-11-03 DIAGNOSIS — E118 Type 2 diabetes mellitus with unspecified complications: Secondary | ICD-10-CM | POA: Diagnosis not present

## 2015-11-03 DIAGNOSIS — I440 Atrioventricular block, first degree: Secondary | ICD-10-CM | POA: Diagnosis present

## 2015-11-03 DIAGNOSIS — D6959 Other secondary thrombocytopenia: Secondary | ICD-10-CM | POA: Diagnosis not present

## 2015-11-03 DIAGNOSIS — I25810 Atherosclerosis of coronary artery bypass graft(s) without angina pectoris: Secondary | ICD-10-CM | POA: Diagnosis present

## 2015-11-03 DIAGNOSIS — E785 Hyperlipidemia, unspecified: Secondary | ICD-10-CM | POA: Diagnosis present

## 2015-11-03 DIAGNOSIS — Z823 Family history of stroke: Secondary | ICD-10-CM | POA: Diagnosis not present

## 2015-11-03 DIAGNOSIS — E1122 Type 2 diabetes mellitus with diabetic chronic kidney disease: Secondary | ICD-10-CM | POA: Diagnosis present

## 2015-11-03 DIAGNOSIS — T45515A Adverse effect of anticoagulants, initial encounter: Secondary | ICD-10-CM | POA: Diagnosis not present

## 2015-11-03 LAB — I-STAT CG4 LACTIC ACID, ED
LACTIC ACID, VENOUS: 2.49 mmol/L — AB (ref 0.5–2.0)
LACTIC ACID, VENOUS: 1.92 mmol/L (ref 0.5–2.0)
Lactic Acid, Venous: 1.21 mmol/L (ref 0.5–2.0)

## 2015-11-03 LAB — CBC WITH DIFFERENTIAL/PLATELET
Basophils Absolute: 0 10*3/uL (ref 0.0–0.1)
Basophils Relative: 0 %
EOS ABS: 0.2 10*3/uL (ref 0.0–0.7)
Eosinophils Relative: 3 %
HCT: 39.5 % (ref 39.0–52.0)
HEMOGLOBIN: 12.6 g/dL — AB (ref 13.0–17.0)
LYMPHS ABS: 1 10*3/uL (ref 0.7–4.0)
LYMPHS PCT: 16 %
MCH: 31.6 pg (ref 26.0–34.0)
MCHC: 31.9 g/dL (ref 30.0–36.0)
MCV: 99 fL (ref 78.0–100.0)
MONOS PCT: 12 %
Monocytes Absolute: 0.8 10*3/uL (ref 0.1–1.0)
NEUTROS PCT: 69 %
Neutro Abs: 4.6 10*3/uL (ref 1.7–7.7)
Platelets: 137 10*3/uL — ABNORMAL LOW (ref 150–400)
RBC: 3.99 MIL/uL — ABNORMAL LOW (ref 4.22–5.81)
RDW: 14.2 % (ref 11.5–15.5)
WBC: 6.7 10*3/uL (ref 4.0–10.5)

## 2015-11-03 LAB — COMPREHENSIVE METABOLIC PANEL
ALK PHOS: 49 U/L (ref 38–126)
ALT: 26 U/L (ref 17–63)
ANION GAP: 10 (ref 5–15)
AST: 26 U/L (ref 15–41)
Albumin: 3 g/dL — ABNORMAL LOW (ref 3.5–5.0)
BILIRUBIN TOTAL: 0.7 mg/dL (ref 0.3–1.2)
BUN: 38 mg/dL — ABNORMAL HIGH (ref 6–20)
CALCIUM: 7.6 mg/dL — AB (ref 8.9–10.3)
CO2: 15 mmol/L — AB (ref 22–32)
CREATININE: 1.63 mg/dL — AB (ref 0.61–1.24)
Chloride: 116 mmol/L — ABNORMAL HIGH (ref 101–111)
GFR, EST AFRICAN AMERICAN: 44 mL/min — AB (ref >60)
GFR, EST NON AFRICAN AMERICAN: 38 mL/min — AB (ref >60)
Glucose, Bld: 173 mg/dL — ABNORMAL HIGH (ref 65–99)
Potassium: 3.7 mmol/L (ref 3.5–5.1)
SODIUM: 141 mmol/L (ref 135–145)
TOTAL PROTEIN: 5.8 g/dL — AB (ref 6.5–8.1)

## 2015-11-03 LAB — CBG MONITORING, ED
GLUCOSE-CAPILLARY: 150 mg/dL — AB (ref 65–99)
GLUCOSE-CAPILLARY: 158 mg/dL — AB (ref 65–99)
GLUCOSE-CAPILLARY: 145 mg/dL — AB (ref 65–99)
Glucose-Capillary: 30 mg/dL — CL (ref 65–99)
Glucose-Capillary: 129 mg/dL — ABNORMAL HIGH (ref 65–99)
Glucose-Capillary: 146 mg/dL — ABNORMAL HIGH (ref 65–99)
Glucose-Capillary: 124 mg/dL — ABNORMAL HIGH (ref 65–99)

## 2015-11-03 LAB — URINALYSIS, ROUTINE W REFLEX MICROSCOPIC
GLUCOSE, UA: NEGATIVE mg/dL
Hgb urine dipstick: NEGATIVE
Ketones, ur: NEGATIVE mg/dL
LEUKOCYTES UA: NEGATIVE
NITRITE: NEGATIVE
PROTEIN: NEGATIVE mg/dL
Specific Gravity, Urine: 1.02 (ref 1.005–1.030)
pH: 5 (ref 5.0–8.0)

## 2015-11-03 LAB — MRSA PCR SCREENING: MRSA by PCR: NEGATIVE

## 2015-11-03 LAB — GLUCOSE, CAPILLARY
Glucose-Capillary: 148 mg/dL — ABNORMAL HIGH (ref 65–99)
Glucose-Capillary: 151 mg/dL — ABNORMAL HIGH (ref 65–99)

## 2015-11-03 LAB — C DIFFICILE QUICK SCREEN W PCR REFLEX
C DIFFICILE (CDIFF) INTERP: NEGATIVE
C DIFFICILE (CDIFF) TOXIN: NEGATIVE
C Diff antigen: NEGATIVE

## 2015-11-03 LAB — TROPONIN I: Troponin I: 1.06 ng/mL

## 2015-11-03 MED ORDER — INSULIN ASPART 100 UNIT/ML ~~LOC~~ SOLN
0.0000 [IU] | SUBCUTANEOUS | Status: DC
  Administered 2015-11-03: 1 [IU] via SUBCUTANEOUS
  Administered 2015-11-03: 2 [IU] via SUBCUTANEOUS
  Administered 2015-11-03 – 2015-11-04 (×3): 1 [IU] via SUBCUTANEOUS
  Administered 2015-11-04: 2 [IU] via SUBCUTANEOUS
  Administered 2015-11-04 – 2015-11-05 (×3): 1 [IU] via SUBCUTANEOUS
  Administered 2015-11-05: 3 [IU] via SUBCUTANEOUS
  Administered 2015-11-05: 2 [IU] via SUBCUTANEOUS
  Administered 2015-11-05: 1 [IU] via SUBCUTANEOUS
  Administered 2015-11-05 – 2015-11-06 (×2): 2 [IU] via SUBCUTANEOUS
  Administered 2015-11-06: 1 [IU] via SUBCUTANEOUS
  Administered 2015-11-06: 2 [IU] via SUBCUTANEOUS
  Administered 2015-11-06: 1 [IU] via SUBCUTANEOUS
  Administered 2015-11-07 (×2): 2 [IU] via SUBCUTANEOUS
  Filled 2015-11-03: qty 1

## 2015-11-03 MED ORDER — ACETAMINOPHEN 500 MG PO TABS
1000.0000 mg | ORAL_TABLET | Freq: Once | ORAL | Status: DC
  Filled 2015-11-03: qty 2

## 2015-11-03 MED ORDER — DONEPEZIL HCL 10 MG PO TABS
10.0000 mg | ORAL_TABLET | Freq: Every day | ORAL | Status: DC
  Filled 2015-11-03: qty 1

## 2015-11-03 MED ORDER — DIVALPROEX SODIUM 125 MG PO CSDR
250.0000 mg | DELAYED_RELEASE_CAPSULE | Freq: Three times a day (TID) | ORAL | Status: DC
  Administered 2015-11-03 – 2015-11-07 (×12): 250 mg via ORAL
  Filled 2015-11-03 (×17): qty 2

## 2015-11-03 MED ORDER — METRONIDAZOLE IN NACL 5-0.79 MG/ML-% IV SOLN
500.0000 mg | Freq: Once | INTRAVENOUS | Status: AC
  Administered 2015-11-03: 500 mg via INTRAVENOUS
  Filled 2015-11-03: qty 100

## 2015-11-03 MED ORDER — SODIUM CHLORIDE 0.9 % IV SOLN
INTRAVENOUS | Status: DC
  Administered 2015-11-03 (×2): via INTRAVENOUS

## 2015-11-03 MED ORDER — IOHEXOL 300 MG/ML  SOLN
25.0000 mL | Freq: Once | INTRAMUSCULAR | Status: AC | PRN
  Administered 2015-11-03: 25 mL via ORAL

## 2015-11-03 MED ORDER — ACETAMINOPHEN 650 MG RE SUPP
650.0000 mg | Freq: Once | RECTAL | Status: AC
Start: 2015-11-03 — End: 2015-11-03
  Administered 2015-11-03: 650 mg via RECTAL
  Filled 2015-11-03: qty 1

## 2015-11-03 MED ORDER — SODIUM CHLORIDE 0.9 % IV BOLUS (SEPSIS)
1000.0000 mL | Freq: Once | INTRAVENOUS | Status: AC
  Administered 2015-11-03: 1000 mL via INTRAVENOUS

## 2015-11-03 MED ORDER — DEXTROSE 50 % IV SOLN
1.0000 | Freq: Once | INTRAVENOUS | Status: AC
  Administered 2015-11-03: 50 mL via INTRAVENOUS
  Filled 2015-11-03: qty 50

## 2015-11-03 MED ORDER — FERROUS SULFATE 325 (65 FE) MG PO TABS
325.0000 mg | ORAL_TABLET | Freq: Every day | ORAL | Status: DC
Start: 2015-11-03 — End: 2015-11-07
  Administered 2015-11-03 – 2015-11-07 (×5): 325 mg via ORAL
  Filled 2015-11-03 (×6): qty 1

## 2015-11-03 MED ORDER — MEMANTINE HCL ER 14 MG PO CP24
14.0000 mg | ORAL_CAPSULE | Freq: Every day | ORAL | Status: DC
  Administered 2015-11-03: 14 mg via ORAL
  Filled 2015-11-03: qty 1

## 2015-11-03 MED ORDER — HYDRALAZINE HCL 10 MG PO TABS
10.0000 mg | ORAL_TABLET | Freq: Three times a day (TID) | ORAL | Status: DC
  Filled 2015-11-03 (×3): qty 1

## 2015-11-03 MED ORDER — ALBUTEROL SULFATE (2.5 MG/3ML) 0.083% IN NEBU
5.0000 mg | INHALATION_SOLUTION | Freq: Once | RESPIRATORY_TRACT | Status: AC
  Administered 2015-11-03: 5 mg via RESPIRATORY_TRACT
  Filled 2015-11-03: qty 6

## 2015-11-03 MED ORDER — ATORVASTATIN CALCIUM 10 MG PO TABS
20.0000 mg | ORAL_TABLET | Freq: Every day | ORAL | Status: DC
  Administered 2015-11-03 – 2015-11-04 (×2): 20 mg via ORAL
  Administered 2015-11-05: 10 mg via ORAL
  Administered 2015-11-06: 20 mg via ORAL
  Filled 2015-11-03: qty 1
  Filled 2015-11-03 (×4): qty 2

## 2015-11-03 MED ORDER — ALBUTEROL SULFATE (2.5 MG/3ML) 0.083% IN NEBU: 2.5000 mg | INHALATION_SOLUTION | Freq: Four times a day (QID) | RESPIRATORY_TRACT | Status: DC | PRN

## 2015-11-03 MED ORDER — HEPARIN SODIUM (PORCINE) 5000 UNIT/ML IJ SOLN
5000.0000 [IU] | Freq: Three times a day (TID) | INTRAMUSCULAR | Status: DC
  Administered 2015-11-03 – 2015-11-06 (×9): 5000 [IU] via SUBCUTANEOUS
  Filled 2015-11-03 (×13): qty 1

## 2015-11-03 MED ORDER — ASPIRIN EC 81 MG PO TBEC
81.0000 mg | DELAYED_RELEASE_TABLET | Freq: Every day | ORAL | Status: DC
  Administered 2015-11-03 – 2015-11-07 (×5): 81 mg via ORAL
  Filled 2015-11-03 (×5): qty 1

## 2015-11-03 MED ORDER — IPRATROPIUM BROMIDE 0.02 % IN SOLN
0.5000 mg | Freq: Once | RESPIRATORY_TRACT | Status: AC
  Administered 2015-11-03: 0.5 mg via RESPIRATORY_TRACT
  Filled 2015-11-03: qty 2.5

## 2015-11-03 MED ORDER — DEXTROSE 10 % IV SOLN
INTRAVENOUS | Status: DC
  Administered 2015-11-03: 03:00:00 via INTRAVENOUS
  Filled 2015-11-03: qty 1000

## 2015-11-03 MED ORDER — VANCOMYCIN HCL IN DEXTROSE 1-5 GM/200ML-% IV SOLN: 1000.0000 mg | Freq: Once | INTRAVENOUS | Status: DC

## 2015-11-03 MED ORDER — PIPERACILLIN-TAZOBACTAM 3.375 G IVPB: 3.3750 g | Freq: Once | INTRAVENOUS | Status: DC

## 2015-11-03 MED ORDER — CIPROFLOXACIN IN D5W 400 MG/200ML IV SOLN
400.0000 mg | Freq: Once | INTRAVENOUS | Status: AC
  Administered 2015-11-03: 400 mg via INTRAVENOUS
  Filled 2015-11-03: qty 200

## 2015-11-03 NOTE — ED Notes (Signed)
Notified MD of CBG result. See MAR for orders received.

## 2015-11-03 NOTE — Progress Notes (Signed)
[facility] PLAN OF CARE NOTE  Patient: Travis Coleman   ZOX:[id number]  PCP: Florentina JennyRIPP, HENRY, MD   DOB: [birth year]  DOA: 11/02/2015   DOS: 11/03/2015   Plan of care: The patient was initially admitted by Dr gardener earlier, reviewed the H&P as well as assessment and plan. he has fever, tachycardia, elevate lactic acid as well as diarrhea as chief complain and AKI. CT abdomen was negative and C diff was negative. suspected viral gastroenteritis. Patient has been doing better as per the RN in ER and I was requested by ICU to reconsider patient's bed placement. Since patient was hemodynamically stable and did not have any acute complain the patient was decided to be transferred to telemetry.  While the patient was in telemetry and eating his lunch, the patient started having episodes of skipped beats. He is heart rate went down to 28. All this time the patient remained asymptomatic. His blood pressure was stable as well and mentation was also stable. His repeat EKG showed variable PR interval, telemetry rhythm shows some evidence of AV dissociation suggesting some high-grade second degree heart block or complete heart block. Discussed with cardiology who will follow-up with the patient. Pacer pads are ordered. As per the request of the floor, the patient was transferred to step down unit for close monitoring. Attempt to reach patient's family were not successful. Patient is full code as per my discussion with nurse taking care at patient's facility. Patient is not oriented at present have code status discussion.  Checking troponin BMP and magnesium level.  Author: Lynden OxfordPranav Raveena Hebdon, MD [facility] Pager: [phone] 11/03/2015 4:00 PM   If 7PM-7AM, please contact night-coverage at [email or web address], password [email or web address]

## 2015-11-03 NOTE — ED Notes (Signed)
MD hospitalist at bedside.

## 2015-11-03 NOTE — Progress Notes (Signed)
CRITICAL VALUE ALERT  Critical value received:  Troponin 1.06  Date of notification:  11/03/2015  Time of notification:  2258  Critical value read back:Yes.    Nurse who received alert:  S.Ausencio Vaden, RN  MD notified (1st page):  K. Schorr  Time of first page:  2300  MD notified (2nd page):  Time of second page:  Responding MD: Merdis DelayK. Schorr Time MD responded:  [phone]

## 2015-11-03 NOTE — H&P (Signed)
[facility] History and Physical  Travis ChaletDonald C Rens YQM:[id number] DOB: [birth year] DOA: 11/02/2015  Referring physician: EDP PCP: Dois DavenportICHTER,KAREN L., MD   Chief Complaint: Diarrhea   HPI: Travis Coleman is a 81 y.o. male with h/o DM, HLD, dementia, lives at SNF.  His ward of the SNF is currently undergoing a massive outbreak of norovirus, per NH staff via EMS report, most patients in that area have come down with symptoms.  Today the patient developed fever (101.7 in ED), and diarrhea.  Review of Systems: unable to perform due to dementia.  Past Medical History  Diagnosis Date  . Diabetes mellitus   . Hyperlipidemia   . Hypertension   . CAD (coronary artery disease) of bypass graft     Multivessel  . Dementia     MMSE 18/30 Feb 2012 [facility](Hospital)  . PVD (peripheral vascular disease) (HCC)   . Cataract     bilateral  . Hearing deficit    Past Surgical History  Procedure Laterality Date  . Coronary artery bypass graft     Social History:  reports that he quit smoking about 67 years ago. He has never used smokeless tobacco. He reports that he does not drink alcohol or use illicit drugs.  No Known Allergies  Family History  Problem Relation Age of Onset  . Diabetes Mother   . Dementia Mother     alzheimer  . Dementia Father   . Stroke Father   . Heart disease Father   . Dementia Sister      Prior to Admission medications   Medication Sig Start Date End Date Taking? Authorizing Provider  aspirin EC 81 MG tablet Take 81 mg by mouth daily.   Yes Historical Provider, MD  atorvastatin (LIPITOR) 20 MG tablet Take 20 mg by mouth at bedtime.   Yes Historical Provider, MD  cholecalciferol (VITAMIN D) 1000 UNITS tablet Take 2,000 Units by mouth daily.   Yes Historical Provider, MD  divalproex (DEPAKOTE SPRINKLE) 125 MG capsule Take 250 mg by mouth 3 (three) times daily.    Yes Historical Provider, MD  donepezil (ARICEPT) 10 MG tablet Take 10 mg by mouth at bedtime.   Yes  Historical Provider, MD  enalapril (VASOTEC) 10 MG tablet Take 10 mg by mouth daily.   Yes Historical Provider, MD  ferrous sulfate 325 (65 FE) MG tablet Take 325 mg by mouth daily with breakfast.   Yes Historical Provider, MD  furosemide (LASIX) 20 MG tablet Take 20 mg by mouth daily.   Yes Historical Provider, MD  guaiFENesin (SILTUSSIN DAS) 100 MG/5ML liquid Take 200 mg by mouth every 6 (six) hours as needed for cough.   Yes Historical Provider, MD  hydrALAZINE (APRESOLINE) 10 MG tablet Take 10 mg by mouth 3 (three) times daily.   Yes Historical Provider, MD  insulin aspart (NOVOLOG FLEXPEN) 100 UNIT/ML FlexPen Inject 10 Units into the skin 3 (three) times daily with meals. If BS is less than 75 or he does not eat   Yes Historical Provider, MD  insulin glargine (LANTUS) 100 UNIT/ML injection Inject 20 Units into the skin daily. Hold if less BS less than 75   Yes Historical Provider, MD  LORazepam (ATIVAN) 0.5 MG tablet Take 0.5 mg by mouth 2 (two) times daily as needed for anxiety.   Yes Historical Provider, MD  memantine (NAMENDA XR) 14 MG CP24 24 hr capsule Take 14 mg by mouth daily.   Yes Historical Provider, MD  sodium chloride 1 G  tablet Take 1 g by mouth daily.   Yes Historical Provider, MD  traZODone (DESYREL) 50 MG tablet Take 25 mg by mouth every 8 (eight) hours as needed (for agitation).   Yes Historical Provider, MD   Physical Exam: Filed Vitals:   11/03/15 0234 11/03/15 0246  BP: 161/75   Pulse: 90   Temp:  101.7 F (38.7 C)  Resp: 30     BP 161/75 mmHg  Pulse 90  Temp(Src) 101.7 F (38.7 C) (Rectal)  Resp 30  SpO2 96%  General Appearance:    Alert, demented, no distress, appears stated age  Head:    Normocephalic, atraumatic  Eyes:    PERRL, EOMI, sclera non-icteric        Nose:   Nares without drainage or epistaxis. Mucosa, turbinates normal  Throat:   Moist mucous membranes. Oropharynx without erythema or exudate.  Neck:   Supple. No carotid bruits.  No  thyromegaly.  No lymphadenopathy.   Back:     No CVA tenderness, no spinal tenderness  Lungs:     Few wheezes, tachypnic  Chest wall:    No tenderness to palpitation  Heart:    Regular rate and rhythm without murmurs, gallops, rubs  Abdomen:     Soft, non-tender, nondistended, normal bowel sounds, no organomegaly  Genitalia:    deferred  Rectal:    deferred  Extremities:   No clubbing, cyanosis or edema.  Pulses:   2+ and symmetric all extremities  Skin:   Skin color, texture, turgor normal, no rashes or lesions  Lymph nodes:   Cervical, supraclavicular, and axillary nodes normal  Neurologic:   CNII-XII intact. Normal strength, sensation and reflexes      throughout    Labs on Admission:  Basic Metabolic Panel:  Recent Labs Lab 11/02/15 2316  NA 146*  K 4.2  CL 115*  CO2 18*  GLUCOSE 60*  BUN 49*  CREATININE 1.90*  CALCIUM 9.3   Liver Function Tests:  Recent Labs Lab 11/02/15 2316  AST 25  ALT 32  ALKPHOS 66  BILITOT 0.6  PROT 7.5  ALBUMIN 4.0    Recent Labs Lab 11/02/15 2316  LIPASE 20   No results for input(s): AMMONIA in the last 168 hours. CBC:  Recent Labs Lab 11/02/15 2316  WBC 4.7  HGB 13.2  HCT 40.8  MCV 98.8  PLT 167   Cardiac Enzymes: No results for input(s): CKTOTAL, CKMB, CKMBINDEX, TROPONINI in the last 168 hours.  BNP (last 3 results) No results for input(s): PROBNP in the last 8760 hours. CBG:  Recent Labs Lab 11/03/15 0157 11/03/15 0348  GLUCAP 30* 129*    Radiological Exams on Admission: Ct Abdomen Pelvis Wo Contrast  11/03/2015  CLINICAL DATA:  Acute onset of diarrhea and generalized abdominal distention. Initial encounter. EXAM: CT ABDOMEN AND PELVIS WITHOUT CONTRAST TECHNIQUE: Multidetector CT imaging of the abdomen and pelvis was performed following the standard protocol without IV contrast. COMPARISON:  None. FINDINGS: Mild bibasilar atelectasis is noted. The patient is status post median sternotomy. Diffuse coronary  artery calcifications are seen. The liver and spleen are unremarkable in appearance. The gallbladder is within normal limits. The pancreas and adrenal glands are unremarkable. Mild bilateral renal atrophy is noted. Mild perinephric stranding is noted bilaterally. There is no evidence of hydronephrosis. No renal or ureteral stones are seen. The small bowel is unremarkable in appearance. The stomach is within normal limits. No acute vascular abnormalities are seen. Diffuse calcification is noted along  the abdominal aorta and its branches, including along the superior mesenteric artery, inferior mesenteric artery and at the proximal renal arteries bilaterally. The appendix is not definitely seen; there is no evidence for appendicitis. The colon is unremarkable in appearance. Trace fluid at the right lower quadrant is nonspecific. The bladder is mildly distended and grossly unremarkable. The patient is status post hysterectomy. No suspicious adnexal masses are seen. No inguinal lymphadenopathy is seen. No acute osseous abnormalities are identified. IMPRESSION: 1. No definite acute abnormality seen within the abdomen or pelvis. 2. Trace fluid at the right lower quadrant is nonspecific. 3. Diffuse calcification along the abdominal aorta and its branches, including along the superior mesenteric artery, inferior mesenteric artery and at the proximal renal arteries bilaterally. 4. Mild bibasilar atelectasis noted. 5. Diffuse coronary artery calcifications seen. 6. Mild bilateral renal atrophy noted. Electronically Signed   By: Roanna Raider M.D.   On: 11/03/2015 04:51   Dg Chest 2 View  11/02/2015  CLINICAL DATA:  Diarrhea.  Possible C difficile.  Dementia. EXAM: CHEST  2 VIEW COMPARISON:  06/28/2014 FINDINGS: Postoperative changes in the mediastinum. Shallow inspiration. Mild cardiac enlargement. No significant vascular congestion. No focal airspace disease or consolidation. No blunting of costophrenic angles. No  pneumothorax. Mildly tortuous aorta. IMPRESSION: Cardiac enlargement.  No evidence of active pulmonary disease. Electronically Signed   By: Burman Nieves M.D.   On: 11/02/2015 23:54   Dg Chest Port 1 View  11/03/2015  CLINICAL DATA:  Acute shortness of breath and wheezing. EXAM: PORTABLE CHEST 1 VIEW COMPARISON:  11/02/2015 FINDINGS: Postoperative changes in the mediastinum. Shallow inspiration with atelectasis in the lung bases. No focal consolidation in the lungs. No blunting of costophrenic angles. No pneumothorax. Cardiac enlargement without vascular congestion or edema. IMPRESSION: Shallow inspiration with atelectasis in the lung bases. Cardiac enlargement. No evidence of active pulmonary disease. Electronically Signed   By: Burman Nieves M.D.   On: 11/03/2015 03:10    EKG: Independently reviewed.  Assessment/Plan Principal Problem:   Infectious diarrhea in adult patient Active Problems:   Hypertension   Diabetes mellitus type 2 with complications (HCC)   Dementia   AKI (acute kidney injury) (HCC)   1. Infectious diarrhea in adult patient - 1. Getting cipro / flagyl in ED for single dose now 2. Will hold off on ordering additional ABx at this point due to the very high suspicion of a specific viral organism (norovirus, see HPI).  Also has no other obvious source of infection, CT abd / pelvis negative, etc. 3. C.Diff is negative 4. GI pathogen panel is pending 5. IVF 2. AKI - Holding nephrotoxic ACEi and lasix, giving IVF 3. HTN - continue home hydralazine, hold ACEi 4. DM2 - holding home lantus and mealtime coverage due to hypoglycemia in ED, putting patient on SSI Q4H, currently on D10, CBG checks ordered Q1H for now    Code Status: Full (presumed)  Family Communication: No family in room Disposition Plan: Admit to inpatient   Time spent: 70 min  Anjuli Gemmill M. [facility] Pager [phone]  If 7AM-7PM, please contact the day team taking care of the  patient [email or web address] Password [facility] 11/03/2015, 5:39 AM

## 2015-11-03 NOTE — Progress Notes (Signed)
Attempted to place pacemaker on patient per order.  Pt hitting nurses hand away and trying to rip it off.  Will place pacer by beside and continue to monitor closely.

## 2015-11-03 NOTE — Progress Notes (Addendum)
Telemetry called:  Patient went into ventricular standstill to complete heartblock. Total lasting around 35-40 sec, HR low as 28. VSS. Asymptomatic.  MD made aware.  Will continue to monitor closely.

## 2015-11-03 NOTE — ED Notes (Signed)
Notified MD of change in pt's breathing pattern.

## 2015-11-03 NOTE — Consult Note (Signed)
Referring Physician:  [name] is an 81 y.o. male.                       Chief Complaint: Bradycardia  HPI: 81 year old male with known history of asymptomatic bradycardia had improved with discontinuation of B- blocker. This hospital admission for fever and diarrhea and possible viral gastroenteritis he had heart rate down to 28 with variable AV block. He is on Namenda and Aricept both of which have small possibility of worsening AV block.  Past Medical History  Diagnosis Date  . Diabetes mellitus   . Hyperlipidemia   . Hypertension   . CAD (coronary artery disease) of bypass graft     Multivessel  . Dementia     MMSE 18/30 Feb 2012 [facility])  . PVD (peripheral vascular disease) ([place])   . Cataract     bilateral  . Hearing deficit   . CHF (congestive heart failure) [facility])       Past Surgical History  Procedure Laterality Date  . Coronary artery bypass graft      Family History  Problem Relation Age of Onset  . Diabetes Mother   . Dementia Mother     alzheimer  . Dementia Father   . Stroke Father   . Heart disease Father   . Dementia Sister    Social History:  reports that he quit smoking about 67 years ago. He has never used smokeless tobacco. He reports that he does not drink alcohol or use illicit drugs.  Allergies: No Known Allergies  Medications Prior to Admission  Medication Sig Dispense Refill  . aspirin EC 81 MG tablet Take 81 mg by mouth daily.    Marland Kitchen atorvastatin (LIPITOR) 20 MG tablet Take 20 mg by mouth at bedtime.    . cholecalciferol (VITAMIN D) 1000 UNITS tablet Take 2,000 Units by mouth daily.    . divalproex (DEPAKOTE SPRINKLE) 125 MG capsule Take 250 mg by mouth 3 (three) times daily.     Marland Kitchen donepezil (ARICEPT) 10 MG tablet Take 10 mg by mouth at bedtime.    . enalapril (VASOTEC) 10 MG tablet Take 10 mg by mouth daily.    . ferrous sulfate 325 (65 FE) MG tablet Take 325 mg by mouth daily with breakfast.    . furosemide (LASIX) 20 MG tablet Take  20 mg by mouth daily.    Marland Kitchen guaiFENesin (SILTUSSIN DAS) 100 MG/5ML liquid Take 200 mg by mouth every 6 (six) hours as needed for cough.    . hydrALAZINE (APRESOLINE) 10 MG tablet Take 10 mg by mouth 3 (three) times daily.    . insulin aspart (NOVOLOG FLEXPEN) 100 UNIT/ML FlexPen Inject 10 Units into the skin 3 (three) times daily with meals. If BS is less than 75 or he does not eat    . insulin glargine (LANTUS) 100 UNIT/ML injection Inject 20 Units into the skin daily. Hold if less BS less than 75    . LORazepam (ATIVAN) 0.5 MG tablet Take 0.5 mg by mouth 2 (two) times daily as needed for anxiety.    . memantine (NAMENDA XR) 14 MG CP24 24 hr capsule Take 14 mg by mouth daily.    . sodium chloride 1 G tablet Take 1 g by mouth daily.    . traZODone (DESYREL) 50 MG tablet Take 25 mg by mouth every 8 (eight) hours as needed (for agitation).      Results for orders placed or performed  during the hospital encounter of 11/02/15 (from the past 48 hour(s))  Lipase, blood     Status: None   Collection Time: 11/02/15 11:16 PM  Result Value Ref Range   Lipase 20 11 - 51 U/L  Comprehensive metabolic panel     Status: Abnormal   Collection Time: 11/02/15 11:16 PM  Result Value Ref Range   Sodium 146 (H) 135 - 145 mmol/L   Potassium 4.2 3.5 - 5.1 mmol/L   Chloride 115 (H) 101 - 111 mmol/L   CO2 18 (L) 22 - 32 mmol/L   Glucose, Bld 60 (L) 65 - 99 mg/dL   BUN 49 (H) 6 - 20 mg/dL   Creatinine, Ser 1.90 (H) 0.61 - 1.24 mg/dL   Calcium 9.3 8.9 - 10.3 mg/dL   Total Protein 7.5 6.5 - 8.1 g/dL   Albumin 4.0 3.5 - 5.0 g/dL   AST 25 15 - 41 U/L   ALT 32 17 - 63 U/L   Alkaline Phosphatase 66 38 - 126 U/L   Total Bilirubin 0.6 0.3 - 1.2 mg/dL   GFR calc non Af Amer 31 (L) >60 mL/min   GFR calc Af Amer 36 (L) >60 mL/min    Comment: (NOTE) The eGFR has been calculated using the CKD EPI equation. This calculation has not been validated in all clinical situations. eGFR's persistently <60 mL/min signify  possible Chronic Kidney Disease.    Anion gap 13 5 - 15  CBC     Status: Abnormal   Collection Time: 11/02/15 11:16 PM  Result Value Ref Range   WBC 4.7 4.0 - 10.5 K/uL   RBC 4.13 (L) 4.22 - 5.81 MIL/uL   Hemoglobin 13.2 13.0 - 17.0 g/dL   HCT 40.8 39.0 - 52.0 %   MCV 98.8 78.0 - 100.0 fL   MCH 32.0 26.0 - 34.0 pg   MCHC 32.4 30.0 - 36.0 g/dL   RDW 14.1 11.5 - 15.5 %   Platelets 167 150 - 400 K/uL  Urinalysis, Routine w reflex microscopic (not at [facility])     Status: Abnormal   Collection Time: 11/03/15 12:47 AM  Result Value Ref Range   Color, Urine YELLOW YELLOW   APPearance CLEAR CLEAR   Specific Gravity, Urine 1.020 1.005 - 1.030   pH 5.0 5.0 - 8.0   Glucose, UA NEGATIVE NEGATIVE mg/dL   Hgb urine dipstick NEGATIVE NEGATIVE   Bilirubin Urine SMALL (A) NEGATIVE   Ketones, ur NEGATIVE NEGATIVE mg/dL   Protein, ur NEGATIVE NEGATIVE mg/dL   Nitrite NEGATIVE NEGATIVE   Leukocytes, UA NEGATIVE NEGATIVE    Comment: MICROSCOPIC NOT DONE ON URINES WITH NEGATIVE PROTEIN, BLOOD, LEUKOCYTES, NITRITE, OR GLUCOSE <1000 mg/dL.  C difficile quick scan w PCR reflex     Status: None   Collection Time: 11/03/15 12:49 AM  Result Value Ref Range   C Diff antigen NEGATIVE NEGATIVE   C Diff toxin NEGATIVE NEGATIVE   C Diff interpretation Negative for toxigenic C. difficile   I-Stat CG4 Lactic Acid, ED     Status: Abnormal   Collection Time: 11/03/15  1:03 AM  Result Value Ref Range   Lactic Acid, Venous 2.49 (HH) 0.5 - 2.0 mmol/L   Comment NOTIFIED PHYSICIAN   CBG monitoring, ED     Status: Abnormal   Collection Time: 11/03/15  1:57 AM  Result Value Ref Range   Glucose-Capillary 30 (LL) 65 - 99 mg/dL  I-Stat CG4 Lactic Acid, ED     Status: None  Collection Time: 11/03/15  3:48 AM  Result Value Ref Range   Lactic Acid, Venous 1.92 0.5 - 2.0 mmol/L  POC CBG, ED     Status: Abnormal   Collection Time: 11/03/15  3:48 AM  Result Value Ref Range   Glucose-Capillary 129 (H) 65 - 99 mg/dL   CBG monitoring, ED     Status: Abnormal   Collection Time: 11/03/15  3:48 AM  Result Value Ref Range   Glucose-Capillary 146 (H) 65 - 99 mg/dL  I-Stat CG4 Lactic Acid, ED     Status: None   Collection Time: 11/03/15  6:28 AM  Result Value Ref Range   Lactic Acid, Venous 1.21 0.5 - 2.0 mmol/L  CBG monitoring, ED     Status: Abnormal   Collection Time: 11/03/15  6:48 AM  Result Value Ref Range   Glucose-Capillary 150 (H) 65 - 99 mg/dL   Comment 1 Notify RN   CBG monitoring, ED     Status: Abnormal   Collection Time: 11/03/15  8:49 AM  Result Value Ref Range   Glucose-Capillary 124 (H) 65 - 99 mg/dL  CBG monitoring, ED     Status: Abnormal   Collection Time: 11/03/15 10:11 AM  Result Value Ref Range   Glucose-Capillary 158 (H) 65 - 99 mg/dL  CBC with Differential/Platelet     Status: Abnormal   Collection Time: 11/03/15 10:24 AM  Result Value Ref Range   WBC 6.7 4.0 - 10.5 K/uL   RBC 3.99 (L) 4.22 - 5.81 MIL/uL   Hemoglobin 12.6 (L) 13.0 - 17.0 g/dL   HCT 39.5 39.0 - 52.0 %   MCV 99.0 78.0 - 100.0 fL   MCH 31.6 26.0 - 34.0 pg   MCHC 31.9 30.0 - 36.0 g/dL   RDW 14.2 11.5 - 15.5 %   Platelets 137 (L) 150 - 400 K/uL   Neutrophils Relative % 69 %   Neutro Abs 4.6 1.7 - 7.7 K/uL   Lymphocytes Relative 16 %   Lymphs Abs 1.0 0.7 - 4.0 K/uL   Monocytes Relative 12 %   Monocytes Absolute 0.8 0.1 - 1.0 K/uL   Eosinophils Relative 3 %   Eosinophils Absolute 0.2 0.0 - 0.7 K/uL   Basophils Relative 0 %   Basophils Absolute 0.0 0.0 - 0.1 K/uL  Comprehensive metabolic panel     Status: Abnormal   Collection Time: 11/03/15 10:24 AM  Result Value Ref Range   Sodium 141 135 - 145 mmol/L   Potassium 3.7 3.5 - 5.1 mmol/L   Chloride 116 (H) 101 - 111 mmol/L   CO2 15 (L) 22 - 32 mmol/L   Glucose, Bld 173 (H) 65 - 99 mg/dL   BUN 38 (H) 6 - 20 mg/dL   Creatinine, Ser 1.63 (H) 0.61 - 1.24 mg/dL   Calcium 7.6 (L) 8.9 - 10.3 mg/dL   Total Protein 5.8 (L) 6.5 - 8.1 g/dL   Albumin 3.0 (L)  3.5 - 5.0 g/dL   AST 26 15 - 41 U/L   ALT 26 17 - 63 U/L   Alkaline Phosphatase 49 38 - 126 U/L   Total Bilirubin 0.7 0.3 - 1.2 mg/dL   GFR calc non Af Amer 38 (L) >60 mL/min   GFR calc Af Amer 44 (L) >60 mL/min    Comment: (NOTE) The eGFR has been calculated using the CKD EPI equation. This calculation has not been validated in all clinical situations. eGFR's persistently <60 mL/min signify possible Chronic Kidney Disease.  Anion gap 10 5 - 15  CBG monitoring, ED     Status: Abnormal   Collection Time: 11/03/15 12:14 PM  Result Value Ref Range   Glucose-Capillary 145 (H) 65 - 99 mg/dL   Ct Abdomen Pelvis Wo Contrast  11/03/2015  CLINICAL DATA:  Acute onset of diarrhea and generalized abdominal distention. Initial encounter. EXAM: CT ABDOMEN AND PELVIS WITHOUT CONTRAST TECHNIQUE: Multidetector CT imaging of the abdomen and pelvis was performed following the standard protocol without IV contrast. COMPARISON:  None. FINDINGS: Mild bibasilar atelectasis is noted. The patient is status post median sternotomy. Diffuse coronary artery calcifications are seen. The liver and spleen are unremarkable in appearance. The gallbladder is within normal limits. The pancreas and adrenal glands are unremarkable. Mild bilateral renal atrophy is noted. Mild perinephric stranding is noted bilaterally. There is no evidence of hydronephrosis. No renal or ureteral stones are seen. The small bowel is unremarkable in appearance. The stomach is within normal limits. No acute vascular abnormalities are seen. Diffuse calcification is noted along the abdominal aorta and its branches, including along the superior mesenteric artery, inferior mesenteric artery and at the proximal renal arteries bilaterally. The appendix is not definitely seen; there is no evidence for appendicitis. The colon is unremarkable in appearance. Trace fluid at the right lower quadrant is nonspecific. The bladder is mildly distended and grossly  unremarkable. The patient is status post hysterectomy. No suspicious adnexal masses are seen. No inguinal lymphadenopathy is seen. No acute osseous abnormalities are identified. IMPRESSION: 1. No definite acute abnormality seen within the abdomen or pelvis. 2. Trace fluid at the right lower quadrant is nonspecific. 3. Diffuse calcification along the abdominal aorta and its branches, including along the superior mesenteric artery, inferior mesenteric artery and at the proximal renal arteries bilaterally. 4. Mild bibasilar atelectasis noted. 5. Diffuse coronary artery calcifications seen. 6. Mild bilateral renal atrophy noted. Electronically Signed   By: Garald Balding M.D.   On: 11/03/2015 04:51   Dg Chest 2 View  11/02/2015  CLINICAL DATA:  Diarrhea.  Possible C difficile.  Dementia. EXAM: CHEST  2 VIEW COMPARISON:  06/28/2014 FINDINGS: Postoperative changes in the mediastinum. Shallow inspiration. Mild cardiac enlargement. No significant vascular congestion. No focal airspace disease or consolidation. No blunting of costophrenic angles. No pneumothorax. Mildly tortuous aorta. IMPRESSION: Cardiac enlargement.  No evidence of active pulmonary disease. Electronically Signed   By: Lucienne Capers M.D.   On: 11/02/2015 23:54   Dg Chest Port 1 View  11/03/2015  CLINICAL DATA:  Acute shortness of breath and wheezing. EXAM: PORTABLE CHEST 1 VIEW COMPARISON:  11/02/2015 FINDINGS: Postoperative changes in the mediastinum. Shallow inspiration with atelectasis in the lung bases. No focal consolidation in the lungs. No blunting of costophrenic angles. No pneumothorax. Cardiac enlargement without vascular congestion or edema. IMPRESSION: Shallow inspiration with atelectasis in the lung bases. Cardiac enlargement. No evidence of active pulmonary disease. Electronically Signed   By: Lucienne Capers M.D.   On: 11/03/2015 03:10    Review Of Systems No weight gain or loss, + CAD, + hypertension, + DM, II, + Dementia, +  CABG, + PVD.  Blood pressure 150/46, pulse 98, temperature 97.6 F (36.4 C), temperature source Oral, resp. rate 28, height _0  (1.727 m), weight 99.4 kg (219 lb 2.2 oz), SpO2 98 %. Physical Exam: General: sitting up in bed, NAD.  HEENT: blue eye, conj-pink, Sclera- non-icteric.  Neck: Faint bruit bil, No JVD. Cardiac: RRR, II/VI systolic murmur. Pulm: clear to auscultation bilaterally.  Abd: soft, nontender, nondistended, BS present  Ext: warm and well perfused, trace pitting edema to BLE  Neuro: alert and oriented X1, cranial nerves II-XII grossly intact, equal strength in b/l upper and lower extremities, responds to commands. Mild action tremor in both hands but stable at rest. Skin-Cool and moist. Tight skin of fingers and toes.  Assessment/Plan Sinus rhythm with 1st degree AV block and IRBBB. Bradyarrhythmia, asymptomatic Chronic kidney disease, III DM, II Hypertension Dyslipidemia  Agree with r/o MI. Check TSH. LV function by echocardiogram. Hold Namenda and Aricept.   Birdie Riddle, MD  11/03/2015, 6:22 PM

## 2015-11-03 NOTE — Progress Notes (Signed)
Contacted Dr. Algie CofferKadakia to inform him of patients elevated Troponin of 1.06. Patients heart rate is stable in the 80's and 90's with no symptoms at this time. No changes in orders at this time. Will continue to monitor the patient. Bosie HelperS. Teleah Villamar, RN

## 2015-11-04 ENCOUNTER — Inpatient Hospital Stay (HOSPITAL_COMMUNITY): Payer: Medicare Other

## 2015-11-04 LAB — CBC WITH DIFFERENTIAL/PLATELET
BASOS ABS: 0 10*3/uL (ref 0.0–0.1)
Basophils Relative: 0 %
EOS ABS: 0.3 10*3/uL (ref 0.0–0.7)
Eosinophils Relative: 4 %
HCT: 34.1 % — ABNORMAL LOW (ref 39.0–52.0)
HEMOGLOBIN: 11.3 g/dL — AB (ref 13.0–17.0)
LYMPHS PCT: 32 %
Lymphs Abs: 2.2 10*3/uL (ref 0.7–4.0)
MCH: 32.6 pg (ref 26.0–34.0)
MCHC: 33.1 g/dL (ref 30.0–36.0)
MCV: 98.3 fL (ref 78.0–100.0)
Monocytes Absolute: 0.6 10*3/uL (ref 0.1–1.0)
Monocytes Relative: 9 %
NEUTROS ABS: 3.7 10*3/uL (ref 1.7–7.7)
NEUTROS PCT: 55 %
PLATELETS: 138 10*3/uL — AB (ref 150–400)
RBC: 3.47 MIL/uL — ABNORMAL LOW (ref 4.22–5.81)
RDW: 14.1 % (ref 11.5–15.5)
WBC: 6.8 10*3/uL (ref 4.0–10.5)

## 2015-11-04 LAB — GLUCOSE, CAPILLARY
GLUCOSE-CAPILLARY: 102 mg/dL — AB (ref 65–99)
GLUCOSE-CAPILLARY: 112 mg/dL — AB (ref 65–99)
GLUCOSE-CAPILLARY: 118 mg/dL — AB (ref 65–99)
GLUCOSE-CAPILLARY: 132 mg/dL — AB (ref 65–99)
Glucose-Capillary: 134 mg/dL — ABNORMAL HIGH (ref 65–99)
Glucose-Capillary: 170 mg/dL — ABNORMAL HIGH (ref 65–99)

## 2015-11-04 LAB — COMPREHENSIVE METABOLIC PANEL
ALBUMIN: 2.8 g/dL — AB (ref 3.5–5.0)
ALK PHOS: 50 U/L (ref 38–126)
ALT: 27 U/L (ref 17–63)
ANION GAP: 10 (ref 5–15)
AST: 32 U/L (ref 15–41)
BUN: 32 mg/dL — ABNORMAL HIGH (ref 6–20)
CALCIUM: 7.9 mg/dL — AB (ref 8.9–10.3)
CHLORIDE: 120 mmol/L — AB (ref 101–111)
CO2: 16 mmol/L — AB (ref 22–32)
Creatinine, Ser: 1.57 mg/dL — ABNORMAL HIGH (ref 0.61–1.24)
GFR calc Af Amer: 46 mL/min — ABNORMAL LOW (ref >60)
GFR calc non Af Amer: 40 mL/min — ABNORMAL LOW (ref >60)
GLUCOSE: 114 mg/dL — AB (ref 65–99)
Potassium: 3.8 mmol/L (ref 3.5–5.1)
SODIUM: 146 mmol/L — AB (ref 135–145)
Total Bilirubin: 0.8 mg/dL (ref 0.3–1.2)
Total Protein: 5.7 g/dL — ABNORMAL LOW (ref 6.5–8.1)

## 2015-11-04 LAB — URINE CULTURE
CULTURE: NO GROWTH
Special Requests: NORMAL

## 2015-11-04 LAB — TROPONIN I
TROPONIN I: 0.77 ng/mL — AB (ref <0.031)
Troponin I: 0.91 ng/mL (ref <0.031)

## 2015-11-04 LAB — MAGNESIUM: Magnesium: 1.4 mg/dL — ABNORMAL LOW (ref 1.7–2.4)

## 2015-11-04 LAB — TSH: TSH: 0.614 u[IU]/mL (ref 0.350–4.500)

## 2015-11-04 MED ORDER — SODIUM CHLORIDE 0.9 % IV SOLN
INTRAVENOUS | Status: DC
  Administered 2015-11-04 – 2015-11-05 (×2): via INTRAVENOUS

## 2015-11-04 MED ORDER — HYDRALAZINE HCL 20 MG/ML IJ SOLN
10.0000 mg | Freq: Four times a day (QID) | INTRAMUSCULAR | Status: DC | PRN
  Administered 2015-11-04 – 2015-11-05 (×3): 10 mg via INTRAVENOUS
  Filled 2015-11-04 (×3): qty 1

## 2015-11-04 MED ORDER — DEXTROSE-NACL 5-0.45 % IV SOLN: INTRAVENOUS | Status: DC

## 2015-11-04 MED ORDER — PERFLUTREN LIPID MICROSPHERE
INTRAVENOUS | Status: AC
  Filled 2015-11-04: qty 10

## 2015-11-04 MED ORDER — DONEPEZIL HCL 5 MG PO TABS
10.0000 mg | ORAL_TABLET | Freq: Every day | ORAL | Status: DC
  Administered 2015-11-04 – 2015-11-06 (×3): 10 mg via ORAL
  Filled 2015-11-04 (×3): qty 2

## 2015-11-04 MED ORDER — PERFLUTREN LIPID MICROSPHERE
1.0000 mL | INTRAVENOUS | Status: AC | PRN
  Administered 2015-11-04: 3 mL via INTRAVENOUS
  Filled 2015-11-04: qty 10

## 2015-11-04 MED ORDER — HYDRALAZINE HCL 10 MG PO TABS
10.0000 mg | ORAL_TABLET | Freq: Three times a day (TID) | ORAL | Status: DC
  Administered 2015-11-04 – 2015-11-07 (×9): 10 mg via ORAL
  Filled 2015-11-04 (×9): qty 1

## 2015-11-04 NOTE — Care Management Note (Addendum)
Case Management Note  Patient Details  Name: Travis Coleman MRN: [id number] Date of Birth: 28-Dec-1931  Subjective/Objective:                 nstemi with elevated tropoins   Action/Plan:Date: November 04, 2015 Chart reviewed for concurrent status and case management needs. Will continue to follow patient for changes and needs: Travis Smilinghonda Aquarius Tremper, RN, BSN, [place]   [phone]  Expected Discharge Date:   (UNKNOWN)               Expected Discharge Plan:  Skilled Nursing Facility  In-House Referral:  Clinical Social Work  Discharge planning Services  CM Consult  Post Acute Care Choice:  NA Choice offered to:  NA  DME Arranged:    DME Agency:     HH Arranged:    HH Agency:     Status of Service:  Completed, signed off  Medicare Important Message Given:    Date Medicare IM Given:    Medicare IM give by:    Date Additional Medicare IM Given:    Additional Medicare Important Message give by:     If discussed at Long Length of Stay Meetings, dates discussed:    Additional Comments:  Golda AcreDavis, Dawnelle Warman Lynn, RN 11/04/2015, 9:36 AM

## 2015-11-04 NOTE — NC FL2 (Signed)
[place] MEDICAID FL2 LEVEL OF CARE SCREENING TOOL     IDENTIFICATION  Patient Name: Travis Coleman Birthdate: Nov 15, 1932 Sex: male Admission Date (Current Location): 11/02/2015  [facility] [place]     Facility and Address:  [facility],  [address], [place] [id number]      Provider Number: [id number]  Attending Physician Name and Address:  Starleen Armsawood S Elgergawy, MD  Relative Name and Phone Number:       Current Level of Care: Hospital Recommended Level of Care: Memory Care Prior Approval Number:    Date Approved/Denied:   PASRR Number:    Discharge Plan: Other (Comment) (Memory Care Unit)    Current Diagnoses: Patient Active Problem List   Diagnosis Date Noted  . Infectious diarrhea in adult patient 11/03/2015  . AKI (acute kidney injury) (HCC) 11/03/2015  . Hypoglycemia 11/03/2015  . Hypernatremia 11/03/2015  . SIRS (systemic inflammatory response syndrome) (HCC) 11/03/2015  . Bronchospasm   . Sepsis, unspecified organism (HCC)   . Bradycardia   . Syncope 12/07/2013  . Fluid overload 12/05/2013  . Altered mental status 12/04/2013  . Hypertension 01/03/2011  . Diabetes mellitus type 2 with complications (HCC) 01/03/2011  . PVD (peripheral vascular disease) (HCC) 01/03/2011  . CAD (coronary artery disease) s/p CABG 01/03/2011  . Hyperlipidemia 01/03/2011  . Dementia 01/03/2011  . Person living in residential institution 01/03/2011    Orientation RESPIRATION BLADDER Height & Weight    Self  Normal Incontinent      BEHAVIORAL SYMPTOMS/MOOD NEUROLOGICAL BOWEL NUTRITION STATUS  Other (Comment) (No behaviors)   Incontinent Diet (soft diet)  AMBULATORY STATUS COMMUNICATION OF NEEDS Skin   Extensive Assist Verbally Normal                       Personal Care Assistance Level of Assistance  Bathing, Feeding, Dressing Bathing Assistance: Maximum assistance Feeding assistance: Limited assistance Dressing Assistance: Maximum  assistance     Functional Limitations Info  Sight, Hearing, Speech Sight Info: Adequate Hearing Info: Adequate Speech Info: Adequate    SPECIAL CARE FACTORS FREQUENCY                       Contractures Contractures Info: Present    Additional Factors Info  Code Status, Psychotropic Code Status Info: Full Code             Current Medications (11/04/2015):  This is the current hospital active medication list Current Facility-Administered Medications  Medication Dose Route Frequency Provider Last Rate Last Dose  . 0.9 %  sodium chloride infusion   Intravenous Continuous Starleen Armsawood S Elgergawy, MD 75 mL/hr at 11/04/15 1042    . acetaminophen (TYLENOL) tablet 1,000 mg  1,000 mg Oral Once Devoria AlbeIva Knapp, MD   1,000 mg at 11/03/15 0315  . albuterol (PROVENTIL) (2.5 MG/3ML) 0.083% nebulizer solution 2.5 mg  2.5 mg Nebulization Q6H PRN Rolly SalterPranav M Patel, MD      . aspirin EC tablet 81 mg  81 mg Oral Daily Hillary BowJared M Gardner, DO   81 mg at 11/04/15 1038  . atorvastatin (LIPITOR) tablet 20 mg  20 mg Oral QHS Hillary BowJared M Gardner, DO   20 mg at 11/03/15 2139  . divalproex (DEPAKOTE SPRINKLE) capsule 250 mg  250 mg Oral TID Hillary BowJared M Gardner, DO   250 mg at 11/04/15 1037  . donepezil (ARICEPT) tablet 10 mg  10 mg Oral QHS Orpah CobbAjay Kadakia, MD      .  ferrous sulfate tablet 325 mg  325 mg Oral Q breakfast Hillary Bow, DO   325 mg at 11/04/15 1037  . heparin injection 5,000 Units  5,000 Units Subcutaneous 3 times per day Hillary Bow, DO   5,000 Units at 11/04/15 1037  . hydrALAZINE (APRESOLINE) injection 10 mg  10 mg Intravenous Q6H PRN Starleen Arms, MD      . hydrALAZINE (APRESOLINE) tablet 10 mg  10 mg Oral TID Starleen Arms, MD   10 mg at 11/04/15 1220  . insulin aspart (novoLOG) injection 0-9 Units  0-9 Units Subcutaneous 6 times per day Hillary Bow, DO   1 Units at 11/04/15 1219     Discharge Medications: Please see discharge summary for a list of discharge  medications.  Relevant Imaging Results:  Relevant Lab Results:   Additional Information Enteric precautions 11/02/15. SS # [id number]  Aqeel Norgaard, Dickey Gave, LCSW

## 2015-11-04 NOTE — Consult Note (Addendum)
Ref: Florentina Jenny, MD   Subjective:  Sitting up in bed. No chest pain. Afebrile.  Monitor shows periodic 2nd degree heart block, type I.  Normal TSH.   Objective:  Vital Signs in the last 24 hours: Temp:  [97.6 F (36.4 C)-98.3 F (36.8 C)] 98 F (36.7 C) (12/16 0800) Pulse Rate:  [77-104] 99 (12/16 0626) Cardiac Rhythm:  [-] Bundle branch block;Normal sinus rhythm;Heart block (12/16 0800) Resp:  [18-30] 29 (12/16 0626) BP: (116-181)/(40-78) 179/58 mmHg (12/16 0626) SpO2:  [94 %-100 %] 97 % (12/16 0626) Weight:  [92.534 kg (204 lb)-99.4 kg (219 lb 2.2 oz)] 99.4 kg (219 lb 2.2 oz) (12/15 1543)  Physical Exam: BP Readings from Last 1 Encounters:  11/04/15 179/58    Wt Readings from Last 1 Encounters:  11/03/15 99.4 kg (219 lb 2.2 oz)    Weight change:   HEENT: Todd/AT, Eyes-Brown, PERL, EOMI, Conjunctiva-Pink, Sclera-Non-icteric Neck: No JVD, No bruit, Trachea midline. Lungs:  Clear, Bilateral. Cardiac:  Regular rhythm, normal S1 and S2, no S3.  Abdomen:  Soft, non-tender. Extremities:  No edema present. No cyanosis. No clubbing. CNS: AxOx3, Cranial nerves grossly intact, moves all 4 extremities. Right handed. Skin: Warm and dry.   Intake/Output from previous day: 12/15 0701 - 12/16 0700 In: 1932.6 [P.O.:120; I.V.:1814.6] Out: -     Lab Results: BMET    Component Value Date/Time   NA 146* 11/04/2015 0340   NA 141 11/03/2015 1024   NA 146* 11/02/2015 2316   K 3.8 11/04/2015 0340   K 3.7 11/03/2015 1024   K 4.2 11/02/2015 2316   CL 120* 11/04/2015 0340   CL 116* 11/03/2015 1024   CL 115* 11/02/2015 2316   CO2 16* 11/04/2015 0340   CO2 15* 11/03/2015 1024   CO2 18* 11/02/2015 2316   GLUCOSE 114* 11/04/2015 0340   GLUCOSE 173* 11/03/2015 1024   GLUCOSE 60* 11/02/2015 2316   BUN 32* 11/04/2015 0340   BUN 38* 11/03/2015 1024   BUN 49* 11/02/2015 2316   CREATININE 1.57* 11/04/2015 0340   CREATININE 1.63* 11/03/2015 1024   CREATININE 1.90* 11/02/2015 2316   CALCIUM 7.9* 11/04/2015 0340   CALCIUM 7.6* 11/03/2015 1024   CALCIUM 9.3 11/02/2015 2316   GFRNONAA 40* 11/04/2015 0340   GFRNONAA 38* 11/03/2015 1024   GFRNONAA 31* 11/02/2015 2316   GFRAA 46* 11/04/2015 0340   GFRAA 44* 11/03/2015 1024   GFRAA 36* 11/02/2015 2316   CBC    Component Value Date/Time   WBC 6.8 11/04/2015 0340   RBC 3.47* 11/04/2015 0340   HGB 11.3* 11/04/2015 0340   HCT 34.1* 11/04/2015 0340   PLT 138* 11/04/2015 0340   MCV 98.3 11/04/2015 0340   MCH 32.6 11/04/2015 0340   MCHC 33.1 11/04/2015 0340   RDW 14.1 11/04/2015 0340   LYMPHSABS 2.2 11/04/2015 0340   MONOABS 0.6 11/04/2015 0340   EOSABS 0.3 11/04/2015 0340   BASOSABS 0.0 11/04/2015 0340   HEPATIC Function Panel  Recent Labs  11/02/15 2316 11/03/15 1024 11/04/15 0340  PROT 7.5 5.8* 5.7*   HEMOGLOBIN A1C No components found for: HGA1C,  MPG CARDIAC ENZYMES Lab Results  Component Value Date   CKTOTAL 796* 12/26/2010   CKMB * 12/26/2010    10.9 CRITICAL RESULT CALLED TO, READ BACK BY AND VERIFIED WITH: B.RUDIN,RN 12/26/10 1054 BY BSLADE   TROPONINI 0.91* 11/04/2015   TROPONINI 1.06* 11/03/2015   TROPONINI <0.30 06/28/2014   BNP No results for input(s): PROBNP in the last 8760  hours. TSH  Recent Labs  11/03/15 0350  TSH 0.614   CHOLESTEROL No results for input(s): CHOL in the last 8760 hours.  Scheduled Meds: . acetaminophen  1,000 mg Oral Once  . aspirin EC  81 mg Oral Daily  . atorvastatin  20 mg Oral QHS  . divalproex  250 mg Oral TID  . ferrous sulfate  325 mg Oral Q breakfast  . heparin  5,000 Units Subcutaneous 3 times per day  . insulin aspart  0-9 Units Subcutaneous 6 times per day   Continuous Infusions: . sodium chloride 75 mL/hr at 11/04/15 1042   PRN Meds:.albuterol, hydrALAZINE  Assessment/Plan: Sinus rhythm with 2nd degree AV block, type I Chronic kidney disease, III DM, II Hypertension Dyslipidemia Abnormal Troponin-I possible small NSTEMI or demand  ischemia  Plan Check echocardiogram when available. Hold Namenda. Resume Aricept. Increase activity as tolerated. May transfer to telebed. Not a candidate for cardiac interventions for now.   LOS: 1 day    Orpah CobbAjay Ashawna Hanback  MD  11/04/2015, 11:12 AM

## 2015-11-04 NOTE — Progress Notes (Signed)
Patient Demographics  Travis Coleman, is a 81 y.o. male, DOB - 09/19/1932, ZOX:[id number]  Admit date - 11/02/2015   Admitting Physician Rolly Salter, MD  Outpatient Primary MD for the patient is Florentina Jenny, MD  LOS - 1   Chief Complaint  Patient presents with  . Diarrhea       Admission HPI/Brief narrative: 81 y.o. male with h/o DM, HLD, dementia, lives at [facility]. Patient presents with fever and diarrhea, skilled nursing facility where he presents from as massive outbreak of normal virus, patient was noticed to have episodes of skipped beats, the cardia heart rate went down to 28, maintenance symptomatic, as well noticed to have positive troponins, been seen by cardiology.  Subjective:   [name] today has, No headache, No chest pain, No abdominal pain - No Nausea, reports generalized weakness, had 5 episodes of diarrhea overnight., No Cough or SOB.   Assessment & Plan    Principal Problem:   Infectious diarrhea in adult patient Active Problems:   Hypertension   Diabetes mellitus type 2 with complications (HCC)   CAD (coronary artery disease) s/p CABG   Dementia   AKI (acute kidney injury) (HCC)   Hypoglycemia   Hypernatremia   SIRS (systemic inflammatory response syndrome) (HCC)   Bronchospasm   Sepsis, unspecified organism (HCC)   Bradycardia  Infectious diarrhea - This is most likely related to viral diarrhea has skilled nursing facility with no virus outbreak. - Continue with IV fluids for hydration, no indication for  antibiotics at this point - C. difficile negative - GI pathogen pending  Bradycardia with positive troponin - Cardiology input appreciated, asymptomatic, and history of bradycardia as an outpatient improved after beta blockers stopped in the outpatient setting as per cardiology. - Continue to hold Namenda and Aricept per cardiology recommendation. - Data  troponin most likely due to demand ischemia from infection , Troponins trending down, a 2-D echo pending.  Chronic kidney disease stage III - Appears at baseline, continue with IV fluids.  Hypernatremia - Most likely due to volume depletion from diarrhea, will increase IV NS rate, if no improvement on current rate may need to change to half-normal saline.  Hypertension - Uncontrolled , resume by mouth hydralazine, hold ACEi, will add when necessary hydralazine  Diabetes mellitus - Continue with insulin sliding scale  Hyperlipidemia - Continue with statin  Code Status: Full  Family Communication: None at bedside  Disposition Plan: Remains in stepdown   Procedures  None   Consults   Cardiology   Medications  Scheduled Meds: . acetaminophen  1,000 mg Oral Once  . aspirin EC  81 mg Oral Daily  . atorvastatin  20 mg Oral QHS  . divalproex  250 mg Oral TID  . ferrous sulfate  325 mg Oral Q breakfast  . heparin  5,000 Units Subcutaneous 3 times per day  . insulin aspart  0-9 Units Subcutaneous 6 times per day   Continuous Infusions: . dextrose 5 % and 0.45% NaCl     PRN Meds:.albuterol, perflutren lipid microspheres (DEFINITY) IV suspension  DVT Prophylaxis   Heparin   Lab Results  Component Value Date   PLT 138* 11/04/2015    Antibiotics  Anti-infectives    Start  Dose/Rate Route Frequency Ordered Stop   11/03/15 0515  vancomycin (VANCOCIN) IVPB 1000 mg/200 mL premix  Status:  Discontinued     1,000 mg 200 mL/hr over 60 Minutes Intravenous  Once 11/03/15 0507 11/03/15 0509   11/03/15 0515  piperacillin-tazobactam (ZOSYN) IVPB 3.375 g  Status:  Discontinued     3.375 g 12.5 mL/hr over 240 Minutes Intravenous  Once 11/03/15 0507 11/03/15 0509   11/03/15 0515  ciprofloxacin (CIPRO) IVPB 400 mg     400 mg 200 mL/hr over 60 Minutes Intravenous  Once 11/03/15 0510 11/03/15 0937   11/03/15 0515  metroNIDAZOLE (FLAGYL) IVPB 500 mg     500 mg 100 mL/hr over  60 Minutes Intravenous  Once 11/03/15 0510 11/03/15 0830          Objective:   Filed Vitals:   11/04/15 0400 11/04/15 0442 11/04/15 0626 11/04/15 0800  BP:  164/49 179/58   Pulse:  92 99   Temp: 97.9 F (36.6 C)   98 F (36.7 C)  TempSrc: Oral   Oral  Resp:  27 29   Height:      Weight:      SpO2:  97% 97%     Wt Readings from Last 3 Encounters:  11/03/15 99.4 kg (219 lb 2.2 oz)  12/09/13 98 kg (216 lb 0.8 oz)     Intake/Output Summary (Last 24 hours) at 11/04/15 0950 Last data filed at 11/04/15 0800  Gross per 24 hour  Intake 1984.58 ml  Output      0 ml  Net 1984.58 ml     Physical Exam  Awake Alert,  [place].AT,PERRAL Supple Neck,No JVD, .  Symmetrical Chest wall movement, Good air movement bilaterally, CTAB RRR,No Gallops,Rubs or new Murmurs, No Parasternal Heave +ve B.Sounds, Abd Soft, No tenderness, No organomegaly appriciated, No rebound - guarding or rigidity. No Cyanosis, Clubbing or edema, No new Rash or bruise     Data Review   Micro Results Recent Results (from the past 240 hour(s))  Urine culture     Status: None   Collection Time: 11/03/15 12:47 AM  Result Value Ref Range Status   Specimen Description URINE, CLEAN CATCH  Final   Special Requests Normal  Final   Culture   Final    NO GROWTH 1 DAY Performed at [facility]    Report Status 11/04/2015 FINAL  Final  C difficile quick scan w PCR reflex     Status: None   Collection Time: 11/03/15 12:49 AM  Result Value Ref Range Status   C Diff antigen NEGATIVE NEGATIVE Final   C Diff toxin NEGATIVE NEGATIVE Final   C Diff interpretation Negative for toxigenic C. difficile  Final  MRSA PCR Screening     Status: None   Collection Time: 11/03/15  3:30 PM  Result Value Ref Range Status   MRSA by PCR NEGATIVE NEGATIVE Final    Comment:        The GeneXpert MRSA Assay (FDA approved for NASAL specimens only), is one component of a comprehensive MRSA colonization surveillance program.  It is not intended to diagnose MRSA infection nor to guide or monitor treatment for MRSA infections.     Radiology Reports Ct Abdomen Pelvis Wo Contrast  11/03/2015  CLINICAL DATA:  Acute onset of diarrhea and generalized abdominal distention. Initial encounter. EXAM: CT ABDOMEN AND PELVIS WITHOUT CONTRAST TECHNIQUE: Multidetector CT imaging of the abdomen and pelvis was performed following the standard protocol without IV contrast. COMPARISON:  None. FINDINGS: Mild bibasilar atelectasis is noted. The patient is status post median sternotomy. Diffuse coronary artery calcifications are seen. The liver and spleen are unremarkable in appearance. The gallbladder is within normal limits. The pancreas and adrenal glands are unremarkable. Mild bilateral renal atrophy is noted. Mild perinephric stranding is noted bilaterally. There is no evidence of hydronephrosis. No renal or ureteral stones are seen. The small bowel is unremarkable in appearance. The stomach is within normal limits. No acute vascular abnormalities are seen. Diffuse calcification is noted along the abdominal aorta and its branches, including along the superior mesenteric artery, inferior mesenteric artery and at the proximal renal arteries bilaterally. The appendix is not definitely seen; there is no evidence for appendicitis. The colon is unremarkable in appearance. Trace fluid at the right lower quadrant is nonspecific. The bladder is mildly distended and grossly unremarkable. The patient is status post hysterectomy. No suspicious adnexal masses are seen. No inguinal lymphadenopathy is seen. No acute osseous abnormalities are identified. IMPRESSION: 1. No definite acute abnormality seen within the abdomen or pelvis. 2. Trace fluid at the right lower quadrant is nonspecific. 3. Diffuse calcification along the abdominal aorta and its branches, including along the superior mesenteric artery, inferior mesenteric artery and at the proximal renal  arteries bilaterally. 4. Mild bibasilar atelectasis noted. 5. Diffuse coronary artery calcifications seen. 6. Mild bilateral renal atrophy noted. Electronically Signed   By: Roanna RaiderJeffery  Chang M.D.   On: 11/03/2015 04:51   Dg Chest 2 View  11/02/2015  CLINICAL DATA:  Diarrhea.  Possible C difficile.  Dementia. EXAM: CHEST  2 VIEW COMPARISON:  06/28/2014 FINDINGS: Postoperative changes in the mediastinum. Shallow inspiration. Mild cardiac enlargement. No significant vascular congestion. No focal airspace disease or consolidation. No blunting of costophrenic angles. No pneumothorax. Mildly tortuous aorta. IMPRESSION: Cardiac enlargement.  No evidence of active pulmonary disease. Electronically Signed   By: Burman NievesWilliam  Stevens M.D.   On: 11/02/2015 23:54   Dg Chest Port 1 View  11/03/2015  CLINICAL DATA:  Acute shortness of breath and wheezing. EXAM: PORTABLE CHEST 1 VIEW COMPARISON:  11/02/2015 FINDINGS: Postoperative changes in the mediastinum. Shallow inspiration with atelectasis in the lung bases. No focal consolidation in the lungs. No blunting of costophrenic angles. No pneumothorax. Cardiac enlargement without vascular congestion or edema. IMPRESSION: Shallow inspiration with atelectasis in the lung bases. Cardiac enlargement. No evidence of active pulmonary disease. Electronically Signed   By: Burman NievesWilliam  Stevens M.D.   On: 11/03/2015 03:10     CBC  Recent Labs Lab 11/02/15 2316 11/03/15 1024 11/04/15 0340  WBC 4.7 6.7 6.8  HGB 13.2 12.6* 11.3*  HCT 40.8 39.5 34.1*  PLT 167 137* 138*  MCV 98.8 99.0 98.3  MCH 32.0 31.6 32.6  MCHC 32.4 31.9 33.1  RDW 14.1 14.2 14.1  LYMPHSABS  --  1.0 2.2  MONOABS  --  0.8 0.6  EOSABS  --  0.2 0.3  BASOSABS  --  0.0 0.0    Chemistries   Recent Labs Lab 11/02/15 2316 11/03/15 1024 11/04/15 0340  NA 146* 141 146*  K 4.2 3.7 3.8  CL 115* 116* 120*  CO2 18* 15* 16*  GLUCOSE 60* 173* 114*  BUN 49* 38* 32*  CREATININE 1.90* 1.63* 1.57*  CALCIUM 9.3  7.6* 7.9*  MG  --   --  1.4*  AST 25 26 32  ALT 32 26 27  ALKPHOS 66 49 50  BILITOT 0.6 0.7 0.8   ------------------------------------------------------------------------------------------------------------------ estimated creatinine clearance is 42.2  mL/min (by C-G formula based on Cr of 1.57). ------------------------------------------------------------------------------------------------------------------ No results for input(s): HGBA1C in the last 72 hours. ------------------------------------------------------------------------------------------------------------------ No results for input(s): CHOL, HDL, LDLCALC, TRIG, CHOLHDL, LDLDIRECT in the last 72 hours. ------------------------------------------------------------------------------------------------------------------  Recent Labs  11/03/15 0350  TSH 0.614   ------------------------------------------------------------------------------------------------------------------ No results for input(s): VITAMINB12, FOLATE, FERRITIN, TIBC, IRON, RETICCTPCT in the last 72 hours.  Coagulation profile No results for input(s): INR, PROTIME in the last 168 hours.  No results for input(s): DDIMER in the last 72 hours.  Cardiac Enzymes  Recent Labs Lab 11/03/15 2114 11/04/15 0340  TROPONINI 1.06* 0.91*   ------------------------------------------------------------------------------------------------------------------ Invalid input(s): POCBNP     Time Spent in minutes   30 minutes   Bellarae Lizer M.D on 11/04/2015 at 9:50 AM  Between 7am to 7pm - Pager - [phone]  After 7pm go to [email or web address] - password [facility]  [facility]   Office  [phone]

## 2015-11-04 NOTE — Progress Notes (Signed)
Echocardiogram 2D Echocardiogram has been performed.  Travis BasemanReel, Travis Coleman 11/04/2015, 12:11 PM

## 2015-11-05 DIAGNOSIS — N183 Chronic kidney disease, stage 3 (moderate): Secondary | ICD-10-CM

## 2015-11-05 DIAGNOSIS — A088 Other specified intestinal infections: Secondary | ICD-10-CM

## 2015-11-05 DIAGNOSIS — Z794 Long term (current) use of insulin: Secondary | ICD-10-CM

## 2015-11-05 DIAGNOSIS — A084 Viral intestinal infection, unspecified: Secondary | ICD-10-CM

## 2015-11-05 DIAGNOSIS — E1121 Type 2 diabetes mellitus with diabetic nephropathy: Secondary | ICD-10-CM

## 2015-11-05 DIAGNOSIS — N039 Chronic nephritic syndrome with unspecified morphologic changes: Secondary | ICD-10-CM

## 2015-11-05 DIAGNOSIS — E785 Hyperlipidemia, unspecified: Secondary | ICD-10-CM

## 2015-11-05 DIAGNOSIS — E1165 Type 2 diabetes mellitus with hyperglycemia: Secondary | ICD-10-CM

## 2015-11-05 DIAGNOSIS — I498 Other specified cardiac arrhythmias: Secondary | ICD-10-CM

## 2015-11-05 DIAGNOSIS — N182 Chronic kidney disease, stage 2 (mild): Secondary | ICD-10-CM

## 2015-11-05 DIAGNOSIS — D631 Anemia in chronic kidney disease: Secondary | ICD-10-CM

## 2015-11-05 DIAGNOSIS — R7989 Other specified abnormal findings of blood chemistry: Secondary | ICD-10-CM

## 2015-11-05 LAB — CBC
HCT: 33.4 % — ABNORMAL LOW (ref 39.0–52.0)
Hemoglobin: 11 g/dL — ABNORMAL LOW (ref 13.0–17.0)
MCH: 31.8 pg (ref 26.0–34.0)
MCHC: 32.9 g/dL (ref 30.0–36.0)
MCV: 96.5 fL (ref 78.0–100.0)
PLATELETS: 141 10*3/uL — AB (ref 150–400)
RBC: 3.46 MIL/uL — AB (ref 4.22–5.81)
RDW: 14.1 % (ref 11.5–15.5)
WBC: 7.6 10*3/uL (ref 4.0–10.5)

## 2015-11-05 LAB — BASIC METABOLIC PANEL
Anion gap: 10 (ref 5–15)
BUN: 21 mg/dL — AB (ref 6–20)
CO2: 14 mmol/L — ABNORMAL LOW (ref 22–32)
CREATININE: 1.18 mg/dL (ref 0.61–1.24)
Calcium: 7.9 mg/dL — ABNORMAL LOW (ref 8.9–10.3)
Chloride: 123 mmol/L — ABNORMAL HIGH (ref 101–111)
GFR, EST NON AFRICAN AMERICAN: 56 mL/min — AB (ref >60)
Glucose, Bld: 145 mg/dL — ABNORMAL HIGH (ref 65–99)
POTASSIUM: 4 mmol/L (ref 3.5–5.1)
SODIUM: 147 mmol/L — AB (ref 135–145)

## 2015-11-05 LAB — GLUCOSE, CAPILLARY
GLUCOSE-CAPILLARY: 127 mg/dL — AB (ref 65–99)
GLUCOSE-CAPILLARY: 171 mg/dL — AB (ref 65–99)
GLUCOSE-CAPILLARY: 217 mg/dL — AB (ref 65–99)
GLUCOSE-CAPILLARY: 161 mg/dL — AB (ref 65–99)
Glucose-Capillary: 127 mg/dL — ABNORMAL HIGH (ref 65–99)
Glucose-Capillary: 120 mg/dL — ABNORMAL HIGH (ref 65–99)

## 2015-11-05 LAB — MAGNESIUM: Magnesium: 1.5 mg/dL — ABNORMAL LOW (ref 1.7–2.4)

## 2015-11-05 LAB — TROPONIN I: TROPONIN I: 0.4 ng/mL — AB (ref <0.031)

## 2015-11-05 MED ORDER — MAGNESIUM SULFATE 2 GM/50ML IV SOLN
2.0000 g | Freq: Once | INTRAVENOUS | Status: AC
Start: 2015-11-05 — End: 2015-11-05
  Administered 2015-11-05: 2 g via INTRAVENOUS
  Filled 2015-11-05: qty 50

## 2015-11-05 NOTE — Progress Notes (Signed)
Patient ID: Travis Coleman, male   DOB: 02/20/1932, 81 y.o.   MRN: [id number] [facility] PROGRESS NOTE  Travis Coleman [id number] DOB: 02/20/1932 DOA: 11/02/2015 PCP: Florentina JennyRIPP, HENRY, MD  Brief narrative:    81 y.o. male with past medical history of diabetes, dyslipidemia, hypertension, dementia who [facility] with fever and diarrhea. His stool for C.diff was negative.He was found to have few skipped beats on telemetry, bradycardia and elevated troponin levels so Dr. Algie CofferKadakia has seen him in consultation.   Anticipated discharge: D/C likely by 11/07/2015.  Assessment/Plan:    Principal Problem: Diarrhea / Viral gastroenteritis - Stool for C.diff negative - Diarrhea down to 1 BM in past 24 hours - GI pathogen pending  Active Problems: Bradycardia with elevated troponin levels - Pt asymptomatic - Likely demand ischemia due to dehydration from GI losses as well as from chronic renal failure  - No further work up required per cardiology - Trop level stabilized and improved to 0.4 - Continue daily aspirin - 2 D ECHO on this admission with EF 55% with grade 1 diastolic dysfunction   Chronic kidney disease stage III - Cr normalized with fluids   Hypernatremia - Likely due to dehydration - Continue IV fluids - F/U BMP tomorrow am  Hypomagnesemia - Due to GI losses - Supplemented today - Check magnesium level in am   Anemia of chronic renal failure stage 3 - Hemoglobin stable   Hypertension, essential - Continue hydralazine 10 mg TID  Diabetes mellitus with diabetic nephropathy with lon term insulin use - Continue with insulin sliding scale for now - CBG's stable 120-127 range  Dyslipidemia associated with type 2 diabetes mellitus, goal LDL less tahn 70 - Continue lipitor 20 mg daily   Dementia with behavioral disturbance - Continue Aricept, Depakote  - Has mittens on   DVT Prophylaxis  - Continue heparin subQ  Code Status: Full.  Family  Communication:  Family not at the bedside this am Disposition Plan: 12/20 if continues to be stable and no diarrhea.   IV access:  Peripheral IV  Procedures and diagnostic studies:    Ct Abdomen Pelvis Wo Contrast 11/03/2015   1. No definite acute abnormality seen within the abdomen or pelvis. 2. Trace fluid at the right lower quadrant is nonspecific. 3. Diffuse calcification along the abdominal aorta and its branches, including along the superior mesenteric artery, inferior mesenteric artery and at the proximal renal arteries bilaterally. 4. Mild bibasilar atelectasis noted. 5. Diffuse coronary artery calcifications seen. 6. Mild bilateral renal atrophy noted.   Dg Chest 2 View 11/02/2015  Cardiac enlargement.  No evidence of active pulmonary disease. Electronically Signed   By: Burman NievesWilliam  Stevens M.D.   On: 11/02/2015 23:54   Dg Chest Port 1 View 11/03/2015  Shallow inspiration with atelectasis in the lung bases. Cardiac enlargement. No evidence of active pulmonary disease. Electronically Signed   By: Burman NievesWilliam  Stevens M.D.   On: 11/03/2015 03:10   Medical Consultants:  Cardiology, Dr. Orpah CobbAjay Kadakia   Other Consultants:  PT  IAnti-Infectives:   None    Manson PasseyEVINE, Ashland Osmer, MD  [facility] Pager [phone]  Time spent in minutes: 25 minutes  If 7PM-7AM, please contact night-coverage [email or web address] Password [email or web address] 11/05/2015, 7:59 AM   LOS: 2 days    HPI/Subjective: No acute overnight events. Patient reports no vomiting.  Objective: Filed Vitals:   11/04/15 1441 11/04/15 2105 11/05/15 0445 11/05/15 0531  BP: 177/50 155/61 170/51 163/42  Pulse: 68 108  88   Temp: 97.8 F (36.6 C) 98.2 F (36.8 C) 98.3 F (36.8 C)   TempSrc: Oral Oral Oral   Resp: Height:      Weight:      SpO2: 96% 98% 100%     Intake/Output Summary (Last 24 hours) at 11/05/15 0759 Last data filed at 11/05/15 0600  Gross per 24 hour  Intake 1762.5 ml  Output      0 ml  Net 1762.5 ml     Exam:   General:  Pt is alert, follows commands appropriately, not in acute distress  Cardiovascular: Regular rate and rhythm, S1/S2 appreciated   Respiratory: Clear to auscultation bilaterally, no wheezing, no crackles, no rhonchi  Abdomen: Soft, non tender, non distended, bowel sounds present  Extremities: No edema, pulses DP and PT palpable bilaterally  Neuro: Grossly nonfocal  Data Reviewed: Basic Metabolic Panel:  Recent Labs Lab 11/02/15 2316 11/03/15 1024 11/04/15 0340 11/05/15 0516  NA 146* 141 146* 147*  K 4.2 3.7 3.8 4.0  CL 115* 116* 120* 123*  CO2 18* 15* 16* 14*  GLUCOSE 60* 173* 114* 145*  BUN 49* 38* 32* 21*  CREATININE 1.90* 1.63* 1.57* 1.18  CALCIUM 9.3 7.6* 7.9* 7.9*  MG  --   --  1.4* 1.5*   Liver Function Tests:  Recent Labs Lab 11/02/15 2316 11/03/15 1024 11/04/15 0340  AST 25 26 32  ALT 32 26 27  ALKPHOS 66 49 50  BILITOT 0.6 0.7 0.8  PROT 7.5 5.8* 5.7*  ALBUMIN 4.0 3.0* 2.8*    Recent Labs Lab 11/02/15 2316  LIPASE 20   No results for input(s): AMMONIA in the last 168 hours. CBC:  Recent Labs Lab 11/02/15 2316 11/03/15 1024 11/04/15 0340 11/05/15 0516  WBC 4.7 6.7 6.8 7.6  NEUTROABS  --  4.6 3.7  --   HGB 13.2 12.6* 11.3* 11.0*  HCT 40.8 39.5 34.1* 33.4*  MCV 98.8 99.0 98.3 96.5  PLT 167 137* 138* 141*   Cardiac Enzymes:  Recent Labs Lab 11/03/15 2114 11/04/15 0340 11/04/15 1020 11/05/15 0516  TROPONINI 1.06* 0.91* 0.77* 0.40*   BNP: Invalid input(s): POCBNP CBG:  Recent Labs Lab 11/04/15 1722 11/04/15 2104 11/05/15 0022 11/05/15 0443 11/05/15 0752  GLUCAP 170* 132* 127* 127* 120*    Recent Results (from the past 240 hour(s))  Urine culture     Status: None   Collection Time: 11/03/15 12:47 AM  Result Value Ref Range Status   Specimen Description URINE, CLEAN CATCH  Final   Special Requests Normal  Final   Culture   Final    NO GROWTH 1 DAY Performed at [facility]    Report  Status 11/04/2015 FINAL  Final  C difficile quick scan w PCR reflex     Status: None   Collection Time: 11/03/15 12:49 AM  Result Value Ref Range Status   C Diff antigen NEGATIVE NEGATIVE Final   C Diff toxin NEGATIVE NEGATIVE Final   C Diff interpretation Negative for toxigenic C. difficile  Final  Culture, blood (routine x 2)     Status: None (Preliminary result)   Collection Time: 11/03/15  3:40 AM  Result Value Ref Range Status   Specimen Description BLOOD RIGHT ANTECUBITAL  Final   Special Requests BOTTLES DRAWN AEROBIC AND ANAEROBIC  Final   Culture   Final    NO GROWTH 1 DAY Performed at [facility]    Report Status  PENDING  Incomplete  Culture, blood (routine x 2)     Status: None (Preliminary result)   Collection Time: 11/03/15  4:00 AM  Result Value Ref Range Status   Specimen Description BLOOD BLOOD RIGHT HAND  Final   Special Requests BOTTLES DRAWN AEROBIC ONLY 6.5ML  Final   Culture   Final    NO GROWTH 1 DAY Performed at [facility]    Report Status PENDING  Incomplete  MRSA PCR Screening     Status: None   Collection Time: 11/03/15  3:30 PM  Result Value Ref Range Status   MRSA by PCR NEGATIVE NEGATIVE Final     Scheduled Meds: . acetaminophen  1,000 mg Oral Once  . aspirin EC  81 mg Oral Daily  . atorvastatin  20 mg Oral QHS  . divalproex  250 mg Oral TID  . donepezil  10 mg Oral QHS  . ferrous sulfate  325 mg Oral Q breakfast  . heparin  5,000 Units Subcutaneous 3 times per day  . hydrALAZINE  10 mg Oral TID  . insulin aspart  0-9 Units Subcutaneous 6 times per day   Continuous Infusions: . sodium chloride 75 mL/hr at 11/04/15 1042

## 2015-11-05 NOTE — Consult Note (Signed)
Ref: Florentina JennyRIPP, HENRY, MD   Subjective:  Maintaining sinus rhythm without pauses. Preserved LV systolic function with mild AS. Improving renal function.  Objective:  Vital Signs in the last 24 hours: Temp:  [97.8 F (36.6 C)-98.3 F (36.8 C)] 98.3 F (36.8 C) (12/17 0445) Pulse Rate:  [68-108] 88 (12/17 0445) Cardiac Rhythm:  [-] Normal sinus rhythm (12/17 0741) Resp:  [18-28] 18 (12/17 0445) BP: (155-186)/(42-61) 163/42 mmHg (12/17 0531) SpO2:  [96 %-100 %] 100 % (12/17 0445)  Physical Exam: BP Readings from Last 1 Encounters:  11/05/15 163/42    Wt Readings from Last 1 Encounters:  11/03/15 99.4 kg (219 lb 2.2 oz)    Weight change:   HEENT: [place]/AT, Eyes-Brown, PERL, EOMI, Conjunctiva-Pink, Sclera-Non-icteric Neck: No JVD, No bruit, Trachea midline. Lungs:  Clear, Bilateral. Cardiac:  Regular rhythm, normal S1 and S2, no S3.  Abdomen:  Soft, non-tender. Extremities:  No edema present. No cyanosis. No clubbing. CNS: AxOx3, Cranial nerves grossly intact, moves all 4 extremities. Right handed. Skin: Warm and dry.   Intake/Output from previous day: 12/16 0701 - 12/17 0700 In: 1762.5 [P.O.:265; I.V.:1497.5] Out: -     Lab Results: BMET    Component Value Date/Time   NA 147* 11/05/2015 0516   NA 146* 11/04/2015 0340   NA 141 11/03/2015 1024   K 4.0 11/05/2015 0516   K 3.8 11/04/2015 0340   K 3.7 11/03/2015 1024   CL 123* 11/05/2015 0516   CL 120* 11/04/2015 0340   CL 116* 11/03/2015 1024   CO2 14* 11/05/2015 0516   CO2 16* 11/04/2015 0340   CO2 15* 11/03/2015 1024   GLUCOSE 145* 11/05/2015 0516   GLUCOSE 114* 11/04/2015 0340   GLUCOSE 173* 11/03/2015 1024   BUN 21* 11/05/2015 0516   BUN 32* 11/04/2015 0340   BUN 38* 11/03/2015 1024   CREATININE 1.18 11/05/2015 0516   CREATININE 1.57* 11/04/2015 0340   CREATININE 1.63* 11/03/2015 1024   CALCIUM 7.9* 11/05/2015 0516   CALCIUM 7.9* 11/04/2015 0340   CALCIUM 7.6* 11/03/2015 1024   GFRNONAA 56* 11/05/2015 0516    GFRNONAA 40* 11/04/2015 0340   GFRNONAA 38* 11/03/2015 1024   GFRAA >60 11/05/2015 0516   GFRAA 46* 11/04/2015 0340   GFRAA 44* 11/03/2015 1024   CBC    Component Value Date/Time   WBC 7.6 11/05/2015 0516   RBC 3.46* 11/05/2015 0516   HGB 11.0* 11/05/2015 0516   HCT 33.4* 11/05/2015 0516   PLT 141* 11/05/2015 0516   MCV 96.5 11/05/2015 0516   MCH 31.8 11/05/2015 0516   MCHC 32.9 11/05/2015 0516   RDW 14.1 11/05/2015 0516   LYMPHSABS 2.2 11/04/2015 0340   MONOABS 0.6 11/04/2015 0340   EOSABS 0.3 11/04/2015 0340   BASOSABS 0.0 11/04/2015 0340   HEPATIC Function Panel  Recent Labs  11/02/15 2316 11/03/15 1024 11/04/15 0340  PROT 7.5 5.8* 5.7*   HEMOGLOBIN A1C No components found for: HGA1C,  MPG CARDIAC ENZYMES Lab Results  Component Value Date   CKTOTAL 796* 12/26/2010   CKMB * 12/26/2010    10.9 CRITICAL RESULT CALLED TO, READ BACK BY AND VERIFIED WITH: B.RUDIN,RN 12/26/10 1054 BY BSLADE   TROPONINI 0.40* 11/05/2015   TROPONINI 0.77* 11/04/2015   TROPONINI 0.91* 11/04/2015   BNP No results for input(s): PROBNP in the last 8760 hours. TSH  Recent Labs  11/03/15 0350  TSH 0.614   CHOLESTEROL No results for input(s): CHOL in the last 8760 hours.  Scheduled Meds: .  acetaminophen  1,000 mg Oral Once  . aspirin EC  81 mg Oral Daily  . atorvastatin  20 mg Oral QHS  . divalproex  250 mg Oral TID  . donepezil  10 mg Oral QHS  . ferrous sulfate  325 mg Oral Q breakfast  . heparin  5,000 Units Subcutaneous 3 times per day  . hydrALAZINE  10 mg Oral TID  . insulin aspart  0-9 Units Subcutaneous 6 times per day   Continuous Infusions: . sodium chloride 75 mL/hr at 11/05/15 0849   PRN Meds:.albuterol, hydrALAZINE  Assessment/Plan: Improving AV block Chronic kidney disease, II Mild aortic stenosis DM, II Hypertension Dyslipidemia Abnormal Troponin-I possible small NSTEMI or demand ischemia   Continue medical treatment. Increase activity as  tolerated. Re-consult as needed.   LOS: 2 days    [name]  MD  11/05/2015, 12:12 PM

## 2015-11-05 NOTE — Clinical Social Work Note (Signed)
Clinical Social Work Assessment  Patient Details  Name: Travis Coleman MRN: [id number] Date of Birth: 10-10-1932  Date of referral:  11/05/15               Reason for consult:  Discharge Planning                Permission sought to share information with:  Family Supports Permission granted to share information::     Name::     Travis Coleman  Agency::     Relationship::  sister  Contact Information:  [phone] (home) or [phone] (cell)   Housing/Transportation Living arrangements for the past 2 months:  Assisted Living Facility Source of Information:  Other (Comment Required) (sister) Patient Interpreter Needed:  None Criminal Activity/Legal Involvement Pertinent to Current Situation/Hospitalization:  No - Comment as needed Significant Relationships:  Siblings Lives with:  Facility Resident Do you feel safe going back to the place where you live?  Yes Need for family participation in patient care:  Yes (Comment)  Care giving concerns:  Pt admitted from [facility]F memory care unit. Pt sister reports that pt has been a the facility since 2012.   Social Worker assessment / plan:  CSW received referral that pt admitted from [facility] memory care unit.   CSW contacted pt sister, Travis Coleman  [phone] via telephone to complete assessment.  CSW provided support as pt sister expressed frustration that [facility]F did not notify pt sister of pt admission to the hospital.   Pt sister confirmed that pt is a resident at [facility] and lives in the memory care unit. She states that the patient has been a resident at the facility since February, 2012.  Sister states that this is the first falling incident for patient within 6 months. Sisters states that the patient receives help with ADL's while at facility.  Pt Sister says that she is the patient's primary support and that she also lives in [place]. Pt sister hopeful that pt  can return to [facility] memory care unit as pt is familiar with facility. CSW discussed that CSW will update facility and that if facility feels they cannot meet pt needs then may have to explore SNF options for higher level of care. Pt sister expressed understanding.  CSW completed FL2 and asked MD to consult PT for evaluation. Clinicals sent to [facility] memory care unit.   CSW to continue to follow to provide support to assist with disposition needs.   Employment status:  Retired Health and safety inspector:  Medicare PT Recommendations:  Not assessed at this time Information / Referral to community resources:  Other (Comment Required) (Information sent to [facility])  Patient/Family's Response to care:  Pt oriented to person only. Pt sister pt primary support. Pt sister hopeful that pt can return to [facility]F memory care unit.   Patient/Family's Understanding of and Emotional Response to Diagnosis, Current Treatment, and Prognosis: Pt sister had not been notified of pt admission by pt ALF and has not yet been able to speak to RN or MD. CSW provided contact information to pt sister for pt sister to contact in order to get update.   Emotional Assessment Appearance:  Appears stated age Attitude/Demeanor/Rapport:  Unable to Assess Affect (typically observed):  Unable to Assess Orientation:  Oriented to Self Alcohol / Substance use:  Not Applicable Psych involvement (Current and /or in the community):  No (Comment)  Discharge Needs  Concerns to be addressed:  Discharge Planning Concerns Readmission within the last 30 days:  No Current discharge risk:  None Barriers to Discharge:  Continued Medical Work up   Orson EvaKIDD, SUZANNA A, LCSW 11/05/2015, 12:17 PM Weekend coverage

## 2015-11-06 DIAGNOSIS — R197 Diarrhea, unspecified: Secondary | ICD-10-CM

## 2015-11-06 LAB — BASIC METABOLIC PANEL WITH GFR
Anion gap: 7 (ref 5–15)
BUN: 17 mg/dL (ref 6–20)
CO2: 19 mmol/L — ABNORMAL LOW (ref 22–32)
Calcium: 8.2 mg/dL — ABNORMAL LOW (ref 8.9–10.3)
Chloride: 125 mmol/L — ABNORMAL HIGH (ref 101–111)
Creatinine, Ser: 1.05 mg/dL (ref 0.61–1.24)
GFR calc Af Amer: >60 mL/min
GFR calc non Af Amer: >60 mL/min
Glucose, Bld: 161 mg/dL — ABNORMAL HIGH (ref 65–99)
Potassium: 3.6 mmol/L (ref 3.5–5.1)
Sodium: 151 mmol/L — ABNORMAL HIGH (ref 135–145)

## 2015-11-06 LAB — CBC
HEMATOCRIT: 33.7 % — AB (ref 39.0–52.0)
HEMOGLOBIN: 11 g/dL — AB (ref 13.0–17.0)
MCH: 31.5 pg (ref 26.0–34.0)
MCHC: 32.6 g/dL (ref 30.0–36.0)
MCV: 96.6 fL (ref 78.0–100.0)
Platelets: 149 10*3/uL — ABNORMAL LOW (ref 150–400)
RBC: 3.49 MIL/uL — AB (ref 4.22–5.81)
RDW: 14.5 % (ref 11.5–15.5)
WBC: 8.5 10*3/uL (ref 4.0–10.5)

## 2015-11-06 LAB — GLUCOSE, CAPILLARY
GLUCOSE-CAPILLARY: 180 mg/dL — AB (ref 65–99)
Glucose-Capillary: 146 mg/dL — ABNORMAL HIGH (ref 65–99)
Glucose-Capillary: 150 mg/dL — ABNORMAL HIGH (ref 65–99)
Glucose-Capillary: 199 mg/dL — ABNORMAL HIGH (ref 65–99)
Glucose-Capillary: 203 mg/dL — ABNORMAL HIGH (ref 65–99)

## 2015-11-06 LAB — MAGNESIUM: MAGNESIUM: 1.9 mg/dL (ref 1.7–2.4)

## 2015-11-06 MED ORDER — POTASSIUM CHLORIDE ER 10 MEQ PO TBCR
10.0000 meq | EXTENDED_RELEASE_TABLET | Freq: Once | ORAL | Status: AC
  Administered 2015-11-06: 10 meq via ORAL
  Filled 2015-11-06 (×2): qty 1

## 2015-11-06 NOTE — Progress Notes (Addendum)
CSW aware of PT recommendation for SNF- CSW faxed PT eval to ALF for review.  CSW spoke with pt sister concerning recommendation- if ALF can not accept pt back then sister is agreeable to short term SNF- prefers [facility] where pt has been before.  CSW did NOT submit for SNF level PASAR since uncertain of DC disposition at this time.  CSW will continue to follow  Merlyn LotJenna Holoman, [facility] SurgicenterCSWA Clinical Social Worker [phone]

## 2015-11-06 NOTE — Progress Notes (Signed)
Patient ID: Travis Coleman, male   DOB: 1932-09-05, 81 y.o.   MRN: [id number] [facility] PROGRESS NOTE  Travis Coleman [id number] DOB: 1932-09-05 DOA: 11/02/2015 PCP: Florentina JennyRIPP, HENRY, MD  Brief narrative:    81 y.o. male with past medical history of diabetes, dyslipidemia, hypertension, dementia who [facility] with fever and diarrhea. His stool for C.diff was negative.He was found to have few skipped beats on telemetry, bradycardia and elevated troponin levels so Dr. Algie CofferKadakia has seen him in consultation.   Anticipated discharge: D/C 11/07/2015 to SNF.  Assessment/Plan:    Principal Problem: Diarrhea / Viral gastroenteritis - Stool for C.diff negative - Had 4 BM in past 24 hours - GI pathogen panel not yet back - Blood cultures negative so far   Active Problems: Bradycardia with elevated troponin levels - Likely demand ischemia from chronic renal failure  - Trop level stabilized and improved to 0.4 - No further work up required per cardiology - Continue daily aspirin - 2 D ECHO on this admission with EF 55% with grade 1 diastolic dysfunction   Chronic kidney disease stage III - Cr improved with fluids   Hypernatremia - Likely due to dehydration - Continue hydration - F/U BMP in am   Hypomagnesemia - Due to GI losses - Supplemented - Magnesium level now WNL  Anemia of chronic renal failure stage 3 - Hemoglobin stable at 11 - No reports of bleeding   Thrombocytopenia - Likely from heparin so will stop subQ heparin and use SCD's for DVT prophylaxis   Hypertension, essential - Continue hydralazine 10 mg TID  Controlled diabetes mellitus with diabetic nephropathy with long term insulin use - Continue sliding scale insulin  - CBG's in past 24 hours: 161, 146, 150  Dyslipidemia associated with type 2 diabetes mellitus, goal LDL less tahn 70 - Continue lipitor 20 mg daily   Dementia with behavioral disturbance - Continue Aricept, Depakote  - No  significant changes in mental status    DVT Prophylaxis  - Stop hep subQ and use SCD's for DVT prophylaxis   Code Status: Full.  Family Communication:  Family not at the bedside this am Disposition Plan: 12/19 to SNF.   IV access:  Peripheral IV  Procedures and diagnostic studies:    Ct Abdomen Pelvis Wo Contrast 11/03/2015   1. No definite acute abnormality seen within the abdomen or pelvis. 2. Trace fluid at the right lower quadrant is nonspecific. 3. Diffuse calcification along the abdominal aorta and its branches, including along the superior mesenteric artery, inferior mesenteric artery and at the proximal renal arteries bilaterally. 4. Mild bibasilar atelectasis noted. 5. Diffuse coronary artery calcifications seen. 6. Mild bilateral renal atrophy noted.   Dg Chest 2 View 11/02/2015  Cardiac enlargement.  No evidence of active pulmonary disease. Electronically Signed   By: Burman NievesWilliam  Stevens M.D.   On: 11/02/2015 23:54   Dg Chest Port 1 View 11/03/2015  Shallow inspiration with atelectasis in the lung bases. Cardiac enlargement. No evidence of active pulmonary disease. Electronically Signed   By: Burman NievesWilliam  Stevens M.D.   On: 11/03/2015 03:10   Medical Consultants:  Cardiology, Dr. Orpah CobbAjay Kadakia   Other Consultants:  PT  IAnti-Infectives:   None    Manson PasseyEVINE, Jemmie Ledgerwood, MD  [facility] Pager [phone]  Time spent in minutes: 15 minutes  If 7PM-7AM, please contact night-coverage [email or web address] Password [email or web address] 11/06/2015, 11:09 AM   LOS: 3 days    HPI/Subjective: No acute overnight events. No vomiting,  no respiratory distress.   Objective: Filed Vitals:   11/05/15 2122 11/05/15 2300 11/06/15 0552 11/06/15 0620  BP: 152/80 167/63 181/62 158/60  Pulse:  78 82   Temp:  98.3 F (36.8 C) 97.5 F (36.4 C)   TempSrc:  Oral Oral   Resp:  18 20   Height:      Weight:      SpO2:  97% 98%     Intake/Output Summary (Last 24 hours) at 11/06/15 1109 Last data filed at  11/06/15 0900  Gross per 24 hour  Intake   2275 ml  Output    800 ml  Net   1475 ml    Exam:   General:  Pt is not in acute distress  Cardiovascular: RRR, S1/S2 (+)  Respiratory: No wheezing, no crackles, no rhonchi  Abdomen: (+) BS, non tender   Extremities: No leg swelling, palpable pulses   Neuro: Nonfocal  Data Reviewed: Basic Metabolic Panel:  Recent Labs Lab 11/02/15 2316 11/03/15 1024 11/04/15 0340 11/05/15 0516 11/06/15 0518  NA 146* 141 146* 147* 151*  K 4.2 3.7 3.8 4.0 3.6  CL 115* 116* 120* 123* 125*  CO2 18* 15* 16* 14* 19*  GLUCOSE 60* 173* 114* 145* 161*  BUN 49* 38* 32* 21* 17  CREATININE 1.90* 1.63* 1.57* 1.18 1.05  CALCIUM 9.3 7.6* 7.9* 7.9* 8.2*  MG  --   --  1.4* 1.5* 1.9   Liver Function Tests:  Recent Labs Lab 11/02/15 2316 11/03/15 1024 11/04/15 0340  AST 25 26 32  ALT 32 26 27  ALKPHOS 66 49 50  BILITOT 0.6 0.7 0.8  PROT 7.5 5.8* 5.7*  ALBUMIN 4.0 3.0* 2.8*    Recent Labs Lab 11/02/15 2316  LIPASE 20   No results for input(s): AMMONIA in the last 168 hours. CBC:  Recent Labs Lab 11/02/15 2316 11/03/15 1024 11/04/15 0340 11/05/15 0516 11/06/15 0518  WBC 4.7 6.7 6.8 7.6 8.5  NEUTROABS  --  4.6 3.7  --   --   HGB 13.2 12.6* 11.3* 11.0* 11.0*  HCT 40.8 39.5 34.1* 33.4* 33.7*  MCV 98.8 99.0 98.3 96.5 96.6  PLT 167 137* 138* 141* 149*   Cardiac Enzymes:  Recent Labs Lab 11/03/15 2114 11/04/15 0340 11/04/15 1020 11/05/15 0516  TROPONINI 1.06* 0.91* 0.77* 0.40*   BNP: Invalid input(s): POCBNP CBG:  Recent Labs Lab 11/05/15 1248 11/05/15 1650 11/05/15 1948 11/05/15 2356 11/06/15 0737  GLUCAP 171* 217* 161* 146* 150*   Results for orders placed or performed during the hospital encounter of 11/02/15  Urine culture     Status: None   Collection Time: 11/03/15 12:47 AM  Result Value Ref Range Status   Specimen Description URINE, CLEAN CATCH  Final   Special Requests Normal  Final   Culture   Final     NO GROWTH 1 DAY Performed at [facility]    Report Status 11/04/2015 FINAL  Final  C difficile quick scan w PCR reflex     Status: None   Collection Time: 11/03/15 12:49 AM  Result Value Ref Range Status   C Diff antigen NEGATIVE NEGATIVE Final   C Diff toxin NEGATIVE NEGATIVE Final   C Diff interpretation Negative for toxigenic C. difficile  Final  Culture, blood (routine x 2)     Status: None (Preliminary result)   Collection Time: 11/03/15  3:40 AM  Result Value Ref Range Status   Specimen Description BLOOD RIGHT ANTECUBITAL  Final  Special Requests BOTTLES DRAWN AEROBIC AND ANAEROBIC  Final   Culture   Final    NO GROWTH 3 DAYS Performed at [facility]    Report Status PENDING  Incomplete  Culture, blood (routine x 2)     Status: None (Preliminary result)   Collection Time: 11/03/15  4:00 AM  Result Value Ref Range Status   Specimen Description BLOOD BLOOD RIGHT HAND  Final   Special Requests BOTTLES DRAWN AEROBIC ONLY 6.5ML  Final   Culture   Final    NO GROWTH 3 DAYS Performed at [facility]    Report Status PENDING  Incomplete  MRSA PCR Screening     Status: None   Collection Time: 11/03/15  3:30 PM  Result Value Ref Range Status   MRSA by PCR NEGATIVE NEGATIVE Final    Comment:        The GeneXpert MRSA Assay (FDA approved for NASAL specimens only), is one component of a comprehensive MRSA colonization surveillance program. It is not intended to diagnose MRSA infection nor to guide or monitor treatment for MRSA infections.        Scheduled Meds: . acetaminophen  1,000 mg Oral Once  . aspirin EC  81 mg Oral Daily  . atorvastatin  20 mg Oral QHS  . divalproex  250 mg Oral TID  . donepezil  10 mg Oral QHS  . ferrous sulfate  325 mg Oral Q breakfast  . heparin  5,000 Units Subcutaneous 3 times per day  . hydrALAZINE  10 mg Oral TID  . insulin aspart  0-9 Units Subcutaneous 6 times per day  . potassium chloride  10 mEq Oral  Once   Continuous Infusions: . sodium chloride 75 mL/hr at 11/05/15 [phone]

## 2015-11-06 NOTE — Progress Notes (Signed)
2 soft, green incont stools in past 13 hours

## 2015-11-06 NOTE — Clinical Social Work Placement (Signed)
   CLINICAL SOCIAL WORK PLACEMENT  NOTE  Date:  11/06/2015  Patient Details  Name: Wayna ChaletDonald C Portocarrero MRN: [id number] Date of Birth: 1932-10-14  Clinical Social Work is seeking post-discharge placement for this patient at the Skilled  Nursing Facility level of care (*CSW will initial, date and re-position this form in  chart as items are completed):  Yes   Patient/family provided with [facility] Clinical Social Work Department's list of facilities offering this level of care within the geographic area requested by the patient (or if unable, by the patient's family).  Yes   Patient/family informed of their freedom to choose among providers that offer the needed level of care, that participate in Medicare, Medicaid or managed care program needed by the patient, have an available bed and are willing to accept the patient.  Yes   Patient/family informed of [facility] ownership interest [facility] and [facility], as well as of the fact that they are under no obligation to receive care at these facilities.  PASRR submitted to EDS on       PASRR number received on       Existing PASRR number confirmed on       FL2 transmitted to all facilities in geographic area requested by pt/family on 11/06/15     FL2 transmitted to all facilities within larger geographic area on       Patient informed that his/her managed care company has contracts with or will negotiate with certain facilities, including the following:            Patient/family informed of bed offers received.  Patient chooses bed at       Physician recommends and patient chooses bed at      Patient to be transferred to   on  .  Patient to be transferred to facility by       Patient family notified on   of transfer.  Name of family member notified:        PHYSICIAN Please sign FL2     Additional Comment:    _______________________________________________ Izora RibasHoloman, Saroya Riccobono M, LCSW 11/06/2015, 12:56 PM

## 2015-11-06 NOTE — Evaluation (Signed)
Physical Therapy Evaluation Patient Details Name: Travis Coleman MRN: [id number] DOB: Apr 04, 1932 Today's Date: 11/06/2015   History of Present Illness  81 yo male admitted with infectious diarrhea, 2* AV block, type I. Hx of DM,CAD,CABD, Alz dementia, PVD. Pt is from ALF  Clinical Impression  On eval, pt required Mod assist for mobility-stood at EOB x3 with RW. Pt only able to stand for ~10-15 seconds each attempt before having to sit back down. Moderate-heavy posterior lean in standing. Unable to safely attempt ambulation on today. Recommend ST rehab at SNF unless ALF feels they can provide current level of assist.     Follow Up Recommendations SNF;Supervision/Assistance - 24 hour    Equipment Recommendations  None recommended by PT    Recommendations for Other Services       Precautions / Restrictions Precautions Precautions: Fall Precaution Comments: enteric prec Restrictions Weight Bearing Restrictions: No      Mobility  Bed Mobility Overal bed mobility: Needs Assistance Bed Mobility: Supine to Sit;Sit to Supine;Rolling Rolling: Min guard   Supine to sit: Mod assist Sit to supine: Min assist   General bed mobility comments: Assist for trunk and bil LEs. Increased time.   Transfers Overall transfer level: Needs assistance Equipment used: Rolling walker (2 wheeled) Transfers: Sit to/from Stand Sit to Stand: From elevated surface;Mod assist         General transfer comment: x3. Assist to rise, stabilize, control descent. Pt with heave posterior lean. Only able to stand for ~10-15 seconds each attempt  Ambulation/Gait             General Gait Details: NT-pt unable at this time  Stairs            Wheelchair Mobility    Modified Rankin (Stroke Patients Only)       Balance Overall balance assessment: Needs assistance Sitting-balance support: Feet unsupported;Bilateral upper extremity supported Sitting balance-Leahy Scale: Good   Postural  control: Posterior lean Standing balance support: During functional activity;Bilateral upper extremity supported Standing balance-Leahy Scale: Poor                               Pertinent Vitals/Pain Pain Assessment: Faces Faces Pain Scale: Hurts little more Pain Location: R lower leg/calf area Pain Descriptors / Indicators: Sore Pain Intervention(s): Limited activity within patient's tolerance;Repositioned;Monitored during session    Home Living Family/patient expects to be discharged to:: Unsure                      Prior Function Level of Independence: Needs assistance   Gait / Transfers Assistance Needed: pt stated he doesn't do much walking? unsure of PLOF at ALF           Hand Dominance        Extremity/Trunk Assessment   Upper Extremity Assessment: Generalized weakness           Lower Extremity Assessment: Generalized weakness      Cervical / Trunk Assessment: Kyphotic  Communication   Communication: HOH  Cognition Arousal/Alertness: Awake/alert Behavior During Therapy: WFL for tasks assessed/performed Overall Cognitive Status: History of cognitive impairments - at baseline                      General Comments      Exercises        Assessment/Plan    PT Assessment Patient needs continued PT services  PT Diagnosis Difficulty  walking;Generalized weakness   PT Problem List Decreased strength;Decreased activity tolerance;Decreased balance;Decreased mobility;Decreased knowledge of use of DME;Decreased cognition;Pain  PT Treatment Interventions DME instruction;Gait training;Functional mobility training;Therapeutic activities;Patient/family education;Balance training;Therapeutic exercise   PT Goals (Current goals can be found in the Care Plan section) Acute Rehab PT Goals Patient Stated Goal: none stated PT Goal Formulation: Patient unable to participate in goal setting Time For Goal Achievement: 11/20/15 Potential to  Achieve Goals: Fair    Frequency Min 3X/week   Barriers to discharge        Co-evaluation               End of Session Equipment Utilized During Treatment: Gait belt Activity Tolerance: Patient limited by fatigue;Patient limited by pain Patient left: in bed;with call bell/phone within reach;with restraints reapplied (bed alarm not set at start of session. Unable to set at end of session. Bed not functioning properly. All side rails placed up for safety)           Time: 0981-1914 PT Time Calculation (min) (ACUTE ONLY): 27 min   Charges:   PT Evaluation $Initial PT Evaluation Tier I: 1 Procedure PT Treatments $Therapeutic Activity: 8-22 mins   PT G Codes:        Rebeca Alert, MPT Pager: [phone]

## 2015-11-07 DIAGNOSIS — D696 Thrombocytopenia, unspecified: Secondary | ICD-10-CM

## 2015-11-07 LAB — GLUCOSE, CAPILLARY
Glucose-Capillary: 161 mg/dL — ABNORMAL HIGH (ref 65–99)
Glucose-Capillary: 186 mg/dL — ABNORMAL HIGH (ref 65–99)

## 2015-11-07 LAB — GI PATHOGEN PANEL BY PCR, STOOL
C difficile toxin A/B: NOT DETECTED
CAMPYLOBACTER BY PCR: NOT DETECTED
Cryptosporidium by PCR: NOT DETECTED
E COLI (ETEC) LT/ST: NOT DETECTED
E COLI (STEC): NOT DETECTED
E COLI 0157 BY PCR: NOT DETECTED
G lamblia by PCR: NOT DETECTED
Norovirus GI/GII: NOT DETECTED
Rotavirus A by PCR: NOT DETECTED
SALMONELLA BY PCR: NOT DETECTED
SHIGELLA BY PCR: NOT DETECTED

## 2015-11-07 MED ORDER — ALBUTEROL SULFATE (2.5 MG/3ML) 0.083% IN NEBU: 2.5000 mg | INHALATION_SOLUTION | Freq: Four times a day (QID) | RESPIRATORY_TRACT | Status: DC | PRN

## 2015-11-07 MED ORDER — ACETAMINOPHEN 500 MG PO TABS: 500.0000 mg | ORAL_TABLET | Freq: Four times a day (QID) | ORAL | Status: DC | PRN

## 2015-11-07 MED ORDER — INSULIN ASPART 100 UNIT/ML FLEXPEN: 5.0000 [IU] | PEN_INJECTOR | Freq: Three times a day (TID) | SUBCUTANEOUS | Status: DC

## 2015-11-07 MED ORDER — INSULIN ASPART 100 UNIT/ML ~~LOC~~ SOLN: 0.0000 [IU] | SUBCUTANEOUS | Status: DC

## 2015-11-07 NOTE — Discharge Instructions (Signed)
Enalapril tablets What is this medicine? ENALAPRIL (e NAL a pril) is an ACE inhibitor. This medicine is used to treat high blood pressure and heart failure. This medicine may be used for other purposes; ask your health care provider or pharmacist if you have questions. What should I tell my health care provider before I take this medicine? They need to know if you have any of these conditions: -bone marrow disease -heart or blood vessel disease -if you are on a special diet, such as a low salt diet -immune system disease like lupus -kidney or liver disease -low blood pressure -previous swelling of the tongue, face, or lips with difficulty breathing, difficulty swallowing, hoarseness, or tightening of the throat -an unusual or allergic reaction to enalapril, other ACE inhibitors, insect venom, foods, dyes, or preservatives -pregnant or trying to get pregnant -breast-feeding How should I use this medicine? Take this medicine by mouth with a glass of water. Follow the directions on the prescription label. Take your doses at regular intervals. Do not take your medicine more often than directed. Do not stop taking this medicine except on the advice of your doctor or health care professional. Talk to your pediatrician regarding the use of this medicine in children. Special care may be needed. While this drug may be prescribed for children as young as 1 month, precautions do apply. Overdosage: If you think you have taken too much of this medicine contact a poison control center or emergency room at once. NOTE: This medicine is only for you. Do not share this medicine with others. What if I miss a dose? If you miss a dose, take it as soon as you can. If it is almost time for your next dose, take only that dose. Do not take double or extra doses. What may interact with this medicine? -diuretics -lithium -medicines for high blood pressure -NSAIDs, medicines for pain and inflammation, like ibuprofen  or naproxen -potassium salts or potassium supplements This list may not describe all possible interactions. Give your health care provider a list of all the medicines, herbs, non-prescription drugs, or dietary supplements you use. Also tell them if you smoke, drink alcohol, or use illegal drugs. Some items may interact with your medicine. What should I watch for while using this medicine? Visit your doctor or health care professional for regular checks on your progress. Check your blood pressure as directed. Ask your doctor or health care professional what your blood pressure should be and when you should contact him or her. Call your doctor or health care professional if you notice an irregular or fast heart beat. You may get drowsy or dizzy. Do not drive, use machinery, or do anything that needs mental alertness until you know how this drug affects you. Do not stand or sit up quickly, especially if you are an older patient. This reduces the risk of dizzy or fainting spells. Alcohol can make you more drowsy and dizzy. Avoid alcoholic drinks. Women should inform their doctor if they wish to become pregnant or think they might be pregnant. There is a potential for serious side effects to an unborn child. Talk to your health care professional or pharmacist for more information. Check with your doctor or health care professional if you get an attack of severe diarrhea, nausea and vomiting, or if you sweat a lot. The loss of too much body fluid can make it dangerous for you to take this medicine. Avoid salt substitutes unless you are told otherwise by your doctor  or health care professional. Do not treat yourself for coughs, colds, or pain while you are taking this medicine without asking your doctor or health care professional for advice. Some ingredients may increase your blood pressure. What side effects may I notice from receiving this medicine? Side effects that you should report to your doctor or health  care professional as soon as possible: -allergic reactions like skin rash, itching or hives, swelling of the face, lips, or tongue -breathing problems -change in amount of urine passed -chest pain -feeling faint or lightheaded, falls -fever or chills -numbness or tingling in your fingers or toes -redness, blistering, peeling or loosening of the skin, including inside the mouth -swelling of ankles, legs -unusual bleeding or bruising or pinpoint red spots on the skin Side effects that usually do not require medical attention (report to your doctor or health care professional if they continue or are bothersome): -change in sex drive or performance -cough -sun sensitivity -tiredness This list may not describe all possible side effects. Call your doctor for medical advice about side effects. You may report side effects to FDA at 1-800-FDA-1088. Where should I keep my medicine? Keep out of the reach of children. Store at room temperature below 30 degrees C (86 degrees F). Protect from moisture. Keep container tightly closed. Throw away any unused medicine after the expiration date. NOTE: This sheet is a summary. It may not cover all possible information. If you have questions about this medicine, talk to your doctor, pharmacist, or health care provider.    2016, Elsevier/[employer] Standard. (2008-03-08 17:37:55) Hypernatremia Hypernatremia is too much salt (sodium) in the blood. This happens when there is a shortage of water in the body. The balance of water and sodium in the body is vital to human function. When hypernatremia happens, the cells of the brain can become starved of water. Hypernatremia can happen to anyone, but it usually happens in people who may not drink enough fluids, such as elderly people who may depend on others to get fluids.In severe cases, hypernatremia can be life threatening. CAUSES This condition may be caused by:   Not drinking enough water. This may happen because of  a decreased ability to feel thirst.  A condition in which the kidneys remove too much urine from the body (polyuria). This is often related to diabetes.  Certain medicines, such as diuretics.   Water loss through exercise or sweating.   Diarrhea or vomiting.   Excessive sodium intake.   A genetic condition that affects the kidneys.  SYMPTOMS Symptoms of this condition may include:   Increased thirst. You may not have this symptom if your ability to feel thirsty is decreased.  Increased urination.   Muscle twitching or cramps.   Dizziness or light-headedness.   Seizures.   Headache.   Fatigue.   Generalized weakness.   Irritability.   Confusion.   Loss of consciousness.   Coma.  Sometimes, hypernatremia is caused by an underlying condition. In this case, symptoms may include:   Nausea.   Vomiting.   Diarrhea.   Fever.   Confusion (delirium).   Memory problems (dementia).   Diabetes.   Cushing syndrome. This condition affects the glands at the top of your kidneys (adrenal glands) that produce the hormone cortisol. DIAGNOSIS  This condition may be diagnosed based on:   Your symptoms and medical history. Your health care provider will ask about your water intake, potential water loss, urination, salt intake, medicines, and other symptoms.  A physical  or neurological exam.   Tests, including blood and urine tests.  TREATMENT  Depending on the severity, this condition may need to be treated right away. It often requires hospitalization. The goal of treatment is to balance the water and salt levels in your body and to find the underlying cause of your water loss. Treatment may be given through an IV tube or by mouth. Treatment may include the following:  Increased water intake.  IV fluids.   Adjustment of medicines that you are taking.  Fever management.   Diabetes control.  Ongoing lab tests will be done to help guide  treatment options.  HOME CARE INSTRUCTIONS   Drink enough fluid to keep your urine clear or pale yellow.  Take over-the-counter and prescription medicines only as told by your health care provider.  Avoid salty processed foods, including canned, jarred, frozen, or boxed foods. Some examples include pickles, frozen dinners, canned soups, potato and corn chips, and olives.  Always drink fluids after exercise or after vomiting or diarrhea.  Keep all follow-up visits as told by your health care provider. This is important. SEEK MEDICAL CARE IF:   You have diarrhea or vomiting.   You have a fever or chills.   You are unable to follow recommendations from your health care provider about fluid intake. SEEK IMMEDIATE MEDICAL CARE IF:  You are light-headed and weak.   You have a fast heartbeat.   You become confused or disoriented.   You become irritable or restless.   You have a seizure.   You faint.   You have signs of dehydration, such as:  Dark urine, or very little or no urine.   Cracked lips.   No tears.   Dry mouth.   Sunken eyes.   Sleepiness.   This information is not intended to replace advice given to you by your health care provider. Make sure you discuss any questions you have with your health care provider.   Document Released: 01/28/2012 Document Revised: 07/27/2015 Document Reviewed: 03/23/2015 Elsevier Interactive Patient Education [employer]2016 Elsevier Inc.

## 2015-11-07 NOTE — Clinical Social Work Placement (Signed)
   CLINICAL SOCIAL WORK PLACEMENT  NOTE  Date:  11/07/2015  Patient Details  Name: Travis Coleman MRN: [id number] Date of Birth: 06/08/1932  Clinical Social Work is seeking post-discharge placement for this patient at the Skilled  Nursing Facility level of care (*CSW will initial, date and re-position this form in  chart as items are completed):  Yes   Patient/family provided with [facility] Clinical Social Work Department's list of facilities offering this level of care within the geographic area requested by the patient (or if unable, by the patient's family).  Yes   Patient/family informed of their freedom to choose among providers that offer the needed level of care, that participate in Medicare, Medicaid or managed care program needed by the patient, have an available bed and are willing to accept the patient.  Yes   Patient/family informed of 's ownership interest in [facility] and [facility], as well as of the fact that they are under no obligation to receive care at these facilities.  PASRR submitted to EDS on 11/07/15     PASRR number received on 11/07/15     Existing PASRR number confirmed on       FL2 transmitted to all facilities in geographic area requested by pt/family on 11/06/15     FL2 transmitted to all facilities within larger geographic area on       Patient informed that his/her managed care company has contracts with or will negotiate with certain facilities, including the following:        Yes   Patient/family informed of bed offers received.  [facility]     Physician recommends and patient chooses bed at      [facility].  Patient to be transferred to facility by ambulance [facility])     Patient family notified on 11/07/15 of transfer.  Name of family member notified:  pt notified at bedside, pt sister, Aram BeechamCynthia notified via  telephone.     PHYSICIAN Please sign FL2     Additional Comment:    _______________________________________________ Orson EvaKIDD, SUZANNA A, LCSW 11/07/2015, 12:38 PM

## 2015-11-07 NOTE — Discharge Summary (Addendum)
Physician Discharge Summary  EDER MACEK WUJ:[id number] DOB: February 15, 1932 DOA: 11/02/2015  PCP: Florentina Jenny, MD  Admit date: 11/02/2015 Discharge date: 11/07/2015  Recommendations for Outpatient Follow-up:  1. Continue Vasotec and hydralazine for blood pressure control. Hold Lasix as it can worsen renal function in combination with vasotec. Monitor renal function per skilled nursing facility protocol to make sure it is stable while patient is on a Vasotec. 2. Monitor sodium level. Please note patient was on sodium tablets prior to the admission which likely contributed to elevated sodium level. Sodium tablets stopped. Monitor sodium per skilled nursing facility protocol. 3. Follow up GI pathogen panel  Sliding scale insulin:  CBG 121 - 150: 1 unit     CBG 151 - 200: 2 units     CBG 201 - 250: 3 units     CBG 251 - 300: 5 units     CBG 301 - 350: 7 units     CBG 351 - 400 9 units            Discharge Diagnoses:  Principal Problem:   Infectious diarrhea in adult patient Active Problems:   Hypertension   Diabetes mellitus type 2 with complications (HCC)   CAD (coronary artery disease) s/p CABG   Dementia   AKI (acute kidney injury) (HCC)   Hypoglycemia   Hypernatremia   SIRS (systemic inflammatory response syndrome) (HCC)   Bronchospasm   Sepsis, unspecified organism (HCC)   Bradycardia   Controlled type 2 diabetes mellitus with diabetic nephropathy, with long-term current use of insulin (HCC)    Discharge Condition: stable   Diet recommendation: as tolerated   History of present illness:  81 y.o. male with past medical history of diabetes, dyslipidemia, hypertension, dementia who presented to [facility] from SNF with fever and diarrhea. His stool for C.diff was negative.He was found to have few skipped beats on telemetry, bradycardia and elevated troponin levels so Dr. Algie Coffer has seen him in consultation with no further cardiac work up recommended at this time.    Hospital Course:    Assessment/Plan:    Principal Problem: Diarrhea / Viral gastroenteritis - Stool for C.diff negative - No diarrhea in past 24 hours - GI pathogen panel pending as of 12/19 - Blood cultures negative so far   Active Problems: Bradycardia with elevated troponin levels - Likely demand ischemia from chronic renal failure  - Trop level stabilized and improved to 0.4 - No further work up required per cardiology - Continue daily aspirin - 2 D ECHO on this admission with EF 55% with grade 1 diastolic dysfunction   Chronic kidney disease stage III - Cr improved with fluids   Hypernatremia - Likely due to sodium tablets - Sodium tablets stopped - Recheck in next week to make sure it is stable, normal   Hypomagnesemia - Due to GI losses - Supplemented - Magnesium level WNL  Anemia of chronic renal failure stage 3 - Hemoglobin stable at 11  Thrombocytopenia - Likely from heparin so stopped subQ heparin 12/18 and used SCD's for DVT prophylaxis   Hypertension, essential - Continue hydralazine 10 mg TID - Continue Vasotec   Controlled diabetes mellitus with diabetic nephropathy with long term insulin use - Continue novolog 3 units tid along with SSI   CBG 121 - 150: 1 unit     CBG 151 - 200: 2 units     CBG 201 - 250: 3 units     CBG 251 - 300: 5 units  CBG 301 - 350: 7 units     CBG 351 - 400 9 units         Dyslipidemia associated with type 2 diabetes mellitus, goal LDL less tahn 70 - Continue lipitor 20 mg daily   Dementia with behavioral disturbance - Continue Aricept, Depakote   DVT Prophylaxis  - SCD's  Code Status: Full.  Family Communication: Family not at the bedside this am   IV access:  Peripheral IV  Procedures and diagnostic studies:   Ct Abdomen Pelvis Wo Contrast 11/03/2015 1. No definite acute abnormality seen within the abdomen or pelvis. 2. Trace fluid at the right lower quadrant is  nonspecific. 3. Diffuse calcification along the abdominal aorta and its branches, including along the superior mesenteric artery, inferior mesenteric artery and at the proximal renal arteries bilaterally. 4. Mild bibasilar atelectasis noted. 5. Diffuse coronary artery calcifications seen. 6. Mild bilateral renal atrophy noted.   Dg Chest 2 View 11/02/2015 Cardiac enlargement. No evidence of active pulmonary disease. Electronically Signed By: Burman Nieves M.D. On: 11/02/2015 23:54   Dg Chest Port 1 View 11/03/2015 Shallow inspiration with atelectasis in the lung bases. Cardiac enlargement. No evidence of active pulmonary disease. Electronically Signed By: Burman Nieves M.D. On: 11/03/2015 03:10   Medical Consultants:  Cardiology, Dr. [name]   Other Consultants:  PT  IAnti-Infectives:   None    Signed:  Manson Passey, MD  [facility] 11/07/2015, 10:22 AM  Pager #: [phone]  Time spent in minutes: more than 30 minutes   Discharge Exam: Filed Vitals:   11/06/15 2031 11/07/15 0038  BP: 177/61 150/60  Pulse: 75   Temp: 97.4 F (36.3 C)   Resp: 22    Filed Vitals:   11/06/15 0620 11/06/15 1604 11/06/15 2031 11/07/15 0038  BP: 158/60 185/61 177/61 150/60  Pulse:  73 75   Temp:  97.6 F (36.4 C) 97.4 F (36.3 C)   TempSrc:  Oral Oral   Resp:  20 22   Height:      Weight:      SpO2:  100% 100%     General: Pt is alert, not in acute distress Cardiovascular: Rate controlled, S1/S2 + Respiratory: No wheezing, no crackles, no rhonchi Abdominal: Soft, non tender, non distended, bowel sounds +, no guarding Extremities: no cyanosis, pulses palpable bilaterally DP and PT Neuro: Grossly nonfocal  Discharge Instructions  Discharge Instructions    Call MD for:  difficulty breathing, headache or visual disturbances    Complete by:  As directed      Call MD for:  persistant dizziness or light-headedness    Complete by:  As directed       Call MD for:  persistant nausea and vomiting    Complete by:  As directed      Call MD for:  severe uncontrolled pain    Complete by:  As directed      Diet - low sodium heart healthy    Complete by:  As directed      Discharge instructions    Complete by:  As directed   1. Continue Vasotec and hydralazine for blood pressure control. Hold Lasix as it can worsen renal function in combination with vasotec. Monitor renal function per skilled nursing facility protocol to make sure it is stable while patient is on a Vasotec. 2. Monitor sodium level. Please note patient was on sodium tablets prior to the admission which likely contributed to elevated sodium level. Sodium tablets stopped. Monitor  sodium per skilled nursing facility protocol.     Increase activity slowly    Complete by:  As directed             Medication List    STOP taking these medications        furosemide 20 MG tablet  Commonly known as:  LASIX     insulin glargine 100 UNIT/ML injection  Commonly known as:  LANTUS     LORazepam 0.5 MG tablet  Commonly known as:  ATIVAN     NAMENDA XR 14 MG Cp24 24 hr capsule  Generic drug:  memantine     sodium chloride 1 G tablet     traZODone 50 MG tablet  Commonly known as:  DESYREL      TAKE these medications        acetaminophen 500 MG tablet  Commonly known as:  TYLENOL  Take 1 tablet (500 mg total) by mouth every 6 (six) hours as needed.     albuterol (2.5 MG/3ML) 0.083% nebulizer solution  Commonly known as:  PROVENTIL  Take 3 mLs (2.5 mg total) by nebulization every 6 (six) hours as needed for wheezing or shortness of breath.     aspirin EC 81 MG tablet  Take 81 mg by mouth daily.     atorvastatin 20 MG tablet  Commonly known as:  LIPITOR  Take 20 mg by mouth at bedtime.     cholecalciferol 1000 UNITS tablet  Commonly known as:  VITAMIN D  Take 2,000 Units by mouth daily.     divalproex 125 MG capsule  Commonly known as:  DEPAKOTE SPRINKLE  Take  250 mg by mouth 3 (three) times daily.     donepezil 10 MG tablet  Commonly known as:  ARICEPT  Take 10 mg by mouth at bedtime.     enalapril 10 MG tablet  Commonly known as:  VASOTEC  Take 10 mg by mouth daily.     ferrous sulfate 325 (65 FE) MG tablet  Take 325 mg by mouth daily with breakfast.     hydrALAZINE 10 MG tablet  Commonly known as:  APRESOLINE  Take 10 mg by mouth 3 (three) times daily.     insulin aspart 100 UNIT/ML injection  Commonly known as:  novoLOG  Inject 0-9 Units into the skin every 4 (four) hours.     insulin aspart 100 UNIT/ML FlexPen  Commonly known as:  NOVOLOG FLEXPEN  Inject 5 Units into the skin 3 (three) times daily with meals. If BS is less than 75 or he does not eat     SILTUSSIN DAS 100 MG/5ML liquid  Generic drug:  guaiFENesin  Take 200 mg by mouth every 6 (six) hours as needed for cough.           Follow-up Information    Follow up with Florentina JennyRIPP, HENRY, MD. Schedule an appointment as soon as possible for a visit in 2 weeks.   Specialty:  Family Medicine   Why:  Follow up appt after recent hospitalization   Contact information:   [address] [place] [address] [phone]        The results of significant diagnostics from this hospitalization (including imaging, microbiology, ancillary and laboratory) are listed below for reference.    Significant Diagnostic Studies: Ct Abdomen Pelvis Wo Contrast  11/03/2015  CLINICAL DATA:  Acute onset of diarrhea and generalized abdominal distention. Initial encounter. EXAM: CT ABDOMEN AND PELVIS WITHOUT CONTRAST TECHNIQUE: Multidetector  CT imaging of the abdomen and pelvis was performed following the standard protocol without IV contrast. COMPARISON:  None. FINDINGS: Mild bibasilar atelectasis is noted. The patient is status post median sternotomy. Diffuse coronary artery calcifications are seen. The liver and spleen are unremarkable in appearance. The gallbladder is within  normal limits. The pancreas and adrenal glands are unremarkable. Mild bilateral renal atrophy is noted. Mild perinephric stranding is noted bilaterally. There is no evidence of hydronephrosis. No renal or ureteral stones are seen. The small bowel is unremarkable in appearance. The stomach is within normal limits. No acute vascular abnormalities are seen. Diffuse calcification is noted along the abdominal aorta and its branches, including along the superior mesenteric artery, inferior mesenteric artery and at the proximal renal arteries bilaterally. The appendix is not definitely seen; there is no evidence for appendicitis. The colon is unremarkable in appearance. Trace fluid at the right lower quadrant is nonspecific. The bladder is mildly distended and grossly unremarkable. The patient is status post hysterectomy. No suspicious adnexal masses are seen. No inguinal lymphadenopathy is seen. No acute osseous abnormalities are identified. IMPRESSION: 1. No definite acute abnormality seen within the abdomen or pelvis. 2. Trace fluid at the right lower quadrant is nonspecific. 3. Diffuse calcification along the abdominal aorta and its branches, including along the superior mesenteric artery, inferior mesenteric artery and at the proximal renal arteries bilaterally. 4. Mild bibasilar atelectasis noted. 5. Diffuse coronary artery calcifications seen. 6. Mild bilateral renal atrophy noted. Electronically Signed   By: Roanna Raider M.D.   On: 11/03/2015 04:51   Dg Chest 2 View  11/02/2015  CLINICAL DATA:  Diarrhea.  Possible C difficile.  Dementia. EXAM: CHEST  2 VIEW COMPARISON:  06/28/2014 FINDINGS: Postoperative changes in the mediastinum. Shallow inspiration. Mild cardiac enlargement. No significant vascular congestion. No focal airspace disease or consolidation. No blunting of costophrenic angles. No pneumothorax. Mildly tortuous aorta. IMPRESSION: Cardiac enlargement.  No evidence of active pulmonary disease.  Electronically Signed   By: Burman Nieves M.D.   On: 11/02/2015 23:54   Dg Chest Port 1 View  11/03/2015  CLINICAL DATA:  Acute shortness of breath and wheezing. EXAM: PORTABLE CHEST 1 VIEW COMPARISON:  11/02/2015 FINDINGS: Postoperative changes in the mediastinum. Shallow inspiration with atelectasis in the lung bases. No focal consolidation in the lungs. No blunting of costophrenic angles. No pneumothorax. Cardiac enlargement without vascular congestion or edema. IMPRESSION: Shallow inspiration with atelectasis in the lung bases. Cardiac enlargement. No evidence of active pulmonary disease. Electronically Signed   By: Burman Nieves M.D.   On: 11/03/2015 03:10    Microbiology: Recent Results (from the past 240 hour(s))  Urine culture     Status: None   Collection Time: 11/03/15 12:47 AM  Result Value Ref Range Status   Specimen Description URINE, CLEAN CATCH  Final   Special Requests Normal  Final   Culture   Final    NO GROWTH 1 DAY Performed at [facility]    Report Status 11/04/2015 FINAL  Final  C difficile quick scan w PCR reflex     Status: None   Collection Time: 11/03/15 12:49 AM  Result Value Ref Range Status   C Diff antigen NEGATIVE NEGATIVE Final   C Diff toxin NEGATIVE NEGATIVE Final   C Diff interpretation Negative for toxigenic C. difficile  Final  Culture, blood (routine x 2)     Status: None (Preliminary result)   Collection Time: 11/03/15  3:40 AM  Result Value Ref  Range Status   Specimen Description BLOOD RIGHT ANTECUBITAL  Final   Special Requests BOTTLES DRAWN AEROBIC AND ANAEROBIC  Final   Culture   Final    NO GROWTH 3 DAYS Performed at [facility]    Report Status PENDING  Incomplete  Culture, blood (routine x 2)     Status: None (Preliminary result)   Collection Time: 11/03/15  4:00 AM  Result Value Ref Range Status   Specimen Description BLOOD BLOOD RIGHT HAND  Final   Special Requests BOTTLES DRAWN AEROBIC ONLY 6.5ML  Final    Culture   Final    NO GROWTH 3 DAYS Performed at [facility]    Report Status PENDING  Incomplete  MRSA PCR Screening     Status: None   Collection Time: 11/03/15  3:30 PM  Result Value Ref Range Status   MRSA by PCR NEGATIVE NEGATIVE Final    Comment:        The GeneXpert MRSA Assay (FDA approved for NASAL specimens only), is one component of a comprehensive MRSA colonization surveillance program. It is not intended to diagnose MRSA infection nor to guide or monitor treatment for MRSA infections.      Labs: Basic Metabolic Panel:  Recent Labs Lab 11/02/15 2316 11/03/15 1024 11/04/15 0340 11/05/15 0516 11/06/15 0518  NA 146* 141 146* 147* 151*  K 4.2 3.7 3.8 4.0 3.6  CL 115* 116* 120* 123* 125*  CO2 18* 15* 16* 14* 19*  GLUCOSE 60* 173* 114* 145* 161*  BUN 49* 38* 32* 21* 17  CREATININE 1.90* 1.63* 1.57* 1.18 1.05  CALCIUM 9.3 7.6* 7.9* 7.9* 8.2*  MG  --   --  1.4* 1.5* 1.9   Liver Function Tests:  Recent Labs Lab 11/02/15 2316 11/03/15 1024 11/04/15 0340  AST 25 26 32  ALT 32 26 27  ALKPHOS 66 49 50  BILITOT 0.6 0.7 0.8  PROT 7.5 5.8* 5.7*  ALBUMIN 4.0 3.0* 2.8*    Recent Labs Lab 11/02/15 2316  LIPASE 20   No results for input(s): AMMONIA in the last 168 hours. CBC:  Recent Labs Lab 11/02/15 2316 11/03/15 1024 11/04/15 0340 11/05/15 0516 11/06/15 0518  WBC 4.7 6.7 6.8 7.6 8.5  NEUTROABS  --  4.6 3.7  --   --   HGB 13.2 12.6* 11.3* 11.0* 11.0*  HCT 40.8 39.5 34.1* 33.4* 33.7*  MCV 98.8 99.0 98.3 96.5 96.6  PLT 167 137* 138* 141* 149*   Cardiac Enzymes:  Recent Labs Lab 11/03/15 2114 11/04/15 0340 11/04/15 1020 11/05/15 0516  TROPONINI 1.06* 0.91* 0.77* 0.40*   BNP: BNP (last 3 results) No results for input(s): BNP in the last 8760 hours.  ProBNP (last 3 results) No results for input(s): PROBNP in the last 8760 hours.  CBG:  Recent Labs Lab 11/06/15 0737 11/06/15 1226 11/06/15 1651 11/06/15 2243  11/07/15 0744  GLUCAP 150* 203* 199* 180* 161*

## 2015-11-07 NOTE — Progress Notes (Addendum)
CSW continuing to follow to assist with pt discharge today.  [facility], but pt needing increased assistance during PT evaluation and per [facility], [facility] prior to return.  Weekend CSW had discussed with pt sister and pt sister [facility].  CSW spoke with pt sister, Travis Coleman this morning to discuss that [facility] care does feel that pt will need rehab. Pt [facility] and [facility] at [facility] due to renovations. Additional bed offers reviewed and pt sister chooses [facility] as pt could offer a private room due to pt being on enteric precautions.   [facility] with [facility] and facility confirmed that facility could accept pt today. CSW notified [facility] of where pt will be going to rehab and facility confirmed that SNF can update facility and [facility] once pt progresses enough to return to baseline in order for ALF to be able to manage pt care.   CSW facilitated pt discharge needs including contacting facility, faxing pt discharge information via [facility], discussing with pt at bedside although pt is pleasantly confused secondary to dementia and pt sister, Travis Coleman via telephone, providing RN phone number to call report, and scheduling ambulance pick up via [facility] for 1:30 pm.   Pt sister expressed gratefulness for assistance with disposition planning.   No further social work needs identified at this time.  CSW signing off.   Loletta SpecterSuzanna Coleman, MSW, LCSW Clinical Social Work [phone]

## 2015-11-07 NOTE — [place] FL2 (Signed)
[place] MEDICAID FL2 LEVEL OF CARE SCREENING TOOL     IDENTIFICATION  Patient Name: Travis Coleman Birthdate: 06/05/1932 Sex: male Admission Date (Current Location): 11/02/2015  [place]:   [id number] N Facility and Address:  [facility],  [address], [place] [id number]      Provider Number: [id number]  Attending Physician Name and Address:  Alison MurrayAlma M Devine, MD  Relative Name and Phone Number:       Current Level of Care: Hospital Recommended Level of Care: Skilled Nursing Facility Prior Approval Number:    Date Approved/Denied:   PASRR Number: [id number]  Discharge Plan: SNF    Current Diagnoses: Patient Active Problem List   Diagnosis Date Noted  . Controlled type 2 diabetes mellitus with diabetic nephropathy, with long-term current use of insulin (HCC)   . Infectious diarrhea in adult patient 11/03/2015  . AKI (acute kidney injury) (HCC) 11/03/2015  . Hypoglycemia 11/03/2015  . Hypernatremia 11/03/2015  . SIRS (systemic inflammatory response syndrome) (HCC) 11/03/2015  . Bronchospasm   . Sepsis, unspecified organism (HCC)   . Bradycardia   . Syncope 12/07/2013  . Fluid overload 12/05/2013  . Altered mental status 12/04/2013  . Hypertension 01/03/2011  . Diabetes mellitus type 2 with complications (HCC) 01/03/2011  . PVD (peripheral vascular disease) (HCC) 01/03/2011  . CAD (coronary artery disease) s/p CABG 01/03/2011  . Hyperlipidemia 01/03/2011  . Dementia 01/03/2011  . Person living in residential institution 01/03/2011    Orientation RESPIRATION BLADDER Height & Weight    Self  Normal Incontinent 5\' 8"  (172.7 cm) 219 lbs.  BEHAVIORAL SYMPTOMS/MOOD NEUROLOGICAL BOWEL NUTRITION STATUS   (NONE)  (NONE) Incontinent Diet (Diet low sodium )  AMBULATORY STATUS COMMUNICATION OF NEEDS Skin   Extensive Assist Verbally Normal                       Personal Care Assistance Level of Assistance  Bathing, Feeding,  Dressing Bathing Assistance: Limited assistance Feeding assistance: Independent Dressing Assistance: Limited assistance     Functional Limitations Info  Sight, Hearing, Speech Sight Info: Adequate Hearing Info: Adequate Speech Info: Adequate    SPECIAL CARE FACTORS FREQUENCY  PT (By licensed PT), OT (By licensed OT)     PT Frequency: 5 x a week OT Frequency: 5 x a week            Contractures Contractures Info: Not present    Additional Factors Info  Code Status, Allergies, Psychotropic, Insulin Sliding Scale, Isolation Precautions Code Status Info: FULL code status Allergies Info: No Known Allergies Psychotropic Info: Depakote Sprinkle; Aricept Insulin Sliding Scale Info: Sliding Scale Insulin Isolation Precautions Info: Enteric precautions (UV disinfection)-CDIFF Negative, but pt has an Infectious diarrhea in adult patient     Current Medications (11/07/2015):  This is the current hospital active medication list Current Facility-Administered Medications  Medication Dose Route Frequency Provider Last Rate Last Dose  . 0.9 %  sodium chloride infusion   Intravenous Continuous Alison MurrayAlma M Devine, MD 10 mL/hr at 11/06/15 1124 10 mL/hr at 11/06/15 1124  . acetaminophen (TYLENOL) tablet 1,000 mg  1,000 mg Oral Once Devoria AlbeIva Knapp, MD   1,000 mg at 11/03/15 0315  . albuterol (PROVENTIL) (2.5 MG/3ML) 0.083% nebulizer solution 2.5 mg  2.5 mg Nebulization Q6H PRN Rolly SalterPranav M Patel, MD      . aspirin EC tablet 81 mg  81 mg Oral Daily Hillary BowJared M Gardner, DO   81 mg at 11/07/15 [id number]  .  atorvastatin (LIPITOR) tablet 20 mg  20 mg Oral QHS Hillary Bow, DO   20 mg at 11/06/15 2321  . divalproex (DEPAKOTE SPRINKLE) capsule 250 mg  250 mg Oral TID Hillary Bow, DO   250 mg at 11/06/15 2321  . donepezil (ARICEPT) tablet 10 mg  10 mg Oral QHS [name], MD   10 mg at 11/06/15 2322  . ferrous sulfate tablet 325 mg  325 mg Oral Q breakfast Hillary Bow, DO   325 mg at 11/07/15 2841  . hydrALAZINE  (APRESOLINE) injection 10 mg  10 mg Intravenous Q6H PRN Starleen Arms, MD   10 mg at 11/05/15 2357  . hydrALAZINE (APRESOLINE) tablet 10 mg  10 mg Oral TID Starleen Arms, MD   10 mg at 11/07/15 0852  . insulin aspart (novoLOG) injection 0-9 Units  0-9 Units Subcutaneous 6 times per day Hillary Bow, DO   2 Units at 11/07/15 3244     Discharge Medications: Please see discharge summary for a list of discharge medications.  Relevant Imaging Results:  Relevant Lab Results:   Additional Information SSN: [id number]  Constantin Hillery A, LCSW

## 2015-11-07 NOTE — Progress Notes (Signed)
Physical Therapy Treatment Patient Details Name: Travis Coleman MRN: [id number] DOB: 24-Jul-1932 Today's Date: 11/07/2015    History of Present Illness 81 yo male admitted with infectious diarrhea, 2* AV block, type I. Hx of DM,CAD,CABD, Alz dementia, PVD. Pt is from ALF    PT Comments    Pt continues to required at least Mod assist for mobility. Sit to stand x3 on today-able to stand for ~20 seconds each trial. Pt is still unable to take any steps. Continues to have difficulty with standing balance. Recommend SNF.   Follow Up Recommendations  SNF;Supervision/Assistance - 24 hour     Equipment Recommendations  None recommended by PT    Recommendations for Other Services       Precautions / Restrictions Precautions Precautions: Fall Precaution Comments: enteric prec Restrictions Weight Bearing Restrictions: No    Mobility  Bed Mobility Overal bed mobility: Needs Assistance Bed Mobility: Supine to Sit;Sit to Supine     Supine to sit: Mod assist Sit to supine: Mod assist   General bed mobility comments: Assist for trunk and bil LEs. Increased time. +2 to scoot towards HOB with bed in trendelenbery and with use of bedpads.   Transfers Overall transfer level: Needs assistance Equipment used: Rolling walker (2 wheeled) Transfers: Sit to/from Stand Sit to Stand: Mod assist         General transfer comment: x3. Assist to rise, stabilize, control descent. Pt with mod posterior lean. Only able to stand for ~10-15 seconds each attempt. Unable to take any steps.   Ambulation/Gait             General Gait Details: NT-pt unable at this time   Stairs            Wheelchair Mobility    Modified Rankin (Stroke Patients Only)       Balance Overall balance assessment: Needs assistance Sitting-balance support: Feet supported;Bilateral upper extremity supported Sitting balance-Leahy Scale: Good   Postural control: Posterior lean Standing balance support:  Bilateral upper extremity supported;During functional activity Standing balance-Leahy Scale: Poor                      Cognition Arousal/Alertness: Awake/alert Behavior During Therapy: WFL for tasks assessed/performed Overall Cognitive Status: History of cognitive impairments - at baseline                      Exercises      General Comments        Pertinent Vitals/Pain Pain Assessment: Faces Faces Pain Scale: Hurts little more Pain Location: R lower leg/calf area Pain Descriptors / Indicators: Sore Pain Intervention(s): Monitored during session;Limited activity within patient's tolerance;Repositioned    Home Living                      Prior Function            PT Goals (current goals can now be found in the care plan section) Progress towards PT goals: Progressing toward goals    Frequency  Min 3X/week    PT Plan Current plan remains appropriate    Co-evaluation             End of Session Equipment Utilized During Treatment: Gait belt Activity Tolerance: Patient limited by fatigue;Patient limited by pain Patient left: with call bell/phone within reach (with all bedrails raises. Bed alarm unable to be set for bed)     Time: 4098-1191 PT Time Calculation (min) (ACUTE  ONLY): 17 min  Charges:  $Therapeutic Activity: 8-22 mins                    G Codes:      Rebeca AlertJannie Olyver Hawes, MPT Pager: [phone]

## 2015-11-07 NOTE — Care Management Important Message (Signed)
Important Message  Patient Details Important Message  Patient Details IM Letter given to Cookie/Case Manager to present to Patient Name: Travis Coleman MRN: [id number] Date of Birth: 1931/11/20   Medicare Important Message Given:  Yes    Haskell FlirtJamison, Nadea Kirkland 11/07/2015, 11:57 AM Name: Travis Coleman MRN: [id number] Date of Birth: 1931/11/20   Medicare Important Message Given:  Yes    Haskell FlirtJamison, Bolden Hagerman 11/07/2015, 11:56 AM

## 2015-11-08 ENCOUNTER — Encounter: Payer: Self-pay | Admitting: Adult Health

## 2015-11-08 ENCOUNTER — Non-Acute Institutional Stay (SKILLED_NURSING_FACILITY): Payer: Medicare Other | Admitting: Adult Health

## 2015-11-08 DIAGNOSIS — E785 Hyperlipidemia, unspecified: Secondary | ICD-10-CM

## 2015-11-08 DIAGNOSIS — E1121 Type 2 diabetes mellitus with diabetic nephropathy: Secondary | ICD-10-CM

## 2015-11-08 DIAGNOSIS — I5022 Chronic systolic (congestive) heart failure: Secondary | ICD-10-CM | POA: Diagnosis not present

## 2015-11-08 DIAGNOSIS — I119 Hypertensive heart disease without heart failure: Secondary | ICD-10-CM | POA: Diagnosis not present

## 2015-11-08 DIAGNOSIS — I110 Hypertensive heart disease with heart failure: Secondary | ICD-10-CM | POA: Insufficient documentation

## 2015-11-08 DIAGNOSIS — I25709 Atherosclerosis of coronary artery bypass graft(s), unspecified, with unspecified angina pectoris: Secondary | ICD-10-CM | POA: Diagnosis not present

## 2015-11-08 DIAGNOSIS — J9801 Acute bronchospasm: Secondary | ICD-10-CM | POA: Diagnosis not present

## 2015-11-08 DIAGNOSIS — Z794 Long term (current) use of insulin: Secondary | ICD-10-CM

## 2015-11-08 DIAGNOSIS — F0150 Vascular dementia without behavioral disturbance: Secondary | ICD-10-CM | POA: Insufficient documentation

## 2015-11-08 DIAGNOSIS — E1169 Type 2 diabetes mellitus with other specified complication: Secondary | ICD-10-CM

## 2015-11-08 DIAGNOSIS — E870 Hyperosmolality and hypernatremia: Secondary | ICD-10-CM

## 2015-11-08 HISTORY — DX: Chronic systolic (congestive) heart failure: I50.22

## 2015-11-08 LAB — CULTURE, BLOOD (ROUTINE X 2)
CULTURE: NO GROWTH
Culture: NO GROWTH

## 2015-11-08 NOTE — Progress Notes (Signed)
Patient ID: Travis Coleman, male   DOB: March 12, 1932, 81 y.o.   MRN: [id number]    Facility:  [facility]      No Known Allergies  Chief Complaint  Patient presents with  . Hospitalization Follow-up    HPI:  He had been living in assisted living facility ; which was having a norovirus outbreak. He was brought to the hospital for fever and diarrhea. He was hospitalized for a noninfectious gastroenteritis. He developed hypernatremia and his sodium tablets were stopped.  He is unable to participate in the hpi or ros. There are no nursing concerns at this time.     Past Medical History  Diagnosis Date  . Diabetes mellitus   . Hyperlipidemia   . Hypertension   . CAD (coronary artery disease) of bypass graft     Multivessel  . Dementia     MMSE 18/30 Feb 2012 [facility])  . PVD (peripheral vascular disease) (HCC)   . Cataract     bilateral  . Hearing deficit   . CHF (congestive heart failure) [facility])     Past Surgical History  Procedure Laterality Date  . Coronary artery bypass graft      VITAL SIGNS BP 147/80 mmHg  Pulse 62  Ht  (1.727 m)  Wt 219 lb (99.338 kg)  BMI 33.31 kg/m2  SpO2 98%  Patient's Medications  New Prescriptions   No medications on file  Previous Medications   ACETAMINOPHEN (TYLENOL) 500 MG TABLET    Take 1 tablet (500 mg total) by mouth every 6 (six) hours as needed.   ALBUTEROL (PROVENTIL) (2.5 MG/3ML) 0.083% NEBULIZER SOLUTION    Take 3 mLs (2.5 mg total) by nebulization every 6 (six) hours as needed for wheezing or shortness of breath.   ASPIRIN EC 81 MG TABLET    Take 81 mg by mouth daily.   ATORVASTATIN (LIPITOR) 20 MG TABLET    Take 20 mg by mouth at bedtime.   CHOLECALCIFEROL (VITAMIN D) 1000 UNITS TABLET    Take 2,000 Units by mouth daily.   DIVALPROEX (DEPAKOTE SPRINKLE) 125 MG CAPSULE    Take 250 mg by mouth 3 (three) times daily.    DONEPEZIL (ARICEPT) 10 MG TABLET    Take 10 mg by mouth at bedtime.   ENALAPRIL (VASOTEC) 10 MG TABLET     Take 10 mg by mouth daily.   FERROUS SULFATE 325 (65 FE) MG TABLET    Take 325 mg by mouth daily with breakfast.   GUAIFENESIN (SILTUSSIN DAS) 100 MG/5ML LIQUID    Take 200 mg by mouth every 6 (six) hours as needed for cough.   HYDRALAZINE (APRESOLINE) 10 MG TABLET    Take 10 mg by mouth 3 (three) times daily.   INSULIN ASPART (NOVOLOG) 100 UNIT/ML INJECTION    Inject 5 Units into the skin 3 (three) times daily before meals. For cbg >=150  Modified Medications   No medications on file  Discontinued Medications     SIGNIFICANT DIAGNOSTIC EXAMS  11-02-15: chest x-ray: Cardiac enlargement.  No evidence of active pulmonary disease.  11-03-15: chest x-ray; Shallow inspiration with atelectasis in the lung bases. Cardiac enlargement. No evidence of active pulmonary disease.   11-03-15: ct of abdomen and pelvis: 1. No definite acute abnormality seen within the abdomen or pelvis. 2. Trace fluid at the right lower quadrant is nonspecific. 3. Diffuse calcification along the abdominal aorta and its branches, including along the superior mesenteric artery, inferior mesenteric artery and at the  proximal renal arteries bilaterally. 4. Mild bibasilar atelectasis noted. 5. Diffuse coronary artery calcifications seen. 6. Mild bilateral renal atrophy noted.  11-04-15: 2-d echo: Left ventricle: The cavity size was normal. There was mild concentric hypertrophy. Systolic function was normal. The estimated ejection fraction was in the range of 50% to 55%. Wall motion was normal; there were no regional wall motion abnormalities. Doppler parameters are consistent with abnormal left ventricular relaxation (grade 1 diastolic dysfunction). - Aortic valve: Valve mobility was restricted. Transvalvular velocity was increased, due to stenosis. There was mild stenosis. - Mitral valve: Calcified annulus. There was mild regurgitation. - Left atrium: The atrium was mildly dilated. - Right atrium: The atrium was mildly  dilated.     LABS REVIEWED:   11-02-15: wbc 4.7; hgb 13.2; hct 40.8; mcv 98.8; plt 167; glucose 60; bun 49; creat 1.90; k+ 4.2; na++146; liver normal albumin 4.0; lipase 20; tsh 0.614: urine culture: no growth: c-diff: neg  GI pathogen panel: negative 11-04-15: wbc 6.8; hgb 11.3; hct 34.1; mcv 98.3; plt 138; glucose 114; bun 32; creat 1.57; k+ 3.8; na++146; liver normal albumin 2.8 11-05-15: wbc 7.6; hgb 11.0; hct 33.4; mcv 96.5; plt 141; glucose 145; bun 21; creat 1.18; k+ 4.0; na++147; mag 1.5  11-06-15: wbc 8.5; hgb 11.0; hct 33.7; mcv 96.6; plt 149; glucose 161; bun 17; creat 1.05; k+ 3.6; na++151    Review of Systems  Unable to perform ROS: dementia    Physical Exam  Constitutional: No distress.  Overweight   Eyes: Conjunctivae are normal.  Neck: Neck supple. No JVD present. No thyromegaly present.  Cardiovascular: Normal rate, regular rhythm and intact distal pulses.   Respiratory: Effort normal and breath sounds normal. No respiratory distress. He has no wheezes.  GI: Soft. Bowel sounds are normal. He exhibits no distension. There is no tenderness.  Musculoskeletal: He exhibits no edema.  Able to move all extremities   Lymphadenopathy:    He has no cervical adenopathy.  Neurological: He is alert.  Skin: Skin is warm and dry. He is not diaphoretic.  Psychiatric: He has a normal mood and affect.       ASSESSMENT/ PLAN:  1. Dyslipidemia: will continue lipitor 20 mg daily   2. Anemia of chronic disease: hgb is 11.0; will continue iron daily   3. Hypertension: will continue vasotec 10 mg daily hydralazine 10 mg three times daily will monitor Has calcification in bilateral renal arteries   4. CAD: status post cabg: no complaint of chest pain present; will continue asa 81 mg daily   5. Vascular dementia: no change in status; his current weight is 219 pounds; will continue aricept 10 mg nightly will continue depakote sprinkles 250 mg three times daily to stabilize mood     6. Diabetes: will continue novolog 5 units with meals for cbg >=150  7. Bronchospasm: is stable will continue albuterol neb every 6 hours as needed and robitussin 200 mg every 6 hours as needed  8. Hypernatremia: his sodium tablets have been stopped; will monitor  9. CHF: he is presently stable will continue hydralazine 10 mg three times daily is presently not on diuretic will monitor   10. Infectious diarrhea: has resolved; will monitor   Time spent with patient  50  minutes >50% time spent counseling; reviewing medical record; tests; labs; and developing future plan of care       Synthia Innocenteborah Elius Etheredge NP [facility]  Contact [phone] Monday through Friday 8am- 5pm  After hours call  [phone]   

## 2015-11-10 ENCOUNTER — Encounter: Payer: Self-pay | Admitting: Internal Medicine

## 2015-11-10 ENCOUNTER — Non-Acute Institutional Stay (SKILLED_NURSING_FACILITY): Payer: Medicare Other | Admitting: Internal Medicine

## 2015-11-10 DIAGNOSIS — A09 Infectious gastroenteritis and colitis, unspecified: Secondary | ICD-10-CM

## 2015-11-10 DIAGNOSIS — E785 Hyperlipidemia, unspecified: Secondary | ICD-10-CM | POA: Insufficient documentation

## 2015-11-10 DIAGNOSIS — N189 Chronic kidney disease, unspecified: Secondary | ICD-10-CM | POA: Diagnosis not present

## 2015-11-10 DIAGNOSIS — I110 Hypertensive heart disease with heart failure: Secondary | ICD-10-CM

## 2015-11-10 DIAGNOSIS — D631 Anemia in chronic kidney disease: Secondary | ICD-10-CM | POA: Diagnosis not present

## 2015-11-10 DIAGNOSIS — E1121 Type 2 diabetes mellitus with diabetic nephropathy: Secondary | ICD-10-CM

## 2015-11-10 DIAGNOSIS — R001 Bradycardia, unspecified: Secondary | ICD-10-CM | POA: Diagnosis not present

## 2015-11-10 DIAGNOSIS — F0150 Vascular dementia without behavioral disturbance: Secondary | ICD-10-CM | POA: Diagnosis not present

## 2015-11-10 DIAGNOSIS — N179 Acute kidney failure, unspecified: Secondary | ICD-10-CM | POA: Diagnosis not present

## 2015-11-10 DIAGNOSIS — E870 Hyperosmolality and hypernatremia: Secondary | ICD-10-CM

## 2015-11-10 DIAGNOSIS — N183 Chronic kidney disease, stage 3 unspecified: Secondary | ICD-10-CM | POA: Insufficient documentation

## 2015-11-10 DIAGNOSIS — Z794 Long term (current) use of insulin: Secondary | ICD-10-CM

## 2015-11-10 NOTE — Assessment & Plan Note (Signed)
-   Likely due to sodium tablets - Sodium tablets stopped SNF - Recheck BMP  next week

## 2015-11-10 NOTE — Progress Notes (Signed)
MRN: [id number] Name: Travis Coleman  Sex: male Age: 81 y.o. DOB: 03-06-1932  PSC #: Ronni Rumble Facility/Room:129A Level Of Care: SNF Provider: Merrilee Seashore D Emergency Contacts: Extended Emergency Contact Information Primary Emergency Contact: Wendelyn Breslow [place] of [place] Home Phone: [phone] Mobile Phone: [phone] Relation: Sister  Code Status:   Allergies: Review of patient's allergies indicates no known allergies.  Chief Complaint  Patient presents with  . New Admit To SNF    HPI: Patient is 81 y.o. male with diabetes, dyslipidemia, hypertension, dementia who presented to [facility] from SNF with fever and diarrhea. Pt was admitted to [facility] from 12/14-19 for diarrhea, not C Diff. Hospital course was complicated by bradycardia, hypernatremia, hypomagnesemia and thrombocytopenia, all corrected. Pt is admitted to SNF for generalized weakness for OT/PT. While at SNF pt will be followed for HTN, tx with vasotec and hydralazine,dementia, tx with aricept and depakote, and HLD, tx with lipitor.  Past Medical History  Diagnosis Date  . Diabetes mellitus   . Hyperlipidemia   . Hypertension   . CAD (coronary artery disease) of bypass graft     Multivessel  . Dementia     MMSE 18/30 Feb 2012 [facility])  . PVD (peripheral vascular disease) (HCC)   . Cataract     bilateral  . Hearing deficit   . CHF (congestive heart failure) [facility])     Past Surgical History  Procedure Laterality Date  . Coronary artery bypass graft        Medication List       This list is accurate as of: 11/10/15 11:59 PM.  Always use your most recent med list.               acetaminophen 500 MG tablet  Commonly known as:  TYLENOL  Take 1 tablet (500 mg total) by mouth every 6 (six) hours as needed.     albuterol (2.5 MG/3ML) 0.083% nebulizer solution  Commonly known as:  PROVENTIL  Take 3 mLs (2.5 mg total) by nebulization every 6 (six) hours as needed for wheezing or shortness of  breath.     aspirin EC 81 MG tablet  Take 81 mg by mouth daily.     atorvastatin 20 MG tablet  Commonly known as:  LIPITOR  Take 20 mg by mouth at bedtime.     cholecalciferol 1000 UNITS tablet  Commonly known as:  VITAMIN D  Take 2,000 Units by mouth daily.     divalproex 125 MG capsule  Commonly known as:  DEPAKOTE SPRINKLE  Take 250 mg by mouth 3 (three) times daily.     donepezil 10 MG tablet  Commonly known as:  ARICEPT  Take 10 mg by mouth at bedtime.     enalapril 10 MG tablet  Commonly known as:  VASOTEC  Take 10 mg by mouth daily.     ferrous sulfate 325 (65 FE) MG tablet  Take 325 mg by mouth daily with breakfast.     hydrALAZINE 10 MG tablet  Commonly known as:  APRESOLINE  Take 10 mg by mouth 3 (three) times daily.     insulin aspart 100 UNIT/ML injection  Commonly known as:  novoLOG  Inject 5 Units into the skin 3 (three) times daily before meals. For cbg >=150     SILTUSSIN DAS 100 MG/5ML liquid  Generic drug:  guaiFENesin  Take 200 mg by mouth every 6 (six) hours as needed for cough.        No orders of  the defined types were placed in this encounter.    Immunization History  Administered Date(s) Administered  . Influenza,inj,Quad PF,36+ Mos 12/06/2013  . Pneumococcal Polysaccharide-23 12/06/2013    Social History  Substance Use Topics  . Smoking status: Former Smoker    Quit date: 11/19/1948  . Smokeless tobacco: Never Used  . Alcohol Use: No    Family history is +DM,HD   Review of Systems  DATA OBTAINED: from patient, nurse GENERAL:  no fevers, fatigue, appetite changes SKIN: No itching, rash or wounds EYES: No eye pain, redness, discharge EARS: No earache, tinnitus, change in hearing NOSE: No congestion, drainage or bleeding  MOUTH/THROAT: No mouth or tooth pain, No sore throat RESPIRATORY: No cough, wheezing, SOB CARDIAC: No chest pain, palpitations, lower extremity edema  GI: No abdominal pain, No N/V/D or constipation, No  heartburn or reflux  GU: No dysuria, frequency or urgency, or incontinence  MUSCULOSKELETAL: No unrelieved bone/joint pain NEUROLOGIC: No headache, dizziness or focal weakness PSYCHIATRIC: No c/o anxiety or sadness   Filed Vitals:   11/14/15 1128  BP: 112/68  Pulse: 76  Temp: 95.6 F (35.3 C)  Resp: 18    SpO2 Readings from Last 1 Encounters:  11/14/15 98%        Physical Exam  GENERAL APPEARANCE: Alert, conversant,  No acute distress.  SKIN: No diaphoresis rash HEAD: Normocephalic, atraumatic  EYES: Conjunctiva/lids clear. Pupils round, reactive. EOMs intact.  EARS: External exam WNL, canals clear. Hearing grossly normal.  NOSE: No deformity or discharge.  MOUTH/THROAT: Lips w/o lesions  RESPIRATORY: Breathing is even, unlabored. Lung sounds are clear   CARDIOVASCULAR: Heart RRR no murmurs, rubs or gallops. No peripheral edema.   GASTROINTESTINAL: Abdomen is soft, non-tender, not distended w/ normal bowel sounds. GENITOURINARY: Bladder non tender, not distended  MUSCULOSKELETAL: No abnormal joints or musculature NEUROLOGIC:  Cranial nerves 2-12 grossly intact. Moves all extremities  PSYCHIATRIC: Mood and affect appropriate to situation, no behavioral issues  Patient Active Problem List   Diagnosis Date Noted  . Hypomagnesemia 11/10/2015  . Acute renal failure superimposed on stage 3 chronic kidney disease (HCC) 11/10/2015  . Anemia in chronic kidney disease 11/10/2015  . Hyperlipidemia 11/10/2015  . Hypertensive heart disease with CHF (congestive heart failure) (HCC) 11/08/2015  . Chronic systolic CHF (congestive heart failure) (HCC) 11/08/2015  . Vascular dementia without behavioral disturbance 11/08/2015  . Dyslipidemia associated with type 2 diabetes mellitus (HCC) 11/08/2015  . Controlled type 2 diabetes mellitus with diabetic nephropathy, with long-term current use of insulin (HCC)   . Infectious diarrhea in adult patient 11/03/2015  . AKI (acute kidney  injury) (HCC) 11/03/2015  . Hypoglycemia 11/03/2015  . Hypernatremia 11/03/2015  . SIRS (systemic inflammatory response syndrome) (HCC) 11/03/2015  . Bronchospasm   . Sepsis, unspecified organism (HCC)   . Bradycardia   . Syncope 12/07/2013  . Fluid overload 12/05/2013  . Altered mental status 12/04/2013  . Diabetes mellitus type 2 with complications (HCC) 01/03/2011  . PVD (peripheral vascular disease) (HCC) 01/03/2011  . CAD (coronary artery disease) s/p CABG 01/03/2011  . Person living in residential institution 01/03/2011    CBC    Component Value Date/Time   WBC 8.5 11/06/2015 0518   RBC 3.49* 11/06/2015 0518   HGB 11.0* 11/06/2015 0518   HCT 33.7* 11/06/2015 0518   PLT 149* 11/06/2015 0518   MCV 96.6 11/06/2015 0518   LYMPHSABS 2.2 11/04/2015 0340   MONOABS 0.6 11/04/2015 0340   EOSABS 0.3 11/04/2015 0340  BASOSABS 0.0 11/04/2015 0340    CMP     Component Value Date/Time   NA 151* 11/06/2015 0518   K 3.6 11/06/2015 0518   CL 125* 11/06/2015 0518   CO2 19* 11/06/2015 0518   GLUCOSE 161* 11/06/2015 0518   BUN 17 11/06/2015 0518   CREATININE 1.05 11/06/2015 0518   CALCIUM 8.2* 11/06/2015 0518   PROT 5.7* 11/04/2015 0340   ALBUMIN 2.8* 11/04/2015 0340   AST 32 11/04/2015 0340   ALT 27 11/04/2015 0340   ALKPHOS 50 11/04/2015 0340   BILITOT 0.8 11/04/2015 0340   GFRNONAA >60 11/06/2015 0518   GFRAA >60 11/06/2015 0518    Lab Results  Component Value Date   HGBA1C 6.2* 12/04/2013     Ct Abdomen Pelvis Wo Contrast  11/03/2015  CLINICAL DATA:  Acute onset of diarrhea and generalized abdominal distention. Initial encounter. EXAM: CT ABDOMEN AND PELVIS WITHOUT CONTRAST TECHNIQUE: Multidetector CT imaging of the abdomen and pelvis was performed following the standard protocol without IV contrast. COMPARISON:  None. FINDINGS: Mild bibasilar atelectasis is noted. The patient is status post median sternotomy. Diffuse coronary artery calcifications are seen. The  liver and spleen are unremarkable in appearance. The gallbladder is within normal limits. The pancreas and adrenal glands are unremarkable. Mild bilateral renal atrophy is noted. Mild perinephric stranding is noted bilaterally. There is no evidence of hydronephrosis. No renal or ureteral stones are seen. The small bowel is unremarkable in appearance. The stomach is within normal limits. No acute vascular abnormalities are seen. Diffuse calcification is noted along the abdominal aorta and its branches, including along the superior mesenteric artery, inferior mesenteric artery and at the proximal renal arteries bilaterally. The appendix is not definitely seen; there is no evidence for appendicitis. The colon is unremarkable in appearance. Trace fluid at the right lower quadrant is nonspecific. The bladder is mildly distended and grossly unremarkable. The patient is status post hysterectomy. No suspicious adnexal masses are seen. No inguinal lymphadenopathy is seen. No acute osseous abnormalities are identified. IMPRESSION: 1. No definite acute abnormality seen within the abdomen or pelvis. 2. Trace fluid at the right lower quadrant is nonspecific. 3. Diffuse calcification along the abdominal aorta and its branches, including along the superior mesenteric artery, inferior mesenteric artery and at the proximal renal arteries bilaterally. 4. Mild bibasilar atelectasis noted. 5. Diffuse coronary artery calcifications seen. 6. Mild bilateral renal atrophy noted. Electronically Signed   By: Roanna RaiderJeffery  Chang M.D.   On: 11/03/2015 04:51   Dg Chest 2 View  11/02/2015  CLINICAL DATA:  Diarrhea.  Possible C difficile.  Dementia. EXAM: CHEST  2 VIEW COMPARISON:  06/28/2014 FINDINGS: Postoperative changes in the mediastinum. Shallow inspiration. Mild cardiac enlargement. No significant vascular congestion. No focal airspace disease or consolidation. No blunting of costophrenic angles. No pneumothorax. Mildly tortuous aorta.  IMPRESSION: Cardiac enlargement.  No evidence of active pulmonary disease. Electronically Signed   By: Burman NievesWilliam  Stevens M.D.   On: 11/02/2015 23:54   Dg Chest Port 1 View  11/03/2015  CLINICAL DATA:  Acute shortness of breath and wheezing. EXAM: PORTABLE CHEST 1 VIEW COMPARISON:  11/02/2015 FINDINGS: Postoperative changes in the mediastinum. Shallow inspiration with atelectasis in the lung bases. No focal consolidation in the lungs. No blunting of costophrenic angles. No pneumothorax. Cardiac enlargement without vascular congestion or edema. IMPRESSION: Shallow inspiration with atelectasis in the lung bases. Cardiac enlargement. No evidence of active pulmonary disease. Electronically Signed   By: Burman NievesWilliam  Stevens M.D.   On:  11/03/2015 03:10    Not all labs, radiology exams or other studies done during hospitalization come through on my EPIC note; however they are reviewed by me.    Assessment and Plan  Infectious diarrhea in adult patient Stool for C.diff negative - No diarrhea in past 24 hours - GI pathogen panel pending as of 12/19 - Blood cultures negative so far  SNF - advance diet as tolerated  Bradycardia Likely demand ischemia from chronic renal failure  - Trop level stabilized and improved to 0.4 - No further work up required per cardiology - Continue daily aspirin - 2 D ECHO on this admission with EF 55% with grade 1 diastolic dysfunction  SNF -= cont ASA 81 mg daily  Hypernatremia - Likely due to sodium tablets - Sodium tablets stopped SNF - Recheck BMP  next week   Hypomagnesemia - Due to GI losses - Supplemented - Magnesium level WNL SNF - will recheck next week  Acute renal failure superimposed on stage 3 chronic kidney disease (HCC) SNF - reported Cr returned to baseline, last Cr 1.57; will recheck BMP  Anemia in chronic kidney disease SNF - Hb reported stable at 11;will recheck next week  Hypertensive heart disease with CHF (congestive heart failure)  (HCC) SNF- Continue hydralazine 10 mg TID Continue Vasotec 10 mg daily  Controlled type 2 diabetes mellitus with diabetic nephropathy, with long-term current use of insulin (HCC) Continue novolog 3 units tid along with SSI   CBG 121 - 150: 1 unit     CBG 151 - 200: 2 units     CBG 201 - 250: 3 units     CBG 251 - 300: 5 units     CBG 301 - 350: 7 units     CBG 351 - 400 9 units            SNF - cont insulin as above; pt is on statin and ACE   Vascular dementia without behavioral disturbance SNF - not reported as unstable; cont aricept and depakote  Hyperlipidemia SNF - not reported as unstable;cont lipitor 20 mg   Time spent > 45 min;> 50% of time with patient was spent reviewing records, labs, tests and studies, counseling and developing plan of care  Margit Hanks, MD

## 2015-11-10 NOTE — Assessment & Plan Note (Signed)
SNF - Hb reported stable at 11;will recheck next week

## 2015-11-10 NOTE — Assessment & Plan Note (Signed)
SNF - reported Cr returned to baseline, last Cr 1.57; will recheck BMP

## 2015-11-10 NOTE — Assessment & Plan Note (Signed)
SNF- Continue hydralazine 10 mg TID Continue Vasotec 10 mg daily

## 2015-11-10 NOTE — Assessment & Plan Note (Signed)
Likely demand ischemia from chronic renal failure  - Trop level stabilized and improved to 0.4 - No further work up required per cardiology - Continue daily aspirin - 2 D ECHO on this admission with EF 55% with grade 1 diastolic dysfunction  SNF -= cont ASA 81 mg daily

## 2015-11-10 NOTE — Assessment & Plan Note (Signed)
Continue novolog 3 units tid along with SSI   CBG 121 - 150: 1 unit     CBG 151 - 200: 2 units     CBG 201 - 250: 3 units     CBG 251 - 300: 5 units     CBG 301 - 350: 7 units     CBG 351 - 400 9 units            SNF - cont insulin as above; pt is on statin and ACE

## 2015-11-10 NOTE — Assessment & Plan Note (Signed)
-   Due to GI losses - Supplemented - Magnesium level WNL SNF - will recheck next week

## 2015-11-10 NOTE — Assessment & Plan Note (Signed)
SNF - not reported as unstable; cont aricept and depakote

## 2015-11-10 NOTE — Assessment & Plan Note (Signed)
SNF - not reported as unstable;cont lipitor 20 mg

## 2015-11-10 NOTE — Assessment & Plan Note (Signed)
Stool for C.diff negative - No diarrhea in past 24 hours - GI pathogen panel pending as of 12/19 - Blood cultures negative so far  SNF - advance diet as tolerated

## 2015-11-14 ENCOUNTER — Encounter: Payer: Self-pay | Admitting: Internal Medicine

## 2015-11-17 LAB — BASIC METABOLIC PANEL
BUN: 18 mg/dL (ref 4–21)
Creatinine: 0.9 mg/dL (ref 0.6–1.3)
Glucose: 160 mg/dL
POTASSIUM: 3.9 mmol/L (ref 3.4–5.3)
Sodium: 143 mmol/L (ref 137–147)

## 2015-11-17 LAB — CBC AND DIFFERENTIAL
HCT: 39 % — AB (ref 41–53)
HEMOGLOBIN: 12.4 g/dL — AB (ref 13.5–17.5)
PLATELETS: 185 10*3/uL (ref 150–399)
WBC: 6.3 10*3/mL

## 2015-12-06 ENCOUNTER — Non-Acute Institutional Stay (SKILLED_NURSING_FACILITY): Payer: Medicare Other | Admitting: Adult Health

## 2015-12-06 DIAGNOSIS — F0150 Vascular dementia without behavioral disturbance: Secondary | ICD-10-CM

## 2015-12-06 DIAGNOSIS — I5022 Chronic systolic (congestive) heart failure: Secondary | ICD-10-CM | POA: Diagnosis not present

## 2015-12-06 DIAGNOSIS — E1121 Type 2 diabetes mellitus with diabetic nephropathy: Secondary | ICD-10-CM | POA: Diagnosis not present

## 2015-12-06 DIAGNOSIS — D631 Anemia in chronic kidney disease: Secondary | ICD-10-CM

## 2015-12-06 DIAGNOSIS — Z794 Long term (current) use of insulin: Secondary | ICD-10-CM

## 2015-12-06 DIAGNOSIS — N189 Chronic kidney disease, unspecified: Secondary | ICD-10-CM | POA: Diagnosis not present

## 2015-12-06 DIAGNOSIS — J9801 Acute bronchospasm: Secondary | ICD-10-CM | POA: Diagnosis not present

## 2015-12-06 DIAGNOSIS — E785 Hyperlipidemia, unspecified: Secondary | ICD-10-CM | POA: Diagnosis not present

## 2015-12-06 DIAGNOSIS — E1169 Type 2 diabetes mellitus with other specified complication: Secondary | ICD-10-CM | POA: Diagnosis not present

## 2015-12-06 DIAGNOSIS — I110 Hypertensive heart disease with heart failure: Secondary | ICD-10-CM

## 2015-12-20 ENCOUNTER — Encounter: Payer: Self-pay | Admitting: Adult Health

## 2015-12-20 NOTE — Progress Notes (Signed)
Patient ID: Travis Coleman, male   DOB: 1932/10/06, 82 y.o.   MRN: [id number]    Facility:  [facility]       No Known Allergies  Chief Complaint  Patient presents with  . Medical Management of Chronic Issues    HPI:  He is a resident of this facility being seen for the management of his chronic illnesses. He is unable to fully participate in the hpi or ros; but did tell me that he is feeling good today. There are no nursing concerns at this time. There is no significant change in his overall status.   Past Medical History  Diagnosis Date  . Diabetes mellitus   . Hyperlipidemia   . Hypertension   . CAD (coronary artery disease) of bypass graft     Multivessel  . Dementia     MMSE 18/30 Feb 2012 [facility])  . PVD (peripheral vascular disease) (HCC)   . Cataract     bilateral  . Hearing deficit   . CHF (congestive heart failure) [facility])     Past Surgical History  Procedure Laterality Date  . Coronary artery bypass graft      VITAL SIGNS BP 112/68 mmHg  Pulse 76  Ht  (1.727 m)  Wt 209 lb (94.802 kg)  BMI 31.79 kg/m2  Patient's Medications  New Prescriptions   No medications on file  Previous Medications   ACETAMINOPHEN (TYLENOL) 500 MG TABLET    Take 1 tablet (500 mg total) by mouth every 6 (six) hours as needed.   ALBUTEROL (PROVENTIL) (2.5 MG/3ML) 0.083% NEBULIZER SOLUTION    Take 3 mLs (2.5 mg total) by nebulization every 6 (six) hours as needed for wheezing or shortness of breath.   ASPIRIN EC 81 MG TABLET    Take 81 mg by mouth daily.   ATORVASTATIN (LIPITOR) 20 MG TABLET    Take 20 mg by mouth at bedtime.   CHOLECALCIFEROL (VITAMIN D) 1000 UNITS TABLET    Take 2,000 Units by mouth daily.   DIVALPROEX (DEPAKOTE SPRINKLE) 125 MG CAPSULE    Take 250 mg by mouth 3 (three) times daily.    DONEPEZIL (ARICEPT) 10 MG TABLET    Take 10 mg by mouth at bedtime.   ENALAPRIL (VASOTEC) 10 MG TABLET    Take 10 mg by mouth daily.   FERROUS SULFATE 325 (65 FE) MG TABLET     Take 325 mg by mouth daily with breakfast.   GUAIFENESIN (SILTUSSIN DAS) 100 MG/5ML LIQUID    Take 200 mg by mouth every 6 (six) hours as needed for cough.   HYDRALAZINE (APRESOLINE) 10 MG TABLET    Take 10 mg by mouth 3 (three) times daily.   INSULIN ASPART (NOVOLOG) 100 UNIT/ML INJECTION    Inject 5 Units into the skin 3 (three) times daily before meals. For cbg >=150   MEMANTINE (NAMENDA XR) 7 MG CP24 24 HR CAPSULE    Take 7 mg by mouth daily.  Modified Medications   No medications on file  Discontinued Medications   No medications on file     SIGNIFICANT DIAGNOSTIC EXAMS   11-02-15: chest x-ray: Cardiac enlargement.  No evidence of active pulmonary disease.  11-03-15: chest x-ray; Shallow inspiration with atelectasis in the lung bases. Cardiac enlargement. No evidence of active pulmonary disease.   11-03-15: ct of abdomen and pelvis: 1. No definite acute abnormality seen within the abdomen or pelvis. 2. Trace fluid at the right lower quadrant is nonspecific. 3. Diffuse  calcification along the abdominal aorta and its branches, including along the superior mesenteric artery, inferior mesenteric artery and at the proximal renal arteries bilaterally. 4. Mild bibasilar atelectasis noted. 5. Diffuse coronary artery calcifications seen. 6. Mild bilateral renal atrophy noted.  11-04-15: 2-d echo: Left ventricle: The cavity size was normal. There was mild concentric hypertrophy. Systolic function was normal. The estimated ejection fraction was in the range of 50% to 55%. Wall motion was normal; there were no regional wall motion abnormalities. Doppler parameters are consistent with abnormal left ventricular relaxation (grade 1 diastolic dysfunction). - Aortic valve: Valve mobility was restricted. Transvalvular velocity was increased, due to stenosis. There was mild stenosis. - Mitral valve: Calcified annulus. There was mild regurgitation. - Left atrium: The atrium was mildly dilated. -  Right atrium: The atrium was mildly dilated.     LABS REVIEWED:   11-02-15: wbc 4.7; hgb 13.2; hct 40.8; mcv 98.8; plt 167; glucose 60; bun 49; creat 1.90; k+ 4.2; na++146; liver normal albumin 4.0; lipase 20; tsh 0.614: urine culture: no growth: c-diff: neg  GI pathogen panel: negative 11-04-15: wbc 6.8; hgb 11.3; hct 34.1; mcv 98.3; plt 138; glucose 114; bun 32; creat 1.57; k+ 3.8; na++146; liver normal albumin 2.8 11-05-15: wbc 7.6; hgb 11.0; hct 33.4; mcv 96.5; plt 141; glucose 145; bun 21; creat 1.18; k+ 4.0; na++147; mag 1.5  11-06-15: wbc 8.5; hgb 11.0; hct 33.7; mcv 96.6; plt 149; glucose 161; bun 17; creat 1.05; k+ 3.6; na++151  11-17-15: wbc 6.3; hgb 12.4; hct 39.4; mcv 95.7; plt 185    Review of Systems  Unable to perform ROS: dementia    Physical Exam  Constitutional: No distress.  Overweight   Eyes: Conjunctivae are normal.  Neck: Neck supple. No JVD present. No thyromegaly present.  Cardiovascular: Normal rate, regular rhythm and intact distal pulses.   Respiratory: Effort normal and breath sounds normal. No respiratory distress. He has no wheezes.  GI: Soft. Bowel sounds are normal. He exhibits no distension. There is no tenderness.  Musculoskeletal: He exhibits no edema.  Able to move all extremities   Lymphadenopathy:    He has no cervical adenopathy.  Neurological: He is alert.  Skin: Skin is warm and dry. He is not diaphoretic.  Psychiatric: He has a normal mood and affect.       ASSESSMENT/ PLAN:  1. Dyslipidemia: will continue lipitor 20 mg daily   2. Anemia of chronic disease: hgb is 12.4; will continue iron daily   3. Hypertension: will continue vasotec 10 mg daily hydralazine 10 mg three times daily will monitor Has calcification in bilateral renal arteries   4. CAD: status post cabg: no complaint of chest pain present; will continue asa 81 mg daily   5. Vascular dementia: no change in status; his current weight is 209 pounds; will continue  aricept 10 mg nightly will continue depakote sprinkles 250 mg three times daily to stabilize mood   6. Diabetes: will continue novolog 5 units with meals for cbg >=150  7. Bronchospasm: is stable will continue albuterol neb every 6 hours as needed and robitussin 200 mg every 6 hours as needed  8. Hypernatremia: his sodium tablets have been stopped; will monitor  9. CHF: he is presently stable will continue hydralazine 10 mg three times daily is presently not on diuretic will monitor        Synthia Innocent NP [facility]  Contact [phone] Monday through Friday 8am- 5pm  After hours call [phone]

## 2016-01-11 ENCOUNTER — Non-Acute Institutional Stay (SKILLED_NURSING_FACILITY): Payer: Medicare Other | Admitting: Adult Health

## 2016-01-11 ENCOUNTER — Encounter: Payer: Self-pay | Admitting: Adult Health

## 2016-01-11 DIAGNOSIS — E1121 Type 2 diabetes mellitus with diabetic nephropathy: Secondary | ICD-10-CM | POA: Diagnosis not present

## 2016-01-11 DIAGNOSIS — E1169 Type 2 diabetes mellitus with other specified complication: Secondary | ICD-10-CM

## 2016-01-11 DIAGNOSIS — I5022 Chronic systolic (congestive) heart failure: Secondary | ICD-10-CM

## 2016-01-11 DIAGNOSIS — Z794 Long term (current) use of insulin: Secondary | ICD-10-CM | POA: Diagnosis not present

## 2016-01-11 DIAGNOSIS — I110 Hypertensive heart disease with heart failure: Secondary | ICD-10-CM

## 2016-01-11 DIAGNOSIS — I25709 Atherosclerosis of coronary artery bypass graft(s), unspecified, with unspecified angina pectoris: Secondary | ICD-10-CM | POA: Diagnosis not present

## 2016-01-11 DIAGNOSIS — E785 Hyperlipidemia, unspecified: Secondary | ICD-10-CM

## 2016-01-11 DIAGNOSIS — F0150 Vascular dementia without behavioral disturbance: Secondary | ICD-10-CM

## 2016-01-11 NOTE — Progress Notes (Signed)
Patient ID: Travis Coleman, male   DOB: 09-16-1932, 82 y.o.   MRN: [id number]   Facility: [facility]       No Known Allergies  Chief Complaint  Patient presents with  . Medical Management of Chronic Issues    Follow up    HPI:  He is a long term resident of this facility being seen for the management of his chronic illnesses. His cbg's are elevated. He is unable to fully participate in the hpi or ros. There are no nursing concerns at this time.    Past Medical History  Diagnosis Date  . Diabetes mellitus   . Hyperlipidemia   . Hypertension   . CAD (coronary artery disease) of bypass graft     Multivessel  . Dementia     MMSE 18/30 Feb 2012 [facility])  . PVD (peripheral vascular disease) (HCC)   . Cataract     bilateral  . Hearing deficit   . CHF (congestive heart failure) [facility])     Past Surgical History  Procedure Laterality Date  . Coronary artery bypass graft      VITAL SIGNS BP 161/76 mmHg  Pulse 65  Temp(Src) 95.6 F (35.3 C) (Oral)  Resp 18  Ht  (1.727 m)  Wt 180 lb (81.647 kg)  BMI 27.38 kg/m2  SpO2 98%  Patient's Medications  New Prescriptions   No medications on file  Previous Medications   ACETAMINOPHEN (TYLENOL) 500 MG TABLET    Take 1 tablet (500 mg total) by mouth every 6 (six) hours as needed.   ALBUTEROL (PROVENTIL) (2.5 MG/3ML) 0.083% NEBULIZER SOLUTION    Take 3 mLs (2.5 mg total) by nebulization every 6 (six) hours as needed for wheezing or shortness of breath.   ASPIRIN EC 81 MG TABLET    Take 81 mg by mouth daily.   ATORVASTATIN (LIPITOR) 20 MG TABLET    Take 20 mg by mouth at bedtime.   CHOLECALCIFEROL (VITAMIN D) 1000 UNITS TABLET    Take 2,000 Units by mouth daily.   DIVALPROEX (DEPAKOTE SPRINKLE) 125 MG CAPSULE    Take 250 mg by mouth 3 (three) times daily.    DONEPEZIL (ARICEPT) 10 MG TABLET    Take 10 mg by mouth at bedtime.   ENALAPRIL (VASOTEC) 10 MG TABLET    Take 10 mg by mouth daily.   FERROUS SULFATE 325 (65 FE) MG  TABLET    Take 325 mg by mouth daily with breakfast.   GUAIFENESIN (SILTUSSIN DAS) 100 MG/5ML LIQUID    Take 200 mg by mouth every 6 (six) hours as needed for cough. Reported on 01/11/2016   HYDRALAZINE (APRESOLINE) 10 MG TABLET    Take 10 mg by mouth 3 (three) times daily.   INSULIN ASPART (NOVOLOG) 100 UNIT/ML INJECTION    Inject 5 Units into the skin 3 (three) times daily before meals. For cbg >=150   MEMANTINE (NAMENDA XR) 7 MG CP24 24 HR CAPSULE    Take 7 mg by mouth daily.  Modified Medications   No medications on file  Discontinued Medications   No medications on file     SIGNIFICANT DIAGNOSTIC EXAMS  11-02-15: chest x-ray: Cardiac enlargement.  No evidence of active pulmonary disease.  11-03-15: chest x-ray; Shallow inspiration with atelectasis in the lung bases. Cardiac enlargement. No evidence of active pulmonary disease.   11-03-15: ct of abdomen and pelvis: 1. No definite acute abnormality seen within the abdomen or pelvis. 2. Trace fluid at the right  lower quadrant is nonspecific. 3. Diffuse calcification along the abdominal aorta and its branches, including along the superior mesenteric artery, inferior mesenteric artery and at the proximal renal arteries bilaterally. 4. Mild bibasilar atelectasis noted. 5. Diffuse coronary artery calcifications seen. 6. Mild bilateral renal atrophy noted.  11-04-15: 2-d echo: Left ventricle: The cavity size was normal. There was mild concentric hypertrophy. Systolic function was normal. The estimated ejection fraction was in the range of 50% to 55%. Wall motion was normal; there were no regional wall motion abnormalities. Doppler parameters are consistent with abnormal left ventricular relaxation (grade 1 diastolic dysfunction). - Aortic valve: Valve mobility was restricted. Transvalvular velocity was increased, due to stenosis. There was mild stenosis. - Mitral valve: Calcified annulus. There was mild regurgitation. - Left atrium: The  atrium was mildly dilated. - Right atrium: The atrium was mildly dilated.     LABS REVIEWED:   11-02-15: wbc 4.7; hgb 13.2; hct 40.8; mcv 98.8; plt 167; glucose 60; bun 49; creat 1.90; k+ 4.2; na++146; liver normal albumin 4.0; lipase 20; tsh 0.614: urine culture: no growth: c-diff: neg  GI pathogen panel: negative 11-04-15: wbc 6.8; hgb 11.3; hct 34.1; mcv 98.3; plt 138; glucose 114; bun 32; creat 1.57; k+ 3.8; na++146; liver normal albumin 2.8 11-05-15: wbc 7.6; hgb 11.0; hct 33.4; mcv 96.5; plt 141; glucose 145; bun 21; creat 1.18; k+ 4.0; na++147; mag 1.5  11-06-15: wbc 8.5; hgb 11.0; hct 33.7; mcv 96.6; plt 149; glucose 161; bun 17; creat 1.05; k+ 3.6; na++151  11-17-15: wbc 6.3; hgb 12.4; hct 39.4; mcv 95.7; plt 185    Review of Systems  Unable to perform ROS: dementia    Physical Exam  Constitutional: No distress.  Overweight   Eyes: Conjunctivae are normal.  Neck: Neck supple. No JVD present. No thyromegaly present.  Cardiovascular: Normal rate, regular rhythm and intact distal pulses.   Respiratory: Effort normal and breath sounds normal. No respiratory distress. He has no wheezes.  GI: Soft. Bowel sounds are normal. He exhibits no distension. There is no tenderness.  Musculoskeletal: He exhibits no edema.  Able to move all extremities   Lymphadenopathy:    He has no cervical adenopathy.  Neurological: He is alert.  Skin: Skin is warm and dry. He is not diaphoretic.  Psychiatric: He has a normal mood and affect.       ASSESSMENT/ PLAN:  1. Dyslipidemia: will continue lipitor 20 mg daily   2. Anemia of chronic disease: hgb is 12.4; will continue iron daily   3. Hypertension: will continue vasotec 10 mg daily hydralazine 10 mg three times daily will monitor Has calcification in bilateral renal arteries   4. CAD: status post cabg: no complaint of chest pain present; will continue asa 81 mg daily   5. Vascular dementia: no change in status; his current weight is  209 pounds; will continue aricept 10 mg nightly will continue depakote sprinkles 250 mg three times daily to stabilize mood   6. Diabetes: his cbg's are elevated; will begin lantus 5 units nightly; will begin tradjenta 5 mg daily; and will change novolog to 10 units with meals   7. Bronchospasm: is stable will continue albuterol neb every 6 hours as needed and robitussin 200 mg every 6 hours as needed  8. Hypernatremia: his sodium tablets have been stopped; will monitor  9. CHF: he is presently stable will continue hydralazine 10 mg three times daily is presently not on diuretic will monitor     Will  check hgb a1c lipids; tsh; urine for micro-albumin          Synthia Innocent NP [facility]  Contact [phone] Monday through Friday 8am- 5pm  After hours call [phone]

## 2016-02-02 ENCOUNTER — Encounter: Payer: Self-pay | Admitting: Internal Medicine

## 2016-02-02 ENCOUNTER — Non-Acute Institutional Stay (SKILLED_NURSING_FACILITY): Payer: Medicare Other | Admitting: Internal Medicine

## 2016-02-02 DIAGNOSIS — L89622 Pressure ulcer of left heel, stage 2: Secondary | ICD-10-CM | POA: Diagnosis not present

## 2016-02-02 NOTE — Progress Notes (Signed)
MRN: [id number] Name: Travis Coleman  Sex: male Age: 82 y.o. DOB: 1932/09/22  PSC #: Ronni Rumble Facility/Room:  Level Of Care: SNF Provider: Merrilee Seashore D Emergency Contacts: Extended Emergency Contact Information Primary Emergency Contact: Wendelyn Breslow [place] of [place] Home Phone: [phone] Mobile Phone: [phone] Relation: Sister  Code Status:   Allergies: Review of patient's allergies indicates no known allergies.  Chief Complaint  Patient presents with  . Acute Visit    HPI: Patient is 82 y.o. male who i am seeing acutely because pt has pus draining from his heel for some unknown length of time, just noticed today by pt. No fever MS change or other systemix sx.  Past Medical History  Diagnosis Date  . Diabetes mellitus   . Hyperlipidemia   . Hypertension   . CAD (coronary artery disease) of bypass graft     Multivessel  . Dementia     MMSE 18/30 Feb 2012 [facility])  . PVD (peripheral vascular disease) (HCC)   . Cataract     bilateral  . Hearing deficit   . CHF (congestive heart failure) [facility])     Past Surgical History  Procedure Laterality Date  . Coronary artery bypass graft        Medication List       This list is accurate as of: 02/02/16 11:59 PM.  Always use your most recent med list.               acetaminophen 500 MG tablet  Commonly known as:  TYLENOL  Take 1 tablet (500 mg total) by mouth every 6 (six) hours as needed.     albuterol (2.5 MG/3ML) 0.083% nebulizer solution  Commonly known as:  PROVENTIL  Take 3 mLs (2.5 mg total) by nebulization every 6 (six) hours as needed for wheezing or shortness of breath.     aspirin EC 81 MG tablet  Take 81 mg by mouth daily.     atorvastatin 20 MG tablet  Commonly known as:  LIPITOR  Take 20 mg by mouth at bedtime.     cholecalciferol 1000 units tablet  Commonly known as:  VITAMIN D  Take 2,000 Units by mouth daily.     divalproex 125 MG capsule  Commonly known as:   DEPAKOTE SPRINKLE  Take 250 mg by mouth 3 (three) times daily.     donepezil 10 MG tablet  Commonly known as:  ARICEPT  Take 10 mg by mouth at bedtime.     enalapril 10 MG tablet  Commonly known as:  VASOTEC  Take 10 mg by mouth daily.     ferrous sulfate 325 (65 FE) MG tablet  Take 325 mg by mouth daily with breakfast.     hydrALAZINE 10 MG tablet  Commonly known as:  APRESOLINE  Take 10 mg by mouth 3 (three) times daily.     insulin aspart 100 UNIT/ML injection  Commonly known as:  novoLOG  Inject 10 Units into the skin 3 (three) times daily before meals. For cbg >=150     NAMENDA XR 7 MG Cp24 24 hr capsule  Generic drug:  memantine  Take 7 mg by mouth daily. Reported on 02/08/2016     SILTUSSIN DAS 100 MG/5ML liquid  Generic drug:  guaiFENesin  Take 200 mg by mouth every 6 (six) hours as needed for cough. Reported on 01/11/2016        No orders of the defined types were placed in this encounter.    Immunization  History  Administered Date(s) Administered  . Influenza,inj,Quad PF,36+ Mos 12/06/2013  . Influenza-Unspecified 12/28/2015  . Pneumococcal Polysaccharide-23 12/06/2013    Social History  Substance Use Topics  . Smoking status: Former Smoker    Quit date: 11/19/1948  . Smokeless tobacco: Never Used  . Alcohol Use: No    Review of Systems  DATA OBTAINED: from patient, nurse, sister GENERAL:  no fevers, fatigue, appetite changes SKIN: No itching, rash HEENT: No complaint RESPIRATORY: No cough, wheezing, SOB CARDIAC: No chest pain, palpitations, lower extremity edema  GI: No abdominal pain, No N/V/D or constipation, No heartburn or reflux  GU: No dysuria, frequency or urgency, or incontinence  MUSCULOSKELETAL: No unrelieved bone/joint pain, place on heel sticking to sock today NEUROLOGIC: No headache, dizziness  PSYCHIATRIC: No overt anxiety or sadness  Filed Vitals:   02/02/16 1459  BP: 112/48  Pulse: 65  Temp: 95.6 F (35.3 C)  Resp: 18     Physical Exam  GENERAL APPEARANCE: Alert, conversant, No acute distress  SKIN: No diaphoresis rash; wound L heel, stage 2 , no pus but area is infected with surrounding erythema and serosanguinous drainage HEENT: Unremarkable RESPIRATORY: Breathing is even, unlabored. Lung sounds are clear   CARDIOVASCULAR: Heart RRR no murmurs, rubs or gallops. No peripheral edema  GASTROINTESTINAL: Abdomen is soft, non-tender, not distended w/ normal bowel sounds.  GENITOURINARY: Bladder non tender, not distended  MUSCULOSKELETAL: No abnormal joints or musculature; see above  In skin NEUROLOGIC: Cranial nerves 2-12 grossly intact. Moves all extremities PSYCHIATRIC: Mood and affect appropriate to situation, no behavioral issues  Patient Active Problem List   Diagnosis Date Noted  . Hypomagnesemia 11/10/2015  . Acute renal failure superimposed on stage 3 chronic kidney disease (HCC) 11/10/2015  . Anemia in chronic kidney disease 11/10/2015  . Hyperlipidemia 11/10/2015  . Hypertensive heart disease with CHF (congestive heart failure) (HCC) 11/08/2015  . Chronic systolic CHF (congestive heart failure) (HCC) 11/08/2015  . Vascular dementia without behavioral disturbance 11/08/2015  . Dyslipidemia associated with type 2 diabetes mellitus (HCC) 11/08/2015  . Controlled type 2 diabetes mellitus with diabetic nephropathy, with long-term current use of insulin (HCC)   . Infectious diarrhea in adult patient 11/03/2015  . AKI (acute kidney injury) (HCC) 11/03/2015  . Hypoglycemia 11/03/2015  . Hypernatremia 11/03/2015  . SIRS (systemic inflammatory response syndrome) (HCC) 11/03/2015  . Bronchospasm   . Sepsis, unspecified organism (HCC)   . Bradycardia   . Syncope 12/07/2013  . Fluid overload 12/05/2013  . Altered mental status 12/04/2013  . Diabetes mellitus type 2 with complications (HCC) 01/03/2011  . PVD (peripheral vascular disease) (HCC) 01/03/2011  . CAD (coronary artery disease) s/p CABG  01/03/2011  . Person living in residential institution 01/03/2011    CBC    Component Value Date/Time   WBC 6.3 11/17/2015   WBC 8.5 11/06/2015 0518   RBC 3.49* 11/06/2015 0518   HGB 12.4* 11/17/2015   HCT 39* 11/17/2015   PLT 185 11/17/2015   MCV 96.6 11/06/2015 0518   LYMPHSABS 2.2 11/04/2015 0340   MONOABS 0.6 11/04/2015 0340   EOSABS 0.3 11/04/2015 0340   BASOSABS 0.0 11/04/2015 0340    CMP     Component Value Date/Time   NA 143 11/17/2015   NA 151* 11/06/2015 0518   K 3.9 11/17/2015   CL 125* 11/06/2015 0518   CO2 19* 11/06/2015 0518   GLUCOSE 161* 11/06/2015 0518   BUN 18 11/17/2015   BUN 17 11/06/2015 0518   CREATININE  0.9 11/17/2015   CREATININE 1.05 11/06/2015 0518   CALCIUM 8.2* 11/06/2015 0518   PROT 5.7* 11/04/2015 0340   ALBUMIN 2.8* 11/04/2015 0340   AST 32 11/04/2015 0340   ALT 27 11/04/2015 0340   ALKPHOS 50 11/04/2015 0340   BILITOT 0.8 11/04/2015 0340   GFRNONAA >60 11/06/2015 0518   GFRAA >60 11/06/2015 0518    Assessment and Plan  No problem-specific assessment & plan notes found for this encounter.  Time spent > 25 min;> 50% of time with patient was spent reviewing records, labs, tests and studies, counseling and developing plan of care  Margit HanksALEXANDER, Shakena Callari D, MD

## 2016-02-08 ENCOUNTER — Non-Acute Institutional Stay (SKILLED_NURSING_FACILITY): Payer: Medicare Other | Admitting: Adult Health

## 2016-02-08 ENCOUNTER — Encounter: Payer: Self-pay | Admitting: Adult Health

## 2016-02-08 DIAGNOSIS — I110 Hypertensive heart disease with heart failure: Secondary | ICD-10-CM

## 2016-02-08 DIAGNOSIS — E1165 Type 2 diabetes mellitus with hyperglycemia: Secondary | ICD-10-CM

## 2016-02-08 DIAGNOSIS — F0150 Vascular dementia without behavioral disturbance: Secondary | ICD-10-CM | POA: Diagnosis not present

## 2016-02-08 DIAGNOSIS — N189 Chronic kidney disease, unspecified: Secondary | ICD-10-CM | POA: Diagnosis not present

## 2016-02-08 DIAGNOSIS — E785 Hyperlipidemia, unspecified: Secondary | ICD-10-CM

## 2016-02-08 DIAGNOSIS — D631 Anemia in chronic kidney disease: Secondary | ICD-10-CM | POA: Diagnosis not present

## 2016-02-08 DIAGNOSIS — E1169 Type 2 diabetes mellitus with other specified complication: Secondary | ICD-10-CM

## 2016-02-08 DIAGNOSIS — E1121 Type 2 diabetes mellitus with diabetic nephropathy: Secondary | ICD-10-CM | POA: Diagnosis not present

## 2016-02-08 DIAGNOSIS — L89622 Pressure ulcer of left heel, stage 2: Secondary | ICD-10-CM

## 2016-02-08 DIAGNOSIS — I5022 Chronic systolic (congestive) heart failure: Secondary | ICD-10-CM

## 2016-02-08 DIAGNOSIS — IMO0002 Reserved for concepts with insufficient information to code with codable children: Secondary | ICD-10-CM

## 2016-02-08 NOTE — Progress Notes (Signed)
Patient ID: Travis Coleman, male   DOB: 1932-07-13, 82 y.o.   MRN: [id number]   Facility:  [facility]       No Known Allergies  Chief Complaint  Patient presents with  . Medical Management of Chronic Issues    Follow up    HPI:  He is a long term resident of this facility being seen for the management of his chronic illnesses. He is presently being treated for his right heel ulceration with cipro for two weeks. There are no reports of fever present. His drainage has significantly declined since starting the abt. He is unable to participate in the hpi or ros.    Past Medical History  Diagnosis Date  . Diabetes mellitus   . Hyperlipidemia   . Hypertension   . CAD (coronary artery disease) of bypass graft     Multivessel  . Dementia     MMSE 18/30 Feb 2012 [facility](Hospital)  . PVD (peripheral vascular disease) (HCC)   . Cataract     bilateral  . Hearing deficit   . CHF (congestive heart failure) [facility](HCC)     Past Surgical History  Procedure Laterality Date  . Coronary artery bypass graft      VITAL SIGNS BP 175/78 mmHg  Pulse 66  Temp(Src) 96.9 F (36.1 C) (Oral)  Resp 18  Ht 5\' 8"  (1.727 m)  Wt 185 lb (83.915 kg)  BMI 28.14 kg/m2  SpO2 98%  Patient's Medications  New Prescriptions   No medications on file  Previous Medications   ACETAMINOPHEN (TYLENOL) 500 MG TABLET    Take 1 tablet (500 mg total) by mouth every 6 (six) hours as needed.   ALBUTEROL (PROVENTIL) (2.5 MG/3ML) 0.083% NEBULIZER SOLUTION    Take 3 mLs (2.5 mg total) by nebulization every 6 (six) hours as needed for wheezing or shortness of breath.   ASPIRIN EC 81 MG TABLET    Take 81 mg by mouth daily.   ATORVASTATIN (LIPITOR) 20 MG TABLET    Take 20 mg by mouth at bedtime.   CHOLECALCIFEROL (VITAMIN D) 1000 UNITS TABLET    Take 2,000 Units by mouth daily.   CIPROFLOXACIN (CIPRO) 500 MG TABLET    Take 500 mg by mouth 2 (two) times daily.   DIVALPROEX (DEPAKOTE SPRINKLE) 125 MG CAPSULE    Take 250 mg by  mouth 3 (three) times daily.    DONEPEZIL (ARICEPT) 10 MG TABLET    Take 10 mg by mouth at bedtime.   ENALAPRIL (VASOTEC) 10 MG TABLET    Take 10 mg by mouth daily.   FERROUS SULFATE 325 (65 FE) MG TABLET    Take 325 mg by mouth daily with breakfast.   GUAIFENESIN (SILTUSSIN DAS) 100 MG/5ML LIQUID    Take 200 mg by mouth every 6 (six) hours as needed for cough. Reported on 01/11/2016   HYDRALAZINE (APRESOLINE) 10 MG TABLET    Take 10 mg by mouth 3 (three) times daily.   INSULIN ASPART (NOVOLOG) 100 UNIT/ML INJECTION    Inject 10 Units into the skin 3 (three) times daily before meals. For cbg >=150   LINAGLIPTIN (TRADJENTA) 5 MG TABS TABLET    Take 5 mg by mouth daily.   MEMANTINE (NAMENDA XR) 28 MG CP24 24 HR CAPSULE    Take 28 mg by mouth daily.   SACCHAROMYCES BOULARDII (FLORASTOR) 250 MG CAPSULE    Take 250 mg by mouth 2 (two) times daily.  Modified Medications   No medications on  file  Discontinued Medications     SIGNIFICANT DIAGNOSTIC EXAMS  11-02-15: chest x-ray: Cardiac enlargement.  No evidence of active pulmonary disease.  11-03-15: chest x-ray; Shallow inspiration with atelectasis in the lung bases. Cardiac enlargement. No evidence of active pulmonary disease.   11-03-15: ct of abdomen and pelvis: 1. No definite acute abnormality seen within the abdomen or pelvis. 2. Trace fluid at the right lower quadrant is nonspecific. 3. Diffuse calcification along the abdominal aorta and its branches, including along the superior mesenteric artery, inferior mesenteric artery and at the proximal renal arteries bilaterally. 4. Mild bibasilar atelectasis noted. 5. Diffuse coronary artery calcifications seen. 6. Mild bilateral renal atrophy noted.  11-04-15: 2-d echo: Left ventricle: The cavity size was normal. There was mild concentric hypertrophy. Systolic function was normal. The estimated ejection fraction was in the range of 50% to 55%. Wall motion was normal; there were no regional wall  motion abnormalities. Doppler parameters are consistent with abnormal left ventricular relaxation (grade 1 diastolic dysfunction). - Aortic valve: Valve mobility was restricted. Transvalvular velocity was increased, due to stenosis. There was mild stenosis. - Mitral valve: Calcified annulus. There was mild regurgitation. - Left atrium: The atrium was mildly dilated. - Right atrium: The atrium was mildly dilated.  02-04-16: right lower extremity doppler: negative for dvt     LABS REVIEWED:   11-02-15: wbc 4.7; hgb 13.2; hct 40.8; mcv 98.8; plt 167; glucose 60; bun 49; creat 1.90; k+ 4.2; na++146; liver normal albumin 4.0; lipase 20; tsh 0.614: urine culture: no growth: c-diff: neg  GI pathogen panel: negative 11-04-15: wbc 6.8; hgb 11.3; hct 34.1; mcv 98.3; plt 138; glucose 114; bun 32; creat 1.57; k+ 3.8; na++146; liver normal albumin 2.8 11-05-15: wbc 7.6; hgb 11.0; hct 33.4; mcv 96.5; plt 141; glucose 145; bun 21; creat 1.18; k+ 4.0; na++147; mag 1.5  11-06-15: wbc 8.5; hgb 11.0; hct 33.7; mcv 96.6; plt 149; glucose 161; bun 17; creat 1.05; k+ 3.6; na++151  11-17-15: wbc 6.3; hgb 12.4; hct 39.4; mcv 95.7; plt 185  01-12-16: hgb a1c 9.6; tsh 0.99; chol 145; ldl 74; trig 196; hdl 32    Review of Systems  Unable to perform ROS: dementia    Physical Exam  Constitutional: No distress.  Overweight   Eyes: Conjunctivae are normal.  Neck: Neck supple. No JVD present. No thyromegaly present.  Cardiovascular: Normal rate, regular rhythm and intact distal pulses.   Respiratory: Effort normal and breath sounds normal. No respiratory distress. He has no wheezes.  GI: Soft. Bowel sounds are normal. He exhibits no distension. There is no tenderness.  Musculoskeletal: He exhibits no edema.  Able to move all extremities   Lymphadenopathy:    He has no cervical adenopathy.  Neurological: He is alert.  Skin: Skin is warm and dry. He is not diaphoretic.  Right heel necrotic area present; has scant  drainage present.  Psychiatric: He has a normal mood and affect.       ASSESSMENT/ PLAN:  1. Dyslipidemia: will continue lipitor 20 mg daily ldl is 74   2. Anemia of chronic disease: hgb is 12.4; will continue iron daily   3. Hypertension: will continue hydralazine 10 mg three times daily  Will increase vasotec to 20 mg daily will check blood pressure twice daily will monitor Has calcification in bilateral renal arteries   4. CAD: status post cabg: no complaint of chest pain present; will continue asa 81 mg daily   5. Vascular dementia: no change in  status; his current weight is 209 pounds; will continue aricept 10 mg nightly will continue namenda xr 28 mg daily  will continue depakote sprinkles 250 mg three times daily to stabilize mood   6. Diabetes: will continue novolog 10 units with meals for cbg >=150 tradjenta 5 mg daily  hgb a1c 9.6   7. Bronchospasm: is stable will continue albuterol neb every 6 hours as needed and robitussin 200 mg every 6 hours as needed  8. Hypernatremia: his sodium tablets have been stopped; will monitor  9. CHF: he is presently stable will continue hydralazine 10 mg three times daily is presently not on diuretic will monitor    Will check bmp in one week        Synthia Innocent NP [facility]  Contact [phone] Monday through Friday 8am- 5pm  After hours call [phone]

## 2016-02-20 ENCOUNTER — Encounter: Payer: Self-pay | Admitting: Internal Medicine

## 2016-02-20 DIAGNOSIS — L89622 Pressure ulcer of left heel, stage 2: Secondary | ICD-10-CM | POA: Insufficient documentation

## 2016-02-20 NOTE — Assessment & Plan Note (Signed)
Start clina 300 mg q 6 for 10 days; heel floats at night

## 2016-03-01 DIAGNOSIS — IMO0002 Reserved for concepts with insufficient information to code with codable children: Secondary | ICD-10-CM | POA: Insufficient documentation

## 2016-03-01 DIAGNOSIS — E1165 Type 2 diabetes mellitus with hyperglycemia: Secondary | ICD-10-CM

## 2016-03-01 DIAGNOSIS — E1121 Type 2 diabetes mellitus with diabetic nephropathy: Secondary | ICD-10-CM | POA: Insufficient documentation

## 2016-03-05 ENCOUNTER — Encounter: Payer: Self-pay | Admitting: Internal Medicine

## 2016-03-05 ENCOUNTER — Non-Acute Institutional Stay (SKILLED_NURSING_FACILITY): Payer: Medicare Other | Admitting: Internal Medicine

## 2016-03-05 DIAGNOSIS — I25709 Atherosclerosis of coronary artery bypass graft(s), unspecified, with unspecified angina pectoris: Secondary | ICD-10-CM

## 2016-03-05 DIAGNOSIS — I5022 Chronic systolic (congestive) heart failure: Secondary | ICD-10-CM | POA: Diagnosis not present

## 2016-03-05 DIAGNOSIS — E785 Hyperlipidemia, unspecified: Secondary | ICD-10-CM | POA: Diagnosis not present

## 2016-03-05 NOTE — Progress Notes (Signed)
MRN: [id number] Name: Travis Coleman  Sex: male Age: 82 y.o. DOB: January 06, 1932  PSC #: Ronni Rumble Facility/Room:226 Level Of Care: SNF Provider: Merrilee Seashore MD Emergency Contacts: Extended Emergency Contact Information Primary Emergency Contact: Wendelyn Breslow [place] Home Phone: [phone] Mobile Phone: [phone] Relation: Sister  Code Status:   Allergies: Review of patient's allergies indicates no known allergies.  Chief Complaint  Patient presents with  . Medical Management of Chronic Issues    HPI: Patient is 82 y.o. male who is being seen for routine issues of CHF, CAD and HLD.  Past Medical History  Diagnosis Date  . Diabetes mellitus   . Hyperlipidemia   . Hypertension   . CAD (coronary artery disease) of bypass graft     Multivessel  . Dementia     MMSE 18/30 Feb 2012 [facility])  . PVD (peripheral vascular disease) (HCC)   . Cataract     bilateral  . Hearing deficit   . CHF (congestive heart failure) [facility])     Past Surgical History  Procedure Laterality Date  . Coronary artery bypass graft        Medication List       This list is accurate as of: 03/05/16 11:59 PM.  Always use your most recent med list.               acetaminophen 500 MG tablet  Commonly known as:  TYLENOL  Take 500 mg by mouth every 6 (six) hours as needed.     albuterol (2.5 MG/3ML) 0.083% nebulizer solution  Commonly known as:  PROVENTIL  Take 2.5 mg by nebulization every 6 (six) hours as needed for wheezing or shortness of breath.     aspirin EC 81 MG tablet  Take 81 mg by mouth daily.     atorvastatin 20 MG tablet  Commonly known as:  LIPITOR  Take 20 mg by mouth at bedtime.     cholecalciferol 1000 units tablet  Commonly known as:  VITAMIN D  Take 2,000 Units by mouth daily.     ciprofloxacin 500 MG tablet  Commonly known as:  CIPRO  Take 500 mg by mouth 2 (two) times daily.     divalproex 125 MG capsule  Commonly known as:   DEPAKOTE SPRINKLE  Take 250 mg by mouth 3 (three) times daily.     donepezil 10 MG tablet  Commonly known as:  ARICEPT  Take 10 mg by mouth at bedtime.     enalapril 10 MG tablet  Commonly known as:  VASOTEC  Take 10 mg by mouth daily.     ferrous sulfate 325 (65 FE) MG tablet  Take 325 mg by mouth daily with breakfast.     hydrALAZINE 10 MG tablet  Commonly known as:  APRESOLINE  Take 10 mg by mouth 3 (three) times daily.     insulin aspart 100 UNIT/ML injection  Commonly known as:  novoLOG  Inject 10 Units into the skin 3 (three) times daily before meals. For cbg >=150     linagliptin 5 MG Tabs tablet  Commonly known as:  TRADJENTA  Take 5 mg by mouth daily.     NAMENDA XR 28 MG Cp24 24 hr capsule  Generic drug:  memantine  Take 28 mg by mouth daily.     saccharomyces boulardii 250 MG capsule  Commonly known as:  FLORASTOR  Take 250 mg by mouth 2 (two) times daily.     SILTUSSIN DAS 100 MG/5ML liquid  Generic drug:  guaiFENesin  Take 200 mg by mouth every 6 (six) hours as needed for cough.        Meds ordered this encounter  Medications  . acetaminophen (TYLENOL) 500 MG tablet    Sig: Take 500 mg by mouth every 6 (six) hours as needed.  Marland Kitchen albuterol (PROVENTIL) (2.5 MG/3ML) 0.083% nebulizer solution    Sig: Take 2.5 mg by nebulization every 6 (six) hours as needed for wheezing or shortness of breath.  . guaiFENesin (SILTUSSIN DAS) 100 MG/5ML liquid    Sig: Take 200 mg by mouth every 6 (six) hours as needed for cough.    Immunization History  Administered Date(s) Administered  . Influenza,inj,Quad PF,36+ Mos 12/06/2013  . Influenza-Unspecified 12/28/2015  . Pneumococcal Polysaccharide-23 12/06/2013    Social History  Substance Use Topics  . Smoking status: Former Smoker    Quit date: 11/19/1948  . Smokeless tobacco: Never Used  . Alcohol Use: No    Review of Systems  UTO 2/2 dementia    Filed Vitals:   03/05/16 1638  BP: 112/68  Pulse: 76   Temp: 97.7 F (36.5 C)  Resp: 20    Physical Exam  GENERAL APPEARANCE: Alert,  No acute distress  SKIN: No diaphoresis rash, necrotic area R heel, not infected HEENT: Unremarkable RESPIRATORY: Breathing is even, unlabored. Lung sounds are clear   CARDIOVASCULAR: Heart RRR no murmurs, rubs or gallops. No peripheral edema  GASTROINTESTINAL: Abdomen is soft, non-tender, not distended w/ normal bowel sounds.  GENITOURINARY: Bladder non tender, not distended  MUSCULOSKELETAL: No abnormal joints or musculature NEUROLOGIC: Cranial nerves 2-12 grossly intact. Moves all extremities PSYCHIATRIC: dememntia, no behavioral issues  Patient Active Problem List   Diagnosis Date Noted  . 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 Decubitus ulcer of left heel, stage 2 02/20/2016  . Hypomagnesemia 11/10/2015  . Acute renal failure superimposed on stage 3 chronic kidney disease (HCC) 11/10/2015  . Anemia in chronic kidney disease 11/10/2015  . Hyperlipidemia 11/10/2015  . Hypertensive heart disease with CHF (congestive heart failure) (HCC) 11/08/2015  . Chronic systolic CHF (congestive heart failure) (HCC) 11/08/2015  . Vascular dementia without behavioral disturbance 11/08/2015  . Dyslipidemia associated with type 2 diabetes mellitus (HCC) 11/08/2015  . AKI (acute kidney injury) (HCC) 11/03/2015  . Hypoglycemia 11/03/2015  . Hypernatremia 11/03/2015  . Bronchospasm   . Bradycardia   . PVD (peripheral vascular disease) (HCC) 01/03/2011  . CAD (coronary artery disease) s/p CABG 01/03/2011    CBC    Component Value Date/Time   WBC 6.3 11/17/2015   WBC 8.5 11/06/2015 0518   RBC 3.49* 11/06/2015 0518   HGB 12.4* 11/17/2015   HCT 39* 11/17/2015   PLT 185 11/17/2015   MCV 96.6 11/06/2015 0518   LYMPHSABS 2.2 11/04/2015 0340   MONOABS 0.6 11/04/2015 0340   EOSABS 0.3 11/04/2015 0340   BASOSABS 0.0 11/04/2015 0340    CMP     Component Value Date/Time    NA 143 11/17/2015   NA 151* 11/06/2015 0518   K 3.9 11/17/2015   CL 125* 11/06/2015 0518   CO2 19* 11/06/2015 0518   GLUCOSE 161* 11/06/2015 0518   BUN 18 11/17/2015   BUN 17 11/06/2015 0518   CREATININE 0.9 11/17/2015   CREATININE 1.05 11/06/2015 0518   CALCIUM 8.2* 11/06/2015 0518   PROT 5.7* 11/04/2015 0340   ALBUMIN 2.8* 11/04/2015 0340   AST 32 11/04/2015 0340   ALT 27 11/04/2015 0340  ALKPHOS 50 11/04/2015 0340   BILITOT 0.8 11/04/2015 0340   GFRNONAA >60 11/06/2015 0518   GFRAA >60 11/06/2015 0518    Assessment and Plan  Chronic systolic CHF (congestive heart failure) (HCC) is presently stable will continue hydralazine 10 mg three times daily is presently not on diuretic will monitor    CAD (coronary artery disease) s/p CABG status post cabg: no complaint of chest pain present; will continue asa 81 mg daily   Hyperlipidemia lipitor 20 mg daily ldl is 74    Merrilee SeashoreAlexander, Eulogio Requena  MD

## 2016-03-13 ENCOUNTER — Encounter: Payer: Self-pay | Admitting: Internal Medicine

## 2016-03-13 NOTE — Assessment & Plan Note (Signed)
lipitor 20 mg daily ldl is 74

## 2016-03-13 NOTE — Assessment & Plan Note (Signed)
is presently stable will continue hydralazine 10 mg three times daily is presently not on diuretic will monitor

## 2016-03-13 NOTE — Assessment & Plan Note (Signed)
status post cabg: no complaint of chest pain present; will continue asa 81 mg daily

## 2016-03-29 ENCOUNTER — Encounter: Payer: Self-pay | Admitting: Internal Medicine

## 2016-03-29 ENCOUNTER — Non-Acute Institutional Stay (SKILLED_NURSING_FACILITY): Payer: Medicare Other | Admitting: Internal Medicine

## 2016-03-29 DIAGNOSIS — E1169 Type 2 diabetes mellitus with other specified complication: Secondary | ICD-10-CM | POA: Diagnosis not present

## 2016-03-29 DIAGNOSIS — F0150 Vascular dementia without behavioral disturbance: Secondary | ICD-10-CM

## 2016-03-29 DIAGNOSIS — D509 Iron deficiency anemia, unspecified: Secondary | ICD-10-CM | POA: Diagnosis not present

## 2016-03-29 DIAGNOSIS — E785 Hyperlipidemia, unspecified: Secondary | ICD-10-CM

## 2016-03-29 NOTE — Assessment & Plan Note (Signed)
no change in status; his current weight is 209 pounds; will continue aricept 10 mg nightly will continue namenda xr 28 mg daily will continue depakote sprinkles 250 mg three times daily to stabilize mood

## 2016-03-29 NOTE — Assessment & Plan Note (Signed)
Improved; Hb 12.4; cont iron daily

## 2016-03-29 NOTE — Assessment & Plan Note (Signed)
Controlled ; LDL 74; cont lipitor 20 mg daily

## 2016-03-29 NOTE — Progress Notes (Signed)
MRN: [id number] Name: Travis Coleman  Sex: male Age: 82 y.o. DOB: 27-Mar-1932  PSC #:  [facility]umble Facility/Room:226-A Level Of Care: SNF Provider:Joyel Chenette, Thurston Hole MD  Emergency Contacts: Extended Emergency Contact Information Primary Emergency Contact: Wendelyn Breslow [place] Home Phone: [phone] Mobile Phone: [phone] Relation: Sister  Code Status: FullCode  Allergies: Review of patient's allergies indicates no known allergies.  Chief Complaint  Patient presents with  . Medical Management of Chronic Issues    HPI: Patient is 82 y.o. male who is being seen for routine issues of anemai, HLD and vascular dementia.  Past Medical History  Diagnosis Date  . Diabetes mellitus   . Hyperlipidemia   . Hypertension   . CAD (coronary artery disease) of bypass graft     Multivessel  . Dementia     MMSE 18/30 Feb 2012 [facility])  . PVD (peripheral vascular disease) (HCC)   . Cataract     bilateral  . Hearing deficit   . CHF (congestive heart failure) [facility])     Past Surgical History  Procedure Laterality Date  . Coronary artery bypass graft        Medication List       This list is accurate as of: 03/29/16  8:36 PM.  Always use your most recent med list.               acetaminophen 500 MG tablet  Commonly known as:  TYLENOL  Take 500 mg by mouth every 6 (six) hours as needed.     albuterol (2.5 MG/3ML) 0.083% nebulizer solution  Commonly known as:  PROVENTIL  Take 2.5 mg by nebulization every 6 (six) hours as needed for wheezing or shortness of breath.     aspirin EC 81 MG tablet  Take 81 mg by mouth daily.     atorvastatin 20 MG tablet  Commonly known as:  LIPITOR  Take 20 mg by mouth at bedtime.     cholecalciferol 1000 units tablet  Commonly known as:  VITAMIN D  Take 2,000 Units by mouth daily.     divalproex 125 MG capsule  Commonly known as:  DEPAKOTE SPRINKLE  Take 250 mg by mouth 3 (three) times daily.     enalapril 10  MG tablet  Commonly known as:  VASOTEC  Take 10 mg by mouth daily.     ferrous sulfate 325 (65 FE) MG tablet  Take 325 mg by mouth daily with breakfast.     hydrALAZINE 10 MG tablet  Commonly known as:  APRESOLINE  Take 10 mg by mouth 3 (three) times daily.     insulin aspart 100 UNIT/ML injection  Commonly known as:  novoLOG  Inject 10 Units into the skin 3 (three) times daily before meals. For cbg >=150     linagliptin 5 MG Tabs tablet  Commonly known as:  TRADJENTA  Take 5 mg by mouth daily.     NAMZARIC 28-10 MG Cp24  Generic drug:  Memantine HCl-Donepezil HCl  Take 1 capsule by mouth at bedtime.        Meds ordered this encounter  Medications  . Memantine HCl-Donepezil HCl (NAMZARIC) 28-10 MG CP24    Sig: Take 1 capsule by mouth at bedtime.    Immunization History  Administered Date(s) Administered  . Influenza,inj,Quad PF,36+ Mos 12/06/2013  . Influenza-Unspecified 12/28/2015  . Pneumococcal Polysaccharide-23 12/06/2013    Social History  Substance Use Topics  . Smoking status: Former Smoker    Quit date: 11/19/1948  .  Smokeless tobacco: Never Used  . Alcohol Use: No    Review of Systems   UT fully participate    Filed Vitals:   03/29/16 1602  BP: 138/49  Pulse: 66  Temp: 96.9 F (36.1 C)  Resp: 18    Physical Exam  GENERAL APPEARANCE: Alert, conversant, No acute distress  SKIN: No diaphoresis rash;wound R heel dressed HEENT: Unremarkable RESPIRATORY: Breathing is even, unlabored. Lung sounds are clear   CARDIOVASCULAR: Heart RRR no murmurs, rubs or gallops. No peripheral edema  GASTROINTESTINAL: Abdomen is soft, non-tender, not distended w/ normal bowel sounds.  GENITOURINARY: Bladder non tender, not distended  MUSCULOSKELETAL: No abnormal joints or musculature NEUROLOGIC: Cranial nerves 2-12 grossly intact. Moves all extremities PSYCHIATRIC: dementia, no behavioral issues  Patient Active Problem List   Diagnosis Date Noted  . Iron  deficiency anemia 03/29/2016  . 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 Decubitus ulcer of left heel, stage 2 02/20/2016  . Hypomagnesemia 11/10/2015  . Acute renal failure superimposed on stage 3 chronic kidney disease (HCC) 11/10/2015  . Anemia in chronic kidney disease 11/10/2015  . Hyperlipidemia 11/10/2015  . Hypertensive heart disease with CHF (congestive heart failure) (HCC) 11/08/2015  . Chronic systolic CHF (congestive heart failure) (HCC) 11/08/2015  . Vascular dementia without behavioral disturbance 11/08/2015  . Dyslipidemia associated with type 2 diabetes mellitus (HCC) 11/08/2015  . AKI (acute kidney injury) (HCC) 11/03/2015  . Hypoglycemia 11/03/2015  . Hypernatremia 11/03/2015  . Bronchospasm   . Bradycardia   . PVD (peripheral vascular disease) (HCC) 01/03/2011  . CAD (coronary artery disease) s/p CABG 01/03/2011    CBC    Component Value Date/Time   WBC 6.3 11/17/2015   WBC 8.5 11/06/2015 0518   RBC 3.49* 11/06/2015 0518   HGB 12.4* 11/17/2015   HCT 39* 11/17/2015   PLT 185 11/17/2015   MCV 96.6 11/06/2015 0518   LYMPHSABS 2.2 11/04/2015 0340   MONOABS 0.6 11/04/2015 0340   EOSABS 0.3 11/04/2015 0340   BASOSABS 0.0 11/04/2015 0340    CMP     Component Value Date/Time   NA 143 11/17/2015   NA 151* 11/06/2015 0518   K 3.9 11/17/2015   CL 125* 11/06/2015 0518   CO2 19* 11/06/2015 0518   GLUCOSE 161* 11/06/2015 0518   BUN 18 11/17/2015   BUN 17 11/06/2015 0518   CREATININE 0.9 11/17/2015   CREATININE 1.05 11/06/2015 0518   CALCIUM 8.2* 11/06/2015 0518   PROT 5.7* 11/04/2015 0340   ALBUMIN 2.8* 11/04/2015 0340   AST 32 11/04/2015 0340   ALT 27 11/04/2015 0340   ALKPHOS 50 11/04/2015 0340   BILITOT 0.8 11/04/2015 0340   GFRNONAA >60 11/06/2015 0518   GFRAA >60 11/06/2015 0518    Assessment and Plan  Iron deficiency anemia Improved; Hb 12.4; cont iron daily  Vascular dementia without behavioral  disturbance no change in status; his current weight is 209 pounds; will continue aricept 10 mg nightly will continue namenda xr 28 mg daily will continue depakote sprinkles 250 mg three times daily to stabilize mood   Dyslipidemia associated with type 2 diabetes mellitus (HCC) Controlled ; LDL 74; cont lipitor 20 mg daily    Merrilee SeashoreAlexander, Kayson Tasker  MD

## 2016-05-08 ENCOUNTER — Non-Acute Institutional Stay (SKILLED_NURSING_FACILITY): Payer: Medicare Other | Admitting: Adult Health

## 2016-05-08 ENCOUNTER — Encounter: Payer: Self-pay | Admitting: Adult Health

## 2016-05-08 DIAGNOSIS — I5022 Chronic systolic (congestive) heart failure: Secondary | ICD-10-CM

## 2016-05-08 DIAGNOSIS — I25709 Atherosclerosis of coronary artery bypass graft(s), unspecified, with unspecified angina pectoris: Secondary | ICD-10-CM | POA: Diagnosis not present

## 2016-05-08 DIAGNOSIS — F0150 Vascular dementia without behavioral disturbance: Secondary | ICD-10-CM

## 2016-05-08 DIAGNOSIS — D631 Anemia in chronic kidney disease: Secondary | ICD-10-CM | POA: Diagnosis not present

## 2016-05-08 DIAGNOSIS — I110 Hypertensive heart disease with heart failure: Secondary | ICD-10-CM | POA: Diagnosis not present

## 2016-05-08 DIAGNOSIS — N189 Chronic kidney disease, unspecified: Secondary | ICD-10-CM | POA: Diagnosis not present

## 2016-05-08 DIAGNOSIS — E785 Hyperlipidemia, unspecified: Secondary | ICD-10-CM

## 2016-05-08 DIAGNOSIS — IMO0002 Reserved for concepts with insufficient information to code with codable children: Secondary | ICD-10-CM

## 2016-05-08 DIAGNOSIS — E1169 Type 2 diabetes mellitus with other specified complication: Secondary | ICD-10-CM | POA: Diagnosis not present

## 2016-05-08 DIAGNOSIS — E1165 Type 2 diabetes mellitus with hyperglycemia: Secondary | ICD-10-CM

## 2016-05-08 DIAGNOSIS — E1121 Type 2 diabetes mellitus with diabetic nephropathy: Secondary | ICD-10-CM

## 2016-05-08 NOTE — Progress Notes (Signed)
Location:  [facility] Room Number: 226 A Place of Service:  SNF (31)   CODE STATUS: full code   No Known Allergies  Chief Complaint  Patient presents with  . Medical Management of Chronic Issues    Routine Visit    HPI:  He is a long term resident of this facility being seen for the management of his chronic illnesses. Overall his status is stable. He is out of bed daily and does go through out the floor. He is unable to fully participate in the hpi or ros. There are no nursing concerns at this time.    Past Medical History  Diagnosis Date  . Diabetes mellitus   . Hyperlipidemia   . Hypertension   . CAD (coronary artery disease) of bypass graft     Multivessel  . Dementia     MMSE 18/30 Feb 2012 [facility])  . PVD (peripheral vascular disease) (HCC)   . Cataract     bilateral  . Hearing deficit   . CHF (congestive heart failure) [facility])     Past Surgical History  Procedure Laterality Date  . Coronary artery bypass graft      Social History   Social History  . Marital Status: Single    Spouse Name: N/A  . Number of Children: N/A  . Years of Education: N/A   Occupational History  . Not on file.   Social History Main Topics  . Smoking status: Former Smoker    Quit date: 11/19/1948  . Smokeless tobacco: Never Used  . Alcohol Use: No  . Drug Use: No  . Sexual Activity: Not Currently   Other Topics Concern  . Not on file   Social History Narrative   Family History  Problem Relation Age of Onset  . Diabetes Mother   . Dementia Mother     alzheimer  . Dementia Father   . Stroke Father   . Heart disease Father   . Dementia Sister       VITAL SIGNS BP 146/72 mmHg  Pulse 66  Temp(Src) 96.9 F (36.1 C) (Oral)  Resp 18  Ht  (1.727 m)  Wt 180 lb (81.647 kg)  BMI 27.38 kg/m2  Patient's Medications  New Prescriptions   No medications on file  Previous Medications   ACETAMINOPHEN (TYLENOL) 500 MG  TABLET    Take 500 mg by mouth every 6 (six) hours as needed.   ALBUTEROL (PROVENTIL) (2.5 MG/3ML) 0.083% NEBULIZER SOLUTION    Take 2.5 mg by nebulization every 6 (six) hours as needed for wheezing or shortness of breath.   ASPIRIN EC 81 MG TABLET    Take 81 mg by mouth daily.   ATORVASTATIN (LIPITOR) 20 MG TABLET    Take 20 mg by mouth at bedtime.   CHOLECALCIFEROL (VITAMIN D) 1000 UNITS TABLET    Take 2,000 Units by mouth daily.   DIVALPROEX (DEPAKOTE SPRINKLE) 125 MG CAPSULE    Take 250 mg by mouth 3 (three) times daily.    ENALAPRIL (VASOTEC) 10 MG TABLET    Take 10 mg by mouth daily.   FERROUS SULFATE 325 (65 FE) MG TABLET    Take 325 mg by mouth daily with breakfast.   GUAIFENESIN (ROBITUSSIN) 100 MG/5ML LIQUID    Take 200 mg by mouth every 6 (six) hours as needed for cough.   HYDRALAZINE (APRESOLINE) 10 MG TABLET    Take 10 mg by mouth 3 (three) times daily.  INSULIN ASPART (NOVOLOG) 100 UNIT/ML INJECTION    Inject 10 Units into the skin 3 (three) times daily before meals. For cbg >=150   INSULIN GLARGINE (LANTUS) 100 UNIT/ML INJECTION    Inject 5 Units into the skin at bedtime.   LINAGLIPTIN (TRADJENTA) 5 MG TABS TABLET    Take 5 mg by mouth daily.   MEMANTINE HCL-DONEPEZIL HCL (NAMZARIC) 28-10 MG CP24    Take 1 capsule by mouth at bedtime.  Modified Medications   No medications on file  Discontinued Medications   No medications on file     SIGNIFICANT DIAGNOSTIC EXAMS   11-02-15: chest x-ray: Cardiac enlargement.  No evidence of active pulmonary disease.  11-03-15: chest x-ray; Shallow inspiration with atelectasis in the lung bases. Cardiac enlargement. No evidence of active pulmonary disease.   11-03-15: ct of abdomen and pelvis: 1. No definite acute abnormality seen within the abdomen or pelvis. 2. Trace fluid at the right lower quadrant is nonspecific. 3. Diffuse calcification along the abdominal aorta and its branches, including along the superior mesenteric artery,  inferior mesenteric artery and at the proximal renal arteries bilaterally. 4. Mild bibasilar atelectasis noted. 5. Diffuse coronary artery calcifications seen. 6. Mild bilateral renal atrophy noted.  11-04-15: 2-d echo: Left ventricle: The cavity size was normal. There was mild concentric hypertrophy. Systolic function was normal. The estimated ejection fraction was in the range of 50% to 55%. Wall motion was normal; there were no regional wall motion abnormalities. Doppler parameters are consistent with abnormal left ventricular relaxation (grade 1 diastolic dysfunction). - Aortic valve: Valve mobility was restricted. Transvalvular velocity was increased, due to stenosis. There was mild stenosis. - Mitral valve: Calcified annulus. There was mild regurgitation. - Left atrium: The atrium was mildly dilated. - Right atrium: The atrium was mildly dilated.  02-04-16: right lower extremity doppler: negative for dvt     LABS REVIEWED:   11-02-15: wbc 4.7; hgb 13.2; hct 40.8; mcv 98.8; plt 167; glucose 60; bun 49; creat 1.90; k+ 4.2; na++146; liver normal albumin 4.0; lipase 20; tsh 0.614: urine culture: no growth: c-diff: neg  GI pathogen panel: negative 11-04-15: wbc 6.8; hgb 11.3; hct 34.1; mcv 98.3; plt 138; glucose 114; bun 32; creat 1.57; k+ 3.8; na++146; liver normal albumin 2.8 11-05-15: wbc 7.6; hgb 11.0; hct 33.4; mcv 96.5; plt 141; glucose 145; bun 21; creat 1.18; k+ 4.0; na++147; mag 1.5  11-06-15: wbc 8.5; hgb 11.0; hct 33.7; mcv 96.6; plt 149; glucose 161; bun 17; creat 1.05; k+ 3.6; na++151  11-17-15: wbc 6.3; hgb 12.4; hct 39.4; mcv 95.7; plt 185  01-12-16: hgb a1c 9.6; tsh 0.99; chol 145; ldl 74; trig 196; hdl 32    Review of Systems  Unable to perform ROS: dementia    Physical Exam  Constitutional: No distress.  Overweight   Eyes: Conjunctivae are normal.  Neck: Neck supple. No JVD present. No thyromegaly present.  Cardiovascular: Normal rate, regular rhythm and intact  distal pulses.   Respiratory: Effort normal and breath sounds normal. No respiratory distress. He has no wheezes.  GI: Soft. Bowel sounds are normal. He exhibits no distension. There is no tenderness.  Musculoskeletal: He exhibits no edema.  Able to move all extremities   Lymphadenopathy:    He has no cervical adenopathy.  Neurological: He is alert.  Skin: Skin is warm and dry. He is not diaphoretic.   Psychiatric: He has a normal mood and affect.       ASSESSMENT/ PLAN:  1.  Dyslipidemia: will continue lipitor 20 mg daily ldl is 74   2. Anemia of chronic disease: hgb is 12.4; will continue iron daily   3. Hypertension: will continue hydralazine 10 mg three times daily  Will increase vasotec to 20 mg daily will check blood pressure twice daily will monitor Has calcification in bilateral renal arteries   4. CAD: status post cabg: no complaint of chest pain present; will continue asa 81 mg daily   5. Vascular dementia: no change in status; his current weight is 209 pounds; will continue aricept 10 mg nightly will continue namenda xr 28 mg daily  will continue depakote sprinkles 250 mg three times daily to stabilize mood   6. Diabetes: will continue novolog 10 units with meals for cbg >=150 tradjenta 5 mg daily  hgb a1c 9.6   7. Bronchospasm: is stable will continue albuterol neb every 6 hours as needed and robitussin 200 mg every 6 hours as needed  8. Hypernatremia: his sodium tablets have been stopped; will monitor  9. CHF: he is presently stable will continue hydralazine 10 mg three times daily is presently not on diuretic will monitor     Will check cbc cmp hgb a1c lipids urine for micro-albumin      Synthia Innocent NP [facility]  Contact [phone] Monday through Friday 8am- 5pm  After hours call [phone]

## 2016-06-13 ENCOUNTER — Non-Acute Institutional Stay (SKILLED_NURSING_FACILITY): Payer: Medicare Other | Admitting: Adult Health

## 2016-06-13 ENCOUNTER — Encounter: Payer: Self-pay | Admitting: Adult Health

## 2016-06-13 DIAGNOSIS — E1165 Type 2 diabetes mellitus with hyperglycemia: Secondary | ICD-10-CM

## 2016-06-13 DIAGNOSIS — E1169 Type 2 diabetes mellitus with other specified complication: Secondary | ICD-10-CM | POA: Diagnosis not present

## 2016-06-13 DIAGNOSIS — F0150 Vascular dementia without behavioral disturbance: Secondary | ICD-10-CM

## 2016-06-13 DIAGNOSIS — J9801 Acute bronchospasm: Secondary | ICD-10-CM | POA: Diagnosis not present

## 2016-06-13 DIAGNOSIS — I110 Hypertensive heart disease with heart failure: Secondary | ICD-10-CM | POA: Diagnosis not present

## 2016-06-13 DIAGNOSIS — I25709 Atherosclerosis of coronary artery bypass graft(s), unspecified, with unspecified angina pectoris: Secondary | ICD-10-CM | POA: Diagnosis not present

## 2016-06-13 DIAGNOSIS — I5022 Chronic systolic (congestive) heart failure: Secondary | ICD-10-CM

## 2016-06-13 DIAGNOSIS — D509 Iron deficiency anemia, unspecified: Secondary | ICD-10-CM

## 2016-06-13 DIAGNOSIS — E1121 Type 2 diabetes mellitus with diabetic nephropathy: Secondary | ICD-10-CM | POA: Diagnosis not present

## 2016-06-13 DIAGNOSIS — E785 Hyperlipidemia, unspecified: Secondary | ICD-10-CM

## 2016-06-13 DIAGNOSIS — IMO0002 Reserved for concepts with insufficient information to code with codable children: Secondary | ICD-10-CM

## 2016-06-13 NOTE — Progress Notes (Signed)
Patient ID: Travis Coleman, male   DOB: [birth year], 82 y.o.   MRN: [id number]   Location:   [facility] Room Number: 226-A Place of Service:  SNF (31)   CODE STATUS: Full Code  No Known Allergies  Chief Complaint  Patient presents with  . Medical Management of Chronic Issues    Follow up    HPI:  He is a long term resident of this facility being seen for the management of his chronic illnesses. Overall his status is stable. He is unable to fully participate in the hpi or ros. There are no nursing concerns at this time.    Past Medical History:  Diagnosis Date  . CAD (coronary artery disease) of bypass graft    Multivessel  . Cataract    bilateral  . CHF (congestive heart failure) (HCC)   . Dementia    MMSE 18/30 Feb 2012 [facility])  . Diabetes mellitus   . Hearing deficit   . Hyperlipidemia   . Hypertension   . PVD (peripheral vascular disease) (HCC)     Past Surgical History:  Procedure Laterality Date  . CORONARY ARTERY BYPASS GRAFT      Social History   Social History  . Marital status: Single    Spouse name: N/A  . Number of children: N/A  . Years of education: N/A   Occupational History  . Not on file.   Social History Main Topics  . Smoking status: Former Smoker    Quit date: 11/19/1948  . Smokeless tobacco: Never Used  . Alcohol use No  . Drug use: No  . Sexual activity: Not Currently   Other Topics Concern  . Not on file   Social History Narrative  . No narrative on file   Family History  Problem Relation Age of Onset  . Diabetes Mother   . Dementia Mother     alzheimer  . Dementia Father   . Stroke Father   . Heart disease Father   . Dementia Sister       VITAL SIGNS BP (!) 144/70   Pulse 66   Temp (!) 96.9 F (36.1 C) (Oral)   Resp 18   Ht 5\' 8"  (1.727 m)   Wt 188 lb (85.3 kg)   SpO2 98%   BMI 28.59 kg/m   Patient's Medications  New Prescriptions   No medications on file  Previous Medications   ACETAMINOPHEN (TYLENOL) 500 MG TABLET    Take 500 mg by mouth every 6 (six) hours as needed.   ALBUTEROL (PROVENTIL) (2.5 MG/3ML) 0.083% NEBULIZER SOLUTION    Take 2.5 mg by nebulization every 6 (six) hours as needed for wheezing or shortness of breath.   ASPIRIN EC 81 MG TABLET    Take 81 mg by mouth daily.   ATORVASTATIN (LIPITOR) 20 MG TABLET    Take 20 mg by mouth at bedtime.   CHOLECALCIFEROL (VITAMIN D) 1000 UNITS TABLET    Take 2,000 Units by mouth daily.   DIVALPROEX (DEPAKOTE SPRINKLE) 125 MG CAPSULE    Take 250 mg by mouth 3 (three) times daily.    ENALAPRIL (VASOTEC) 10 MG TABLET    Take 10 mg by mouth daily.   FERROUS SULFATE 325 (65 FE) MG TABLET    Take 325 mg by mouth daily with breakfast.   GUAIFENESIN (ROBITUSSIN) 100 MG/5ML LIQUID    Take 200 mg by mouth every 6 (six) hours as needed for cough.   HYDRALAZINE (APRESOLINE) 10 MG  TABLET    Take 10 mg by mouth 3 (three) times daily.   INSULIN ASPART (NOVOLOG) 100 UNIT/ML INJECTION    Inject 5 Units into the skin 3 (three) times daily with meals. Inject as per sliding scale: if  0-59=0 Call MD ; 60-150 = 10 units, 151-449= 15 units. Call MD for CBG < 60 or > 450, subcutaneously before meals related to DM.   INSULIN GLARGINE (LANTUS) 100 UNIT/ML INJECTION    Inject 5 Units into the skin at bedtime.   LINAGLIPTIN (TRADJENTA) 5 MG TABS TABLET    Take 5 mg by mouth daily.   MEMANTINE HCL-DONEPEZIL HCL (NAMZARIC) 28-10 MG CP24    Take 1 capsule by mouth at bedtime.   MULTIPLE VITAMINS-MINERALS (DECUBI-VITE) CAPS    Take 1 capsule by mouth daily.  Modified Medications   No medications on file  Discontinued Medications   INSULIN ASPART (NOVOLOG) 100 UNIT/ML INJECTION    Inject 10 Units into the skin 3 (three) times daily before meals. For cbg >=150     SIGNIFICANT DIAGNOSTIC EXAMS  11-03-15: chest x-ray; Shallow inspiration with atelectasis in the lung bases. Cardiac enlargement. No evidence of active pulmonary disease.   11-03-15:  ct of abdomen and pelvis: 1. No definite acute abnormality seen within the abdomen or pelvis. 2. Trace fluid at the right lower quadrant is nonspecific. 3. Diffuse calcification along the abdominal aorta and its branches, including along the superior mesenteric artery, inferior mesenteric artery and at the proximal renal arteries bilaterally. 4. Mild bibasilar atelectasis noted. 5. Diffuse coronary artery calcifications seen. 6. Mild bilateral renal atrophy noted.  11-04-15: 2-d echo: Left ventricle: The cavity size was normal. There was mild concentric hypertrophy. Systolic function was normal. The estimated ejection fraction was in the range of 50% to 55%. Wall motion was normal; there were no regional wall motion abnormalities. Doppler parameters are consistent with abnormal left ventricular relaxation (grade 1 diastolic dysfunction). - Aortic valve: Valve mobility was restricted. Transvalvular velocity was increased, due to stenosis. There was mild stenosis. - Mitral valve: Calcified annulus. There was mild regurgitation. - Left atrium: The atrium was mildly dilated. - Right atrium: The atrium was mildly dilated.  02-04-16: right lower extremity doppler: negative for dvt     LABS REVIEWED:   11-02-15: wbc 4.7; hgb 13.2; hct 40.8; mcv 98.8; plt 167; glucose 60; bun 49; creat 1.90; k+ 4.2; na++146; liver normal albumin 4.0; lipase 20; tsh 0.614: urine culture: no growth: c-diff: neg  GI pathogen panel: negative 11-04-15: wbc 6.8; hgb 11.3; hct 34.1; mcv 98.3; plt 138; glucose 114; bun 32; creat 1.57; k+ 3.8; na++146; liver normal albumin 2.8 11-05-15: wbc 7.6; hgb 11.0; hct 33.4; mcv 96.5; plt 141; glucose 145; bun 21; creat 1.18; k+ 4.0; na++147; mag 1.5  11-06-15: wbc 8.5; hgb 11.0; hct 33.7; mcv 96.6; plt 149; glucose 161; bun 17; creat 1.05; k+ 3.6; na++151  11-17-15: wbc 6.3; hgb 12.4; hct 39.4; mcv 95.7; plt 185  01-12-16: hgb a1c 9.6; tsh 0.99; chol 145; ldl 74; trig 196; hdl 32     Review of Systems  Unable to perform ROS: dementia    Physical Exam  Constitutional: No distress.  Overweight   Eyes: Conjunctivae are normal.  Neck: Neck supple. No JVD present. No thyromegaly present.  Cardiovascular: Normal rate, regular rhythm and intact distal pulses.   Respiratory: Effort normal and breath sounds normal. No respiratory distress. He has no wheezes.  GI: Soft. Bowel sounds are normal. He  exhibits no distension. There is no tenderness.  Musculoskeletal: He exhibits no edema.  Able to move all extremities   Lymphadenopathy:    He has no cervical adenopathy.  Neurological: He is alert.  Skin: Skin is warm and dry. He is not diaphoretic.   Psychiatric: He has a normal mood and affect.       ASSESSMENT/ PLAN:  1. Dyslipidemia: will continue lipitor 20 mg daily ldl is 74   2. Anemia of chronic disease: hgb is 12.4; will continue iron daily   3. Hypertension: will continue hydralazine 10 mg three times daily  Will increase vasotec to 20 mg daily will check blood pressure twice daily will monitor Has calcification in bilateral renal arteries   4. CAD: status post cabg: no complaint of chest pain present; will continue asa 81 mg daily   5. Vascular dementia: no change in status; his current weight is 209 pounds; will continue aricept 10 mg nightly will continue namenda xr 28 mg daily  will continue depakote sprinkles 250 mg three times daily to stabilize mood   6. Diabetes: will continue novolog 10 units with meals for cbg >=150 tradjenta 5 mg daily  hgb a1c 9.6   7. Bronchospasm: is stable will continue albuterol neb every 6 hours as needed and robitussin 200 mg every 6 hours as needed  8. Hypernatremia: his sodium tablets have been stopped; will resolve this problem   9. CHF: he is presently stable will continue hydralazine 10 mg three times daily is presently not on diuretic will monitor     Will check cbc cmp hgb a1c lipids urine for micro-albumin            Synthia Innocent NP [facility]  Contact [phone] Monday through Friday 8am- 5pm  After hours call [phone]

## 2016-06-14 LAB — MICROALBUMIN, URINE: Microalb, Ur: 12.5

## 2016-06-14 LAB — BASIC METABOLIC PANEL
BUN: 39 mg/dL — AB (ref 4–21)
Creatinine: 1.5 mg/dL — AB (ref 0.6–1.3)
GLUCOSE: 158 mg/dL
Potassium: 5.1 mmol/L (ref 3.4–5.3)
SODIUM: 141 mmol/L (ref 137–147)

## 2016-06-14 LAB — CBC AND DIFFERENTIAL
HEMATOCRIT: 35 % — AB (ref 41–53)
Hemoglobin: 11.9 g/dL — AB (ref 13.5–17.5)
Platelets: 112 10*3/uL — AB (ref 150–399)
WBC: 5.7 10*3/mL

## 2016-06-14 LAB — HEPATIC FUNCTION PANEL
ALT: 6 U/L — AB (ref 10–40)
AST: 9 U/L — AB (ref 14–40)
Alkaline Phosphatase: 57 U/L (ref 25–125)
BILIRUBIN, TOTAL: 0.3 mg/dL

## 2016-06-14 LAB — HEMOGLOBIN A1C: HEMOGLOBIN A1C: 6.8

## 2016-06-29 ENCOUNTER — Encounter: Payer: Self-pay | Admitting: Adult Health

## 2016-06-29 ENCOUNTER — Non-Acute Institutional Stay (SKILLED_NURSING_FACILITY): Payer: Medicare Other | Admitting: Adult Health

## 2016-06-29 DIAGNOSIS — L89614 Pressure ulcer of right heel, stage 4: Secondary | ICD-10-CM | POA: Diagnosis not present

## 2016-06-29 NOTE — Progress Notes (Signed)
Patient ID: Travis Coleman, male   DOB: May 03, 1932, 82 y.o.   MRN: [id number]    Location:   [facility] Room Number: 226-A Place of Service:  SNF (31)   CODE STATUS: Full Code  No Known Allergies  Chief Complaint  Patient presents with  . Acute Visit    Wound Management    HPI:  I have been asked to review his wound for secondary signs of infection. There are no reports of fever present. He is not complaining of pain present.    Past Medical History:  Diagnosis Date  . CAD (coronary artery disease) of bypass graft    Multivessel  . Cataract    bilateral  . CHF (congestive heart failure) (HCC)   . Dementia    MMSE 18/30 Feb 2012 [facility])  . Diabetes mellitus   . Hearing deficit   . Hyperlipidemia   . Hypertension   . PVD (peripheral vascular disease) (HCC)     Past Surgical History:  Procedure Laterality Date  . CORONARY ARTERY BYPASS GRAFT      Social History   Social History  . Marital status: Single    Spouse name: N/A  . Number of children: N/A  . Years of education: N/A   Occupational History  . Not on file.   Social History Main Topics  . Smoking status: Former Smoker    Quit date: 11/19/1948  . Smokeless tobacco: Never Used  . Alcohol use No  . Drug use: No  . Sexual activity: Not Currently   Other Topics Concern  . Not on file   Social History Narrative  . No narrative on file   Family History  Problem Relation Age of Onset  . Diabetes Mother   . Dementia Mother     alzheimer  . Dementia Father   . Stroke Father   . Heart disease Father   . Dementia Sister       VITAL SIGNS BP (!) 144/78   Pulse 66   Temp (!) 96.9 F (36.1 C) (Oral)   Resp 18   Ht  (1.727 m)   Wt 184 lb (83.5 kg)   SpO2 98%   BMI 27.98 kg/m   Patient's Medications  New Prescriptions   No medications on file  Previous Medications   ACETAMINOPHEN (TYLENOL) 500 MG TABLET    Take 500 mg by mouth every 6 (six) hours as needed.   ALBUTEROL (PROVENTIL) (2.5 MG/3ML) 0.083% NEBULIZER SOLUTION    Take 2.5 mg by nebulization every 6 (six) hours as needed for wheezing or shortness of breath.   ASPIRIN EC 81 MG TABLET    Take 81 mg by mouth daily.   ATORVASTATIN (LIPITOR) 20 MG TABLET    Take 20 mg by mouth at bedtime.   CHOLECALCIFEROL (VITAMIN D) 1000 UNITS TABLET    Take 2,000 Units by mouth daily.   DIVALPROEX (DEPAKOTE SPRINKLE) 125 MG CAPSULE    Take 250 mg by mouth 3 (three) times daily.    ENALAPRIL (VASOTEC) 20 MG TABLET    Take 20 mg by mouth daily.   FERROUS SULFATE 325 (65 FE) MG TABLET    Take 325 mg by mouth daily with breakfast.   GUAIFENESIN (ROBITUSSIN) 100 MG/5ML LIQUID    Take 200 mg by mouth every 6 (six) hours as needed for cough.   HYDRALAZINE (APRESOLINE) 10 MG TABLET    Take 10 mg by mouth 3 (three) times daily.   INSULIN ASPART (  NOVOLOG) 100 UNIT/ML INJECTION    Inject 10 Units into the skin 3 (three) times daily with meals. Inject as per sliding scale: if  0-59=0 Call MD ; 60-150 = 10 units, 151-449= 15 units. Call MD for CBG < 60 or > 450, subcutaneously before meals related to DM.    INSULIN GLARGINE (LANTUS) 100 UNIT/ML INJECTION    Inject 5 Units into the skin at bedtime.   LINAGLIPTIN (TRADJENTA) 5 MG TABS TABLET    Take 5 mg by mouth daily.   MEMANTINE HCL-DONEPEZIL HCL (NAMZARIC) 28-10 MG CP24    Take 1 capsule by mouth at bedtime.   MULTIPLE VITAMINS-MINERALS (DECUBI-VITE) CAPS    Take 1 capsule by mouth daily.  Modified Medications   No medications on file  Discontinued Medications   ENALAPRIL (VASOTEC) 10 MG TABLET    Take 10 mg by mouth daily.     SIGNIFICANT DIAGNOSTIC EXAMS  11-03-15: chest x-ray; Shallow inspiration with atelectasis in the lung bases. Cardiac enlargement. No evidence of active pulmonary disease.   11-03-15: ct of abdomen and pelvis: 1. No definite acute abnormality seen within the abdomen or pelvis. 2. Trace fluid at the right lower quadrant is nonspecific. 3.  Diffuse calcification along the abdominal aorta and its branches, including along the superior mesenteric artery, inferior mesenteric artery and at the proximal renal arteries bilaterally. 4. Mild bibasilar atelectasis noted. 5. Diffuse coronary artery calcifications seen. 6. Mild bilateral renal atrophy noted.  11-04-15: 2-d echo: Left ventricle: The cavity size was normal. There was mild concentric hypertrophy. Systolic function was normal. The estimated ejection fraction was in the range of 50% to 55%. Wall motion was normal; there were no regional wall motion abnormalities. Doppler parameters are consistent with abnormal left ventricular relaxation (grade 1 diastolic dysfunction). - Aortic valve: Valve mobility was restricted. Transvalvular velocity was increased, due to stenosis. There was mild stenosis. - Mitral valve: Calcified annulus. There was mild regurgitation. - Left atrium: The atrium was mildly dilated. - Right atrium: The atrium was mildly dilated.  02-04-16: right lower extremity doppler: negative for dvt     LABS REVIEWED:   11-02-15: wbc 4.7; hgb 13.2; hct 40.8; mcv 98.8; plt 167; glucose 60; bun 49; creat 1.90; k+ 4.2; na++146; liver normal albumin 4.0; lipase 20; tsh 0.614: urine culture: no growth: c-diff: neg  GI pathogen panel: negative 11-04-15: wbc 6.8; hgb 11.3; hct 34.1; mcv 98.3; plt 138; glucose 114; bun 32; creat 1.57; k+ 3.8; na++146; liver normal albumin 2.8 11-05-15: wbc 7.6; hgb 11.0; hct 33.4; mcv 96.5; plt 141; glucose 145; bun 21; creat 1.18; k+ 4.0; na++147; mag 1.5  11-06-15: wbc 8.5; hgb 11.0; hct 33.7; mcv 96.6; plt 149; glucose 161; bun 17; creat 1.05; k+ 3.6; na++151  11-17-15: wbc 6.3; hgb 12.4; hct 39.4; mcv 95.7; plt 185  01-12-16: hgb a1c 9.6; tsh 0.99; chol 145; ldl 74; trig 196; hdl 32  1-61-097-27-17: wbc 5.7; hgb 11.9; hct 34.5; mcv 92.8; plt 112; glucose 158; bun 39; creat 1.52; k+ 5.1; na++ 141; liver normal albumin 3.4; hgb a1c 6.8 urine  micro-albumin 12.5   Review of Systems  Unable to perform ROS: dementia    Physical Exam  Constitutional: No distress.  Overweight   Eyes: Conjunctivae are normal.  Neck: Neck supple. No JVD present. No thyromegaly present.  Cardiovascular: Normal rate, regular rhythm and intact distal pulses.   Respiratory: Effort normal and breath sounds normal. No respiratory distress. He has no wheezes.  GI: Soft.  Bowel sounds are normal. He exhibits no distension. There is no tenderness.  Musculoskeletal: He exhibits no edema.  Able to move all extremities   Lymphadenopathy:    He has no cervical adenopathy.  Neurological: He is alert.  Skin: Skin is warm and dry. He is not diaphoretic.   Right heel stage IV: 0.2 x 0.2 x 0.11 cm betadine Psychiatric: He has a normal mood and affect.       ASSESSMENT/ PLAN:  1. Right heel stage IV ulceration: is followed by the wound doctor; will continue current treatment and will monitor his status. The wound is without secondary signs of infection warranting culture for group strept A.    MD is aware of resident's narcotic use and is in agreement with current plan of care. We will attempt to wean resident as apropriate   Synthia Innocent NP [facility]  Contact [phone] Monday through Friday 8am- 5pm  After hours call [phone]

## 2016-07-10 ENCOUNTER — Encounter: Payer: Self-pay | Admitting: Internal Medicine

## 2016-07-10 ENCOUNTER — Non-Acute Institutional Stay (SKILLED_NURSING_FACILITY): Payer: Medicare Other | Admitting: Internal Medicine

## 2016-07-10 DIAGNOSIS — IMO0002 Reserved for concepts with insufficient information to code with codable children: Secondary | ICD-10-CM

## 2016-07-10 DIAGNOSIS — F0150 Vascular dementia without behavioral disturbance: Secondary | ICD-10-CM

## 2016-07-10 DIAGNOSIS — E11621 Type 2 diabetes mellitus with foot ulcer: Secondary | ICD-10-CM | POA: Diagnosis not present

## 2016-07-10 DIAGNOSIS — E1121 Type 2 diabetes mellitus with diabetic nephropathy: Secondary | ICD-10-CM | POA: Diagnosis not present

## 2016-07-10 DIAGNOSIS — L97519 Non-pressure chronic ulcer of other part of right foot with unspecified severity: Secondary | ICD-10-CM | POA: Diagnosis not present

## 2016-07-10 DIAGNOSIS — E1165 Type 2 diabetes mellitus with hyperglycemia: Secondary | ICD-10-CM

## 2016-07-10 DIAGNOSIS — L89614 Pressure ulcer of right heel, stage 4: Secondary | ICD-10-CM | POA: Diagnosis not present

## 2016-07-10 NOTE — Progress Notes (Signed)
Patient ID: Travis Coleman, male   DOB: 07-14-1932, 82 y.o.   MRN: [id number]    DATE: 07/10/16  Location:    [facility] Room Number: 226 A Place of Service: SNF (31)   Extended Emergency Contact Information Primary Emergency Contact: Wendelyn Breslow [place] Home Phone: [phone] Mobile Phone: [phone] Relation: Sister  Advanced Directive information    Chief Complaint  Patient presents with  . Acute Visit    Right big toe red and inflamed    HPI:  82 yo male seen today for right 1st toe redness/inflammation, swelling and d/c per nursing. He is diabetic. CBG 288 today. No low BS reactions. No f/c. He is a poor historian due to dementia. Hx obtained from chart  Right heel stage IV ulceration- followed by the wound doctor  Past Medical History:  Diagnosis Date  . CAD (coronary artery disease) of bypass graft    Multivessel  . Cataract    bilateral  . CHF (congestive heart failure) (HCC)   . Dementia    MMSE 18/30 Feb 2012 [facility])  . Diabetes mellitus   . Hearing deficit   . Hyperlipidemia   . Hypertension   . PVD (peripheral vascular disease) (HCC)     Past Surgical History:  Procedure Laterality Date  . CORONARY ARTERY BYPASS GRAFT      Patient Care Team: Florentina Jenny, MD as PCP - General (Family Medicine)  Social History   Social History  . Marital status: Single    Spouse name: N/A  . Number of children: N/A  . Years of education: N/A   Occupational History  . Not on file.   Social History Main Topics  . Smoking status: Former Smoker    Quit date: 11/19/1948  . Smokeless tobacco: Never Used  . Alcohol use No  . Drug use: No  . Sexual activity: Not Currently   Other Topics Concern  . Not on file   Social History Narrative  . No narrative on file     reports that he quit smoking about 67 years ago. He has never used smokeless tobacco. He reports that he does not drink alcohol or use drugs.  Family  History  Problem Relation Age of Onset  . Diabetes Mother   . Dementia Mother     alzheimer  . Dementia Father   . Stroke Father   . Heart disease Father   . Dementia Sister    Family Status  Relation Status  . Mother   . Father   . Sister     Immunization History  Administered Date(s) Administered  . Influenza,inj,Quad PF,36+ Mos 12/06/2013  . Influenza-Unspecified 12/28/2015  . Pneumococcal Polysaccharide-23 12/06/2013    No Known Allergies  Medications: Patient's Medications  New Prescriptions   No medications on file  Previous Medications   ACETAMINOPHEN (TYLENOL) 500 MG TABLET    Take 500 mg by mouth every 6 (six) hours as needed.   ALBUTEROL (PROVENTIL) (2.5 MG/3ML) 0.083% NEBULIZER SOLUTION    Take 2.5 mg by nebulization every 6 (six) hours as needed for wheezing or shortness of breath.   ASPIRIN EC 81 MG TABLET    Take 81 mg by mouth daily.   ATORVASTATIN (LIPITOR) 20 MG TABLET    Take 20 mg by mouth at bedtime.   CHOLECALCIFEROL (VITAMIN D) 1000 UNITS TABLET    Take 2,000 Units by mouth daily.   DIVALPROEX (DEPAKOTE SPRINKLE) 125 MG CAPSULE    Take 250  mg by mouth 3 (three) times daily.    ENALAPRIL (VASOTEC) 20 MG TABLET    Take 20 mg by mouth daily.   FERROUS SULFATE 325 (65 FE) MG TABLET    Take 325 mg by mouth daily with breakfast.   GUAIFENESIN (ROBITUSSIN) 100 MG/5ML LIQUID    Take 200 mg by mouth every 6 (six) hours as needed for cough.   HYDRALAZINE (APRESOLINE) 10 MG TABLET    Take 10 mg by mouth 3 (three) times daily.   INSULIN ASPART (NOVOLOG) 100 UNIT/ML INJECTION    Inject 10 Units into the skin 3 (three) times daily with meals. Inject as per sliding scale: if  0-59=0 Call MD ; 60-150 = 10 units, 151-449= 15 units. Call MD for CBG < 60 or > 450, subcutaneously before meals related to DM.    INSULIN GLARGINE (LANTUS) 100 UNIT/ML INJECTION    Inject 5 Units into the skin at bedtime.   LINAGLIPTIN (TRADJENTA) 5 MG TABS TABLET    Take 5 mg by mouth daily.     MEMANTINE HCL-DONEPEZIL HCL (NAMZARIC) 28-10 MG CP24    Take 1 capsule by mouth at bedtime.   MULTIPLE VITAMINS-MINERALS (DECUBI-VITE) CAPS    Take 1 capsule by mouth daily.  Modified Medications   No medications on file  Discontinued Medications   No medications on file    Review of Systems  Unable to perform ROS: Dementia    Vitals:   07/10/16 1528  BP: (!) 126/55  Pulse: 76  SpO2: 98%   There is no height or weight on file to calculate BMI.  Physical Exam  Constitutional: He appears well-developed.  Frail appearing in NAD, lying in bed, HOH  Cardiovascular:  Pulses:      Dorsalis pedis pulses are 2+ on the right side, and 2+ on the left side.       Posterior tibial pulses are 2+ on the right side, and 2+ on the left side.  Musculoskeletal: He exhibits edema.  Neurological: He is alert.  Skin: Skin is warm and dry. No rash noted.  Right 1st anterior toe with stage 1-2 ulcerated callus. No d/c. (+) TTP and swelling.  Intact DP/PT pulses. Right heel stage 4 pressure wound followed by wound care  Psychiatric: He has a normal mood and affect. His behavior is normal.     Labs reviewed: Nursing Home on 06/29/2016  Component Date Value Ref Range Status  . Microalb, Ur 06/14/2016 12.5   Final  . Hemoglobin 06/14/2016 11.9* 13.5 - 17.5 g/dL Final  . HCT 47/82/956207/27/2017 35* 41 - 53 % Final  . Platelets 06/14/2016 112* 150 - 399 K/L Final  . WBC 06/14/2016 5.7  10^3/mL Final  . Glucose 06/14/2016 158  mg/dL Final  . BUN 13/08/657807/27/2017 39* 4 - 21 mg/dL Final  . Creatinine 46/96/295207/27/2017 1.5* 0.6 - 1.3 mg/dL Final  . Potassium 84/13/244007/27/2017 5.1  3.4 - 5.3 mmol/L Final  . Sodium 06/14/2016 141  137 - 147 mmol/L Final  . Alkaline Phosphatase 06/14/2016 57  25 - 125 U/L Final  . ALT 06/14/2016 6* 10 - 40 U/L Final  . AST 06/14/2016 9* 14 - 40 U/L Final  . Bilirubin, Total 06/14/2016 0.3  mg/dL Final  . Hemoglobin N0UA1C 06/14/2016 6.8   Final    No results found.   Assessment/Plan    ICD-9-CM ICD-10-CM   1. Diabetic ulcer of right great toe (HCC) 250.80 E11.621    707.15 L97.519   2. Stage  IV pressure ulcer of right heel (HCC) 707.07 L89.614    707.24    3. 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 Vascular dementia without behavioral disturbance 290.40 F01.50     check right 1st toe xray to r/o osteo  Will place on facility podiatrist list   Cont other meds as ordered  Wound care as ordered  Will follow   Micaiah Litle S. Ancil Linseyarter, D. O., F. A. C. O. I.  [facility] [address] [place], [place] [address] [phone] Cell (Monday-Friday 8 AM - 5 PM) [phone] After 5 PM and follow prompts

## 2016-07-17 DIAGNOSIS — L89614 Pressure ulcer of right heel, stage 4: Secondary | ICD-10-CM | POA: Insufficient documentation

## 2016-07-20 ENCOUNTER — Encounter: Payer: Self-pay | Admitting: Internal Medicine

## 2016-07-20 ENCOUNTER — Non-Acute Institutional Stay (SKILLED_NURSING_FACILITY): Payer: Medicare Other | Admitting: Internal Medicine

## 2016-07-20 DIAGNOSIS — I5022 Chronic systolic (congestive) heart failure: Secondary | ICD-10-CM | POA: Diagnosis not present

## 2016-07-20 DIAGNOSIS — F0150 Vascular dementia without behavioral disturbance: Secondary | ICD-10-CM

## 2016-07-20 DIAGNOSIS — E118 Type 2 diabetes mellitus with unspecified complications: Secondary | ICD-10-CM | POA: Diagnosis not present

## 2016-07-20 DIAGNOSIS — L089 Local infection of the skin and subcutaneous tissue, unspecified: Secondary | ICD-10-CM

## 2016-07-20 DIAGNOSIS — D638 Anemia in other chronic diseases classified elsewhere: Secondary | ICD-10-CM

## 2016-07-20 DIAGNOSIS — I25709 Atherosclerosis of coronary artery bypass graft(s), unspecified, with unspecified angina pectoris: Secondary | ICD-10-CM

## 2016-07-20 NOTE — Progress Notes (Signed)
Location:   [facility] Room Number: 226/A Place of Service:  SNF (31) Provider:  Claudell Kyle, MD  Patient Care Team: Florentina Jenny, MD as PCP - General (Family Medicine)  Extended Emergency Contact Information Primary Emergency Contact: Wendelyn Breslow [place] Home Phone: [phone] Mobile Phone: [phone] Relation: Sister  Code Status:  MOST Goals of care: Advanced Directive information Advanced Directives 07/20/2016  Does patient have an advance directive? Yes  Type of Advance Directive Out of facility DNR (pink MOST or yellow form)  Does patient want to make changes to advanced directive? No - Patient declined  Copy of advanced directive(s) in chart? Yes  Would patient like information on creating an advanced directive? -     Chief Complaint  Patient presents with  . Medical Management of Chronic Issues    Routine Visit   MEDLMANAGEMNT OFCHRONIC MEICALCONONIUDIG DIABETES TYE 2Anemia of chronic disease hypertension coronary artery disease vascular dementia hypernatremia CHF.  Acute visit secondary to inflamed appearing right great tell HPI:  Pt is a 82 y.o. male seen today for medical management of chronic diseases.  As noted above.  Patient also appears to have an inflamed appearing right great toe-he was seen by the wound care physician in the facility yesterday it appears she is receiving Silvadene ointment at this point with recommendation to start an antibiotic a poor historian secondary to dementia nursing staff does not report any acute concerns.  He is a type II diabetic on Tradjenta as well as NovoLog insulin-CBGs appear to be in the higher 100s in the a.m.-somewhat more variability later in the day with some readings in the low to mid 200s-his hemoglobin A1c was significantly improved at 6.8 on lab done last month on 06/14/2016 previous hemoglobin A1c was  9.6  Patient does have a history of dementia  appears to be doing well with supportive care weight appears to be stable at 184-. He is on Namenda as well as Depakote-  In regards to coronary artery disease he is on aspirin as well as a statin apparently this is been asymptomatic without complaints of shortness breath or chest pain.  Regards to CHF his weight has been stable he is not on a diuretic he is on hydralazine 10 mg 3 times a day-weight has been stable edema appears to be scant.     Past Medical History:  Diagnosis Date  . CAD (coronary artery disease) of bypass graft    Multivessel  . Cataract    bilateral  . CHF (congestive heart failure) (HCC)   . Dementia    MMSE 18/30 Feb 2012 [facility])  . Diabetes mellitus   . Hearing deficit   . Hyperlipidemia   . Hypertension   . PVD (peripheral vascular disease) (HCC)    Past Surgical History:  Procedure Laterality Date  . CORONARY ARTERY BYPASS GRAFT      No Known Allergies    Medication List       Accurate as of 07/20/16  9:53 AM. Always use your most recent med list.          acetaminophen 500 MG tablet Commonly known as:  TYLENOL Take 500 mg by mouth every 6 (six) hours as needed.   albuterol (2.5 MG/3ML) 0.083% nebulizer solution Commonly known as:  PROVENTIL Take 2.5 mg by nebulization every 6 (six) hours as needed for wheezing or shortness of breath.   aspirin EC 81 MG tablet Take 81 mg by mouth daily.  atorvastatin 20 MG tablet Commonly known as:  LIPITOR Take 20 mg by mouth at bedtime.   cholecalciferol 1000 units tablet Commonly known as:  VITAMIN D Take 2,000 Units by mouth daily.   divalproex 125 MG capsule Commonly known as:  DEPAKOTE SPRINKLE Take 250 mg by mouth 3 (three) times daily.   enalapril 20 MG tablet Commonly known as:  VASOTEC Take 20 mg by mouth daily.   ferrous sulfate 325 (65 FE) MG tablet Take 325 mg by mouth daily with breakfast.   guaiFENesin 100 MG/5ML liquid Commonly known as:  ROBITUSSIN Take 200 mg by  mouth every 6 (six) hours as needed for cough.   hydrALAZINE 10 MG tablet Commonly known as:  APRESOLINE Take 10 mg by mouth 3 (three) times daily.   insulin glargine 100 UNIT/ML injection Commonly known as:  LANTUS Inject 5 Units into the skin at bedtime.   linagliptin 5 MG Tabs tablet Commonly known as:  TRADJENTA Take 5 mg by mouth daily.   NAMZARIC 28-10 MG Cp24 Generic drug:  Memantine HCl-Donepezil HCl Take 1 capsule by mouth at bedtime.   NOVOLOG 100 UNIT/ML injection Generic drug:  insulin aspart Inject 10 Units into the skin 3 (three) times daily with meals. Inject as per sliding scale: if  0-59=0 Call MD ; 60-150 = 10 units, 151-449= 15 units. Call MD for CBG < 60 or > 450, subcutaneously before meals related to DM.       Review of Systems  Essentially unattainable secondary to dementia please see history of present illness again there been no complaints of shortness of breath chest pain weight has been stable-again he does appear to have an inflamed appearing right great tell   Immunization History  Administered Date(s) Administered  . Influenza,inj,Quad PF,36+ Mos 12/06/2013  . Influenza-Unspecified 12/28/2015  . Pneumococcal Polysaccharide-23 12/06/2013   Pertinent  Health Maintenance Due  Topic Date Due  . FOOT EXAM  04/02/1942  . OPHTHALMOLOGY EXAM  04/02/1942  . INFLUENZA VACCINE  01/11/2044 (Originally 06/19/2016)  . PNA vac Low Risk Adult (2 of 2 - PCV13) 01/11/2044 (Originally 12/06/2014)  . HEMOGLOBIN A1C  12/15/2016   No flowsheet data found. Functional Status Survey:    Vitals:   07/20/16 0937  BP: 134/75  Pulse: 68  Resp: 20  Temp: 98.2 F (36.8 C)  TempSrc: Oral  SpO2: 98%  Weight: 184 lb (83.5 kg)  Height: 5\' 8"  (1.727 m)   Body mass index is 27.98 kg/m. Physical Exam   Constitutional: No distress. Currently lying in bed comfortably   HIS SKIN ISWRM AND DRY E DO chronic ruddy changes to his cheeks bilaterally-he has an inflamed  erythematous appearing right great toe there appears to be Silvadene ointment applied at this time-this is followed by the wound care physician there is some tenderness to palpation I do not see any overt exudate  Eyes: Conjunctivae are normal.  Neck: Neck supple. No JVD present. No thyromegaly present Oropharynx is clear mucous membranes moist.  Cardiovascular: Normal rate, regular rhythm do so pedal pulses bilaterally    Respiratory: Effort normal and breath sounds normal. No respiratory distress. He has no wheezes.  GI: Soft. Bowel sounds are normal. He exhibits no distension. There is no tenderness.  Musculoskeletal: He exhibits no edema.  Able to move all extremities     Neurological: He is alert. Could not really appreciate any lateralizing findings his speech is clear     PsychiatriC--consistent with dementia he is somewhat agitated  with exam     Labs reviewed:  Recent Labs  11/04/15 0340 11/05/15 0516 11/06/15 0518 11/17/15 06/14/16  NA 146* 147* 151* 143 141  K 3.8 4.0 3.6 3.9 5.1  CL 120* 123* 125*  --   --   CO2 16* 14* 19*  --   --   GLUCOSE 114* 145* 161*  --   --   BUN 32* 21* 17 18 39*  CREATININE 1.57* 1.18 1.05 0.9 1.5*  CALCIUM 7.9* 7.9* 8.2*  --   --   MG 1.4* 1.5* 1.9  --   --     Recent Labs  11/02/15 2316 11/03/15 1024 11/04/15 0340 06/14/16  AST 25 26 32 9*  ALT 32 26 27 6*  ALKPHOS 66 49 50 57  BILITOT 0.6 0.7 0.8  --   PROT 7.5 5.8* 5.7*  --   ALBUMIN 4.0 3.0* 2.8*  --     Recent Labs  11/03/15 1024 11/04/15 0340 11/05/15 0516 11/06/15 0518 11/17/15 06/14/16  WBC 6.7 6.8 7.6 8.5 6.3 5.7  NEUTROABS 4.6 3.7  --   --   --   --   HGB 12.6* 11.3* 11.0* 11.0* 12.4* 11.9*  HCT 39.5 34.1* 33.4* 33.7* 39* 35*  MCV 99.0 98.3 96.5 96.6  --   --   PLT 137* 138* 141* 149* 185 112*  7100/b Z Lab Results  Component Value Date   TSH 0.614 11/03/2015   Lab Results  Component Value Date   HGBA1C 6.8 06/14/2016   Lab Results  Component Value  Date   CHOL 175 12/04/2013   HDL 20 (L) 12/04/2013   LDLCALC 119 (H) 12/04/2013   TRIG 180 (H) 12/04/2013   CHOLHDL 8.8 12/04/2013    Significant Diagnostic Results in last 30 days:  No results found.  Assessment/Plan    1. Dyslipidemia: will continue lipitor 20 mg daily ldl is 74  Recent lab will update lipid panel  2. Anemia of chronic disease: hgb is 11.9-- will continue iron daily --hemoglobin appears to be relatively stable in no platelets are somewhat reduced at 112,000 will update this to ensure stability  3. Hypertension: will continue hydralazine 10 mg three times daily   Recent blood pressure 134/75 appears to be somewhat improved at this point monitor r Has calcification in bilateral renal arteries   4. CAD: status post cabg: no complaint of chest pain present; will continue asa 81 mg daily   5. Vascular dementia: no change in status; his current weight is 184 pounds; will continue aricept 10 mg nightly will continue namenda xr 28 mg daily  will continue depakote sprinkles 250 mg three times daily to stabilize mood   6. Diabetes: will continue novolog 10 units with meals for cbg >=150 tradjenta 5 mg daily  hgb a1c 6.8 shows significant improvement   7. Bronchospasm: is stable will continue albuterol neb every 6 hours as needed and robitussin 200 mg every 6 hours as needed  8. Hypernatremia: his sodium tablets have been stopped; will resolve this problem will update a metabolic panel   Note  potassium 5.1 was high normal on recent lab  9. CHF: he is presently stable will continue hydralazine 10 mg three times daily is presently not on diuretic will monitor--weights have been stable  #10 what appears to be an inflamed right great toe---this is followed by the wound care physician the facility will start antibiotic doxycycline 100 mg twice a day for 10 days-also probiotic twice a  day for 10 days and monitor.  Of note labs obtained will be a CBC follow-up hemoglobin  and thrombocytopenia-BMP with history of hypertension in review of electrolytes-and a fasting lipid panel with history of hyperlipidemia. Also will update Depakote level  [id number]-of note greater than 40 minutes spent assessing patient-discussing his status with nursing staff-reviewing his labs-his chart- coordinating and formulating a plan of care for numerous diagnoses-no greater than 50% of time spent coordinating plan of care

## 2016-08-28 ENCOUNTER — Non-Acute Institutional Stay (SKILLED_NURSING_FACILITY): Payer: Medicare Other | Admitting: Internal Medicine

## 2016-08-28 DIAGNOSIS — D631 Anemia in chronic kidney disease: Secondary | ICD-10-CM

## 2016-08-28 DIAGNOSIS — E1121 Type 2 diabetes mellitus with diabetic nephropathy: Secondary | ICD-10-CM | POA: Diagnosis not present

## 2016-08-28 DIAGNOSIS — I10 Essential (primary) hypertension: Secondary | ICD-10-CM

## 2016-08-28 DIAGNOSIS — F0150 Vascular dementia without behavioral disturbance: Secondary | ICD-10-CM

## 2016-08-28 DIAGNOSIS — N189 Chronic kidney disease, unspecified: Secondary | ICD-10-CM

## 2016-08-28 DIAGNOSIS — I5022 Chronic systolic (congestive) heart failure: Secondary | ICD-10-CM

## 2016-08-28 NOTE — Progress Notes (Signed)
This is a routine visit.  Level care skilled.  Facility is [facility] ([facility]  Chief complaint medical management of chronic medical conditions including diabetes type 2-anemia of chronic disease-hypertension-coronary artery disease-vascular dementia-hypernatremia-CHF-.  History of present illness.  Patient is an 82 year old male seen today for medical management of chronic diseases as noted above. I do not note recent acute issues-he was seen approximately a month ago for an inflamed appearing right Travis BlaseCraig Coleman this was followed by the wound care physician the facility and he was started on a short course of doxycycline.  Per exam today this appears to be essentially resolved.  He is not complaining of any toe pain.  He does have a history of dementia this appears to be stable with supportive care-he continues on Namenda as well as Depakote weight appears to be relatively stable at 182   He is a type II diabetic on Tradjenta as well as NovoLog insulin-hemoglobin A1c has shown significant improvement it was 6.8 on the lab done on 06/14/2016 previous hemoglobin A1c was 9.6. CBGs in the morning appear to be higher 100s most recently 1 84-2 36-1 79-1 76-1 60-1 42-1 32.  Later in the day blood sugars appear to be fairly consistently in the lower 200s sometimes above 300-occasionally in the higher 100s.  He is on Lantus 5 units a day-Tradjenta 5 units a day and aggressive NovoLog sliding scale  He does have a history of coronary artery disease he is on aspirin as well as a statin this is been asymptomatic without complaints of chest pain or shortness of breath.  In regards to CHF again his weight has been stable he is not on a diuretic he is on hydralazine 10 mg 3 times a day-continues to have quite scant edema.  Past Medical History:  Diagnosis Date  . CAD (coronary artery disease) of bypass graft    Multivessel  . Cataract    bilateral  . CHF (congestive heart failure)  (HCC)   . Dementia    MMSE 18/30 Feb 2012 [facility](Hospital)  . Diabetes mellitus   . Hearing deficit   . Hyperlipidemia   . Hypertension   . PVD (peripheral vascular disease) (HCC)         Past Surgical History:  Procedure Laterality Date  . CORONARY ARTERY BYPASS GRAFT      No Known Allergies        Medication List                    acetaminophen 500 MG tablet Commonly known as:  TYLENOL Take 500 mg by mouth every 6 (six) hours as needed.  albuterol (2.5 MG/3ML) 0.083% nebulizer solution Commonly known as:  PROVENTIL Take 2.5 mg by nebulization every 6 (six) hours as needed for wheezing or shortness of breath.  aspirin EC 81 MG tablet Take 81 mg by mouth daily.  atorvastatin 20 MG tablet Commonly known as:  LIPITOR Take 20 mg by mouth at bedtime.  cholecalciferol 1000 units tablet Commonly known as:  VITAMIN D Take 2,000 Units by mouth daily.  divalproex 125 MG capsule Commonly known as:  DEPAKOTE SPRINKLE Take 250 mg by mouth 3 (three) times daily.  enalapril 20 MG tablet Commonly known as:  VASOTEC Take 20 mg by mouth daily.  ferrous sulfate 325 (65 FE) MG tablet Take 325 mg by mouth daily with breakfast.  guaiFENesin 100 MG/5ML liquid Commonly known as:  ROBITUSSIN Take 200 mg by mouth every 6 (six) hours  as needed for cough.  hydrALAZINE 10 MG tablet Commonly known as:  APRESOLINE Take 10 mg by mouth 3 (three) times daily.  insulin glargine 100 UNIT/ML injection Commonly known as:  LANTUS Inject 5 Units into the skin at bedtime.  linagliptin 5 MG Tabs tablet Commonly known as:  TRADJENTA Take 5 mg by mouth daily.  NAMZARIC 28-10 MG Cp24 Generic drug:  Memantine HCl-Donepezil HCl Take 1 capsule by mouth at bedtime.  NOVOLOG 100 UNIT/ML injection Generic drug:  insulin aspart Inject 10 Units into the skin 3 (three) times daily with meals. Inject as per sliding scale: if  0-59=0 Call MD ; 60-150 = 10 units, 151-449= 15 units. Call MD  for CBG < 60 or > 450, subcutaneously before meals related to DM.      Review of Systems  Essentially unattainable secondary to dementia please see history of present illness again there been no complaints of shortness of breath chest pain weight has been stable-       Immunization History  Administered Date(s) Administered  . Influenza,inj,Quad PF,36+ Mos 12/06/2013  . Influenza-Unspecified 12/28/2015  . Pneumococcal Polysaccharide-23 12/06/2013       Pertinent  Health Maintenance Due  Topic Date Due  . FOOT EXAM  04/02/1942  . OPHTHALMOLOGY EXAM  04/02/1942  . INFLUENZA VACCINE  01/11/2044 (Originally 06/19/2016)  . PNA vac Low Risk Adult (2 of 2 - PCV13) 01/11/2044 (Originally 12/06/2014)  . HEMOGLOBIN A1C  12/15/2016   No flowsheet data found. Functional Status Survey:   Temp  98.2 pulse 68 respirations 20 blood pressure 117/76 weight appears to be stable at 182.3  Physical Exam   Constitutional: No distress. Currently lying in bed comfortably   HIS SKIN ISWRM AND DRY E DO chronic ruddy changes to his cheeks bilaterally- Right great toe erythema appears essentially resolved there is some pale residual erythema and no tenderness to palpation or significant edema  He does have a beard- Eyes: Conjunctivae are normal.  Neck: Neck supple. No JVD present. No thyromegaly present Oropharynx is clear mucous membranes moist.  Cardiovascular: Normal rate, regular rhythm  somewhat distant heart sounds -- pedal pulses bilaterally palpable   Respiratory: Effort normal and breath sounds normal. No respiratory distress. He has no wheezes.  GI: Soft. Bowel sounds are normal. He exhibits no distension. There is no tenderness.  Musculoskeletal: He exhibits no edema.  Able to move all extremities    Neurological: He is alert. Could not really appreciate any lateralizing findings his speech is clear    PsychiatriC--consistent with dementia was cooperative with exam   Labs  reviewed:  07/21/2016.  Sodium 139 potassium 4.5 BUN 38 creatinine 1.41.  WBC 7.6 hemoglobin 10.9 platelets 183.  Lester 126-triglycerides 262-HDL 25-LDL 49-.  Depakote  level X.    Recent Labs (within last 365 days)   Recent Labs  11/04/15 0340 11/05/15 0516 11/06/15 0518 11/17/15 06/14/16  NA 146* 147* 151* 143 141  K 3.8 4.0 3.6 3.9 5.1  CL 120* 123* 125*  --   --   CO2 16* 14* 19*  --   --   GLUCOSE 114* 145* 161*  --   --   BUN 32* 21* 17 18 39*  CREATININE 1.57* 1.18 1.05 0.9 1.5*  CALCIUM 7.9* 7.9* 8.2*  --   --   MG 1.4* 1.5* 1.9  --   --       Recent Labs (within last 365 days)   Recent Labs  11/02/15 2316 11/03/15  1024 11/04/15 0340 06/14/16  AST 25 26 32 9*  ALT 32 26 27 6*  ALKPHOS 66 49 50 57  BILITOT 0.6 0.7 0.8  --   PROT 7.5 5.8* 5.7*  --   ALBUMIN 4.0 3.0* 2.8*  --       Recent Labs (within last 365 days)   Recent Labs  11/03/15 1024 11/04/15 0340 11/05/15 0516 11/06/15 0518 11/17/15 06/14/16  WBC 6.7 6.8 7.6 8.5 6.3 5.7  NEUTROABS 4.6 3.7  --   --   --   --   HGB 12.6* 11.3* 11.0* 11.0* 12.4* 11.9*  HCT 39.5 34.1* 33.4* 33.7* 39* 35*  MCV 99.0 98.3 96.5 96.6  --   --   PLT 137* 138* 141* 149* 185 112*    7100/b Z Recent Labs       Lab Results  Component Value Date   TSH 0.614 11/03/2015     Recent Labs       Lab Results  Component Value Date   HGBA1C 6.8 06/14/2016     Recent Labs       Lab Results  Component Value Date   CHOL 175 12/04/2013   HDL 20 (L) 12/04/2013   LDLCALC 119 (H) 12/04/2013   TRIG 180 (H) 12/04/2013   CHOLHDL 8.8 12/04/2013      Significant Diagnostic Results in last 30 days:  Imaging Results  No results found.    Assessment/Plan    1. Dyslipidemia: will continue lipitor 20 mg daily ldl is 49 Liver function tests within normal limits on 06/14/2016  2. Anemia of chronic disease: hgb is 10.9-- will continue iron daily --hemoglobin appears to be relatively  stable but at the lower end of baseline Will update this.  Platelets have shown improvement at 183,000  3. Hypertension: will continue hydralazine 10 mg three times daily  Recent blood pressure 117/76 appears to be somewhat improved at this point monitor r Has calcification in bilateral renal arteries   4. CAD: status post cabg: no complaint of chest pain present; will continue asa 81 mg daily   5. Vascular dementia: no change in status; his current weight is 184 pounds; will continue aricept 10 mg nightly will continue namenda xr 28 mg daily will continue depakote sprinkles 250 mg three times daily to stabilize mood   6. Diabetes: hasNovolog10 units with meals and additional units for elevated blood sugars--- with parameters to hold for lower blood sugars---also on    tradjenta 5 mg dailyAs well as  Lantus 5 units daily -CBGs appear to be somewhat elevated and later day parts fairly consistently above 200 will increase the Lantus up to 8 units and monitor --Hgb A1c back in late July of 6.8 has shown improvement    7. Bronchospasm: is stable will continue albuterol neb every 6 hours as needed   8. Hypernatremia: his sodium tablets have been stopped;s odium was 139 on lab done 07/21/2016 will update this to ensure stability  9. CHF: he is presently stable will continue hydralazine 10 mg three times daily is presently not on diuretic will monitor--weights have been stable   [id number]--of note greater than 35 minutes spent assessing patient-reviewing his labs-his chart-reviewing his medications and blood sugars-and coordinating--formulating   plan of care-of note greater than 50% of time spent coordinating plan  Of care  for numerous diagnoses

## 2016-09-04 ENCOUNTER — Non-Acute Institutional Stay (SKILLED_NURSING_FACILITY): Payer: Medicare Other | Admitting: Adult Health

## 2016-09-04 ENCOUNTER — Encounter: Payer: Self-pay | Admitting: Adult Health

## 2016-09-04 DIAGNOSIS — L03031 Cellulitis of right toe: Secondary | ICD-10-CM | POA: Diagnosis not present

## 2016-09-04 DIAGNOSIS — I739 Peripheral vascular disease, unspecified: Secondary | ICD-10-CM

## 2016-09-04 NOTE — Progress Notes (Signed)
Patient ID: Travis Coleman, male   DOB: 12/25/1931, 82 y.o.   MRN: [id number]   Location:   [facility] Room Number: 226-A Place of Service:  SNF (31)   CODE STATUS: Full Code  No Known Allergies  Chief Complaint  Patient presents with  . Acute Visit    Right 1st toe pain    HPI:  He is having right great toe pain. The toe is red; hot and inflamed. He does have an arterial wound on the right great toe. There is concern for infection. There are no reports of fever present.   Past Medical History:  Diagnosis Date  . CAD (coronary artery disease) of bypass graft    Multivessel  . Cataract    bilateral  . CHF (congestive heart failure) (HCC)   . Dementia    MMSE 18/30 Feb 2012 [facility](Hospital)  . Diabetes mellitus   . Hearing deficit   . Hyperlipidemia   . Hypertension   . PVD (peripheral vascular disease) (HCC)     Past Surgical History:  Procedure Laterality Date  . CORONARY ARTERY BYPASS GRAFT      Social History   Social History  . Marital status: Single    Spouse name: N/A  . Number of children: N/A  . Years of education: N/A   Occupational History  . Not on file.   Social History Main Topics  . Smoking status: Former Smoker    Quit date: 11/19/1948  . Smokeless tobacco: Never Used  . Alcohol use No  . Drug use: No  . Sexual activity: Not Currently   Other Topics Concern  . Not on file   Social History Narrative  . No narrative on file   Family History  Problem Relation Age of Onset  . Diabetes Mother   . Dementia Mother     alzheimer  . Dementia Father   . Stroke Father   . Heart disease Father   . Dementia Sister       VITAL SIGNS BP (!) 167/81   Pulse 68   Temp 98.2 F (36.8 C) (Oral)   Resp 20   Ht 5\' 8"  (1.727 m)   Wt 181 lb 6 oz (82.3 kg)   SpO2 98%   BMI 27.58 kg/m   Patient's Medications  New Prescriptions   No medications on file  Previous Medications   ACETAMINOPHEN (TYLENOL) 500 MG TABLET    Take 500 mg by  mouth every 6 (six) hours as needed.   ASPIRIN EC 81 MG TABLET    Take 81 mg by mouth daily.   ATORVASTATIN (LIPITOR) 20 MG TABLET    Take 20 mg by mouth at bedtime.   CHOLECALCIFEROL (VITAMIN D) 1000 UNITS TABLET    Take 2,000 Units by mouth daily.   DIVALPROEX (DEPAKOTE SPRINKLE) 125 MG CAPSULE    Take 250 mg by mouth 3 (three) times daily.    ENALAPRIL (VASOTEC) 20 MG TABLET    Take 20 mg by mouth daily.   FERROUS SULFATE 325 (65 FE) MG TABLET    Take 325 mg by mouth daily with breakfast.   GUAIFENESIN (ROBITUSSIN) 100 MG/5ML LIQUID    Take 200 mg by mouth every 6 (six) hours as needed for cough.   HYDRALAZINE (APRESOLINE) 10 MG TABLET    Take 10 mg by mouth 3 (three) times daily.   INSULIN ASPART (NOVOLOG) 100 UNIT/ML INJECTION    Inject 10 Units into the skin 3 (three) times daily  with meals. Inject as per sliding scale: if  0-59=0 Call MD ; 60-150 = 10 units, 151-449= 15 units. Call MD for CBG < 60 or > 450, subcutaneously before meals related to DM.    INSULIN GLARGINE (LANTUS) 100 UNIT/ML INJECTION    Inject 8 Units into the skin at bedtime.    LINAGLIPTIN (TRADJENTA) 5 MG TABS TABLET    Take 5 mg by mouth daily.   MEMANTINE HCL-DONEPEZIL HCL (NAMZARIC) 28-10 MG CP24    Take 1 capsule by mouth at bedtime.   MULTIPLE VITAMINS-MINERALS (DECUBI-VITE) CAPS    Take 1 capsule by mouth daily.  Modified Medications   No medications on file  Discontinued Medications   ALBUTEROL (PROVENTIL) (2.5 MG/3ML) 0.083% NEBULIZER SOLUTION    Take 2.5 mg by nebulization every 6 (six) hours as needed for wheezing or shortness of breath.     SIGNIFICANT DIAGNOSTIC EXAMS  11-03-15: chest x-ray; Shallow inspiration with atelectasis in the lung bases. Cardiac enlargement. No evidence of active pulmonary disease.   11-03-15: ct of abdomen and pelvis: 1. No definite acute abnormality seen within the abdomen or pelvis. 2. Trace fluid at the right lower quadrant is nonspecific. 3. Diffuse calcification along  the abdominal aorta and its branches, including along the superior mesenteric artery, inferior mesenteric artery and at the proximal renal arteries bilaterally. 4. Mild bibasilar atelectasis noted. 5. Diffuse coronary artery calcifications seen. 6. Mild bilateral renal atrophy noted.  11-04-15: 2-d echo: Left ventricle: The cavity size was normal. There was mild concentric hypertrophy. Systolic function was normal. The estimated ejection fraction was in the range of 50% to 55%. Wall motion was normal; there were no regional wall motion abnormalities. Doppler parameters are consistent with abnormal left ventricular relaxation (grade 1 diastolic dysfunction). - Aortic valve: Valve mobility was restricted. Transvalvular velocity was increased, due to stenosis. There was mild stenosis. - Mitral valve: Calcified annulus. There was mild regurgitation. - Left atrium: The atrium was mildly dilated. - Right atrium: The atrium was mildly dilated.  02-04-16: right lower extremity doppler: negative for dvt     LABS REVIEWED:   11-02-15: wbc 4.7; hgb 13.2; hct 40.8; mcv 98.8; plt 167; glucose 60; bun 49; creat 1.90; k+ 4.2; na++146; liver normal albumin 4.0; lipase 20; tsh 0.614: urine culture: no growth: c-diff: neg  GI pathogen panel: negative 11-04-15: wbc 6.8; hgb 11.3; hct 34.1; mcv 98.3; plt 138; glucose 114; bun 32; creat 1.57; k+ 3.8; na++146; liver normal albumin 2.8 11-05-15: wbc 7.6; hgb 11.0; hct 33.4; mcv 96.5; plt 141; glucose 145; bun 21; creat 1.18; k+ 4.0; na++147; mag 1.5  11-06-15: wbc 8.5; hgb 11.0; hct 33.7; mcv 96.6; plt 149; glucose 161; bun 17; creat 1.05; k+ 3.6; na++151  11-17-15: wbc 6.3; hgb 12.4; hct 39.4; mcv 95.7; plt 185  01-12-16: hgb a1c 9.6; tsh 0.99; chol 145; ldl 74; trig 196; hdl 32  06-29-90: wbc 5.7; hgb 11.9; hct 34.5; mcv 92.8; plt 112; glucose 158; bun 39; creat 1.52; k+ 5.1; na++ 141; liver normal albumin 3.4; hgb a1c 6.8 urine micro-albumin 12.5   Review of  Systems  Unable to perform ROS: dementia    Physical Exam  Constitutional: No distress.  Overweight   Eyes: Conjunctivae are normal.  Neck: Neck supple. No JVD present. No thyromegaly present.  Cardiovascular: Normal rate, regular rhythm and intact distal pulses.   Respiratory: Effort normal and breath sounds normal. No respiratory distress. He has no wheezes.  GI: Soft. Bowel sounds are  normal. He exhibits no distension. There is no tenderness.  Musculoskeletal: 2+ lower extremity edema   Able to move all extremities   Lymphadenopathy:    He has no cervical adenopathy.  Neurological: He is alert.  Skin: Skin is warm and dry. He is not diaphoretic.   Right great toe arterial ulceration: 1.2 x 0.6 x 0.1 cm treated with betadine Right great toe is painful to touch; red hot and inflamed.  Psychiatric: He has a normal mood and affect.     ASSESSMENT/ PLAN:  1. Cellulitis 2. PVD Will begin keflex 500 mg twice daily for 2 weeks   Will begin neurontin 200 mg nightly for neuropathic pain Will begin tylenol 650 mg four times daily  Will monitor     MD is aware of resident's narcotic use and is in agreement with current plan of care. We will attempt to wean resident as apropriate   Synthia Innocent NP [facility]  Contact [phone] Monday through Friday 8am- 5pm  After hours call [phone]

## 2016-09-25 ENCOUNTER — Non-Acute Institutional Stay (SKILLED_NURSING_FACILITY): Payer: Medicare Other | Admitting: Internal Medicine

## 2016-09-25 DIAGNOSIS — I10 Essential (primary) hypertension: Secondary | ICD-10-CM | POA: Diagnosis not present

## 2016-09-25 DIAGNOSIS — F0150 Vascular dementia without behavioral disturbance: Secondary | ICD-10-CM | POA: Diagnosis not present

## 2016-09-25 DIAGNOSIS — D509 Iron deficiency anemia, unspecified: Secondary | ICD-10-CM

## 2016-09-25 DIAGNOSIS — E870 Hyperosmolality and hypernatremia: Secondary | ICD-10-CM | POA: Diagnosis not present

## 2016-09-25 DIAGNOSIS — E1121 Type 2 diabetes mellitus with diabetic nephropathy: Secondary | ICD-10-CM

## 2016-09-25 DIAGNOSIS — I5022 Chronic systolic (congestive) heart failure: Secondary | ICD-10-CM | POA: Diagnosis not present

## 2016-09-25 DIAGNOSIS — I209 Angina pectoris, unspecified: Secondary | ICD-10-CM

## 2016-09-25 DIAGNOSIS — I25709 Atherosclerosis of coronary artery bypass graft(s), unspecified, with unspecified angina pectoris: Secondary | ICD-10-CM | POA: Diagnosis not present

## 2016-09-25 NOTE — Progress Notes (Signed)
This is a routine visit.  Level care skilled.  Facility Scientist, research (medical) complaint-medical management of chronic medical conditions-- including diabetes type 2-anemia of chronic disease-hypertension-coronary artery disease-vascular dementia-hypernatremia-CHF-.  History of present illness.  Patient is an 82 year old male seen today for medical management of chronic diseases as noted above.  Most recent acute issue has been an inflamed appearing right great toe which periodically  required antibiotics --recently  completed a course of Keflex recently and appears this is improved he still has some residual erythema of the toe but there is no drainage area of crusting appears to be dried it is less tender  He is not complaining of any toe pain.  He does have a history of dementia this appears to be stable with supportive care-he continues on Namenda as well as Depakote weight appears to be relatively stable at 182   He is a type II diabetic on Tradjenta as well as NovoLog insulin-hemoglobin A1c has shown significant improvement it was 6.8 on the lab done on 06/14/2016 previous hemoglobin A1c was 9.6--we will update this.  CBGs appear to be largely in the 100s range occasionally above 200 but this is not consistent  L   He is on Lantus 5 units a day-Tradjenta 5 units a day and NovoLog sliding scale  He does have a history of coronary artery disease he is on aspirin as well as a statin this is been asymptomatic without complaints of chest pain or shortness of breath.  In regards to CHF again his weight has been stable he is not on a diuretic he is on hydralazine 10 mg 3 times a day-continues to have quite scant edema. Of note is also on Vasotec 20 mg once a day blood pressure today appeared to be somewhat elevated taken manually was 180/60 machine gave a similar readings however nursing staff reports that usually when he gets his hydralazine which he is due for--  it comes down significantly      Past Medical History:  Diagnosis Date  . CAD (coronary artery disease) of bypass graft    Multivessel  . Cataract    bilateral  . CHF (congestive heart failure) (HCC)   . Dementia    MMSE 18/30 Feb 2012 [facility])  . Diabetes mellitus   . Hearing deficit   . Hyperlipidemia   . Hypertension   . PVD (peripheral vascular disease) (HCC)         Past Surgical History:  Procedure Laterality Date  . CORONARY ARTERY BYPASS GRAFT          No Known Allergies        Medication List                    acetaminophen500 MG tablet Commonly known as: TYLENOL Take 500 mg by mouth every 6 (six) hours as needed.  albuterol(2.5 MG/3ML) 0.083% nebulizer solution Commonly known as: PROVENTIL Take 2.5 mg by nebulization every 6 (six) hours as needed for wheezing or shortness of breath.  aspirin EC81 MG tablet Take 81 mg by mouth daily.  atorvastatin20 MG tablet Commonly known as: LIPITOR Take 20 mg by mouth at bedtime.  cholecalciferol1000 units tablet Commonly known as: VITAMIN D Take 2,000 Units by mouth daily.  divalproex125 MG capsule Commonly known as: DEPAKOTE SPRINKLE Take 250 mg by mouth 3 (three) times daily.  enalapril20 MG tablet Commonly known as: VASOTEC Take 20 mg by mouth daily.  ferrous sulfate325 (65 FE) MG tablet  Take 325 mg by mouth daily with breakfast.  guaiFENesin100 MG/5ML liquid Commonly known as: ROBITUSSIN Take 200 mg by mouth every 6 (six) hours as needed for cough.  hydrALAZINE10 MG tablet Commonly known as: APRESOLINE Take 10 mg by mouth 3 (three) times daily.  insulin glargine100 UNIT/ML injection Commonly known as: LANTUS Inject 5 Units into the skin at bedtime.  linagliptin5 MG Tabs tablet Commonly known as: TRADJENTA Take 5 mg by mouth daily.  NAMZARIC28-10 MG Cp24 Generic drug: Memantine HCl-Donepezil HCl Take 1 capsule by mouth at  bedtime.  NOVOLOG100 UNIT/ML injection Generic drug: insulin aspart Inject 10 Units into the skin 3 (three) times daily with meals. Inject as per sliding scale: if 0-59=0 Call MD ; 60-150 = 10 units, 151-449= 15 units. Call MD for CBG <60 or >450, subcutaneously before meals related to DM.      Review of Systems Essentially unattainable secondary to dementia please see history of present illness again there been no complaints of shortness of breath chest pain weight has been stable-He is not really complaining of any discomfort today resting in bed comfortably       Immunization History  Administered Date(s) Administered  . Influenza,inj,Quad PF,36+ Mos 12/06/2013  . Influenza-Unspecified 12/28/2015  . Pneumococcal Polysaccharide-23 12/06/2013       Pertinent Health Maintenance Due  Topic Date Due  . FOOT EXAM  04/02/1942  . OPHTHALMOLOGY EXAM  04/02/1942  . INFLUENZA VACCINE  01/11/2044 (Originally 06/19/2016)  . PNA vac Low Risk Adult (2 of 2 - PCV13) 01/11/2044 (Originally 12/06/2014)  . HEMOGLOBIN A1C  12/15/2016   No flowsheet data found. Functional Status Survey:   Temperature is 98.9 pulse 78 respirations 18 blood pressure listed 165/76 according in nursing once he gets his hydralazine blood pressure comes down  significantly systolically down to about 140-taken manually today systolic was 180   weight is stable at 182 pounds  Physical Exam  Constitutional: No distress. Currently lying in bed comfortably  HIS SKIN ISWRM AND DRY E DOchronic ruddy changes to his cheeks bilaterally-  Anterior right great toe has some residual erythema this does not extend to the back of the toe there is a central area of scabbing crusting with no drainage or odor or bleeding-there is very minimal tenderness to palpation-  He does have a beard- Eyes: Conjunctivae are normal.  Neck: Neck supple. No JVD present. No thyromegaly present Oropharynx is clear mucous  membranes moist.  Cardiovascular: Normal rate, regular rhythmwith an occasional skipped beat -- somewhat distant heart sounds -- pedal pulses bilaterally palpable  Respiratory: Effort normal and breath sounds normal. No respiratory distress. He has no wheezes.  GI: Soft. Bowel sounds are normal. He exhibits no distension. There is no tenderness.  Musculoskeletal: He exhibits no edema.  Able to move all extremities   Neurological: He is alert. Could not really appreciate any lateralizing findings his speech is clear   Psychiatri--consistent with dementia was cooperative with exam   Labs reviewed:  08/29/2016.  Sodium 143 potassium 4.6 BUN 51.6 creatinine 1.28.  WBC 5.1 hemoglobin 10.7 platelets 118  07/21/2016.  Sodium 139 potassium 4.5 BUN 38 creatinine 1.41.  WBC 7.6 hemoglobin 10.9 platelets 183.  Lester 126-triglycerides 262-HDL 25-LDL 49-.  Depakote  level X.    Recent Labs (within last 365 days)   Recent Labs  11/04/15 0340 11/05/15 0516 11/06/15 0518 11/17/15 06/14/16  NA 146* 147* 151* 143 141  K 3.8 4.0 3.6 3.9 5.1  CL 120* 123* 125* --  --  CO2 16* 14* 19* --  --   GLUCOSE 114* 145* 161* --  --   BUN 32* 21* 17 18 39*  CREATININE 1.57* 1.18 1.05 0.9 1.5*  CALCIUM 7.9* 7.9* 8.2* --  --   MG 1.4* 1.5* 1.9 --  --       Recent Labs (within last 365 days)   Recent Labs  11/02/15 2316 11/03/15 1024 11/04/15 0340 06/14/16  AST 25 26 32 9*  ALT 32 26 27 6*  ALKPHOS 66 49 50 57  BILITOT 0.6 0.7 0.8 --   PROT 7.5 5.8* 5.7* --   ALBUMIN 4.0 3.0* 2.8* --       Recent Labs (within last 365 days)   Recent Labs  11/03/15 1024 11/04/15 0340 11/05/15 0516 11/06/15 0518 11/17/15 06/14/16  WBC 6.7 6.8 7.6 8.5 6.3 5.7  NEUTROABS 4.6 3.7 --  --  --  --   HGB 12.6* 11.3* 11.0* 11.0* 12.4* 11.9*  HCT 39.5 34.1* 33.4* 33.7* 39* 35*  MCV 99.0 98.3 96.5 96.6 --  --   PLT 137* 138* 141* 149* 185 112*      7100/bZ Recent Labs       Lab Results  Component Value Date   TSH 0.614 11/03/2015     Recent Labs       Lab Results  Component Value Date   HGBA1C 6.8 06/14/2016     Recent Labs       Lab Results  Component Value Date   CHOL 175 12/04/2013   HDL 20 (L) 12/04/2013   LDLCALC 119 (H) 12/04/2013   TRIG 180 (H) 12/04/2013   CHOLHDL 8.8 12/04/2013      Significant Diagnostic Results in last 30 days:  Imaging Results  No results found.    Assessment/Plan  1. Dyslipidemia: will continue lipitor 20 mg daily ldl is 49 Liver function tests within normal limits on 06/14/2016  2. Anemia of chronic disease: hgb is 10.7--will continue iron daily --hemoglobin appears to be relatively stable but at the lower end of baseline Will update this.  Elease Hashimotoatricia platelet level CXVIII,000 appears to be at the lower end of baseline again will update this  3. Hypertension: will continue hydralazine 10 mg three times daily as well as Vasotec 20 mg a day  As noted above he has elevated systolics at times had apparently come down significant with the hydralazine will write an order to  Give    hydralazine doses 8 hours apart to see if this will give us more consistency follow-up as needed--check blood pressure every shift with log in provider book for review next week--- Has calcification in bilateral renal arteries   4. CAD: status post cabg: no complaint of chest pain present; will continue asa 81 mg daily   5. Vascular dementia: no change in status; his current weight is 182pounds; will continue aricept 10 mg nightly will continue namenda xr 28 mg daily will continue depakote sprinkles 250 mg three times daily to stabilize mood   6. Diabetes: hasNovolog10 units with meals and additional units for elevated blood sugars--- with parameters to hold for lower blood sugars---also on    tradjenta 5 mg dailyAs well as  Lantus 5 units daily   --Hgb A1c back in  late July of 6.8 has shown improvement --CBGs appear to be largely in the 100s will update hemoglobin A1c  7. Bronchospasm: is stable will continue albuterol neb every 6 hours as needed   8. Hypernatremia: his sodium tablets  have been stopped;s odium was 143 on lab done 08/29/2016 will update this to ensure stability  9. CHF: he is presently stable will continue hydralazine 10 mg three times daily is presently not on diuretic will monitor--weights have been stable  #10-history of right great toe wound again this appears to be improved status post Keflex will monitor for any recurrence-      Of note Will update a CBC hemoglobin A1c and metabolic panel next lab day-   [id number]--of note greater than  40 minutes spent assessing patient-reviewing his labs-his chart-reviewing his medications and blood sugars-discussing his status with nursing staff--and coordinating--formulating   plan of care-of note greater than 50% of time spent coordinating plan  Of care  for numerous diagnoses

## 2016-09-26 LAB — BASIC METABOLIC PANEL
BUN: 29 mg/dL — AB (ref 4–21)
CREATININE: 1.3 mg/dL (ref 0.6–1.3)
GLUCOSE: 155 mg/dL
Potassium: 5 mmol/L (ref 3.4–5.3)
Sodium: 144 mmol/L (ref 137–147)

## 2016-09-26 LAB — CBC AND DIFFERENTIAL
HCT: 37 % — AB (ref 41–53)
HEMOGLOBIN: 11.2 g/dL — AB (ref 13.5–17.5)
Neutrophils Absolute: 3 /uL
PLATELETS: 110 10*3/uL — AB (ref 150–399)
WBC: 6 10*3/mL

## 2016-09-26 LAB — HEMOGLOBIN A1C: HEMOGLOBIN A1C: 7.8

## 2016-09-26 LAB — HEPATIC FUNCTION PANEL
ALT: 9 U/L — AB (ref 10–40)
AST: 10 U/L — AB (ref 14–40)
Alkaline Phosphatase: 65 U/L (ref 25–125)
Bilirubin, Total: 0.2 mg/dL

## 2016-10-17 ENCOUNTER — Other Ambulatory Visit: Payer: Self-pay | Admitting: *Deleted

## 2016-10-17 DIAGNOSIS — I739 Peripheral vascular disease, unspecified: Secondary | ICD-10-CM

## 2016-10-24 ENCOUNTER — Encounter: Payer: Self-pay | Admitting: Surgery

## 2016-10-30 ENCOUNTER — Encounter (HOSPITAL_COMMUNITY): Payer: Medicare Other

## 2016-10-30 ENCOUNTER — Encounter (HOSPITAL_COMMUNITY): Payer: Self-pay | Admitting: Emergency Medicine

## 2016-10-30 ENCOUNTER — Emergency Department (HOSPITAL_COMMUNITY)
Admission: EM | Admit: 2016-10-30 | Discharge: 2016-10-30 | Disposition: A | Discharge status: Home / Self Care | Payer: Medicare Other | Attending: Emergency Medicine | Admitting: Emergency Medicine

## 2016-10-30 DIAGNOSIS — Z79899 Other long term (current) drug therapy: Secondary | ICD-10-CM | POA: Diagnosis not present

## 2016-10-30 DIAGNOSIS — Z87891 Personal history of nicotine dependence: Secondary | ICD-10-CM | POA: Insufficient documentation

## 2016-10-30 DIAGNOSIS — N183 Chronic kidney disease, stage 3 (moderate): Secondary | ICD-10-CM | POA: Diagnosis not present

## 2016-10-30 DIAGNOSIS — E1121 Type 2 diabetes mellitus with diabetic nephropathy: Secondary | ICD-10-CM | POA: Insufficient documentation

## 2016-10-30 DIAGNOSIS — Z794 Long term (current) use of insulin: Secondary | ICD-10-CM | POA: Insufficient documentation

## 2016-10-30 DIAGNOSIS — Z951 Presence of aortocoronary bypass graft: Secondary | ICD-10-CM | POA: Diagnosis not present

## 2016-10-30 DIAGNOSIS — I130 Hypertensive heart and chronic kidney disease with heart failure and stage 1 through stage 4 chronic kidney disease, or unspecified chronic kidney disease: Secondary | ICD-10-CM | POA: Insufficient documentation

## 2016-10-30 DIAGNOSIS — I5022 Chronic systolic (congestive) heart failure: Secondary | ICD-10-CM | POA: Diagnosis not present

## 2016-10-30 DIAGNOSIS — I2510 Atherosclerotic heart disease of native coronary artery without angina pectoris: Secondary | ICD-10-CM | POA: Diagnosis not present

## 2016-10-30 DIAGNOSIS — Z7982 Long term (current) use of aspirin: Secondary | ICD-10-CM | POA: Insufficient documentation

## 2016-10-30 DIAGNOSIS — I10 Essential (primary) hypertension: Secondary | ICD-10-CM

## 2016-10-30 NOTE — ED Triage Notes (Signed)
Pt arrives via [facility] from [facility], [facility] reports they arrived to pick patient up to transport him for vascular study and pts bp was found to be 212/70 in left arm and 196/80 in right arm. Pt has no other complaints. Nad.

## 2016-10-30 NOTE — ED Notes (Signed)
ED Provider at bedside. 

## 2016-10-30 NOTE — Discharge Instructions (Signed)
If you develop worsening high blood pressure with symptoms including shortness of breath, chest pain, headache, vision changes you should return to the emergency department for evaluation.   If you have high blood pressure without symptoms, talk to your primary care doctor regarding your blood pressure medicines.   Call vascular surgery clinic as soon as possible to re-schedule your imaging study.

## 2016-10-30 NOTE — ED Notes (Signed)
[facility] at bedside to transport patient back to [facility]

## 2016-10-30 NOTE — ED Provider Notes (Signed)
[facility]-EMERGENCY DEPT Provider Note   CSN: [id number] Arrival date & time: 10/30/16  1637     History   Chief Complaint Chief Complaint  Patient presents with  . Hypertension    HPI Travis Coleman is a 82 y.o. male.  HPI  82 year old male with PMH CAD s/p CABG, CHF, DM, HTN, PVD presents for evaluation of hypertension. Patient was to have a vascular study today to evaluate for right great toe ulcer. BP elevated to 212/70 when [facility] arrived to transport patient. He was subsequently brought to the emergency department for evaluation. Patient denies HA, vision, changes, SOB, chest pain or other symptoms.   Past Medical History:  Diagnosis Date  . CAD (coronary artery disease) of bypass graft    Multivessel  . Cataract    bilateral  . CHF (congestive heart failure) (HCC)   . Dementia    MMSE 18/30 Feb 2012 [facility](Hospital)  . Diabetes mellitus   . Hearing deficit   . Hyperlipidemia   . Hypertension   . PVD (peripheral vascular disease) [facility](HCC)     Patient Active Problem List   Diagnosis Date Noted  . 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 Iron deficiency anemia 03/29/2016  . 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 Hypomagnesemia 11/10/2015  . Acute renal failure superimposed on stage 3 chronic kidney disease (HCC) 11/10/2015  . Anemia in chronic kidney disease 11/10/2015  . Hyperlipidemia 11/10/2015  . Hypertensive heart disease with CHF (congestive heart failure) (HCC) 11/08/2015  . Chronic systolic CHF (congestive heart failure) (HCC) 11/08/2015  . Vascular dementia without behavioral disturbance 11/08/2015  . Dyslipidemia associated with type 2 diabetes mellitus (HCC) 11/08/2015  . Hypernatremia 11/03/2015  . Bronchospasm   . Bradycardia   . PVD (peripheral vascular disease) (HCC) 01/03/2011  . CAD (coronary artery disease) s/p CABG 01/03/2011    Past Surgical History:  Procedure Laterality Date  . CORONARY ARTERY  BYPASS GRAFT         Home Medications    Prior to Admission medications   Medication Sig Start Date End Date Taking? Authorizing Provider  acetaminophen (TYLENOL) 500 MG tablet Take 500 mg by mouth every 6 (six) hours as needed.    Historical Provider, MD  aspirin EC 81 MG tablet Take 81 mg by mouth daily.    Historical Provider, MD  atorvastatin (LIPITOR) 20 MG tablet Take 20 mg by mouth at bedtime.    Historical Provider, MD  cholecalciferol (VITAMIN D) 1000 UNITS tablet Take 2,000 Units by mouth daily.    Historical Provider, MD  divalproex (DEPAKOTE SPRINKLE) 125 MG capsule Take 250 mg by mouth 3 (three) times daily.     Historical Provider, MD  enalapril (VASOTEC) 20 MG tablet Take 20 mg by mouth daily.    Historical Provider, MD  ferrous sulfate 325 (65 FE) MG tablet Take 325 mg by mouth daily with breakfast.    Historical Provider, MD  guaiFENesin (ROBITUSSIN) 100 MG/5ML liquid Take 200 mg by mouth every 6 (six) hours as needed for cough.    Historical Provider, MD  hydrALAZINE (APRESOLINE) 10 MG tablet Take 10 mg by mouth 3 (three) times daily.    Historical Provider, MD  insulin aspart (NOVOLOG) 100 UNIT/ML injection Inject 10 Units into the skin 3 (three) times daily with meals. Inject as per sliding scale: if  0-59=0 Call MD ; 60-150 = 10 units, 151-449= 15 units. Call MD for CBG < 60 or >  450, subcutaneously before meals related to DM.     Historical Provider, MD  insulin glargine (LANTUS) 100 UNIT/ML injection Inject 8 Units into the skin at bedtime.     Historical Provider, MD  linagliptin (TRADJENTA) 5 MG TABS tablet Take 5 mg by mouth daily.    Historical Provider, MD  Memantine HCl-Donepezil HCl (NAMZARIC) 28-10 MG CP24 Take 1 capsule by mouth at bedtime.    Historical Provider, MD    Family History Family History  Problem Relation Age of Onset  . Diabetes Mother   . Dementia Mother     alzheimer  . Dementia Father   . Stroke Father   . Heart disease Father   .  Dementia Sister     Social History Social History  Substance Use Topics  . Smoking status: Former Smoker    Quit date: 11/19/1948  . Smokeless tobacco: Never Used  . Alcohol use No     Allergies   Patient has no known allergies.   Review of Systems Review of Systems  Constitutional: Negative for chills and fever.  HENT: Negative for ear pain and sore throat.   Eyes: Negative for pain and visual disturbance.  Respiratory: Negative for cough and shortness of breath.   Cardiovascular: Negative for chest pain and palpitations.  Gastrointestinal: Negative for abdominal pain and vomiting.  Genitourinary: Negative for dysuria and hematuria.  Musculoskeletal: Negative for arthralgias and back pain.  Skin: Positive for wound. Negative for color change and rash (chronic ulcer of right great toe).  Neurological: Negative for seizures and syncope.  All other systems reviewed and are negative.    Physical Exam Updated Vital Signs BP (!) 148/50   Pulse 67   Temp 98.1 F (36.7 C) (Oral)   Resp 17   Ht 5\' 9"  (1.753 m)   Wt 81.6 kg   SpO2 99%   BMI 26.58 kg/m   Physical Exam  Constitutional: He is oriented to person, place, and time. He appears well-developed and well-nourished.  HENT:  Head: Normocephalic and atraumatic.  Eyes: Conjunctivae are normal.  Neck: Neck supple.  Cardiovascular: Normal rate, regular rhythm and normal heart sounds.   No murmur heard. Pulmonary/Chest: Effort normal and breath sounds normal. No respiratory distress.  Abdominal: Soft. There is no tenderness.  Musculoskeletal: He exhibits edema (bilateral LE).  There is an ulceration over right great toe with minimal surrounding erythema; area is very TTP  Neurological: He is alert and oriented to person, place, and time.  Skin: Skin is warm and dry.  Psychiatric: He has a normal mood and affect.  Nursing note and vitals reviewed.    ED Treatments / Results  Labs (all labs ordered are listed, but  only abnormal results are displayed) Labs Reviewed - No data to display  EKG  EKG Interpretation None       Radiology No results found.  Procedures Procedures (including critical care time)  Medications Ordered in ED Medications - No data to display   Initial Impression / Assessment and Plan / ED Course  I have reviewed the triage vital signs and the nursing notes.  Pertinent labs & imaging results that were available during my care of the patient were reviewed by me and considered in my medical decision making (see chart for details).  Clinical Course     Patient is an 82 year old male who presents with asymptomatic hypertension. The presentation patient is afebrile, VSS. Blood pressure has improved to 150s/50s without intervention. Denies any  symptoms including chest pain, headache, vision changes, shortness of breath. Patient has chronic LE swelling and a known nonhealing ulcer over the right great toe. He is scheduled to see vascular surgery for this this week. He was on his way to a vascular study when hypertension was noted. Patient is asymptomatic and hypertension has resolved do not think patient needs further lab or imaging workup at this time. He was discharged in stable condition. Will follow up with his primary care doctor as needed for reevaluation of hypertension and will follow-up with vascular surgery as scheduled. Advised to call vascular surgery clinic tomorrow to reschedule imaging. Strict return precautions were discussed and patient and sister are in agreement with plan.  Patient care was supervised by Dr. Adela LankFloyd, ED attending  Final Clinical Impressions(s) / ED Diagnoses   Final diagnoses:  Asymptomatic hypertension    New Prescriptions Discharge Medication List as of 10/30/2016  5:17 PM       Isa RankinAnn B Jaykob Minichiello, MD 10/31/16 [phone]    Melene Planan Floyd, DO 10/31/16 1702

## 2016-10-31 ENCOUNTER — Non-Acute Institutional Stay (SKILLED_NURSING_FACILITY): Payer: Medicare Other | Admitting: Adult Health

## 2016-10-31 ENCOUNTER — Encounter: Payer: Self-pay | Admitting: Adult Health

## 2016-10-31 DIAGNOSIS — E1165 Type 2 diabetes mellitus with hyperglycemia: Secondary | ICD-10-CM | POA: Diagnosis not present

## 2016-10-31 DIAGNOSIS — I110 Hypertensive heart disease with heart failure: Secondary | ICD-10-CM

## 2016-10-31 DIAGNOSIS — E785 Hyperlipidemia, unspecified: Secondary | ICD-10-CM

## 2016-10-31 DIAGNOSIS — E1121 Type 2 diabetes mellitus with diabetic nephropathy: Secondary | ICD-10-CM | POA: Diagnosis not present

## 2016-10-31 DIAGNOSIS — I25709 Atherosclerosis of coronary artery bypass graft(s), unspecified, with unspecified angina pectoris: Secondary | ICD-10-CM

## 2016-10-31 DIAGNOSIS — D631 Anemia in chronic kidney disease: Secondary | ICD-10-CM

## 2016-10-31 DIAGNOSIS — E1169 Type 2 diabetes mellitus with other specified complication: Secondary | ICD-10-CM

## 2016-10-31 DIAGNOSIS — I5022 Chronic systolic (congestive) heart failure: Secondary | ICD-10-CM

## 2016-10-31 DIAGNOSIS — F0150 Vascular dementia without behavioral disturbance: Secondary | ICD-10-CM | POA: Diagnosis not present

## 2016-10-31 DIAGNOSIS — N183 Chronic kidney disease, stage 3 unspecified: Secondary | ICD-10-CM

## 2016-10-31 DIAGNOSIS — IMO0002 Reserved for concepts with insufficient information to code with codable children: Secondary | ICD-10-CM

## 2016-10-31 NOTE — Progress Notes (Signed)
Patient ID: Travis Coleman, male   DOB: 1932/04/01, 82 y.o.   MRN: [id number]   Provider: Synthia Innocent, NP  Location:  [facility] Room Number: 226-A Place of Service:  SNF (31)   PCP: Florentina Jenny, MD Patient Care Team: Florentina Jenny, MD as PCP - General (Family Medicine)  Extended Emergency Contact Information Primary Emergency Contact: Wendelyn Breslow [place] Home Phone: [phone] Mobile Phone: [phone] Relation: Sister  Code Status: Full Code Goals of Care: Advanced Directive information Advanced Directives 10/31/2016  Does Patient Have a Medical Advance Directive? No  Type of Advance Directive -  Does patient want to make changes to medical advance directive? -  Copy of Healthcare Power of Attorney in Chart? -  Would patient like information on creating a medical advance directive? -      No Known Allergies   Chief Complaint  Patient presents with  . Medical Management of Chronic Issues    Follow up    HPI: Patient is a 82 y.o. male seen today for an annual comprehensive examination.  Past Medical History:  Diagnosis Date  . CAD (coronary artery disease) of bypass graft    Multivessel  . Cataract    bilateral  . CHF (congestive heart failure) (HCC)   . Dementia    MMSE 18/30 Feb 2012 [facility])  . Diabetes mellitus   . Hearing deficit   . Hyperlipidemia   . Hypertension   . PVD (peripheral vascular disease) (HCC)    Past Surgical History:  Procedure Laterality Date  . CORONARY ARTERY BYPASS GRAFT      reports that he quit smoking about 67 years ago. He has never used smokeless tobacco. He reports that he does not drink alcohol or use drugs. Social History   Social History  . Marital status: Single    Spouse name: N/A  . Number of children: N/A  . Years of education: N/A   Occupational History  . Not on file.   Social History Main Topics  . Smoking status: Former Smoker    Quit date: 11/19/1948  .  Smokeless tobacco: Never Used  . Alcohol use No  . Drug use: No  . Sexual activity: Not Currently   Other Topics Concern  . Not on file   Social History Narrative  . No narrative on file   Family History  Problem Relation Age of Onset  . Diabetes Mother   . Dementia Mother     alzheimer  . Dementia Father   . Stroke Father   . Heart disease Father   . Dementia Sister     Vitals:   10/31/16 1040  BP: 140/72  Pulse: 77  Resp: 19  Temp: 98.5 F (36.9 C)  TempSrc: Oral  SpO2: 99%  Weight: 186 lb (84.4 kg)  Height: 5\' 8"  (1.727 m)   Body mass index is 28.28 kg/m.    Medication Sig  . aspirin EC 81 MG tablet Take 81 mg by mouth daily.  Marland Kitchen atorvastatin (LIPITOR) 20 MG tablet Take 20 mg by mouth at bedtime.  . cholecalciferol (VITAMIN D) 1000 UNITS tablet Take 2,000 Units by mouth daily.  . divalproex (DEPAKOTE SPRINKLE) 125 MG capsule Take 250 mg by mouth 3 (three) times daily.   . enalapril (VASOTEC) 20 MG tablet Take 20 mg by mouth daily.  . ferrous sulfate 325 (65 FE) MG tablet Take 325 mg by mouth daily with breakfast.  . guaiFENesin (ROBITUSSIN) 100 MG/5ML liquid Take 200  mg by mouth every 6 (six) hours as needed for cough.  . hydrALAZINE (APRESOLINE) 10 MG tablet Take 10 mg by mouth every 8 (eight) hours.   . insulin aspart (NOVOLOG) 100 UNIT/ML injection Inject 10 Units into the skin 3 (three) times daily with meals. Inject as per sliding scale: if  0-59=0 Call MD ; 60-69 = 0 units, 70-150= 10; 151-450 = 13 units; 450 + = 0 unitsCall MD for CBG < 60 or > 450, subcutaneously before meals related to DM.  Marland Kitchen. insulin glargine (LANTUS) 100 UNIT/ML injection Inject 8 Units into the skin at bedtime.   Marland Kitchen. linagliptin (TRADJENTA) 5 MG TABS tablet Take 5 mg by mouth daily.  . Memantine HCl-Donepezil HCl (NAMZARIC) 28-10 MG CP24 Take 1 capsule by mouth at bedtime.   No current facility-administered medications on file prior to visit.       SIGNIFICANT DIAGNOSTIC  EXAMS  11-03-15: chest x-ray; Shallow inspiration with atelectasis in the lung bases. Cardiac enlargement. No evidence of active pulmonary disease.   11-03-15: ct of abdomen and pelvis: 1. No definite acute abnormality seen within the abdomen or pelvis. 2. Trace fluid at the right lower quadrant is nonspecific. 3. Diffuse calcification along the abdominal aorta and its branches, including along the superior mesenteric artery, inferior mesenteric artery and at the proximal renal arteries bilaterally. 4. Mild bibasilar atelectasis noted. 5. Diffuse coronary artery calcifications seen. 6. Mild bilateral renal atrophy noted.  11-04-15: 2-d echo: Left ventricle: The cavity size was normal. There was mild concentric hypertrophy. Systolic function was normal. The estimated ejection fraction was in the range of 50% to 55%. Wall motion was normal; there were no regional wall motion abnormalities. Doppler parameters are consistent with abnormal left ventricular relaxation (grade 1 diastolic dysfunction). - Aortic valve: Valve mobility was restricted. Transvalvular velocity was increased, due to stenosis. There was mild stenosis. - Mitral valve: Calcified annulus. There was mild regurgitation. - Left atrium: The atrium was mildly dilated. - Right atrium: The atrium was mildly dilated.  02-04-16: right lower extremity doppler: negative for dvt     LABS REVIEWED:   11-02-15: wbc 4.7; hgb 13.2; hct 40.8; mcv 98.8; plt 167; glucose 60; bun 49; creat 1.90; k+ 4.2; na++146; liver normal albumin 4.0; lipase 20; tsh 0.614: urine culture: no growth: c-diff: neg  GI pathogen panel: negative 11-04-15: wbc 6.8; hgb 11.3; hct 34.1; mcv 98.3; plt 138; glucose 114; bun 32; creat 1.57; k+ 3.8; na++146; liver normal albumin 2.8 11-05-15: wbc 7.6; hgb 11.0; hct 33.4; mcv 96.5; plt 141; glucose 145; bun 21; creat 1.18; k+ 4.0; na++147; mag 1.5  11-06-15: wbc 8.5; hgb 11.0; hct 33.7; mcv 96.6; plt 149; glucose 161; bun  17; creat 1.05; k+ 3.6; na++151  11-17-15: wbc 6.3; hgb 12.4; hct 39.4; mcv 95.7; plt 185  01-12-16: hgb a1c 9.6; tsh 0.99; chol 145; ldl 74; trig 196; hdl 32  1-61-097-27-17: wbc 5.7; hgb 11.9; hct 34.5; mcv 92.8; plt 112; glucose 158; bun 39; creat 1.52; k+ 5.1; na++ 141; liver normal albumin 3.4; hgb a1c 6.8 urine micro-albumin 12.5   Review of Systems  Unable to perform ROS: dementia    Physical Exam  Constitutional: No distress.  Overweight   Eyes: Conjunctivae are normal.  Neck: Neck supple. No JVD present. No thyromegaly present.  Cardiovascular: Normal rate, regular rhythm and intact distal pulses.   Respiratory: Effort normal and breath sounds normal. No respiratory distress. He has no wheezes.  GI: Soft. Bowel sounds are  normal. He exhibits no distension. There is no tenderness.  Musculoskeletal: 2+ lower extremity edema   Able to move all extremities   Lymphadenopathy:    He has no cervical adenopathy.  Neurological: He is alert.  Skin: Skin is warm and dry. He is not diaphoretic.   Right great toe arterial ulceration: being followed by wound physician  Psychiatric: He has a normal mood and affect.     ASSESSMENT/ PLAN:   1. Dyslipidemia: will continue lipitor 20 mg daily ldl is 74   2. Anemia of chronic disease: hgb is 12.4; will continue iron daily   3. Hypertension: will continue hydralazine 10 mg three times daily   vasotec  20 mg daily Has calcification in bilateral renal arteries   4. CAD: status post cabg: no complaint of chest pain present; will continue asa 81 mg daily   5. Vascular dementia: no change in status; his current weight is 186 pounds; will continue aricept 10 mg nightly will continue namenda xr 28 mg daily  will continue depakote sprinkles 250 mg three times daily to stabilize mood   6. Diabetes: will continue novolog 10 units with meals with an extra 3 units  for cbg >=150 tradjenta 5 mg daily  hgb a1c 9.6   7. Bronchospasm: is stable will continue  albuterol neb every 6 hours as needed and robitussin 200 mg every 6 hours as needed  8. CHF: EF 50-55% (11-04-15) he is presently stable will continue hydralazine 10 mg three times daily is presently not on diuretic will monitor     Time spent with patient  40   minutes >50% time spent counseling; reviewing medical record; tests; labs; and developing future plan of care   MD is aware of resident's narcotic use and is in agreement with current plan of care. We will wean dosage as appropriate for resident.    Synthia Innocenteborah Alford Gamero NP [facility]  Contact [phone] Monday through Friday 8am- 5pm  After hours call [phone]

## 2016-11-02 ENCOUNTER — Encounter: Payer: Medicare Other | Admitting: Surgery

## 2016-11-02 ENCOUNTER — Encounter: Payer: Self-pay | Admitting: Vascular Surgery

## 2016-11-05 ENCOUNTER — Encounter (HOSPITAL_COMMUNITY): Payer: Medicare Other

## 2016-11-05 ENCOUNTER — Encounter: Payer: Medicare Other | Admitting: Vascular Surgery

## 2016-11-09 ENCOUNTER — Encounter: Payer: Self-pay | Admitting: Vascular Surgery

## 2016-11-23 ENCOUNTER — Ambulatory Visit (INDEPENDENT_AMBULATORY_CARE_PROVIDER_SITE_OTHER): Payer: Medicare Other | Admitting: Vascular Surgery

## 2016-11-23 ENCOUNTER — Ambulatory Visit (HOSPITAL_COMMUNITY)
Admission: RE | Admit: 2016-11-23 | Discharge: 2016-11-23 | Disposition: A | Discharge status: Home / Self Care | Payer: Medicare Other | Source: Ambulatory Visit | Attending: Vascular Surgery | Admitting: Vascular Surgery

## 2016-11-23 ENCOUNTER — Other Ambulatory Visit: Payer: Self-pay

## 2016-11-23 ENCOUNTER — Encounter: Payer: Self-pay | Admitting: Vascular Surgery

## 2016-11-23 VITALS — BP 139/64 | HR 56 | Temp 97.3°F | Resp 16

## 2016-11-23 DIAGNOSIS — I739 Peripheral vascular disease, unspecified: Secondary | ICD-10-CM | POA: Insufficient documentation

## 2016-11-23 DIAGNOSIS — R9439 Abnormal result of other cardiovascular function study: Secondary | ICD-10-CM | POA: Diagnosis not present

## 2016-11-23 NOTE — Progress Notes (Signed)
Patient ID: GIANNO VOLNER, male   DOB: 1932/02/18, 83 y.o.   MRN: [id number]  Reason for Consult: New Evaluation (right great toe wound)   Referred by Kirt Boys, DO  Subjective:     HPI:  RAYMONDO GARCIALOPEZ is a 83 y.o. male with information gathered from the chart as he is a poor historian and is not accompanied by anyone today in the office. [place] a [place] and is sent for ulceration of his right great toe. He does not know time frame from this. He states that he does walk although I am unsure of this. He does tell me that he was never a smoker is not sure if his status of diabetes although it is listed as a medical problem of his. He is having significant pain in his foot especially with any amount of pressure. He has aspirin and statin on his medical record and states that he does take his medication as given. No other information is able to be obtained.  Past Medical History:  Diagnosis Date  . CAD (coronary artery disease) of bypass graft    Multivessel  . Cataract    bilateral  . CHF (congestive heart failure) (HCC)   . Dementia    MMSE 18/30 Feb 2012 [facility])  . Diabetes mellitus   . Hearing deficit   . Hyperlipidemia   . Hypertension   . PVD (peripheral vascular disease) (HCC)    Family History  Problem Relation Age of Onset  . Diabetes Mother   . Dementia Mother     alzheimer  . Dementia Father   . Stroke Father   . Heart disease Father   . Dementia Sister    Past Surgical History:  Procedure Laterality Date  . CORONARY ARTERY BYPASS GRAFT      Short Social History:  Social History  Substance Use Topics  . Smoking status: Former Smoker    Quit date: 11/19/1948  . Smokeless tobacco: Never Used  . Alcohol use No    No Known Allergies  Current Outpatient Prescriptions  Medication Sig Dispense Refill  . acetaminophen (TYLENOL) 650 MG CR tablet Take 650 mg by mouth every 6 (six) hours.    Marland Kitchen aspirin EC 81 MG tablet Take 81 mg by mouth daily.     Marland Kitchen atorvastatin (LIPITOR) 20 MG tablet Take 20 mg by mouth at bedtime.    . cholecalciferol (VITAMIN D) 1000 UNITS tablet Take 2,000 Units by mouth daily.    . divalproex (DEPAKOTE SPRINKLE) 125 MG capsule Take 250 mg by mouth 3 (three) times daily.     . enalapril (VASOTEC) 20 MG tablet Take 20 mg by mouth daily.    . ferrous sulfate 325 (65 FE) MG tablet Take 325 mg by mouth daily with breakfast.    . gabapentin (NEURONTIN) 100 MG capsule Take 200 mg by mouth at bedtime.    Marland Kitchen guaiFENesin (ROBITUSSIN) 100 MG/5ML liquid Take 200 mg by mouth every 6 (six) hours as needed for cough.    . hydrALAZINE (APRESOLINE) 10 MG tablet Take 10 mg by mouth 3 (three) times daily.    . insulin aspart (NOVOLOG) 100 UNIT/ML injection Inject 10 Units into the skin 3 (three) times daily with meals. Inject as per sliding scale: if  0-59=0 Call MD ; 60-69 = 0 units, 70-150= 10; 151-450 = 13 units; 450 + = 0 unitsCall MD for CBG < 60 or > 450, subcutaneously before meals related to DM.    Marland Kitchen  insulin glargine (LANTUS) 100 UNIT/ML injection Inject 8 Units into the skin at bedtime.     Marland Kitchen. linagliptin (TRADJENTA) 5 MG TABS tablet Take 5 mg by mouth daily.    . Memantine HCl-Donepezil HCl (NAMZARIC) 28-10 MG CP24 Take 1 capsule by mouth at bedtime.     No current facility-administered medications for this visit.     Review of Systems  Constitutional: Positive for fatigue.  Skin: Positive for wound.        Objective:  Objective   Vitals:   11/23/16 0907  BP: 139/64  Pulse: (!) 56  Resp: 16  Temp: 97.3 F (36.3 C)  SpO2: 99%   There is no height or weight on file to calculate BMI.  Physical Exam  Constitutional: He appears well-developed.  HENT:  Head: Atraumatic.  Eyes: EOM are normal.  Neck: Normal range of motion.  Cardiovascular: Normal rate.   Pulmonary/Chest: Effort normal.  Abdominal: Soft. He exhibits no mass.  Musculoskeletal: He exhibits no edema.  Neurological: He is alert.  Skin:  Right  great toe with ulceration and likely underlying osteo  Psychiatric: He has a normal mood and affect. His behavior is normal. Judgment and thought content normal.    Data: A suboptimal exam for ABIs today he has monophasic waveforms on the right in both the DP and the PT best ABI obtained 0.35 based on the PT. Waveforms the left are also monophasic.     Assessment/Plan:     83 year old white male presents to my office with what appears to be an opened toe infection without surrounding cellulitis. He is very hard of hearing is a poor historian and is demented. He is on aspirin and statin and states that he is eating does not know that he is having fevers. Otherwise I think he merits treatment with Bactrim and Betadine paint to his toe. Pursue angiogram of the right lower extremity at the first opportunity and follow that the next day with either right great toe amputation or more proximal leg amputation based on results of angiogram. We will contact a family member and start him out to have someone present with him for consent and to discuss his state at the time of procedure. He will need to be admitted following his angiogram for procedure the next day. He does not demonstrate any understanding of this and his overall prognosis is poor.     Maeola HarmanBrandon Christopher Finnbar Cedillos MD [facility]

## 2016-11-28 ENCOUNTER — Encounter (HOSPITAL_COMMUNITY)
Admission: RE | Disposition: A | Discharge status: Home / Self Care | Payer: Self-pay | Source: Ambulatory Visit | Attending: Vascular Surgery

## 2016-11-28 ENCOUNTER — Other Ambulatory Visit: Payer: Self-pay

## 2016-11-28 ENCOUNTER — Ambulatory Visit (HOSPITAL_COMMUNITY)
Admission: RE | Admit: 2016-11-28 | Discharge: 2016-11-28 | Disposition: A | Discharge status: Home / Self Care | Payer: Medicare Other | Source: Ambulatory Visit | Attending: Vascular Surgery | Admitting: Vascular Surgery

## 2016-11-28 DIAGNOSIS — L97519 Non-pressure chronic ulcer of other part of right foot with unspecified severity: Secondary | ICD-10-CM | POA: Insufficient documentation

## 2016-11-28 DIAGNOSIS — I70235 Atherosclerosis of native arteries of right leg with ulceration of other part of foot: Secondary | ICD-10-CM | POA: Diagnosis present

## 2016-11-28 HISTORY — PX: PERIPHERAL VASCULAR CATHETERIZATION: SHX172C

## 2016-11-28 LAB — POCT I-STAT, CHEM 8
BUN: 52 mg/dL — ABNORMAL HIGH (ref 6–20)
CHLORIDE: 111 mmol/L (ref 101–111)
CREATININE: 3 mg/dL — AB (ref 0.61–1.24)
Calcium, Ion: 1.15 mmol/L (ref 1.15–1.40)
GLUCOSE: 189 mg/dL — AB (ref 65–99)
HCT: 38 % — ABNORMAL LOW (ref 39.0–52.0)
Hemoglobin: 12.9 g/dL — ABNORMAL LOW (ref 13.0–17.0)
POTASSIUM: 5.4 mmol/L — AB (ref 3.5–5.1)
Sodium: 142 mmol/L (ref 135–145)
TCO2: 22 mmol/L (ref 0–100)

## 2016-11-28 LAB — GLUCOSE, CAPILLARY
Glucose-Capillary: 189 mg/dL — ABNORMAL HIGH (ref 65–99)
Glucose-Capillary: 169 mg/dL — ABNORMAL HIGH (ref 65–99)

## 2016-11-28 SURGERY — ABDOMINAL AORTOGRAM W/LOWER EXTREMITY
Anesthesia: LOCAL

## 2016-11-28 MED ORDER — CEFUROXIME SODIUM 1.5 G IJ SOLR
1.5000 g | INTRAMUSCULAR | Status: DC
  Filled 2016-11-28: qty 1.5

## 2016-11-28 MED ORDER — CHLORHEXIDINE GLUCONATE CLOTH 2 % EX PADS: 6.0000 | MEDICATED_PAD | Freq: Once | CUTANEOUS | Status: DC

## 2016-11-28 MED ORDER — SODIUM CHLORIDE 0.9 % IV SOLN
INTRAVENOUS | Status: DC
  Administered 2016-11-28: 13:00:00 via INTRAVENOUS

## 2016-11-28 MED ORDER — SODIUM CHLORIDE 0.9 % IV SOLN: INTRAVENOUS | Status: DC

## 2016-11-28 MED ORDER — HEPARIN (PORCINE) IN NACL 2-0.9 UNIT/ML-% IJ SOLN
INTRAMUSCULAR | Status: DC | PRN
  Administered 2016-11-28: 1000 mL

## 2016-11-28 MED ORDER — HEPARIN (PORCINE) IN NACL 2-0.9 UNIT/ML-% IJ SOLN
INTRAMUSCULAR | Status: AC
  Filled 2016-11-28: qty 1000

## 2016-11-28 MED ORDER — LIDOCAINE HCL (PF) 1 % IJ SOLN
INTRAMUSCULAR | Status: DC | PRN
  Administered 2016-11-28: 15 mL via INTRADERMAL

## 2016-11-28 MED ORDER — LIDOCAINE HCL (PF) 1 % IJ SOLN
INTRAMUSCULAR | Status: AC
  Filled 2016-11-28: qty 30

## 2016-11-28 MED ORDER — SODIUM CHLORIDE 0.9 % IV SOLN: 1.0000 mL/kg/h | INTRAVENOUS | Status: DC

## 2016-11-28 SURGICAL SUPPLY — 14 items
CATH ANGIO 5F BER2 65CM (CATHETERS) ×2 IMPLANT
CATH OMNI FLUSH 5F 65CM (CATHETERS) ×2 IMPLANT
CATH TEMPO AQUA 5F 100CM (CATHETERS) ×2 IMPLANT
FILTER CO2 0.2 MICRON (VASCULAR PRODUCTS) ×2 IMPLANT
KIT MICROINTRODUCER STIFF 5F (SHEATH) ×2 IMPLANT
KIT PV (KITS) ×2 IMPLANT
RESERVOIR CO2 (VASCULAR PRODUCTS) ×2 IMPLANT
SET FLUSH CO2 (MISCELLANEOUS) ×1 IMPLANT
SHEATH PINNACLE 5F 10CM (SHEATH) ×2 IMPLANT
SYR MEDRAD MARK V 150ML (SYRINGE) ×2 IMPLANT
TRANSDUCER W/STOPCOCK (MISCELLANEOUS) ×2 IMPLANT
TRAY PV CATH (CUSTOM PROCEDURE TRAY) ×2 IMPLANT
WIRE BENTSON .035X145CM (WIRE) ×2 IMPLANT
WIRE HI TORQ VERSACORE J 260CM (WIRE) ×2 IMPLANT

## 2016-11-28 NOTE — H&P (Signed)
     History and Physical Update  The patient was interviewed and re-examined.  The patient's previous History and Physical has been reviewed and is unchanged from previous. Discussed with sister (who is not poa) that patient will require either right 1st toe amputation vs more proximal leg amp.   Bonni Neuser C. Randie Heinzain, MD [facility] [place] Office: [phone] Pager: [phone]   11/28/2016, 1:21 PM

## 2016-11-28 NOTE — Op Note (Signed)
    Patient name: Travis Coleman MRN: [id number] DOB: Jan 10, 1932 Sex: male  11/28/2016 Pre-operative Diagnosis: critical right lower extremity ischemia with toe ulceration Post-operative diagnosis:  Same Surgeon:  Apolinar JunesBrandon C. Randie Heinzain, MD Procedure Performed: 1.  US guided cannulation of left common femoral artery 2.  C02 aortogram  3.  Selective cannulation of Right popliteal artery 4.  Right lower extremity angiogram  Indications:  83 year old white male with right great toe ulceration. He was evaluated in office and considered for amputation and is in need of angiogram.  Findings: Aorta iliac and SFA segments appeared patent. There is disease of the right popliteal artery at the knee and the significant tibial disease as evaluated by CO2 contrast.   Procedure:  The patient was identified in the holding area and taken to room 8.  The patient was then placed supine on the table and prepped and draped in the usual sterile fashion.  A time out was called.  Ultrasound was used to evaluate the left common femoral artery.  It was patent .  A digital ultrasound image was acquired.  A micropuncture needle was used to access the left common femoral artery under ultrasound guidance.  An 018 wire was advanced without resistance and a micropuncture sheath was placed.  The 018 wire was removed and a benson wire was placed.  The micropuncture sheath was exchanged for a 5 french sheath.  An omniflush catheter was advanced over the wire to the level of L-1.  An abdominal angiogram was obtained.  Next, using the omniflush catheter and a benson wire, the aortic bifurcation was crossed and the catheter was placed into theright external iliac artery and right lower extremity angiogram performed. We then used a ber catheter and versacore wire to with the above findings.  Patient will need psychiatric clearance and consideration of right above-the-knee amputation.   Barrie Wale C. Randie Heinzain, MD [facility] Office: [phone] Pager: [phone]

## 2016-11-28 NOTE — Progress Notes (Addendum)
Patient is confused. Patient pulled out IV. Dr. Randie Heinzain made aware. OK to not restart IV per Dr. Randie Heinzain. Patient pulling off electrodes. Left groin level 0

## 2016-11-28 NOTE — Discharge Instructions (Signed)
Angiogram, Care After °These instructions give you information about caring for yourself after your procedure. Your doctor may also give you more specific instructions. Call your doctor if you have any problems or questions after your procedure. °Follow these instructions at home: °· Take medicines only as told by your doctor. °· Follow your doctor's instructions about: °¨ Care of the area where the tube was inserted. °¨ Bandage (dressing) changes and removal. °· You may shower 24-48 hours after the procedure or as told by your doctor. °· Do not take baths, swim, or use a hot tub until your doctor approves. °· Every day, check the area where the tube was inserted. Watch for: °¨ Redness, swelling, or pain. °¨ Fluid, blood, or pus. °· Do not apply powder or lotion to the site. °· Do not lift anything that is heavier than 10 lb (4.5 kg) for 5 days or as told by your doctor. °· Ask your doctor when you can: °¨ Return to work or school. °¨ Do physical activities or play sports. °¨ Have sex. °· Do not drive or operate heavy machinery for 24 hours or as told by your doctor. °· Have someone with you for the first 24 hours after the procedure. °· Keep all follow-up visits as told by your doctor. This is important. °Contact a health care provider if: °· You have a fever. °· You have chills. °· You have more bleeding from the area where the tube was inserted. Hold pressure on the area. °· You have redness, swelling, or pain in the area where the tube was inserted. °· You have fluid or pus coming from the area. °Get help right away if: °· You have a lot of pain in the area where the tube was inserted. °· The area where the tube was inserted is bleeding, and the bleeding does not stop after 30 minutes of holding steady pressure on the area. °· The area near or just beyond the insertion site becomes pale, cool, tingly, or numb. °This information is not intended to replace advice given to you by your health care provider. Make  sure you discuss any questions you have with your health care provider. °Document Released: 02/01/2009 Document Revised: 04/12/2016 Document Reviewed: 04/08/2013 °Elsevier Interactive Patient Education © 2017 [employer] ° °

## 2016-11-29 ENCOUNTER — Encounter (HOSPITAL_COMMUNITY): Payer: Self-pay | Admitting: Vascular Surgery

## 2016-11-29 ENCOUNTER — Inpatient Hospital Stay (HOSPITAL_COMMUNITY): Admission: RE | Admit: 2016-11-29 | Payer: Medicare Other | Source: Ambulatory Visit | Admitting: Vascular Surgery

## 2016-11-29 SURGERY — AMPUTATION DIGIT
Anesthesia: General | Laterality: Right

## 2016-12-14 ENCOUNTER — Other Ambulatory Visit: Payer: Self-pay | Admitting: *Deleted

## 2016-12-25 ENCOUNTER — Non-Acute Institutional Stay (SKILLED_NURSING_FACILITY): Payer: Medicare Other | Admitting: Adult Health

## 2016-12-25 ENCOUNTER — Encounter: Payer: Self-pay | Admitting: Adult Health

## 2016-12-25 DIAGNOSIS — N183 Chronic kidney disease, stage 3 unspecified: Secondary | ICD-10-CM

## 2016-12-25 DIAGNOSIS — E785 Hyperlipidemia, unspecified: Secondary | ICD-10-CM

## 2016-12-25 DIAGNOSIS — D631 Anemia in chronic kidney disease: Secondary | ICD-10-CM | POA: Diagnosis not present

## 2016-12-25 DIAGNOSIS — IMO0002 Reserved for concepts with insufficient information to code with codable children: Secondary | ICD-10-CM

## 2016-12-25 DIAGNOSIS — I209 Angina pectoris, unspecified: Secondary | ICD-10-CM

## 2016-12-25 DIAGNOSIS — Z951 Presence of aortocoronary bypass graft: Secondary | ICD-10-CM

## 2016-12-25 DIAGNOSIS — D509 Iron deficiency anemia, unspecified: Secondary | ICD-10-CM

## 2016-12-25 DIAGNOSIS — E1121 Type 2 diabetes mellitus with diabetic nephropathy: Secondary | ICD-10-CM

## 2016-12-25 DIAGNOSIS — I110 Hypertensive heart disease with heart failure: Secondary | ICD-10-CM | POA: Diagnosis not present

## 2016-12-25 DIAGNOSIS — I25709 Atherosclerosis of coronary artery bypass graft(s), unspecified, with unspecified angina pectoris: Secondary | ICD-10-CM | POA: Diagnosis not present

## 2016-12-25 DIAGNOSIS — E1169 Type 2 diabetes mellitus with other specified complication: Secondary | ICD-10-CM

## 2016-12-25 DIAGNOSIS — I5022 Chronic systolic (congestive) heart failure: Secondary | ICD-10-CM

## 2016-12-25 DIAGNOSIS — E1165 Type 2 diabetes mellitus with hyperglycemia: Secondary | ICD-10-CM

## 2016-12-25 NOTE — Progress Notes (Signed)
Location:   [facility]   Place of Service:  SNF (31)   CODE STATUS: full code  No Known Allergies  Chief Complaint  Patient presents with  . Medical Management of Chronic Issues    HPI:  He is a long term resident of this facility being seen for the management of his chronic illnesses. He is complaining of right lower extremity pain and swelling. He tells me that he cannot straighten it out. The staff reports that this has been a problem for several days. He is unable to fully participate in the hpi or ors.    Past Medical History:  Diagnosis Date  . CAD (coronary artery disease) of bypass graft    Multivessel  . Cataract    bilateral  . CHF (congestive heart failure) (HCC)   . Dementia    MMSE 18/30 Feb 2012 [facility](Hospital)  . Diabetes mellitus   . Hearing deficit   . Hyperlipidemia   . Hypertension   . PVD (peripheral vascular disease) (HCC)     Past Surgical History:  Procedure Laterality Date  . CORONARY ARTERY BYPASS GRAFT    . PERIPHERAL VASCULAR CATHETERIZATION N/A 11/28/2016   Procedure: Abdominal Aortogram w/Lower Extremity;  Surgeon: Maeola HarmanBrandon Christopher Cain, MD;  [facility];  Service: Cardiovascular;  Laterality: N/A;    Social History   Social History  . Marital status: Single    Spouse name: N/A  . Number of children: N/A  . Years of education: N/A   Occupational History  . Not on file.   Social History Main Topics  . Smoking status: Former Smoker    Quit date: 11/19/1948  . Smokeless tobacco: Never Used  . Alcohol use No  . Drug use: No  . Sexual activity: Not Currently   Other Topics Concern  . Not on file   Social History Narrative  . No narrative on file   Family History  Problem Relation Age of Onset  . Diabetes Mother   . Dementia Mother     alzheimer  . Dementia Father   . Stroke Father   . Heart disease Father   . Dementia Sister       VITAL SIGNS BP (!) 180/60   Pulse 88   Temp 98 F (36.7 C)    Resp 18   Ht 5\' 8"  (1.727 m)   Wt 187 lb 3.2 oz (84.9 kg)   SpO2 99%   BMI 28.46 kg/m   Patient's Medications  New Prescriptions   No medications on file  Previous Medications   ACETAMINOPHEN (TYLENOL) 650 MG CR TABLET    Take 650 mg by mouth every 6 (six) hours.   AMINO ACIDS-PROTEIN HYDROLYS (FEEDING SUPPLEMENT, PRO-STAT SUGAR FREE 64,) LIQD    Take 90 mLs by mouth 3 (three) times daily.   ASPIRIN EC 81 MG TABLET    Take 81 mg by mouth daily.   ATORVASTATIN (LIPITOR) 20 MG TABLET    Take 20 mg by mouth at bedtime.   CHOLECALCIFEROL (VITAMIN D) 1000 UNITS TABLET    Take 2,000 Units by mouth daily.   DIVALPROEX (DEPAKOTE SPRINKLE) 125 MG CAPSULE    Take 250 mg by mouth 3 (three) times daily.    ENALAPRIL (VASOTEC) 20 MG TABLET    Take 20 mg by mouth daily.   EYELID CLEANSERS (OCUSOFT EYELID CLEANSING) PADS    Apply 1 application topically daily.   FERROUS SULFATE 325 (65 FE) MG TABLET    Take  325 mg by mouth daily with breakfast.   GABAPENTIN (NEURONTIN) 100 MG CAPSULE    Take 200 mg by mouth at bedtime.   GUAIFENESIN (ROBITUSSIN) 100 MG/5ML LIQUID    Take 200 mg by mouth every 6 (six) hours as needed for cough.   HYDRALAZINE (APRESOLINE) 10 MG TABLET    Take 10 mg by mouth every 8 (eight) hours.    INSULIN ASPART (NOVOLOG) 100 UNIT/ML INJECTION    Inject 10 Units into the skin 3 (three) times daily with meals. Inject as per sliding scale: if  0-59=0 Call MD ; 60-69 = 0 units, 70-150= 10; 151-450 = 13 units; 450 + = 0 unitsCall MD for CBG < 60 or > 450, subcutaneously before meals related to DM.   INSULIN GLARGINE (LANTUS) 100 UNIT/ML INJECTION    Inject 8 Units into the skin at bedtime.    LINAGLIPTIN (TRADJENTA) 5 MG TABS TABLET    Take 5 mg by mouth daily.   MEMANTINE HCL-DONEPEZIL HCL (NAMZARIC) 28-10 MG CP24    Take 1 capsule by mouth at bedtime.   PROMETHAZINE (PHENERGAN) 25 MG TABLET    Take 12.5 mg by mouth every 6 (six) hours as needed for nausea or vomiting.   SKIN PROTECTANTS,  MISC. (CALAZIME SKIN PROTECTANT) PSTE    Apply 1 application topically 2 (two) times daily. To buttocks for incontinence care  Modified Medications   No medications on file  Discontinued Medications   DOXYCYCLINE (VIBRA-TABS) 100 MG TABLET    Take 100 mg by mouth 2 (two) times daily.     SIGNIFICANT DIAGNOSTIC EXAMS   11-03-15: chest x-ray; Shallow inspiration with atelectasis in the lung bases. Cardiac enlargement. No evidence of active pulmonary disease.   11-03-15: ct of abdomen and pelvis: 1. No definite acute abnormality seen within the abdomen or pelvis. 2. Trace fluid at the right lower quadrant is nonspecific. 3. Diffuse calcification along the abdominal aorta and its branches, including along the superior mesenteric artery, inferior mesenteric artery and at the proximal renal arteries bilaterally. 4. Mild bibasilar atelectasis noted. 5. Diffuse coronary artery calcifications seen. 6. Mild bilateral renal atrophy noted.  11-04-15: 2-d echo: Left ventricle: The cavity size was normal. There was mild concentric hypertrophy. Systolic function was normal. The estimated ejection fraction was in the range of 50% to 55%. Wall motion was normal; there were no regional wall motion abnormalities. Doppler parameters are consistent with abnormal left ventricular relaxation (grade 1 diastolic dysfunction). - Aortic valve: Valve mobility was restricted. Transvalvular velocity was increased, due to stenosis. There was mild stenosis. - Mitral valve: Calcified annulus. There was mild regurgitation. - Left atrium: The atrium was mildly dilated. - Right atrium: The atrium was mildly dilated.  02-04-16: right lower extremity doppler: negative for dvt     LABS REVIEWED:   01-12-16: hgb a1c 9.6; tsh 0.99; chol 145; ldl 74; trig 196; hdl 32  02-26-80: wbc 5.7; hgb 11.9; hct 34.5; mcv 92.8; plt 112; glucose 158; bun 39; creat 1.52; k+ 5.1; na++ 141; liver normal albumin 3.4; hgb a1c 6.8 urine  micro-albumin 12.5  07-21-16: wbc 7.6; hgb 10.9; hct 34.4; mcv 92.6; plt 183; glucose 255; bun 38.6; creat 1.41; k+ 4.5; na++ 139 chol 126; ldl 49; trig 262; hdl 25 depakote 16  08-29-16: wbc 5.1; hgb 10.7; hct 34.2; mcv 92.6; plt 118  glucose 151; bun 51.6; creat 1.28; k+ 4.6; na++ 143  09-26-16: wbc 6.2; hgb 11.2; hct 36.6; mcv 99.0; plt 110; glucose 155;  bun 29.0; creat 1.25; k+ 5.0; na++ 144; liver normal albumin 3.5 hgb a1c 7.8   Review of Systems  Unable to perform ROS: dementia    Physical Exam  Constitutional: No distress.  Overweight   Eyes: Conjunctivae are normal.  Neck: Neck supple. No JVD present. No thyromegaly present.  Cardiovascular: Normal rate, regular rhythm and intact distal pulses.   Respiratory: Effort normal and breath sounds normal. No respiratory distress. He has no wheezes.  GI: Soft. Bowel sounds are normal. He exhibits no distension. There is no tenderness.  Musculoskeletal: lower extremity edema right worse than left Right leg is bent a knee; states hurts too bad to move    Able to move all extremities   Lymphadenopathy:    He has no cervical adenopathy.  Neurological: He is alert.  Skin: Skin is warm and dry. He is not diaphoretic.   Right great toe arterial ulceration: 2.8 x 2.2 x 0.12 cm  being followed by wound physician  Psychiatric: He has a normal mood and affect.     ASSESSMENT/ PLAN:   1. Dyslipidemia: will continue lipitor 20 mg daily ldl is 49   2. Anemia of chronic disease: hgb is 11.2; will continue iron daily   3. Hypertension: will continue vasotec  20 mg daily  Will increase hydralazine to 25 mg three times daily Has calcification in bilateral renal arteries   4. CAD: status post cabg: no complaint of chest pain present; will continue asa 81 mg daily   5. Vascular dementia: no change in status; his current weight is 187 pounds; will continue aricept 10 mg nightly will continue namenda xr 28 mg daily  will continue depakote sprinkles  250 mg three times daily to stabilize mood   6. Diabetes: will continue novolog 10 units with meals with an extra 3 units  for cbg >=150 tradjenta 5 mg daily and lantus 8 units nightly  hgb a1c 7.8   7. Bronchospasm: is stable will continue albuterol neb every 6 hours as needed and robitussin 200 mg every 6 hours as needed  8. CHF: EF 50-55% (11-04-15) he is presently stable will  is presently not on diuretic will increase hydralazine to 25 mg three times daily   Will get a venous doppler due to right lower extremity edema and pain     MD is aware of resident's narcotic use and is in agreement with current plan of care. We will attempt to wean resident as apropriate   Synthia Innocent NP [facility]  Contact [phone] Monday through Friday 8am- 5pm  After hours call [phone]

## 2017-01-23 ENCOUNTER — Encounter: Payer: Self-pay | Admitting: Adult Health

## 2017-01-23 ENCOUNTER — Non-Acute Institutional Stay (SKILLED_NURSING_FACILITY): Payer: Medicare Other | Admitting: Adult Health

## 2017-01-23 DIAGNOSIS — E1165 Type 2 diabetes mellitus with hyperglycemia: Secondary | ICD-10-CM | POA: Diagnosis not present

## 2017-01-23 DIAGNOSIS — I25709 Atherosclerosis of coronary artery bypass graft(s), unspecified, with unspecified angina pectoris: Secondary | ICD-10-CM

## 2017-01-23 DIAGNOSIS — IMO0002 Reserved for concepts with insufficient information to code with codable children: Secondary | ICD-10-CM

## 2017-01-23 DIAGNOSIS — I209 Angina pectoris, unspecified: Secondary | ICD-10-CM | POA: Diagnosis not present

## 2017-01-23 DIAGNOSIS — I739 Peripheral vascular disease, unspecified: Secondary | ICD-10-CM

## 2017-01-23 DIAGNOSIS — N183 Chronic kidney disease, stage 3 unspecified: Secondary | ICD-10-CM | POA: Insufficient documentation

## 2017-01-23 DIAGNOSIS — I5022 Chronic systolic (congestive) heart failure: Secondary | ICD-10-CM

## 2017-01-23 DIAGNOSIS — E1169 Type 2 diabetes mellitus with other specified complication: Secondary | ICD-10-CM | POA: Diagnosis not present

## 2017-01-23 DIAGNOSIS — E1121 Type 2 diabetes mellitus with diabetic nephropathy: Secondary | ICD-10-CM | POA: Diagnosis not present

## 2017-01-23 DIAGNOSIS — E785 Hyperlipidemia, unspecified: Secondary | ICD-10-CM

## 2017-01-23 DIAGNOSIS — D631 Anemia in chronic kidney disease: Secondary | ICD-10-CM

## 2017-01-23 NOTE — Progress Notes (Signed)
Location:   [facility] Room Number: 226 A Place of Service:  SNF (31)   CODE STATUS:  Full Code  No Known Allergies  Chief Complaint  Patient presents with  . Medical Management of Chronic Issues    Routine Visit    HPI:    Past Medical History:  Diagnosis Date  . CAD (coronary artery disease) of bypass graft    Multivessel  . Cataract    bilateral  . CHF (congestive heart failure) (HCC)   . Dementia    MMSE 18/30 Feb 2012 [facility])  . Diabetes mellitus   . Hearing deficit   . Hyperlipidemia   . Hypertension   . PVD (peripheral vascular disease) (HCC)     Past Surgical History:  Procedure Laterality Date  . CORONARY ARTERY BYPASS GRAFT    . PERIPHERAL VASCULAR CATHETERIZATION N/A 11/28/2016   Procedure: Abdominal Aortogram w/Lower Extremity;  Surgeon: Maeola Harman, MD;  Location: [facility] INVASIVE CV LAB;  Service: Cardiovascular;  Laterality: N/A;    Social History   Social History  . Marital status: Single    Spouse name: N/A  . Number of children: N/A  . Years of education: N/A   Occupational History  . Not on file.   Social History Main Topics  . Smoking status: Former Smoker    Quit date: 11/19/1948  . Smokeless tobacco: Never Used  . Alcohol use No  . Drug use: No  . Sexual activity: Not Currently   Other Topics Concern  . Not on file   Social History Narrative  . No narrative on file   Family History  Problem Relation Age of Onset  . Diabetes Mother   . Dementia Mother     alzheimer  . Dementia Father   . Stroke Father   . Heart disease Father   . Dementia Sister       VITAL SIGNS BP (!) 150/68   Pulse 64   Temp (!) 94.6 F (34.8 C)   Ht 5\' 8"  (1.727 m)   Wt 187 lb 3 oz (84.9 kg)   BMI 28.46 kg/m   Patient's Medications  New Prescriptions   No medications on file  Previous Medications   ACETAMINOPHEN (TYLENOL) 650 MG CR TABLET    Take 650 mg by mouth every 6 (six) hours.   AMINO ACIDS-PROTEIN  HYDROLYS (FEEDING SUPPLEMENT, PRO-STAT SUGAR FREE 64,) LIQD    Take 90 mLs by mouth 3 (three) times daily.   ASPIRIN EC 81 MG TABLET    Take 81 mg by mouth daily.   ATORVASTATIN (LIPITOR) 20 MG TABLET    Take 20 mg by mouth at bedtime.   CHOLECALCIFEROL (VITAMIN D) 1000 UNITS TABLET    Take 2,000 Units by mouth daily.   DIVALPROEX (DEPAKOTE SPRINKLE) 125 MG CAPSULE    Take 250 mg by mouth 3 (three) times daily.    ENALAPRIL (VASOTEC) 20 MG TABLET    Take 20 mg by mouth daily.   EYELID CLEANSERS (OCUSOFT EYELID CLEANSING) PADS    Apply 1 application topically daily.   FERROUS SULFATE 325 (65 FE) MG TABLET    Take 325 mg by mouth daily with breakfast.   GABAPENTIN (NEURONTIN) 100 MG CAPSULE    Take 200 mg by mouth at bedtime.   GUAIFENESIN (ROBITUSSIN) 100 MG/5ML LIQUID    Take 200 mg by mouth every 6 (six) hours as needed for cough.   HYDRALAZINE (APRESOLINE) 25 MG TABLET    Take 25  mg by mouth every 8 (eight) hours.   INSULIN ASPART (NOVOLOG) 100 UNIT/ML INJECTION    Inject 10 Units into the skin 3 (three) times daily with meals. Inject as per sliding scale: if  0-59=0 Call MD ; 60-69 = 0 units, 70-150= 10; 151-450 = 13 units; 450 + = 0 unitsCall MD for CBG < 60 or > 450, subcutaneously before meals related to DM.   INSULIN GLARGINE (LANTUS) 100 UNIT/ML INJECTION    Inject 8 Units into the skin at bedtime.    LINAGLIPTIN (TRADJENTA) 5 MG TABS TABLET    Take 5 mg by mouth daily.   namzaric 28-10 mg  Take daily    PROMETHAZINE (PHENERGAN) 25 MG TABLET    Take 12.5 mg by mouth every 6 (six) hours as needed for nausea or vomiting.   SKIN PROTECTANTS, MISC. (CALAZIME SKIN PROTECTANT) PSTE    Apply 1 application topically 2 (two) times daily. To buttocks for incontinence care  Modified Medications   No medications on file  Discontinued Medications   HYDRALAZINE (APRESOLINE) 10 MG TABLET    Take 10 mg by mouth every 8 (eight) hours.    MEMANTINE HCL-DONEPEZIL HCL (NAMZARIC) 28-10 MG CP24    Take 1  capsule by mouth at bedtime.     SIGNIFICANT DIAGNOSTIC EXAMS  11-03-15: chest x-ray; Shallow inspiration with atelectasis in the lung bases. Cardiac enlargement. No evidence of active pulmonary disease.   11-03-15: ct of abdomen and pelvis: 1. No definite acute abnormality seen within the abdomen or pelvis. 2. Trace fluid at the right lower quadrant is nonspecific. 3. Diffuse calcification along the abdominal aorta and its branches, including along the superior mesenteric artery, inferior mesenteric artery and at the proximal renal arteries bilaterally. 4. Mild bibasilar atelectasis noted. 5. Diffuse coronary artery calcifications seen. 6. Mild bilateral renal atrophy noted.  11-04-15: 2-d echo: Left ventricle: The cavity size was normal. There was mild concentric hypertrophy. Systolic function was normal. The estimated ejection fraction was in the range of 50% to 55%. Wall motion was normal; there were no regional wall motion abnormalities. Doppler parameters are consistent with abnormal left ventricular relaxation (grade 1 diastolic dysfunction). - Aortic valve: Valve mobility was restricted. Transvalvular velocity was increased, due to stenosis. There was mild stenosis. - Mitral valve: Calcified annulus. There was mild regurgitation. - Left atrium: The atrium was mildly dilated. - Right atrium: The atrium was mildly dilated.  02-04-16: right lower extremity doppler: negative for dvt     LABS REVIEWED:   06-14-16: wbc 5.7; hgb 11.9; hct 34.5; mcv 92.8; plt 112; glucose 158; bun 39; creat 1.52; k+ 5.1; na++ 141; liver normal albumin 3.4; hgb a1c 6.8 urine micro-albumin 12.5 (on ace)  07-21-16: wbc 7.6; hgb 10.9; hct 34.4; mcv 92.6; plt 183; glucose 255; bun 38.6; creat 1.41; k+ 4.5; na++ 139 chol 126; ldl 49; trig 262; hdl 25 depakote 16  08-29-16: wbc 5.1; hgb 10.7; hct 34.2; mcv 92.6; plt 118  glucose 151; bun 51.6; creat 1.28; k+ 4.6; na++ 143  09-26-16: wbc 6.2; hgb 11.2; hct 36.6;  mcv 99.0; plt 110; glucose 155; bun 29.0; creat 1.25; k+ 5.0; na++ 144; liver normal albumin 3.5 hgb a1c 7.8      Review of Systems  Unable to perform ROS: dementia    Physical Exam  Constitutional: No distress.  Overweight   Eyes: Conjunctivae are normal.  Neck: Neck supple. No JVD present. No thyromegaly present.  Cardiovascular: Normal rate, regular rhythm and intact  distal pulses.   Respiratory: Effort normal and breath sounds normal. No respiratory distress. He has no wheezes.  GI: Soft. Bowel sounds are normal. He exhibits no distension. There is no tenderness.  Musculoskeletal: trace bilateral lower extremity edema    Able to move all extremities   Lymphadenopathy:    He has no cervical adenopathy.  Neurological: He is alert.  Skin: Skin is warm and dry. He is not diaphoretic.   Right lateral heel; stage III: 1.7 x 1.0 x 0.1 cm silver alginate  Psychiatric: He has a normal mood and affect.     ASSESSMENT/ PLAN:  1. Dyslipidemia: will continue lipitor 20 mg daily ldl is 49   2. Anemia of chronic disease: hgb is 11.2; will continue iron daily   3. Hypertension: Has calcification in bilateral renal arteries  will continue vasotec  20 mg daily  Will increase hydralazine to 50 mg every 8 hours.   4. CAD: status post cabg: no complaint of chest pain present; will continue asa 81 mg daily   5. Vascular dementia: no change in status; his current weight is 187 pounds; will continue namzaric 28-10 mg daily   will continue depakote sprinkles 250 mg three times daily to stabilize mood   6. Diabetes: hgb a1c 7.8  will continue novolog 10 units with meals with an extra 3 units  for cbg >=150 tradjenta 5 mg daily and lantus 8 units nightly   7. CHF: EF 50-55% (11-04-15) he is presently stable will  is presently not on diuretic will increase hydralazine to 25 mg three times daily   8. PVD: will continue asa 81 mg daily; tylenol 650 mg every 6 hours for pain management   9. CDK  stage III: bun 51.6; creat 1.28; will monitor   Will check hgb a1c lipids   MD is aware of resident's narcotic use and is in agreement with current plan of care. We will attempt to wean resident as apropriate     Synthia Innocenteborah Green NP [facility]  Contact [phone] Monday through Friday 8am- 5pm  After hours call [phone]

## 2017-01-25 LAB — LIPID PANEL
Cholesterol: 145 mg/dL (ref 0–200)
HDL: 27 mg/dL — AB (ref 35–70)
LDL CALC: 92 mg/dL
Triglycerides: 199 mg/dL — AB (ref 40–160)

## 2017-01-25 LAB — HEMOGLOBIN A1C: Hemoglobin A1C: 7.3

## 2017-02-01 ENCOUNTER — Other Ambulatory Visit: Payer: Self-pay

## 2017-02-05 ENCOUNTER — Encounter (HOSPITAL_COMMUNITY): Payer: Self-pay | Admitting: *Deleted

## 2017-02-05 ENCOUNTER — Other Ambulatory Visit: Payer: Self-pay

## 2017-02-05 NOTE — Pre-Procedure Instructions (Addendum)
    Travis Coleman  02/05/2017     [name]'s procedure is scheduled on Thursday, February 07, 2017.   [facility] Ad[facility] Office at 9:00 AM.   Call this number if you have problems the morning of surgery: [phone]   Remember:  Patient is not to eat food or drink liquids after midnight Wednesday, 02/05/17.  Take these medicines the morning of surgery with A SIP OF WATER: Aspirin, Depakote, Hydralazine, Memantine-Doneprezil (Namzaric).   Check patient's blood sugar the morning of surgery when he wakes up and every 2 hours until he leaves for the hospital.   Treat a low blood sugar (less than 70 mg/dL) with 1/2 cup of clear juice (cranberry or apple), 4 glucose tablets, OR glucose gel.  Recheck blood sugar in 15 minutes after treatment (to make sure it is greater than 70 mg/dL).  If blood sugar is not greater than 70 mg/dL on re-check, call [phone] for further instructions.   Do not give oral diabetes medicines (pills) the morning of surgery.       THE NIGHT BEFORE SURGERY, take 4 units of Lantus Insulin.  If patient's CBG is greater than 220 mg/dL, give pt 1/2 of his sliding scale (correction) dose of insulin.   Do not wear jewelry.  Do not wear lotions, powders or cologne.  Men may shave face and neck.  Do not bring valuables to the hospital.  [facility] is not responsible for any belongings or valuables.  Contacts, dentures or bridgework may not be worn into surgery.  Leave your suitcase in the car.  After surgery it may be brought to your room.  For patients admitted to the hospital, discharge time will be determined by your treatment team.  Any questions, please call me, Hyman Bibleeresa Daquawn Seelman, RN at [phone].

## 2017-02-05 NOTE — Progress Notes (Signed)
Spoke with Essence, LPN for pre-op call. Pt is a resident at [facility]. Hx of CABG years ago. Essence reports that pt has not c/o chest pain or sob. Pt is diabetic, type 2. Last A1C was 7.3 on 01/25/17. Fasting blood sugar at SNF is usually between 80-150 per Essence. Pt has dementia without violent behavior, his sister is his guardian. She will be here day of surgery. Faxed pre-op instructions to Weyman CroonStan, Charity fundraiserN at [facility]. Instructed them to give pt 1/2 of his regular dose of Lantus Wednesday PM.  Instructed them to have pt's blood sugar checked when he wakes up Thursday AM and every 2 hours until he leaves for the hospital. If blood sugar is >220 take 1/2 of usual correction dose of Novolog insulin. If blood sugar is 70 or below, treat with 1/2 cup of clear juice (apple or cranberry) and recheck blood sugar 15 minutes after drinking juice. If blood sugar continues to be 70 or below, call the Short Stay department and ask to speak to a nurse.

## 2017-02-07 ENCOUNTER — Inpatient Hospital Stay (HOSPITAL_COMMUNITY): Payer: Medicare Other | Admitting: Anesthesiology

## 2017-02-07 ENCOUNTER — Encounter (HOSPITAL_COMMUNITY): Payer: Self-pay | Admitting: Urology

## 2017-02-07 ENCOUNTER — Encounter (HOSPITAL_COMMUNITY)
Admission: RE | Disposition: A | Discharge status: Skilled Nursing Facility | Payer: Self-pay | Source: Ambulatory Visit | Attending: Vascular Surgery

## 2017-02-07 ENCOUNTER — Ambulatory Visit (HOSPITAL_COMMUNITY)
Admission: RE | Admit: 2017-02-07 | Discharge: 2017-02-07 | Disposition: A | Discharge status: Skilled Nursing Facility | Payer: Medicare Other | Source: Ambulatory Visit | Attending: Vascular Surgery | Admitting: Vascular Surgery

## 2017-02-07 DIAGNOSIS — I509 Heart failure, unspecified: Secondary | ICD-10-CM | POA: Diagnosis not present

## 2017-02-07 DIAGNOSIS — N189 Chronic kidney disease, unspecified: Secondary | ICD-10-CM | POA: Insufficient documentation

## 2017-02-07 DIAGNOSIS — I2510 Atherosclerotic heart disease of native coronary artery without angina pectoris: Secondary | ICD-10-CM | POA: Insufficient documentation

## 2017-02-07 DIAGNOSIS — Z87891 Personal history of nicotine dependence: Secondary | ICD-10-CM | POA: Diagnosis not present

## 2017-02-07 DIAGNOSIS — E1151 Type 2 diabetes mellitus with diabetic peripheral angiopathy without gangrene: Secondary | ICD-10-CM | POA: Diagnosis not present

## 2017-02-07 DIAGNOSIS — Z951 Presence of aortocoronary bypass graft: Secondary | ICD-10-CM | POA: Diagnosis not present

## 2017-02-07 DIAGNOSIS — L97519 Non-pressure chronic ulcer of other part of right foot with unspecified severity: Secondary | ICD-10-CM | POA: Insufficient documentation

## 2017-02-07 DIAGNOSIS — I130 Hypertensive heart and chronic kidney disease with heart failure and stage 1 through stage 4 chronic kidney disease, or unspecified chronic kidney disease: Secondary | ICD-10-CM | POA: Insufficient documentation

## 2017-02-07 DIAGNOSIS — I739 Peripheral vascular disease, unspecified: Secondary | ICD-10-CM | POA: Diagnosis present

## 2017-02-07 DIAGNOSIS — D649 Anemia, unspecified: Secondary | ICD-10-CM | POA: Diagnosis not present

## 2017-02-07 DIAGNOSIS — E11621 Type 2 diabetes mellitus with foot ulcer: Secondary | ICD-10-CM | POA: Diagnosis not present

## 2017-02-07 DIAGNOSIS — E1122 Type 2 diabetes mellitus with diabetic chronic kidney disease: Secondary | ICD-10-CM | POA: Insufficient documentation

## 2017-02-07 DIAGNOSIS — Z538 Procedure and treatment not carried out for other reasons: Secondary | ICD-10-CM | POA: Diagnosis not present

## 2017-02-07 HISTORY — DX: Chronic kidney disease, unspecified: N18.9

## 2017-02-07 HISTORY — DX: Anemia, unspecified: D64.9

## 2017-02-07 HISTORY — DX: Dysphagia, unspecified: R13.10

## 2017-02-07 HISTORY — DX: Thrombocytopenia, unspecified: D69.6

## 2017-02-07 HISTORY — DX: Bradycardia, unspecified: R00.1

## 2017-02-07 HISTORY — DX: Polyneuropathy, unspecified: G62.9

## 2017-02-07 LAB — BASIC METABOLIC PANEL
ANION GAP: 10 (ref 5–15)
BUN: 30 mg/dL — ABNORMAL HIGH (ref 6–20)
CALCIUM: 8.6 mg/dL — AB (ref 8.9–10.3)
CO2: 20 mmol/L — ABNORMAL LOW (ref 22–32)
Chloride: 108 mmol/L (ref 101–111)
Creatinine, Ser: 1.33 mg/dL — ABNORMAL HIGH (ref 0.61–1.24)
GFR, EST AFRICAN AMERICAN: 56 mL/min — AB (ref >60)
GFR, EST NON AFRICAN AMERICAN: 48 mL/min — AB (ref >60)
Glucose, Bld: 190 mg/dL — ABNORMAL HIGH (ref 65–99)
Potassium: 4.5 mmol/L (ref 3.5–5.1)
Sodium: 138 mmol/L (ref 135–145)

## 2017-02-07 LAB — CBC
HEMATOCRIT: 37.7 % — AB (ref 39.0–52.0)
HEMOGLOBIN: 11.9 g/dL — AB (ref 13.0–17.0)
MCH: 30.8 pg (ref 26.0–34.0)
MCHC: 31.6 g/dL (ref 30.0–36.0)
MCV: 97.7 fL (ref 78.0–100.0)
PLATELETS: 181 10*3/uL (ref 150–400)
RBC: 3.86 MIL/uL — AB (ref 4.22–5.81)
RDW: 14 % (ref 11.5–15.5)
WBC: 9.1 10*3/uL (ref 4.0–10.5)

## 2017-02-07 LAB — GLUCOSE, CAPILLARY
Glucose-Capillary: 190 mg/dL — ABNORMAL HIGH (ref 65–99)
Glucose-Capillary: 169 mg/dL — ABNORMAL HIGH (ref 65–99)

## 2017-02-07 SURGERY — AMPUTATION, ABOVE KNEE
Anesthesia: General | Site: Leg Upper | Laterality: Right

## 2017-02-07 MED ORDER — SODIUM CHLORIDE 0.9 % IV SOLN: INTRAVENOUS | Status: DC

## 2017-02-07 MED ORDER — ONDANSETRON HCL 4 MG/2ML IJ SOLN
INTRAMUSCULAR | Status: AC
  Filled 2017-02-07: qty 2

## 2017-02-07 MED ORDER — PROPOFOL 10 MG/ML IV BOLUS
INTRAVENOUS | Status: AC
  Filled 2017-02-07: qty 20

## 2017-02-07 MED ORDER — ROCURONIUM BROMIDE 50 MG/5ML IV SOSY
PREFILLED_SYRINGE | INTRAVENOUS | Status: AC
  Filled 2017-02-07: qty 5

## 2017-02-07 MED ORDER — SODIUM CHLORIDE 0.9 % IV SOLN
INTRAVENOUS | Status: DC
  Administered 2017-02-07: 09:00:00 via INTRAVENOUS

## 2017-02-07 MED ORDER — CHLORHEXIDINE GLUCONATE CLOTH 2 % EX PADS: 6.0000 | MEDICATED_PAD | Freq: Once | CUTANEOUS | Status: DC

## 2017-02-07 MED ORDER — FENTANYL CITRATE (PF) 250 MCG/5ML IJ SOLN
INTRAMUSCULAR | Status: AC
  Filled 2017-02-07: qty 5

## 2017-02-07 MED ORDER — DEXTROSE 5 % IV SOLN
1.5000 g | INTRAVENOUS | Status: DC
  Filled 2017-02-07: qty 1.5

## 2017-02-07 MED ORDER — LIDOCAINE 2% (20 MG/ML) 5 ML SYRINGE
INTRAMUSCULAR | Status: AC
  Filled 2017-02-07: qty 5

## 2017-02-07 NOTE — Progress Notes (Addendum)
Surgery cancelled per patient and family. IV removed. [facility] via [facility] and [facility] transportation. Nurse at [facility] notified.

## 2017-02-07 NOTE — Progress Notes (Signed)
   Patient presented for surgery but does not want surgery and sisters are reluctant to sign his consent. Will cancel today. Family can reschedule if patient is agreeable in the future.   Travis LivingsBrandon Fard Coleman

## 2017-02-07 NOTE — Anesthesia Preprocedure Evaluation (Deleted)
Anesthesia Evaluation  Patient identified by MRN, date of birth, ID band Patient awake    Reviewed: Allergy & Precautions, NPO status , Patient's Chart, lab work & pertinent test results  Airway Mallampati: II  TM Distance: >3 FB Neck ROM: Full    Dental  (+) Dental Advisory Given   Pulmonary former smoker,    breath sounds clear to auscultation       Cardiovascular hypertension, Pt. on medications + CAD, + CABG, + Peripheral Vascular Disease and +CHF   Rhythm:Regular Rate:Normal     Neuro/Psych PSYCHIATRIC DISORDERS negative neurological ROS     GI/Hepatic negative GI ROS, Neg liver ROS,   Endo/Other  diabetes, Type 2, Insulin Dependent  Renal/GU CRFRenal disease     Musculoskeletal   Abdominal   Peds  Hematology  (+) anemia ,   Anesthesia Other Findings   Reproductive/Obstetrics                            Lab Results  Component Value Date   WBC 9.1 02/07/2017   HGB 11.9 (L) 02/07/2017   HCT 37.7 (L) 02/07/2017   MCV 97.7 02/07/2017   PLT 181 02/07/2017   Lab Results  Component Value Date   CREATININE 1.33 (H) 02/07/2017   BUN 30 (H) 02/07/2017   NA 138 02/07/2017   K 4.5 02/07/2017   CL 108 02/07/2017   CO2 20 (L) 02/07/2017    Anesthesia Physical Anesthesia Plan  ASA: III  Anesthesia Plan: General   Post-op Pain Management:    Induction: Intravenous  Airway Management Planned: Oral ETT  Additional Equipment:   Intra-op Plan:   Post-operative Plan: Extubation in OR  Informed Consent: I have reviewed the patients History and Physical, chart, labs and discussed the procedure including the risks, benefits and alternatives for the proposed anesthesia with the patient or authorized representative who has indicated his/her understanding and acceptance.   Dental advisory given  Plan Discussed with: CRNA  Anesthesia Plan Comments:        Anesthesia Quick  Evaluation

## 2017-02-20 ENCOUNTER — Non-Acute Institutional Stay (SKILLED_NURSING_FACILITY): Payer: Medicare Other | Admitting: Adult Health

## 2017-02-20 ENCOUNTER — Encounter: Payer: Self-pay | Admitting: Adult Health

## 2017-02-20 DIAGNOSIS — N183 Chronic kidney disease, stage 3 unspecified: Secondary | ICD-10-CM

## 2017-02-20 DIAGNOSIS — E1165 Type 2 diabetes mellitus with hyperglycemia: Secondary | ICD-10-CM

## 2017-02-20 DIAGNOSIS — E1169 Type 2 diabetes mellitus with other specified complication: Secondary | ICD-10-CM

## 2017-02-20 DIAGNOSIS — I110 Hypertensive heart disease with heart failure: Secondary | ICD-10-CM

## 2017-02-20 DIAGNOSIS — D631 Anemia in chronic kidney disease: Secondary | ICD-10-CM | POA: Diagnosis not present

## 2017-02-20 DIAGNOSIS — I739 Peripheral vascular disease, unspecified: Secondary | ICD-10-CM

## 2017-02-20 DIAGNOSIS — E785 Hyperlipidemia, unspecified: Secondary | ICD-10-CM

## 2017-02-20 DIAGNOSIS — E1121 Type 2 diabetes mellitus with diabetic nephropathy: Secondary | ICD-10-CM | POA: Diagnosis not present

## 2017-02-20 DIAGNOSIS — I209 Angina pectoris, unspecified: Secondary | ICD-10-CM

## 2017-02-20 DIAGNOSIS — F0150 Vascular dementia without behavioral disturbance: Secondary | ICD-10-CM | POA: Diagnosis not present

## 2017-02-20 DIAGNOSIS — IMO0002 Reserved for concepts with insufficient information to code with codable children: Secondary | ICD-10-CM

## 2017-02-20 DIAGNOSIS — I5022 Chronic systolic (congestive) heart failure: Secondary | ICD-10-CM | POA: Diagnosis not present

## 2017-02-20 DIAGNOSIS — I25709 Atherosclerosis of coronary artery bypass graft(s), unspecified, with unspecified angina pectoris: Secondary | ICD-10-CM

## 2017-02-20 NOTE — Progress Notes (Signed)
Location:   [facility] Room Number: 226 A Place of Service:  SNF (31)   CODE STATUS: Full Code  No Known Allergies  Chief Complaint  Patient presents with  . Medical Management of Chronic Issues    1 month follow up    HPI:  He is a long term resident of this facility being seen for the management of his chronic illnesses. Overall his status is stable. He is unable to fully participate in the hpi or ros; but does tell me that he is feeling good. There are no nursing concerns at this time.    Past Medical History:  Diagnosis Date  . Anemia   . Bradycardia   . CAD (coronary artery disease) of bypass graft    Multivessel  . Cataract    bilateral  . CHF (congestive heart failure) (HCC)   . Chronic kidney disease    Stage 3  . Dementia    MMSE 18/30 Feb 2012 [facility]), with behavioral disturbances  . Diabetes mellitus   . Dysphagia   . Hearing deficit   . Hyperlipidemia   . Hypertension   . Neuropathy (HCC)   . PVD (peripheral vascular disease) (HCC)   . Thrombocytopenia (HCC)     Past Surgical History:  Procedure Laterality Date  . CORONARY ARTERY BYPASS GRAFT    . PERIPHERAL VASCULAR CATHETERIZATION N/A 11/28/2016   Procedure: Abdominal Aortogram w/Lower Extremity;  Surgeon: Maeola Harman, MD;  Location: [facility] INVASIVE CV LAB;  Service: Cardiovascular;  Laterality: N/A;    Social History   Social History  . Marital status: Single    Spouse name: N/A  . Number of children: N/A  . Years of education: N/A   Occupational History  . Not on file.   Social History Main Topics  . Smoking status: Former Smoker    Quit date: 11/19/1948  . Smokeless tobacco: Never Used  . Alcohol use No  . Drug use: No  . Sexual activity: Not Currently   Other Topics Concern  . Not on file   Social History Narrative  . No narrative on file   Family History  Problem Relation Age of Onset  . Diabetes Mother   . Dementia Mother     alzheimer  .  Dementia Father   . Stroke Father   . Heart disease Father   . Dementia Sister       VITAL SIGNS BP (!) 148/54   Pulse (!) 54   Temp 98.4 F (36.9 C)   Wt 187 lb 3 oz (84.9 kg)   BMI 28.46 kg/m   Patient's Medications  New Prescriptions   No medications on file  Previous Medications   ACETAMINOPHEN (TYLENOL) 650 MG CR TABLET    Take 650 mg by mouth every 6 (six) hours.   AMINO ACIDS-PROTEIN HYDROLYS (FEEDING SUPPLEMENT, PRO-STAT SUGAR FREE 64,) LIQD    Take 90 mLs by mouth 3 (three) times daily.   ASPIRIN EC 81 MG TABLET    Take 81 mg by mouth daily.   ATORVASTATIN (LIPITOR) 20 MG TABLET    Take 20 mg by mouth at bedtime.   CHOLECALCIFEROL (VITAMIN D) 1000 UNITS TABLET    Take 2,000 Units by mouth daily.   DIVALPROEX (DEPAKOTE SPRINKLE) 125 MG CAPSULE    Take 250 mg by mouth 3 (three) times daily.    ENALAPRIL (VASOTEC) 20 MG TABLET    Take 20 mg by mouth daily.   EYELID CLEANSERS (OCUSOFT EYELID CLEANSING)  PADS    Apply 1 application topically daily.   FERROUS SULFATE 325 (65 FE) MG TABLET    Take 325 mg by mouth daily with breakfast.   GABAPENTIN (NEURONTIN) 100 MG CAPSULE    Take 200 mg by mouth at bedtime.   GUAIFENESIN (ROBITUSSIN) 100 MG/5ML LIQUID    Take 200 mg by mouth every 6 (six) hours as needed for cough.   HYDRALAZINE (APRESOLINE) 50 MG TABLET    Take 50 mg by mouth every 8 (eight) hours.   INSULIN ASPART (NOVOLOG) 100 UNIT/ML INJECTION    Inject 10 Units into the skin 3 (three) times daily with meals. Inject as per sliding scale: if  0-59=0 Call MD ; 60-69 = 0 units, 70-150= 10; 151-450 = 13 units; 450 + = 0 unitsCall MD for CBG < 60 or > 450, subcutaneously before meals related to DM.   INSULIN GLARGINE (LANTUS) 100 UNIT/ML INJECTION    Inject 8 Units into the skin at bedtime.    LINAGLIPTIN (TRADJENTA) 5 MG TABS TABLET    Take 5 mg by mouth daily.   MEMANTINE HCL-DONEPEZIL HCL (NAMZARIC) 28-10 MG CP24    Take 1 capsule by mouth daily.   PROMETHAZINE (PHENERGAN)  25 MG TABLET    Take 12.5 mg by mouth every 6 (six) hours as needed for nausea or vomiting.   SKIN PROTECTANTS, MISC. (CALAZIME SKIN PROTECTANT) PSTE    Apply 1 application topically 2 (two) times daily. To buttocks for incontinence care  Modified Medications   No medications on file  Discontinued Medications   HYDRALAZINE (APRESOLINE) 25 MG TABLET    Take 25 mg by mouth every 8 (eight) hours.     SIGNIFICANT DIAGNOSTIC EXAMS  11-03-15: chest x-ray; Shallow inspiration with atelectasis in the lung bases. Cardiac enlargement. No evidence of active pulmonary disease.   11-03-15: ct of abdomen and pelvis: 1. No definite acute abnormality seen within the abdomen or pelvis. 2. Trace fluid at the right lower quadrant is nonspecific. 3. Diffuse calcification along the abdominal aorta and its branches, including along the superior mesenteric artery, inferior mesenteric artery and at the proximal renal arteries bilaterally. 4. Mild bibasilar atelectasis noted. 5. Diffuse coronary artery calcifications seen. 6. Mild bilateral renal atrophy noted.  11-04-15: 2-d echo: Left ventricle: The cavity size was normal. There was mild concentric hypertrophy. Systolic function was normal. The estimated ejection fraction was in the range of 50% to 55%. Wall motion was normal; there were no regional wall motion abnormalities. Doppler parameters are consistent with abnormal left ventricular relaxation (grade 1 diastolic dysfunction). - Aortic valve: Valve mobility was restricted. Transvalvular velocity was increased, due to stenosis. There was mild stenosis. - Mitral valve: Calcified annulus. There was mild regurgitation. - Left atrium: The atrium was mildly dilated. - Right atrium: The atrium was mildly dilated.  02-04-16: right lower extremity doppler: negative for dvt     LABS REVIEWED:   06-14-16: wbc 5.7; hgb 11.9; hct 34.5; mcv 92.8; plt 112; glucose 158; bun 39; creat 1.52; k+ 5.1; na++ 141; liver  normal albumin 3.4; hgb a1c 6.8 urine micro-albumin 12.5 (on ace)  07-21-16: wbc 7.6; hgb 10.9; hct 34.4; mcv 92.6; plt 183; glucose 255; bun 38.6; creat 1.41; k+ 4.5; na++ 139 chol 126; ldl 49; trig 262; hdl 25 depakote 16  08-29-16: wbc 5.1; hgb 10.7; hct 34.2; mcv 92.6; plt 118  glucose 151; bun 51.6; creat 1.28; k+ 4.6; na++ 143  09-26-16: wbc 6.2; hgb 11.2; hct 36.6;  mcv 99.0; plt 110; glucose 155; bun 29.0; creat 1.25; k+ 5.0; na++ 144; liver normal albumin 3.5 hgb a1c 7.8  01-25-17: hgb a1c 7.3      Review of Systems  Unable to perform ROS: dementia    Physical Exam  Constitutional: No distress.  Overweight   Eyes: Conjunctivae are normal.  Neck: Neck supple. No JVD present. No thyromegaly present.  Cardiovascular: Normal rate, regular rhythm and intact distal pulses.   Respiratory: Effort normal and breath sounds normal. No respiratory distress. He has no wheezes.  GI: Soft. Bowel sounds are normal. He exhibits no distension. There is no tenderness.  Musculoskeletal: trace bilateral lower extremity edema    Able to move all extremities   Lymphadenopathy:    He has no cervical adenopathy.  Neurological: He is alert.  Skin: Skin is warm and dry. He is not diaphoretic.   Psychiatric: He has a normal mood and affect.     ASSESSMENT/ PLAN:  1. Dyslipidemia: will continue lipitor 20 mg daily ldl is 49   2. Anemia of chronic disease: hgb is 11.2; will continue iron daily   3. Hypertension: Has calcification in bilateral renal arteries  will continue vasotec  20 mg daily  hydralazine  50 mg every 8 hours.   4. CAD: status post cabg: no complaint of chest pain present; will continue asa 81 mg daily   5. Vascular dementia: no change in status; his current weight is 187 pounds; will continue namzaric 28-10 mg daily   will continue depakote sprinkles 250 mg three times daily to stabilize mood   6. Diabetes: hgb a1c 7.3  will continue novolog 10 units with meals with an extra 3  units  for cbg >=150 tradjenta 5 mg daily and lantus 8 units nightly   7. CHF: EF 50-55% (11-04-15) he is presently stable will  is presently not on diuretic continue hydralazine 50 mg  three times daily   8. PVD: will continue asa 81 mg daily; tylenol 650 mg every 6 hours for pain management   9. CDK stage III: bun 51.6; creat 1.28; will monitor    Synthia Innocent NP [facility]  Contact [phone] Monday through Friday 8am- 5pm  After hours call [phone]

## 2017-03-22 ENCOUNTER — Encounter: Payer: Self-pay | Admitting: Adult Health

## 2017-03-22 ENCOUNTER — Non-Acute Institutional Stay (SKILLED_NURSING_FACILITY): Payer: Medicare Other | Admitting: Adult Health

## 2017-03-22 DIAGNOSIS — I110 Hypertensive heart disease with heart failure: Secondary | ICD-10-CM

## 2017-03-22 DIAGNOSIS — F0150 Vascular dementia without behavioral disturbance: Secondary | ICD-10-CM | POA: Diagnosis not present

## 2017-03-22 DIAGNOSIS — E785 Hyperlipidemia, unspecified: Secondary | ICD-10-CM

## 2017-03-22 DIAGNOSIS — E1169 Type 2 diabetes mellitus with other specified complication: Secondary | ICD-10-CM | POA: Diagnosis not present

## 2017-03-22 DIAGNOSIS — I209 Angina pectoris, unspecified: Secondary | ICD-10-CM | POA: Diagnosis not present

## 2017-03-22 DIAGNOSIS — E1121 Type 2 diabetes mellitus with diabetic nephropathy: Secondary | ICD-10-CM | POA: Diagnosis not present

## 2017-03-22 DIAGNOSIS — N183 Chronic kidney disease, stage 3 unspecified: Secondary | ICD-10-CM

## 2017-03-22 DIAGNOSIS — I25709 Atherosclerosis of coronary artery bypass graft(s), unspecified, with unspecified angina pectoris: Secondary | ICD-10-CM

## 2017-03-22 DIAGNOSIS — IMO0002 Reserved for concepts with insufficient information to code with codable children: Secondary | ICD-10-CM

## 2017-03-22 DIAGNOSIS — E1165 Type 2 diabetes mellitus with hyperglycemia: Secondary | ICD-10-CM

## 2017-03-22 DIAGNOSIS — D631 Anemia in chronic kidney disease: Secondary | ICD-10-CM | POA: Diagnosis not present

## 2017-03-22 DIAGNOSIS — I739 Peripheral vascular disease, unspecified: Secondary | ICD-10-CM | POA: Diagnosis not present

## 2017-03-22 DIAGNOSIS — I5022 Chronic systolic (congestive) heart failure: Secondary | ICD-10-CM | POA: Diagnosis not present

## 2017-03-22 NOTE — Progress Notes (Signed)
Location:   [facility] Room Number: 226 A Place of Service:  SNF (31)   CODE STATUS: Full Code  No Known Allergies  Chief Complaint  Patient presents with  . Medical Management of Chronic Issues    1 month follow up    HPI:  He is a long term resident of this facility being seen for the management of his chronic illnesses. He tells me that he is feeling good. There are no nursing concerns at this time   Past Medical History:  Diagnosis Date  . Anemia   . Bradycardia   . CAD (coronary artery disease) of bypass graft    Multivessel  . Cataract    bilateral  . CHF (congestive heart failure) (HCC)   . Chronic kidney disease    Stage 3  . Dementia    MMSE 18/30 Feb 2012 [facility]), with behavioral disturbances  . Diabetes mellitus   . Dysphagia   . Hearing deficit   . Hyperlipidemia   . Hypertension   . Neuropathy   . PVD (peripheral vascular disease) (HCC)   . Thrombocytopenia (HCC)     Past Surgical History:  Procedure Laterality Date  . CORONARY ARTERY BYPASS GRAFT    . PERIPHERAL VASCULAR CATHETERIZATION N/A 11/28/2016   Procedure: Abdominal Aortogram w/Lower Extremity;  Surgeon: Maeola Harman, MD;  Location: [facility] INVASIVE CV LAB;  Service: Cardiovascular;  Laterality: N/A;    Social History   Social History  . Marital status: Single    Spouse name: N/A  . Number of children: N/A  . Years of education: N/A   Occupational History  . Not on file.   Social History Main Topics  . Smoking status: Former Smoker    Quit date: 11/19/1948  . Smokeless tobacco: Never Used  . Alcohol use No  . Drug use: No  . Sexual activity: Not Currently   Other Topics Concern  . Not on file   Social History Narrative  . No narrative on file   Family History  Problem Relation Age of Onset  . Diabetes Mother   . Dementia Mother     alzheimer  . Dementia Father   . Stroke Father   . Heart disease Father   . Dementia Sister       VITAL  SIGNS BP 138/68   Pulse 90   Temp 98.1 F (36.7 C)   Resp 18   Ht 5\' 8"  (1.727 m)   Wt 187 lb 3 oz (84.9 kg)   SpO2 98%   BMI 28.46 kg/m   Patient's Medications  New Prescriptions   No medications on file  Previous Medications   ACETAMINOPHEN (TYLENOL) 650 MG CR TABLET    Take 650 mg by mouth every 6 (six) hours.   AMINO ACIDS-PROTEIN HYDROLYS (FEEDING SUPPLEMENT, PRO-STAT SUGAR FREE 64,) LIQD    Take 90 mLs by mouth 3 (three) times daily.   ASPIRIN EC 81 MG TABLET    Take 81 mg by mouth daily.   ATORVASTATIN (LIPITOR) 20 MG TABLET    Take 20 mg by mouth at bedtime.   CHOLECALCIFEROL (VITAMIN D) 1000 UNITS TABLET    Take 2,000 Units by mouth daily.   DIVALPROEX (DEPAKOTE SPRINKLE) 125 MG CAPSULE    Take 250 mg by mouth 3 (three) times daily.    ENALAPRIL (VASOTEC) 20 MG TABLET    Take 20 mg by mouth daily.   EYELID CLEANSERS (OCUSOFT EYELID CLEANSING) PADS    Apply 1  application topically daily.   FERROUS SULFATE 325 (65 FE) MG TABLET    Take 325 mg by mouth daily with breakfast.   GABAPENTIN (NEURONTIN) 100 MG CAPSULE    Take 200 mg by mouth at bedtime.   GUAIFENESIN (ROBITUSSIN) 100 MG/5ML LIQUID    Take 200 mg by mouth every 6 (six) hours as needed for cough.   HYDRALAZINE (APRESOLINE) 50 MG TABLET    Take 50 mg by mouth every 8 (eight) hours.   INSULIN ASPART (NOVOLOG) 100 UNIT/ML INJECTION    Inject as per sliding scale: if  0-59=0 Call MD ; 60-69 = 0 units, 70-150= 10; 151-450 = 13 units; 450 + = 0 unitsCall MD for CBG < 60 or > 450, subcutaneously 3 times daily after meals related to DM.   INSULIN GLARGINE (LANTUS) 100 UNIT/ML INJECTION    Inject 8 Units into the skin at bedtime.    LINAGLIPTIN (TRADJENTA) 5 MG TABS TABLET    Take 5 mg by mouth daily.   MEMANTINE HCL-DONEPEZIL HCL (NAMZARIC) 28-10 MG CP24    Take 1 capsule by mouth at bedtime.    PROMETHAZINE (PHENERGAN) 25 MG TABLET    Take 12.5 mg by mouth every 6 (six) hours as needed for nausea or vomiting.   SKIN  PROTECTANTS, MISC. (CALAZIME SKIN PROTECTANT) PSTE    Apply 1 application topically 2 (two) times daily. To buttocks for incontinence care  Modified Medications   No medications on file  Discontinued Medications   No medications on file     SIGNIFICANT DIAGNOSTIC EXAMS  11-03-15: chest x-ray; Shallow inspiration with atelectasis in the lung bases. Cardiac enlargement. No evidence of active pulmonary disease.   11-03-15: ct of abdomen and pelvis: 1. No definite acute abnormality seen within the abdomen or pelvis. 2. Trace fluid at the right lower quadrant is nonspecific. 3. Diffuse calcification along the abdominal aorta and its branches, including along the superior mesenteric artery, inferior mesenteric artery and at the proximal renal arteries bilaterally. 4. Mild bibasilar atelectasis noted. 5. Diffuse coronary artery calcifications seen. 6. Mild bilateral renal atrophy noted.  11-04-15: 2-d echo: Left ventricle: The cavity size was normal. There was mild concentric hypertrophy. Systolic function was normal. The estimated ejection fraction was in the range of 50% to 55%. Wall motion was normal; there were no regional wall motion abnormalities. Doppler parameters are consistent with abnormal left ventricular relaxation (grade 1 diastolic dysfunction). - Aortic valve: Valve mobility was restricted. Transvalvular velocity was increased, due to stenosis. There was mild stenosis. - Mitral valve: Calcified annulus. There was mild regurgitation. - Left atrium: The atrium was mildly dilated. - Right atrium: The atrium was mildly dilated.  02-04-16: right lower extremity doppler: negative for dvt     LABS REVIEWED:   06-14-16: wbc 5.7; hgb 11.9; hct 34.5; mcv 92.8; plt 112; glucose 158; bun 39; creat 1.52; k+ 5.1; na++ 141; liver normal albumin 3.4; hgb a1c 6.8 urine micro-albumin 12.5 (on ace)  07-21-16: wbc 7.6; hgb 10.9; hct 34.4; mcv 92.6; plt 183; glucose 255; bun 38.6; creat 1.41; k+  4.5; na++ 139 chol 126; ldl 49; trig 262; hdl 25 depakote 16  08-29-16: wbc 5.1; hgb 10.7; hct 34.2; mcv 92.6; plt 118  glucose 151; bun 51.6; creat 1.28; k+ 4.6; na++ 143  09-26-16: wbc 6.2; hgb 11.2; hct 36.6; mcv 99.0; plt 110; glucose 155; bun 29.0; creat 1.25; k+ 5.0; na++ 144; liver normal albumin 3.5 hgb a1c 7.8  01-25-17: chol 145; ldl  92; trig 199; hdl 27  hgb a1c 7.3      Review of Systems  Unable to perform ROS: dementia    Physical Exam  Constitutional: No distress.  Overweight   Eyes: Conjunctivae are normal.  Neck: Neck supple. No JVD present. No thyromegaly present.  Cardiovascular: Normal rate, regular rhythm and intact distal pulses.   Respiratory: Effort normal and breath sounds normal. No respiratory distress. He has no wheezes.  GI: Soft. Bowel sounds are normal. He exhibits no distension. There is no tenderness.  Musculoskeletal: trace bilateral lower extremity edema    Able to move all extremities   Lymphadenopathy:    He has no cervical adenopathy.  Neurological: He is alert.  Skin: Skin is warm and dry. He is not diaphoretic.   Psychiatric: He has a normal mood and affect.     ASSESSMENT/ PLAN:  1. Dyslipidemia: will continue lipitor 20 mg daily ldl is 92   2. Anemia of chronic disease: hgb is 11.2; will continue iron daily   3. Hypertension:  B/p 138/68  Has calcification in bilateral renal arteries  will continue vasotec  20 mg daily  hydralazine  50 mg every 8 hours.   4. CAD: status post cabg: no complaint of chest pain present; will continue asa 81 mg daily   5. Vascular dementia: no change in status; his current weight is 187 pounds; will continue namzaric 28-10 mg daily   will continue depakote sprinkles 250 mg three times daily to stabilize mood   6. Diabetes: hgb a1c 7.3  will continue novolog 10 units with meals with an extra 3 units  for cbg >=150 tradjenta 5 mg daily and lantus 8 units nightly   7. CHF: EF 50-55% (11-04-15) he is presently  stable will  is presently not on diuretic continue hydralazine 50 mg  three times daily   8. PVD: will continue asa 81 mg daily; tylenol 650 mg every 6 hours for pain management takes neurontin 200 mg nightly   9. CDK stage III: bun 51.6; creat 1.28; will monitor   Will check cbc; cmp depakote and ammonia    MD is aware of resident's narcotic use and is in agreement with current plan of care. We will attempt to wean resident as apropriate     Synthia Innocent NP [facility] Adult [facility]  Contact [phone] Monday through Friday 8am- 5pm  After hours call [phone]

## 2017-04-23 ENCOUNTER — Encounter: Payer: Self-pay | Admitting: Adult Health

## 2017-04-23 ENCOUNTER — Non-Acute Institutional Stay (SKILLED_NURSING_FACILITY): Payer: Medicare Other | Admitting: Adult Health

## 2017-04-23 DIAGNOSIS — I209 Angina pectoris, unspecified: Secondary | ICD-10-CM | POA: Diagnosis not present

## 2017-04-23 DIAGNOSIS — D631 Anemia in chronic kidney disease: Secondary | ICD-10-CM

## 2017-04-23 DIAGNOSIS — I110 Hypertensive heart disease with heart failure: Secondary | ICD-10-CM

## 2017-04-23 DIAGNOSIS — E785 Hyperlipidemia, unspecified: Secondary | ICD-10-CM

## 2017-04-23 DIAGNOSIS — I25709 Atherosclerosis of coronary artery bypass graft(s), unspecified, with unspecified angina pectoris: Secondary | ICD-10-CM

## 2017-04-23 DIAGNOSIS — E1121 Type 2 diabetes mellitus with diabetic nephropathy: Secondary | ICD-10-CM

## 2017-04-23 DIAGNOSIS — E1169 Type 2 diabetes mellitus with other specified complication: Secondary | ICD-10-CM | POA: Diagnosis not present

## 2017-04-23 DIAGNOSIS — I5022 Chronic systolic (congestive) heart failure: Secondary | ICD-10-CM | POA: Diagnosis not present

## 2017-04-23 DIAGNOSIS — IMO0002 Reserved for concepts with insufficient information to code with codable children: Secondary | ICD-10-CM

## 2017-04-23 DIAGNOSIS — N183 Chronic kidney disease, stage 3 unspecified: Secondary | ICD-10-CM

## 2017-04-23 DIAGNOSIS — E1165 Type 2 diabetes mellitus with hyperglycemia: Secondary | ICD-10-CM

## 2017-04-23 NOTE — Progress Notes (Signed)
Location:   [facility] Room Number: 226 A Place of Service:  SNF (31)   CODE STATUS: Full Code  No Known Allergies  Chief Complaint  Patient presents with  . Medical Management of Chronic Issues    1 month follow up    HPI:  He is a long term resident of this facility being seen for the management of his chronic illnesses. Overall his status is stable. He tells that he is feeling good and has no concerns. There are no nursing concerns at this time.    Past Medical History:  Diagnosis Date  . Anemia   . Bradycardia   . CAD (coronary artery disease) of bypass graft    Multivessel  . Cataract    bilateral  . CHF (congestive heart failure) (HCC)   . Chronic kidney disease    Stage 3  . Dementia    MMSE 18/30 Feb 2012 [facility]), with behavioral disturbances  . Diabetes mellitus   . Dysphagia   . Hearing deficit   . Hyperlipidemia   . Hypertension   . Neuropathy   . PVD (peripheral vascular disease) (HCC)   . Thrombocytopenia (HCC)     Past Surgical History:  Procedure Laterality Date  . CORONARY ARTERY BYPASS GRAFT    . PERIPHERAL VASCULAR CATHETERIZATION N/A 11/28/2016   Procedure: Abdominal Aortogram w/Lower Extremity;  Surgeon: Maeola Harman, MD;  Location: [facility] INVASIVE CV LAB;  Service: Cardiovascular;  Laterality: N/A;    Social History   Social History  . Marital status: Single    Spouse name: N/A  . Number of children: N/A  . Years of education: N/A   Occupational History  . Not on file.   Social History Main Topics  . Smoking status: Former Smoker    Quit date: 11/19/1948  . Smokeless tobacco: Never Used  . Alcohol use No  . Drug use: No  . Sexual activity: Not Currently   Other Topics Concern  . Not on file   Social History Narrative  . No narrative on file   Family History  Problem Relation Age of Onset  . Diabetes Mother   . Dementia Mother        alzheimer  . Dementia Father   . Stroke Father   . Heart  disease Father   . Dementia Sister       VITAL SIGNS BP 131/70   Pulse 64   Temp (!) 94.6 F (34.8 C)   Resp (!) 24   Ht 5\' 8"  (1.727 m)   Wt 187 lb 13 oz (85.2 kg)   SpO2 97%   BMI 28.56 kg/m   Patient's Medications  New Prescriptions   No medications on file  Previous Medications   ACETAMINOPHEN (TYLENOL) 650 MG CR TABLET    Take 650 mg by mouth every 6 (six) hours.   AMINO ACIDS-PROTEIN HYDROLYS (FEEDING SUPPLEMENT, PRO-STAT SUGAR FREE 64,) LIQD    Take 90 mLs by mouth 3 (three) times daily.   ASPIRIN EC 81 MG TABLET    Take 81 mg by mouth daily.   ATORVASTATIN (LIPITOR) 20 MG TABLET    Take 20 mg by mouth at bedtime.   CHOLECALCIFEROL (VITAMIN D) 1000 UNITS TABLET    Take 2,000 Units by mouth daily.   DIVALPROEX (DEPAKOTE SPRINKLE) 125 MG CAPSULE    Take 250 mg by mouth 3 (three) times daily.    ENALAPRIL (VASOTEC) 20 MG TABLET    Take 20 mg by mouth  daily.   EYELID CLEANSERS (OCUSOFT EYELID CLEANSING) PADS    Apply 1 application topically daily.   FERROUS SULFATE 325 (65 FE) MG TABLET    Take 325 mg by mouth daily with breakfast.   GABAPENTIN (NEURONTIN) 100 MG CAPSULE    Take 200 mg by mouth at bedtime.   GUAIFENESIN (ROBITUSSIN) 100 MG/5ML LIQUID    Take 200 mg by mouth every 6 (six) hours as needed for cough.   HYDRALAZINE (APRESOLINE) 50 MG TABLET    Take 50 mg by mouth every 8 (eight) hours.   INSULIN ASPART (NOVOLOG) 100 UNIT/ML INJECTION    Inject as per sliding scale: if  0-59=0 Call MD ; 60-69 = 0 units, 70-150= 10; 151-450 = 13 units; 450 + = 0 unitsCall MD for CBG < 60 or > 450, subcutaneously 3 times daily after meals related to DM.   INSULIN GLARGINE (LANTUS) 100 UNIT/ML INJECTION    Inject 8 Units into the skin at bedtime.    LINAGLIPTIN (TRADJENTA) 5 MG TABS TABLET    Take 5 mg by mouth daily.   MEMANTINE HCL-DONEPEZIL HCL (NAMZARIC) 28-10 MG CP24    Take 1 capsule by mouth at bedtime.    PROMETHAZINE (PHENERGAN) 25 MG TABLET    Take 12.5 mg by mouth every 6  (six) hours as needed for nausea or vomiting.   SKIN PROTECTANTS, MISC. (CALAZIME SKIN PROTECTANT) PSTE    Apply 1 application topically 2 (two) times daily. To buttocks for incontinence care  Modified Medications   No medications on file  Discontinued Medications   No medications on file     SIGNIFICANT DIAGNOSTIC EXAMS  11-03-15: chest x-ray; Shallow inspiration with atelectasis in the lung bases. Cardiac enlargement. No evidence of active pulmonary disease.   11-03-15: ct of abdomen and pelvis: 1. No definite acute abnormality seen within the abdomen or pelvis. 2. Trace fluid at the right lower quadrant is nonspecific. 3. Diffuse calcification along the abdominal aorta and its branches, including along the superior mesenteric artery, inferior mesenteric artery and at the proximal renal arteries bilaterally. 4. Mild bibasilar atelectasis noted. 5. Diffuse coronary artery calcifications seen. 6. Mild bilateral renal atrophy noted.  11-04-15: 2-d echo: Left ventricle: The cavity size was normal. There was mild concentric hypertrophy. Systolic function was normal. The estimated ejection fraction was in the range of 50% to 55%. Wall motion was normal; there were no regional wall motion abnormalities. Doppler parameters are consistent with abnormal left ventricular relaxation (grade 1 diastolic dysfunction). - Aortic valve: Valve mobility was restricted. Transvalvular velocity was increased, due to stenosis. There was mild stenosis. - Mitral valve: Calcified annulus. There was mild regurgitation. - Left atrium: The atrium was mildly dilated. - Right atrium: The atrium was mildly dilated.  02-04-16: right lower extremity doppler: negative for dvt     LABS REVIEWED:   06-14-16: wbc 5.7; hgb 11.9; hct 34.5; mcv 92.8; plt 112; glucose 158; bun 39; creat 1.52; k+ 5.1; na++ 141; liver normal albumin 3.4; hgb a1c 6.8 urine micro-albumin 12.5 (on ace)  07-21-16: wbc 7.6; hgb 10.9; hct 34.4; mcv  92.6; plt 183; glucose 255; bun 38.6; creat 1.41; k+ 4.5; na++ 139 chol 126; ldl 49; trig 262; hdl 25 depakote 16  08-29-16: wbc 5.1; hgb 10.7; hct 34.2; mcv 92.6; plt 118  glucose 151; bun 51.6; creat 1.28; k+ 4.6; na++ 143  09-26-16: wbc 6.2; hgb 11.2; hct 36.6; mcv 99.0; plt 110; glucose 155; bun 29.0; creat 1.25; k+ 5.0;  na++ 144; liver normal albumin 3.5 hgb a1c 7.8  01-25-17: chol 145; ldl 92; trig 199; hdl 27  hgb a1c 7.3      Review of Systems  Unable to perform ROS: dementia    Physical Exam  Constitutional: No distress.  Overweight   Eyes: Conjunctivae are normal.  Neck: Neck supple. No JVD present. No thyromegaly present.  Cardiovascular: Normal rate, regular rhythm and intact distal pulses.   Respiratory: Effort normal and breath sounds normal. No respiratory distress. He has no wheezes.  GI: Soft. Bowel sounds are normal. He exhibits no distension. There is no tenderness.  Musculoskeletal: trace bilateral lower extremity edema    Able to move all extremities   Lymphadenopathy:    He has no cervical adenopathy.  Neurological: He is alert.  Skin: Skin is warm and dry. He is not diaphoretic.   Psychiatric: He has a normal mood and affect.     ASSESSMENT/ PLAN:  1. Dyslipidemia: will continue lipitor 20 mg daily ldl is 92   2. Anemia of chronic disease: hgb is 11.2; will continue iron daily   3. Hypertension:  B/p 131/70  Has calcification in bilateral renal arteries  will continue vasotec  20 mg daily  hydralazine  50 mg every 8 hours.   4. CAD: status post cabg: no complaint of chest pain present; will continue asa 81 mg daily   5. Vascular dementia: no change in status; his current weight is 187 pounds; will continue namzaric 28-10 mg daily   will continue depakote sprinkles 250 mg three times daily to stabilize mood   6. Diabetes: hgb a1c 7.3  will continue novolog 10 units with meals with an extra 3 units  for cbg >=150 tradjenta 5 mg daily and lantus 8 units  nightly   7. CHF: EF 50-55% (11-04-15) he is presently stable will  is presently not on diuretic continue hydralazine 50 mg  three times daily   8. PVD: will continue asa 81 mg daily; tylenol 650 mg every 6 hours for pain management takes neurontin 200 mg nightly   9. CDK stage III: bun 51.6; creat 1.28; will monitor   Will check cbc; cmp depakote and ammonia    MD is aware of resident's narcotic use and is in agreement with current plan of care. We will attempt to wean resident as apropriate     Synthia Innocenteborah Yarethzy Croak NP [facility]  Contact [phone] Monday through Friday 8am- 5pm  After hours call [phone]

## 2017-04-24 LAB — HEPATIC FUNCTION PANEL
ALT: 8 — AB (ref 10–40)
AST: 7 — AB (ref 14–40)
Alkaline Phosphatase: 58 (ref 25–125)
BILIRUBIN, TOTAL: 0.2

## 2017-04-24 LAB — CBC AND DIFFERENTIAL
HEMATOCRIT: 36 — AB (ref 41–53)
Hemoglobin: 11.4 — AB (ref 13.5–17.5)
NEUTROS ABS: 3
Platelets: 107 — AB (ref 150–399)
WBC: 6.3

## 2017-04-24 LAB — BASIC METABOLIC PANEL
BUN: 35 — AB (ref 4–21)
Creatinine: 1.4 — AB (ref 0.6–1.3)
GLUCOSE: 243
Potassium: 4.6 (ref 3.4–5.3)
SODIUM: 145 (ref 137–147)

## 2017-05-21 ENCOUNTER — Encounter: Payer: Self-pay | Admitting: Adult Health

## 2017-05-21 ENCOUNTER — Non-Acute Institutional Stay (SKILLED_NURSING_FACILITY): Payer: Medicare Other | Admitting: Adult Health

## 2017-05-21 DIAGNOSIS — N183 Chronic kidney disease, stage 3 unspecified: Secondary | ICD-10-CM

## 2017-05-21 DIAGNOSIS — D631 Anemia in chronic kidney disease: Secondary | ICD-10-CM

## 2017-05-21 DIAGNOSIS — E785 Hyperlipidemia, unspecified: Secondary | ICD-10-CM

## 2017-05-21 DIAGNOSIS — I5022 Chronic systolic (congestive) heart failure: Secondary | ICD-10-CM

## 2017-05-21 DIAGNOSIS — E1169 Type 2 diabetes mellitus with other specified complication: Secondary | ICD-10-CM

## 2017-05-21 DIAGNOSIS — I739 Peripheral vascular disease, unspecified: Secondary | ICD-10-CM | POA: Diagnosis not present

## 2017-05-21 DIAGNOSIS — E1121 Type 2 diabetes mellitus with diabetic nephropathy: Secondary | ICD-10-CM

## 2017-05-21 DIAGNOSIS — F0150 Vascular dementia without behavioral disturbance: Secondary | ICD-10-CM | POA: Diagnosis not present

## 2017-05-21 DIAGNOSIS — I110 Hypertensive heart disease with heart failure: Secondary | ICD-10-CM | POA: Diagnosis not present

## 2017-05-21 DIAGNOSIS — IMO0002 Reserved for concepts with insufficient information to code with codable children: Secondary | ICD-10-CM

## 2017-05-21 DIAGNOSIS — E1165 Type 2 diabetes mellitus with hyperglycemia: Secondary | ICD-10-CM

## 2017-05-21 NOTE — Progress Notes (Signed)
Location:   [facility] Room Number: 226 A Place of Service:  SNF (31)   CODE STATUS: Full Code  No Known Allergies  Chief Complaint  Patient presents with  . Medical Management of Chronic Issues    1 month follow up    HPI:  He is a 83 year old long term resident of this facility being seen for the management of his chronic illnesses. Overall there is little change in his status. He tells me that he is feeling good. He does spend nearly all of his time in bed per his choice. There are nursing concerns at this time.    Past Medical History:  Diagnosis Date  . Anemia   . Bradycardia   . CAD (coronary artery disease) of bypass graft    Multivessel  . Cataract    bilateral  . CHF (congestive heart failure) (HCC)   . Chronic kidney disease    Stage 3  . Dementia    MMSE 18/30 Feb 2012 [facility](Hospital), with behavioral disturbances  . Diabetes mellitus   . Dysphagia   . Hearing deficit   . Hyperlipidemia   . Hypertension   . Neuropathy   . PVD (peripheral vascular disease) (HCC)   . Thrombocytopenia (HCC)     Past Surgical History:  Procedure Laterality Date  . CORONARY ARTERY BYPASS GRAFT    . PERIPHERAL VASCULAR CATHETERIZATION N/A 11/28/2016   Procedure: Abdominal Aortogram w/Lower Extremity;  Surgeon: Maeola HarmanBrandon Christopher Cain, MD;  [facility];  Service: Cardiovascular;  Laterality: N/A;    Social History   Social History  . Marital status: Single    Spouse name: N/A  . Number of children: N/A  . Years of education: N/A   Occupational History  . Not on file.   Social History Main Topics  . Smoking status: Former Smoker    Quit date: 11/19/1948  . Smokeless tobacco: Never Used  . Alcohol use No  . Drug use: No  . Sexual activity: Not Currently   Other Topics Concern  . Not on file   Social History Narrative  . No narrative on file   Family History  Problem Relation Age of Onset  . Diabetes Mother   . Dementia Mother       alzheimer  . Dementia Father   . Stroke Father   . Heart disease Father   . Dementia Sister       VITAL SIGNS BP 123/67   Pulse (!) 53   Temp 97.6 F (36.4 C)   Resp 17   Ht 5\' 8"  (1.727 m)   Wt 201 lb (91.2 kg)   SpO2 97%   BMI 30.56 kg/m   Patient's Medications  New Prescriptions   No medications on file  Previous Medications   ACETAMINOPHEN (TYLENOL) 650 MG CR TABLET    Take 650 mg by mouth every 6 (six) hours.   ASPIRIN EC 81 MG TABLET    Take 81 mg by mouth daily.   ATORVASTATIN (LIPITOR) 20 MG TABLET    Take 20 mg by mouth at bedtime.   CHOLECALCIFEROL (VITAMIN D) 1000 UNITS TABLET    Take 2,000 Units by mouth daily.   DIVALPROEX (DEPAKOTE SPRINKLE) 125 MG CAPSULE    Take 250 mg by mouth 3 (three) times daily.    ENALAPRIL (VASOTEC) 20 MG TABLET    Take 20 mg by mouth daily.   EYELID CLEANSERS (OCUSOFT EYELID CLEANSING) PADS    Apply 1  application topically daily.   FERROUS SULFATE 325 (65 FE) MG TABLET    Take 325 mg by mouth daily with breakfast.   GABAPENTIN (NEURONTIN) 100 MG CAPSULE    Take 200 mg by mouth at bedtime.   GUAIFENESIN (ROBITUSSIN) 100 MG/5ML LIQUID    Take 200 mg by mouth every 6 (six) hours as needed for cough.   HYDRALAZINE (APRESOLINE) 50 MG TABLET    Take 50 mg by mouth every 8 (eight) hours.   INSULIN ASPART (NOVOLOG) 100 UNIT/ML INJECTION    Inject as per sliding scale: if  0-59=0 Call MD ; 60-69 = 0 units, 70-150= 10; 151-450 = 13 units; 450 + = 0 unitsCall MD for CBG < 60 or > 450, subcutaneously 3 times daily after meals related to DM.   INSULIN GLARGINE (LANTUS) 100 UNIT/ML INJECTION    Inject 8 Units into the skin at bedtime.    IVERMECTIN PO    Take 18 mg by mouth. One time a day every Friday for rash for 2 administrations   LINAGLIPTIN (TRADJENTA) 5 MG TABS TABLET    Take 5 mg by mouth daily.   MEMANTINE HCL-DONEPEZIL HCL (NAMZARIC) 28-10 MG CP24    Take 1 capsule by mouth at bedtime.    PROMETHAZINE (PHENERGAN) 25 MG TABLET    Take  12.5 mg by mouth every 6 (six) hours as needed for nausea or vomiting.   SKIN PROTECTANTS, MISC. (CALAZIME SKIN PROTECTANT) PSTE    Apply 1 application topically 2 (two) times daily. To buttocks for incontinence care  Modified Medications   No medications on file  Discontinued Medications   AMINO ACIDS-PROTEIN HYDROLYS (FEEDING SUPPLEMENT, PRO-STAT SUGAR FREE 64,) LIQD    Take 90 mLs by mouth 3 (three) times daily.     SIGNIFICANT DIAGNOSTIC EXAMS  11-03-15: chest x-ray; Shallow inspiration with atelectasis in the lung bases. Cardiac enlargement. No evidence of active pulmonary disease.   11-03-15: ct of abdomen and pelvis: 1. No definite acute abnormality seen within the abdomen or pelvis. 2. Trace fluid at the right lower quadrant is nonspecific. 3. Diffuse calcification along the abdominal aorta and its branches, including along the superior mesenteric artery, inferior mesenteric artery and at the proximal renal arteries bilaterally. 4. Mild bibasilar atelectasis noted. 5. Diffuse coronary artery calcifications seen. 6. Mild bilateral renal atrophy noted.  11-04-15: 2-d echo: Left ventricle: The cavity size was normal. There was mild concentric hypertrophy. Systolic function was normal. The estimated ejection fraction was in the range of 50% to 55%. Wall motion was normal; there were no regional wall motion abnormalities. Doppler parameters are consistent with abnormal left ventricular relaxation (grade 1 diastolic dysfunction). - Aortic valve: Valve mobility was restricted. Transvalvular velocity was increased, due to stenosis. There was mild stenosis. - Mitral valve: Calcified annulus. There was mild regurgitation. - Left atrium: The atrium was mildly dilated. - Right atrium: The atrium was mildly dilated.  02-04-16: right lower extremity doppler: negative for dvt     LABS REVIEWED:   06-14-16: wbc 5.7; hgb 11.9; hct 34.5; mcv 92.8; plt 112; glucose 158; bun 39; creat 1.52; k+  5.1; na++ 141; liver normal albumin 3.4; hgb a1c 6.8 urine micro-albumin 12.5 (on ace)  07-21-16: wbc 7.6; hgb 10.9; hct 34.4; mcv 92.6; plt 183; glucose 255; bun 38.6; creat 1.41; k+ 4.5; na++ 139 chol 126; ldl 49; trig 262; hdl 25 depakote 16  08-29-16: wbc 5.1; hgb 10.7; hct 34.2; mcv 92.6; plt 118  glucose 151; bun  51.6; creat 1.28; k+ 4.6; na++ 143  09-26-16: wbc 6.2; hgb 11.2; hct 36.6; mcv 99.0; plt 110; glucose 155; bun 29.0; creat 1.25; k+ 5.0; na++ 144; liver normal albumin 3.5 hgb a1c 7.8  01-25-17: chol 145; ldl 92; trig 199; hdl 27  hgb a1c 7.3 04-24-17: wbc 6.3; hgb 11.4; hct 36.2; mcv 97.2; plt 107; glucose 243; bun 35.1; creat 1.40; k+ 4.6; na++ 145; ca 8.0; liver normal albumin 3.1; ammonia 48      Review of Systems  Unable to perform ROS: dementia    Physical Exam  Constitutional: No distress.  Overweight   Eyes: Conjunctivae are normal.  Neck: Neck supple. No JVD present. No thyromegaly present.  Cardiovascular: Normal rate, regular rhythm and intact distal pulses.   Respiratory: Effort normal and breath sounds normal. No respiratory distress. He has no wheezes.  GI: Soft. Bowel sounds are normal. He exhibits no distension. There is no tenderness.  Musculoskeletal: trace bilateral lower extremity edema    Able to move all extremities   Lymphadenopathy:    He has no cervical adenopathy.  Neurological: He is alert.  Skin: Skin is warm and dry. He is not diaphoretic.   Psychiatric: He has a normal mood and affect.     ASSESSMENT/ PLAN:  1. Dyslipidemia: will continue lipitor 20 mg daily ldl is 92   2. Anemia of chronic disease: hgb is 11.4; will continue iron daily   3. Hypertension:  B/p 123/67  Has calcification in bilateral renal arteries  will continue vasotec  20 mg daily  hydralazine  50 mg every 8 hours.   4. CAD: status post cabg: no complaint of chest pain present; will continue asa 81 mg daily   5. Vascular dementia: no change in status; his current weight  is 201 pounds; will continue namzaric 28-10 mg daily   will continue depakote sprinkles 250 mg three times daily to stabilize mood   6. Diabetes: hgb a1c 7.3  will continue novolog 10 units with meals with an extra 3 units  for cbg >=150 tradjenta 5 mg daily and lantus 8 units nightly   7. CHF: EF 50-55% (11-04-15) he is presently stable will  is presently not on diuretic continue hydralazine 50 mg  three times daily   8. PVD: will continue asa 81 mg daily; tylenol 650 mg every 6 hours for pain management takes neurontin 200 mg nightly   9. CDK stage III: bun 35.1; creat 1.40; will monitor   Will check hgb a1c and depakote leve On 06-15-17: will get urine for micro-albumin     MD is aware of resident's narcotic use and is in agreement with current plan of care. We will attempt to wean resident as apropriate    Synthia Innocent NP [facility]  Contact [phone] Monday through Friday 8am- 5pm  After hours call [phone]

## 2017-05-22 LAB — HEMOGLOBIN A1C: Hemoglobin A1C: 7.9

## 2017-05-23 ENCOUNTER — Non-Acute Institutional Stay (SKILLED_NURSING_FACILITY): Payer: Medicare Other

## 2017-05-23 DIAGNOSIS — Z0000 Encounter for general adult medical examination without abnormal findings: Secondary | ICD-10-CM | POA: Diagnosis not present

## 2017-05-23 NOTE — Progress Notes (Signed)
Subjective:   Travis Coleman is a 83 y.o. male who presents for Medicare Annual/Subsequent preventive examination at [facility].    Objective:    Vitals: BP 120/60 (BP Location: Left Arm, Patient Position: Supine)   Pulse (!) 55   Temp 97.6 F (36.4 C) (Oral)   Ht 5\' 8"  (1.727 m)   Wt 201 lb (91.2 kg)   SpO2 98%   BMI 30.56 kg/m   Body mass index is 30.56 kg/m.  Tobacco History  Smoking Status  . Former Smoker  . Quit date: 11/19/1948  Smokeless Tobacco  . Never Used     Counseling given: Not Answered   Past Medical History:  Diagnosis Date  . Anemia   . Bradycardia   . CAD (coronary artery disease) of bypass graft    Multivessel  . Cataract    bilateral  . CHF (congestive heart failure) (HCC)   . Chronic kidney disease    Stage 3  . Dementia    MMSE 18/30 Feb 2012 [facility]), with behavioral disturbances  . Diabetes mellitus   . Dysphagia   . Hearing deficit   . Hyperlipidemia   . Hypertension   . Neuropathy   . PVD (peripheral vascular disease) (HCC)   . Thrombocytopenia (HCC)    Past Surgical History:  Procedure Laterality Date  . CORONARY ARTERY BYPASS GRAFT    . PERIPHERAL VASCULAR CATHETERIZATION N/A 11/28/2016   Procedure: Abdominal Aortogram w/Lower Extremity;  Surgeon: Travis Harman, MD;  Location: [facility] INVASIVE CV LAB;  Service: Cardiovascular;  Laterality: N/A;   Family History  Problem Relation Age of Onset  . Diabetes Mother   . Dementia Mother        alzheimer  . Dementia Father   . Stroke Father   . Heart disease Father   . Dementia Sister    History  Sexual Activity  . Sexual activity: Not Currently    Outpatient Encounter Prescriptions as of 05/23/2017  Medication Sig  . acetaminophen (TYLENOL) 650 MG CR tablet Take 650 mg by mouth every 6 (six) hours.  Marland Kitchen aspirin EC 81 MG tablet Take 81 mg by mouth daily.  Marland Kitchen atorvastatin (LIPITOR) 20 MG tablet Take 20 mg by mouth at bedtime.  . cholecalciferol (VITAMIN D)  1000 UNITS tablet Take 2,000 Units by mouth daily.  . divalproex (DEPAKOTE SPRINKLE) 125 MG capsule Take 250 mg by mouth 3 (three) times daily.   . enalapril (VASOTEC) 20 MG tablet Take 20 mg by mouth daily.  . Eyelid Cleansers (OCUSOFT EYELID CLEANSING) PADS Apply 1 application topically daily.  . ferrous sulfate 325 (65 FE) MG tablet Take 325 mg by mouth daily with breakfast.  . gabapentin (NEURONTIN) 100 MG capsule Take 200 mg by mouth at bedtime.  Marland Kitchen guaiFENesin (ROBITUSSIN) 100 MG/5ML liquid Take 200 mg by mouth every 6 (six) hours as needed for cough.  . hydrALAZINE (APRESOLINE) 50 MG tablet Take 50 mg by mouth every 8 (eight) hours.  . insulin aspart (NOVOLOG) 100 UNIT/ML injection Inject as per sliding scale: if  0-59=0 Call MD ; 60-69 = 0 units, 70-150= 10; 151-450 = 13 units; 450 + = 0 unitsCall MD for CBG < 60 or > 450, subcutaneously 3 times daily after meals related to DM.  Marland Kitchen insulin glargine (LANTUS) 100 UNIT/ML injection Inject 8 Units into the skin at bedtime.   . IVERMECTIN PO Take 18 mg by mouth. One time a day every Friday for rash for 2  administrations  . linagliptin (TRADJENTA) 5 MG TABS tablet Take 5 mg by mouth daily.  . Memantine HCl-Donepezil HCl (NAMZARIC) 28-10 MG CP24 Take 1 capsule by mouth at bedtime.   . promethazine (PHENERGAN) 25 MG tablet Take 12.5 mg by mouth every 6 (six) hours as needed for nausea or vomiting.  . Skin Protectants, Misc. (CALAZIME SKIN PROTECTANT) PSTE Apply 1 application topically 2 (two) times daily. To buttocks for incontinence care   No facility-administered encounter medications on file as of 05/23/2017.     Activities of Daily Living In your present state of health, do you have any difficulty performing the following activities: 05/23/2017 02/07/2017  Hearing? Malvin JohnsY Y  Vision? Y Y  Difficulty concentrating or making decisions? Malvin JohnsY Y  Walking or climbing stairs? Y Y  Dressing or bathing? Y Y  Doing errands, shopping? Y -  Quarry managerreparing Food and  eating ? Y -  Using the Toilet? Y -  In the past six months, have you accidently leaked urine? Y -  Do you have problems with loss of bowel control? Y -  Managing your Medications? Y -  Managing your Finances? Y -  Housekeeping or managing your Housekeeping? Y -  Some recent data might be hidden    Patient Care Team: Travis Coleman, Henry, MD as PCP - General (Family Medicine)   Assessment:     Exercise Activities and Dietary recommendations Current Exercise Habits: The patient does not participate in regular exercise at present, Exercise limited by: neurologic condition(s)  Goals    None     Fall Risk Fall Risk  05/23/2017  Falls in the past year? Yes  Number falls in past yr: 1  Injury with Fall? No   Depression Screen PHQ 2/9 Scores 05/23/2017  PHQ - 2 Score 0    Cognitive Function     6CIT Screen 05/23/2017  What Year? 4 points  What month? 3 points  What time? 3 points  Count back from 20 4 points  Months in reverse 4 points  Repeat phrase 10 points  Total Score 28    Immunization History  Administered Date(s) Administered  . Influenza,inj,Quad PF,36+ Mos 12/06/2013  . Influenza-Unspecified 12/28/2015, 08/26/2016  . PPD Test 08/10/2016, 08/17/2016  . Pneumococcal Polysaccharide-23 12/06/2013   Screening Tests Health Maintenance  Topic Date Due  . FOOT EXAM  01/23/2018 (Originally 04/02/1942)  . TETANUS/TDAP  01/11/2044 (Originally 04/02/1951)  . PNA vac Low Risk Adult (2 of 2 - PCV13) 01/11/2044 (Originally 12/06/2014)  . INFLUENZA VACCINE  06/19/2017  . OPHTHALMOLOGY EXAM  07/26/2017  . HEMOGLOBIN A1C  07/28/2017      Plan:    I have personally reviewed and addressed the Medicare Annual Wellness questionnaire and have noted the following in the patient's chart:  A. Medical and social history B. Use of alcohol, tobacco or illicit drugs  C. Current medications and supplements D. Functional ability and status E.  Nutritional status F.  Physical  activity G. Advance directives H. List of other physicians I.  Hospitalizations, surgeries, and ER visits in previous 12 months J.  Vitals K. Screenings to include hearing, vision, cognitive, depression L. Referrals and appointments - none  In addition, I have reviewed and discussed with patient certain preventive protocols, quality metrics, and best practice recommendations. A written personalized care plan for preventive services as well as general preventive health recommendations were provided to patient.  See attached scanned questionnaire for additional information.   Signed,   Annetta MawSara Gonthier, RN Nurse  Health Advisor   Quick Notes   Health Maintenance: PNA 13, foot exam, TDAP due     Abnormal Screen: 6 Cit-28     Patient Concerns: Nonw     Nurse Concerns: None

## 2017-05-23 NOTE — Patient Instructions (Signed)
[name] , Thank you for taking time to come for your Medicare Wellness Visit. I appreciate your ongoing commitment to your health goals. Please review the following plan we discussed and let me know if I can assist you in the future.   Screening recommendations/referrals: Colonoscopy up to date, pt over age 81 Recommended yearly ophthalmology/optometry visit for glaucoma screening and checkup Recommended yearly dental visit for hygiene and checkup  Vaccinations: Influenza vaccine up to date. Due 08/26/17 Pneumococcal vaccine 13 due, facility declines Tdap vaccine due Shingles vaccine not in records    Advanced directives: Need a copy for chart  Conditions/risks identified: None  Next appointment: Dr. Montez Moritaarter makes rounds  Preventive Care 65 Years and Older, Male Preventive care refers to lifestyle choices and visits with your health care provider that can promote health and wellness. What does preventive care include?  A yearly physical exam. This is also called an annual well check.  Dental exams once or twice a year.  Routine eye exams. Ask your health care provider how often you should have your eyes checked.  Personal lifestyle choices, including:  Daily care of your teeth and gums.  Regular physical activity.  Eating a healthy diet.  Avoiding tobacco and drug use.  Limiting alcohol use.  Practicing safe sex.  Taking low doses of aspirin every day.  Taking vitamin and mineral supplements as recommended by your health care provider. What happens during an annual well check? The services and screenings done by your health care provider during your annual well check will depend on your age, overall health, lifestyle risk factors, and family history of disease. Counseling  Your health care provider may ask you questions about your:  Alcohol use.  Tobacco use.  Drug use.  Emotional well-being.  Home and relationship well-being.  Sexual activity.  Eating  habits.  History of falls.  Memory and ability to understand (cognition).  Work and work Astronomerenvironment. Screening  You may have the following tests or measurements:  Height, weight, and BMI.  Blood pressure.  Lipid and cholesterol levels. These may be checked every 5 years, or more frequently if you are over 81 years old.  Skin check.  Lung cancer screening. You may have this screening every year starting at age 81 if you have a 30-pack-year history of smoking and currently smoke or have quit within the past 15 years.  Fecal occult blood test (FOBT) of the stool. You may have this test every year starting at age 81.  Flexible sigmoidoscopy or colonoscopy. You may have a sigmoidoscopy every 5 years or a colonoscopy every 10 years starting at age 81.  Prostate cancer screening. Recommendations will vary depending on your family history and other risks.  Hepatitis C blood test.  Hepatitis B blood test.  Sexually transmitted disease (STD) testing.  Diabetes screening. This is done by checking your blood sugar (glucose) after you have not eaten for a while (fasting). You may have this done every 1-3 years.  Abdominal aortic aneurysm (AAA) screening. You may need this if you are a current or former smoker.  Osteoporosis. You may be screened starting at age 81 if you are at high risk. Talk with your health care provider about your test results, treatment options, and if necessary, the need for more tests. Vaccines  Your health care provider may recommend certain vaccines, such as:  Influenza vaccine. This is recommended every year.  Tetanus, diphtheria, and acellular pertussis (Tdap, Td) vaccine. You may need a Td  booster every 10 years.  Zoster vaccine. You may need this after age 11.  Pneumococcal 13-valent conjugate (PCV13) vaccine. One dose is recommended after age 6.  Pneumococcal polysaccharide (PPSV23) vaccine. One dose is recommended after age 28. Talk to your health  care provider about which screenings and vaccines you need and how often you need them. This information is not intended to replace advice given to you by your health care provider. Make sure you discuss any questions you have with your health care provider. Document Released: 12/02/2015 Document Revised: 07/25/2016 Document Reviewed: 09/06/2015 Elsevier Interactive Patient Education  2017 [place] Prevention in the Home Falls can cause injuries. They can happen to people of all ages. There are many things you can do to make your home safe and to help prevent falls. What can I do on the outside of my home?  Regularly fix the edges of walkways and driveways and fix any cracks.  Remove anything that might make you trip as you walk through a door, such as a raised step or threshold.  Trim any bushes or trees on the path to your home.  Use bright outdoor lighting.  Clear any walking paths of anything that might make someone trip, such as rocks or tools.  Regularly check to see if handrails are loose or broken. Make sure that both sides of any steps have handrails.  Any raised decks and porches should have guardrails on the edges.  Have any leaves, snow, or ice cleared regularly.  Use sand or salt on walking paths during winter.  Clean up any spills in your garage right away. This includes oil or grease spills. What can I do in the bathroom?  Use night lights.  Install grab bars by the toilet and in the tub and shower. Do not use towel bars as grab bars.  Use non-skid mats or decals in the tub or shower.  If you need to sit down in the shower, use a plastic, non-slip stool.  Keep the floor dry. Clean up any water that spills on the floor as soon as it happens.  Remove soap buildup in the tub or shower regularly.  Attach bath mats securely with double-sided non-slip rug tape.  Do not have throw rugs and other things on the floor that can make you trip. What can I do  in the bedroom?  Use night lights.  Make sure that you have a light by your bed that is easy to reach.  Do not use any sheets or blankets that are too big for your bed. They should not hang down onto the floor.  Have a firm chair that has side arms. You can use this for support while you get dressed.  Do not have throw rugs and other things on the floor that can make you trip. What can I do in the kitchen?  Clean up any spills right away.  Avoid walking on wet floors.  Keep items that you use a lot in easy-to-reach places.  If you need to reach something above you, use a strong step stool that has a grab bar.  Keep electrical cords out of the way.  Do not use floor polish or wax that makes floors slippery. If you must use wax, use non-skid floor wax.  Do not have throw rugs and other things on the floor that can make you trip. What can I do with my stairs?  Do not leave any items on the stairs.  Make sure that there are handrails on both sides of the stairs and use them. Fix handrails that are broken or loose. Make sure that handrails are as long as the stairways.  Check any carpeting to make sure that it is firmly attached to the stairs. Fix any carpet that is loose or worn.  Avoid having throw rugs at the top or bottom of the stairs. If you do have throw rugs, attach them to the floor with carpet tape.  Make sure that you have a light switch at the top of the stairs and the bottom of the stairs. If you do not have them, ask someone to add them for you. What else can I do to help prevent falls?  Wear shoes that:  Do not have high heels.  Have rubber bottoms.  Are comfortable and fit you well.  Are closed at the toe. Do not wear sandals.  If you use a stepladder:  Make sure that it is fully opened. Do not climb a closed stepladder.  Make sure that both sides of the stepladder are locked into place.  Ask someone to hold it for you, if possible.  Clearly mark  and make sure that you can see:  Any grab bars or handrails.  First and last steps.  Where the edge of each step is.  Use tools that help you move around (mobility aids) if they are needed. These include:  Canes.  Walkers.  Scooters.  Crutches.  Turn on the lights when you go into a dark area. Replace any light bulbs as soon as they burn out.  Set up your furniture so you have a clear path. Avoid moving your furniture around.  If any of your floors are uneven, fix them.  If there are any pets around you, be aware of where they are.  Review your medicines with your doctor. Some medicines can make you feel dizzy. This can increase your chance of falling. Ask your doctor what other things that you can do to help prevent falls. This information is not intended to replace advice given to you by your health care provider. Make sure you discuss any questions you have with your health care provider. Document Released: 09/01/2009 Document Revised: 04/12/2016 Document Reviewed: 12/10/2014 Elsevier Interactive Patient Education  2017 [employer].

## 2017-06-27 ENCOUNTER — Non-Acute Institutional Stay (SKILLED_NURSING_FACILITY): Payer: Medicare Other | Admitting: Internal Medicine

## 2017-06-27 ENCOUNTER — Encounter: Payer: Self-pay | Admitting: Internal Medicine

## 2017-06-27 DIAGNOSIS — Z794 Long term (current) use of insulin: Secondary | ICD-10-CM

## 2017-06-27 DIAGNOSIS — N183 Chronic kidney disease, stage 3 unspecified: Secondary | ICD-10-CM

## 2017-06-27 DIAGNOSIS — E1169 Type 2 diabetes mellitus with other specified complication: Secondary | ICD-10-CM

## 2017-06-27 DIAGNOSIS — E785 Hyperlipidemia, unspecified: Secondary | ICD-10-CM

## 2017-06-27 DIAGNOSIS — I10 Essential (primary) hypertension: Secondary | ICD-10-CM | POA: Diagnosis not present

## 2017-06-27 DIAGNOSIS — I739 Peripheral vascular disease, unspecified: Secondary | ICD-10-CM

## 2017-06-27 DIAGNOSIS — F0150 Vascular dementia without behavioral disturbance: Secondary | ICD-10-CM

## 2017-06-27 DIAGNOSIS — E1122 Type 2 diabetes mellitus with diabetic chronic kidney disease: Secondary | ICD-10-CM | POA: Diagnosis not present

## 2017-06-27 DIAGNOSIS — I5022 Chronic systolic (congestive) heart failure: Secondary | ICD-10-CM | POA: Diagnosis not present

## 2017-06-27 NOTE — Progress Notes (Signed)
Patient ID: Travis Coleman, male   DOB: 03/02/1932, 83 y.o.   MRN: [id number]     DATE:  June 27, 2017  Location:   [facility] Room Number: 226 A Place of Service: SNF (31)   Extended Emergency Contact Information Primary Emergency Contact: Wendelyn Breslow [place] Home Phone: [phone] Mobile Phone: [phone] Relation: Sister  Advanced Directive information Does Patient Have a Medical Advance Directive?: Yes, Type of Advance Directive: Out of facility DNR (pink MOST or yellow form), Pre-existing out of facility DNR order (yellow form or pink MOST form): Pink MOST form placed in chart (order not valid for inpatient use), Does patient want to make changes to medical advance directive?: No - Patient declined  Chief Complaint  Patient presents with  . Medical Management of Chronic Issues    1 month follow up    HPI:  83 yo male long term resident seen today for f/u. He states How can I get out of here?". No other concerns. Appetite ok. Sleeps well. He is a poor historian due to dementia. Hx obtained from chart. AWV completed 05/23/17. CBGs 110-320s; occasionally >350  Dyslipidemia - stable on lipitor 20 mg daily. LDL 92   Anemia of chronic disease - stable on iron daily. Hgb is 11.4  Hypertension - stable on vasotec 20 mg daily;  hydralazine  50 mg every 8 hours. 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 Asymptomatic. Takes ASA 81 mg daily   Vascular dementia - unchanged on continue namzaric 28-10 mg daily. He takes depakote sprinkles 250 mg three times daily to stabilize mood. Albumin 3.1. Weight is stable and has actually gained 1 lb since last month.  DM - borderline controlled. A1c 7.9%  will continue novolog 10 units with meals with an extra 3 units  for cbg >=150; tradjenta 5 mg daily and lantus 8 units nightly. No low BS reactions. He takes ACEI and statin. He has neuropathy and takes gabapentin  CHF - EF 50-55% (11-04-15) -  stable off diuretic. Takes hydralazine 50 mg three times daily   PVD - takes ASA 81 mg daily; tylenol 650 mg every 6 hours and neurontin 200 mg nightly   CKD - stage 3. Cr 1.4   Past Medical History:  Diagnosis Date  . Anemia   . Bradycardia   . CAD (coronary artery disease) of bypass graft    Multivessel  . Cataract    bilateral  . CHF (congestive heart failure) (HCC)   . Chronic kidney disease    Stage 3  . Dementia    MMSE 18/30 Feb 2012 [facility]), with behavioral disturbances  . Diabetes mellitus   . Dysphagia   . Hearing deficit   . Hyperlipidemia   . Hypertension   . Neuropathy   . PVD (peripheral vascular disease) (HCC)   . Thrombocytopenia (HCC)     Past Surgical History:  Procedure Laterality Date  . CORONARY ARTERY BYPASS GRAFT    . PERIPHERAL VASCULAR CATHETERIZATION N/A 11/28/2016   Procedure: Abdominal Aortogram w/Lower Extremity;  Surgeon: Maeola Harman, MD;  Location[facility] INVASIVE CV LAB;  Service: Cardiovascular;  Laterality: N/A;    Patient Care Team: Florentina Jenny, MD as PCP - General (Family Medicine)  Social History   Social History  . Marital status: Single    Spouse name: N/A  . Number of children: N/A  . Years of education: N/A   Occupational History  . Not  on file.   Social History Main Topics  . Smoking status: Former Smoker    Quit date: 11/19/1948  . Smokeless tobacco: Never Used  . Alcohol use No  . Drug use: No  . Sexual activity: Not Currently   Other Topics Concern  . Not on file   Social History Narrative  . No narrative on file     reports that he quit smoking about 68 years ago. He has never used smokeless tobacco. He reports that he does not drink alcohol or use drugs.  Family History  Problem Relation Age of Onset  . Diabetes Mother   . Dementia Mother        alzheimer  . Dementia Father   . Stroke Father   . Heart disease Father   . Dementia Sister    Family Status  Relation Status  . Mother  (Not Specified)  . Father (Not Specified)  . Sister (Not Specified)    Immunization History  Administered Date(s) Administered  . Influenza,inj,Quad PF,36+ Mos 12/06/2013  . Influenza-Unspecified 12/28/2015, 08/26/2016  . PPD Test 08/10/2016, 08/17/2016  . Pneumococcal Polysaccharide-23 12/06/2013    No Known Allergies  Medications: Patient's Medications  New Prescriptions   No medications on file  Previous Medications   ACETAMINOPHEN (TYLENOL) 650 MG CR TABLET    Take 650 mg by mouth every 6 (six) hours.   ASPIRIN EC 81 MG TABLET    Take 81 mg by mouth daily.   ATORVASTATIN (LIPITOR) 20 MG TABLET    Take 20 mg by mouth at bedtime.   CHOLECALCIFEROL (VITAMIN D) 1000 UNITS TABLET    Take 2,000 Units by mouth daily.   DIVALPROEX (DEPAKOTE SPRINKLE) 125 MG CAPSULE    Take 250 mg by mouth 3 (three) times daily.    ENALAPRIL (VASOTEC) 20 MG TABLET    Take 20 mg by mouth daily.   EYELID CLEANSERS (OCUSOFT EYELID CLEANSING) PADS    Apply 1 application topically daily. Apply to bilateral eye lids one time daily   FERROUS SULFATE 325 (65 FE) MG TABLET    Take 325 mg by mouth daily with breakfast.   GABAPENTIN (NEURONTIN) 100 MG CAPSULE    Take 200 mg by mouth at bedtime.   HYDRALAZINE (APRESOLINE) 50 MG TABLET    Take 50 mg by mouth every 8 (eight) hours.   INSULIN ASPART (NOVOLOG) 100 UNIT/ML INJECTION    Inject as per sliding scale: if  0-59=0 Call MD ; 60-69 = 0 units, 70-150= 10; 151-450 = 13 units; 450 + = 0 unitsCall MD for CBG < 60 or > 450, subcutaneously 3 times daily after meals related to DM.   INSULIN GLARGINE (LANTUS) 100 UNIT/ML INJECTION    Inject 8 Units into the skin at bedtime.    LINAGLIPTIN (TRADJENTA) 5 MG TABS TABLET    Take 5 mg by mouth daily.   MEMANTINE HCL-DONEPEZIL HCL (NAMZARIC) 28-10 MG CP24    Take 1 capsule by mouth at bedtime.   Modified Medications   No medications on file  Discontinued Medications   PROMETHAZINE (PHENERGAN) 25 MG TABLET    Take 12.5 mg  by mouth every 6 (six) hours as needed for nausea or vomiting.   SKIN PROTECTANTS, MISC. (CALAZIME SKIN PROTECTANT) PSTE    Apply 1 application topically 2 (two) times daily. To buttocks for incontinence care    Review of Systems  Unable to perform ROS: Dementia (memory loss)    Vitals:   06/27/17 1030  Weight: 202 lb 12.8 oz (92 kg)  Height: 5\' 8"  (1.727 m)   Body mass index is 30.84 kg/m.  Physical Exam  Constitutional: He appears well-developed.  Frail appearing, sitting in w/c in NAD  HENT:  Mouth/Throat: Oropharynx is clear and moist.  MMM; no oral thrush  Eyes: Pupils are equal, round, and reactive to light. No scleral icterus.  Neck: Neck supple. Carotid bruit is not present. No thyromegaly present.  Cardiovascular: Normal rate, regular rhythm and intact distal pulses.  Exam reveals no gallop and no friction rub.   Murmur (1/6 SEM) heard. Trace LE edema b/l. No calf TTP  Pulmonary/Chest: Effort normal and breath sounds normal. He has no wheezes. He has no rales. He exhibits no tenderness.  Abdominal: Soft. Normal appearance and bowel sounds are normal. He exhibits no distension, no abdominal bruit, no pulsatile midline mass and no mass. There is no hepatomegaly. There is no tenderness. There is no rigidity, no rebound and no guarding. No hernia.  Musculoskeletal: He exhibits edema.  Lymphadenopathy:    He has no cervical adenopathy.  Neurological: He is alert.  Skin: Skin is warm and dry. Rash (facial seborrhea dermatitis, including beard) noted.  Psychiatric: He has a normal mood and affect. His behavior is normal. Thought content is delusional.     Labs reviewed: Abstract on 05/24/2017  Component Date Value Ref Range Status  . Hemoglobin 04/24/2017 11.4* 13.5 - 17.5 Final  . HCT 04/24/2017 36* 41 - 53 Final  . Neutrophils Absolute 04/24/2017 3   Final  . Platelets 04/24/2017 107* 150 - 399 Final  . WBC 04/24/2017 6.3   Final  . Glucose 04/24/2017 243   Final  .  BUN 04/24/2017 35* 4 - 21 Final  . Creatinine 04/24/2017 1.4* 0.6 - 1.3 Final  . Potassium 04/24/2017 4.6  3.4 - 5.3 Final  . Sodium 04/24/2017 145  137 - 147 Final  . Alkaline Phosphatase 04/24/2017 58  25 - 125 Final  . ALT 04/24/2017 8* 10 - 40 Final  . AST 04/24/2017 7* 14 - 40 Final  . Bilirubin, Total 04/24/2017 0.2   Final  Nursing Home on 05/21/2017  Component Date Value Ref Range Status  . Hemoglobin A1C 05/22/2017 7.9   Final    No results found.   Assessment/Plan   ICD-10-CM   1. Type 2 diabetes mellitus with stage 3 chronic kidney disease, with long-term current use of insulin (HCC) E11.22    N18.3    Z79.4   2. Vascular dementia without behavioral disturbance F01.50   3. Essential hypertension I10   4. Dyslipidemia associated with type 2 diabetes mellitus (HCC) E11.69    E78.5   5. Chronic systolic CHF (congestive heart failure) (HCC) I50.22   6. PVD (peripheral vascular disease) (HCC) I73.9     Check urine microalbumin/Creatinine ratio  Cont current meds as ordered  PT/OT/ST as indicated  F/u with specialists as scheduled  Will follow   Olina Melfi S. Ancil Linsey  [facility] [address] [place], [place] [address] [phone] Cell (Monday-Friday 8 AM - 5 PM) [phone] After 5 PM and follow prompts

## 2017-07-01 LAB — MICROALBUMIN, URINE

## 2017-07-10 LAB — HM DIABETES EYE EXAM

## 2017-07-30 ENCOUNTER — Encounter: Payer: Self-pay | Admitting: Adult Health

## 2017-07-30 ENCOUNTER — Non-Acute Institutional Stay (SKILLED_NURSING_FACILITY): Payer: Medicare Other | Admitting: Adult Health

## 2017-07-30 DIAGNOSIS — E1169 Type 2 diabetes mellitus with other specified complication: Secondary | ICD-10-CM

## 2017-07-30 DIAGNOSIS — N183 Chronic kidney disease, stage 3 unspecified: Secondary | ICD-10-CM

## 2017-07-30 DIAGNOSIS — I25709 Atherosclerosis of coronary artery bypass graft(s), unspecified, with unspecified angina pectoris: Secondary | ICD-10-CM | POA: Diagnosis not present

## 2017-07-30 DIAGNOSIS — F0150 Vascular dementia without behavioral disturbance: Secondary | ICD-10-CM

## 2017-07-30 DIAGNOSIS — E785 Hyperlipidemia, unspecified: Secondary | ICD-10-CM

## 2017-07-30 DIAGNOSIS — D631 Anemia in chronic kidney disease: Secondary | ICD-10-CM | POA: Diagnosis not present

## 2017-07-30 DIAGNOSIS — I209 Angina pectoris, unspecified: Secondary | ICD-10-CM

## 2017-07-30 NOTE — Progress Notes (Addendum)
Location:   [facility] Room Number: 226 A Place of Service:  SNF (31)   CODE STATUS: Full Code  No Known Allergies  Chief Complaint  Patient presents with  . Medical Management of Chronic Issues    dyslipidemia; anemia; cad; dementia     HPI:  He is a 83 year old long term Coleman of this facility being seen for the management of his chronic illnesses: dyslipidemia; anemia; cad and dementia. He is unable to fully participate in the hpi or ros. There are no reports of any behavior changes; appetite changes; or chest pain. There are no nursing concerns at this time.     Past Medical History:  Diagnosis Date  . Anemia   . Bradycardia   . CAD (coronary artery disease) of bypass graft    Multivessel  . Cataract    bilateral  . CHF (congestive heart failure) (HCC)   . Chronic kidney disease    Stage 3  . Dementia    MMSE 18/[place] Feb 2012 [facility]), with behavioral disturbances  . Diabetes mellitus   . Dysphagia   . Hearing deficit   . Hyperlipidemia   . Hypertension   . Neuropathy   . PVD (peripheral vascular disease) (HCC)   . Thrombocytopenia (HCC)     Past Surgical History:  Procedure Laterality Date  . CORONARY ARTERY BYPASS GRAFT    . PERIPHERAL VASCULAR CATHETERIZATION N/A 11/28/2016   Procedure: Abdominal Aortogram w/Lower Extremity;  Surgeon: Maeola Harman, MD;  Location: [facility] INVASIVE CV LAB;  Service: Cardiovascular;  Laterality: N/A;    Social History   Social History  . Marital status: Single    Spouse name: N/A  . Number of children: N/A  . Years of education: N/A   Occupational History  . Not on file.   Social History Main Topics  . Smoking status: Former Smoker    Quit date: 11/19/1948  . Smokeless tobacco: Never Used  . Alcohol use No  . Drug use: No  . Sexual activity: Not Currently   Other Topics Concern  . Not on file   Social History Narrative  . No narrative on file   Family History  Problem Relation Age of  Onset  . Diabetes Mother   . Dementia Mother        alzheimer  . Dementia Father   . Stroke Father   . Heart disease Father   . Dementia Sister       VITAL SIGNS BP (!) 182/58   Pulse (!) 58   Temp 97.8 F (36.6 C)   Resp 17   Ht  (1.727 m)   Wt 208 lb 1.6 oz (94.4 kg)   BMI 31.64 kg/m   Patient's Medications  New Prescriptions   No medications on file  Previous Medications   ACETAMINOPHEN (TYLENOL) 650 MG CR TABLET    Take 650 mg by mouth every 6 (six) hours.   ASPIRIN EC 81 MG TABLET    Take 81 mg by mouth daily.   ATORVASTATIN (LIPITOR) 20 MG TABLET    Take 20 mg by mouth at bedtime.   CHOLECALCIFEROL (VITAMIN D) 1000 UNITS TABLET    Take 2,000 Units by mouth daily.   DIVALPROEX (DEPAKOTE SPRINKLE) 125 MG CAPSULE    Take 250 mg by mouth 3 (three) times daily.    ENALAPRIL (VASOTEC) 20 MG TABLET    Take 20 mg by mouth daily.   EYELID CLEANSERS (OCUSOFT EYELID CLEANSING) PADS  Apply 1 application topically daily. Apply to bilateral eye lids one time daily   FERROUS SULFATE 325 (65 FE) MG TABLET    Take 325 mg by mouth daily with breakfast.   GABAPENTIN (NEURONTIN) 100 MG CAPSULE    Take 200 mg by mouth at bedtime.   HYDRALAZINE (APRESOLINE) 50 MG TABLET    Take 50 mg by mouth every 8 (eight) hours.   INSULIN ASPART (NOVOLOG) 100 UNIT/ML INJECTION    Inject as per sliding scale: if  0-59=0 Call MD ; 60-69 = 0 units, 70-150= 10; 151-450 = 13 units; 450 + = 0 unitsCall MD for CBG < 60 or > 450, subcutaneously 3 times daily after meals related to DM.   INSULIN GLARGINE (LANTUS) 100 UNIT/ML INJECTION    Inject 8 Units into the skin at bedtime.    LINAGLIPTIN (TRADJENTA) 5 MG TABS TABLET    Take 5 mg by mouth daily.   MEMANTINE HCL-DONEPEZIL HCL (NAMZARIC) 28-10 MG CP24    Take 1 capsule by mouth at bedtime.   Modified Medications   No medications on file  Discontinued Medications   No medications on file     SIGNIFICANT DIAGNOSTIC EXAMS   PREVIOUS   11-03-15:  chest x-ray; Shallow inspiration with atelectasis in the lung bases. Cardiac enlargement. No evidence of active pulmonary disease.   11-03-15: ct of abdomen and pelvis: 1. No definite acute abnormality seen within the abdomen or pelvis. 2. Trace fluid at the right lower quadrant is nonspecific. 3. Diffuse calcification along the abdominal aorta and its branches, including along the superior mesenteric artery, inferior mesenteric artery and at the proximal renal arteries bilaterally. 4. Mild bibasilar atelectasis noted. 5. Diffuse coronary artery calcifications seen. 6. Mild bilateral renal atrophy noted.  11-04-15: 2-d echo: Left ventricle: The cavity size was normal. There was mild concentric hypertrophy. Systolic function was normal. The estimated ejection fraction was in the range of 50% to 55%. Wall motion was normal; there were no regional wall motion abnormalities. Doppler parameters are consistent with abnormal left ventricular relaxation (grade 1 diastolic dysfunction). - Aortic valve: Valve mobility was restricted. Transvalvular velocity was increased, due to stenosis. There was mild stenosis. - Mitral valve: Calcified annulus. There was mild regurgitation. - Left atrium: The atrium was mildly dilated. - Right atrium: The atrium was mildly dilated.  02-04-16: right lower extremity doppler: negative for dvt  NO NEW EXAMS   LABS REVIEWED:   07-21-16: wbc 7.6; hgb 10.9; hct 34.4; mcv 92.6; plt 183; glucose 255; bun 38.6; creat 1.41; k+ 4.5; na++ 139 chol 126; ldl 49; trig 262; hdl 25 depakote 16  08-29-16: wbc 5.1; hgb 10.7; hct 34.2; mcv 92.6; plt 118  glucose 151; bun 51.6; creat 1.28; k+ 4.6; na++ 143  09-26-16: wbc 6.2; hgb 11.2; hct 36.6; mcv 99.0; plt 110; glucose 155; bun 29.0; creat 1.25; k+ 5.0; na++ 144; liver normal albumin 3.5 hgb a1c 7.8  01-25-17: chol 145; ldl 92; trig 199; hdl 27  hgb a1c 7.3 04-24-17: wbc 6.3; hgb 11.4; hct 36.2; mcv 97.2; plt 107; glucose 243; bun 35.1;  creat 1.40; k+ 4.6; na++ 145; ca 8.0; liver normal albumin 3.1; ammonia 48   TODAY:   05-22-17: urine micro-albumin: >51.3 07-01-17: hgb a1c 7.9; depakote 15    Review of Systems  Unable to perform ROS: Dementia (unable to answer questions )    Physical Exam  Constitutional: No distress.  Eyes: Conjunctivae are normal.  Neck: Neck supple. No JVD present.  No thyromegaly present.  Cardiovascular: Normal rate, regular rhythm and intact distal pulses.   Respiratory: Effort normal and breath sounds normal. No respiratory distress. He has no wheezes.  GI: Soft. Bowel sounds are normal. He exhibits no distension. There is no tenderness.  Musculoskeletal: He exhibits edema.  Trace bilateral edema Able to move all extremities   Lymphadenopathy:    He has no cervical adenopathy.  Neurological: He is alert.  Skin: Skin is warm and dry. He is not diaphoretic.  Psychiatric: He has a normal mood and affect.    ASSESSMENT/ PLAN:  TODAY:  1. Dyslipidemia:stable will continue lipitor 20 mg daily ldl is 92   2. Anemia of chronic disease: hgb is 11.4;  Stable will continue iron daily   3. Hypertension:  B/p 144/85 (recheck)   Has calcification in bilateral renal arteries stable  will continue vasotec  20 mg daily  hydralazine  50 mg every 8 hours.   4. CAD: status post cabg: stable  no complaint of chest pain present; will continue asa 81 mg daily   5. Vascular dementia: no change in status; his current weight is 201 pounds; will continue namzaric 28-10 mg daily   will continue depakote sprinkles 250 mg three times daily to stabilize mood  PREVIOUS    6. Diabetes: hgb a1c 7.9 (previous 7.3)  will continue novolog 10 units with meals with an extra 3 units  for cbg >=150 tradjenta 5 mg daily and lantus 8 units nightly   7. CHF: EF 50-55% (11-04-15) he is presently stable will  is presently not on diuretic continue hydralazine 50 mg  three times daily   8. PVD: will continue asa 81 mg daily;  tylenol 650 mg every 6 hours for pain management takes neurontin 200 mg nightly   9. CDK stage III: bun 35.1; creat 1.40; will monitor    MD is aware of Coleman's narcotic use and is in agreement with current plan of care. We will attempt to wean Coleman as apropriate     Synthia Innocenteborah Green NP [facility]  Contact [phone] Monday through Friday 8am- 5pm  After hours call [phone]

## 2017-07-31 ENCOUNTER — Encounter: Payer: Self-pay | Admitting: Adult Health

## 2017-08-15 ENCOUNTER — Encounter: Payer: Self-pay | Admitting: Adult Health

## 2017-08-15 ENCOUNTER — Non-Acute Institutional Stay (SKILLED_NURSING_FACILITY): Payer: Medicare Other | Admitting: Adult Health

## 2017-08-15 DIAGNOSIS — I110 Hypertensive heart disease with heart failure: Secondary | ICD-10-CM

## 2017-08-15 DIAGNOSIS — J189 Pneumonia, unspecified organism: Secondary | ICD-10-CM

## 2017-08-15 DIAGNOSIS — I5022 Chronic systolic (congestive) heart failure: Secondary | ICD-10-CM | POA: Diagnosis not present

## 2017-08-15 NOTE — Progress Notes (Signed)
Location:   [facility] Room Number: 226 A Place of Service:  SNF (31)   CODE STATUS: Full Code  No Known Allergies  Chief Complaint  Patient presents with  . Acute Visit    cough and congestion    HPI:  Staff reports today that he has coughing and congestion present; with his face red; eyes with drainage present. There are no report of fever present. The staff reports that his appetite is worse.   Past Medical History:  Diagnosis Date  . Anemia   . Bradycardia   . CAD (coronary artery disease) of bypass graft    Multivessel  . Cataract    bilateral  . CHF (congestive heart failure) (HCC)   . Chronic kidney disease    Stage 3  . Dementia    MMSE 18/30 Feb 2012 [facility]), with behavioral disturbances  . Diabetes mellitus   . Dysphagia   . Hearing deficit   . Hyperlipidemia   . Hypertension   . Neuropathy   . PVD (peripheral vascular disease) (HCC)   . Thrombocytopenia (HCC)     Past Surgical History:  Procedure Laterality Date  . CORONARY ARTERY BYPASS GRAFT    . PERIPHERAL VASCULAR CATHETERIZATION N/A 11/28/2016   Procedure: Abdominal Aortogram w/Lower Extremity;  Surgeon: Maeola Harman, MD;  Location: [facility] INVASIVE CV LAB;  Service: Cardiovascular;  Laterality: N/A;    Social History   Social History  . Marital status: Single    Spouse name: N/A  . Number of children: N/A  . Years of education: N/A   Occupational History  . Not on file.   Social History Main Topics  . Smoking status: Former Smoker    Quit date: 11/19/1948  . Smokeless tobacco: Never Used  . Alcohol use No  . Drug use: No  . Sexual activity: Not Currently   Other Topics Concern  . Not on file   Social History Narrative  . No narrative on file   Family History  Problem Relation Age of Onset  . Diabetes Mother   . Dementia Mother        alzheimer  . Dementia Father   . Stroke Father   . Heart disease Father   . Dementia Sister       VITAL  SIGNS Ht  (1.727 m)   Wt 209 lb 6.4 oz (95 kg)   BMI 31.84 kg/m   Patient's Medications  New Prescriptions   No medications on file  Previous Medications   ACETAMINOPHEN (TYLENOL) 650 MG CR TABLET    Take 650 mg by mouth every 6 (six) hours.   ASPIRIN EC 81 MG TABLET    Take 81 mg by mouth daily.   ATORVASTATIN (LIPITOR) 20 MG TABLET    Take 20 mg by mouth at bedtime.   CHOLECALCIFEROL (VITAMIN D) 1000 UNITS TABLET    Take 2,000 Units by mouth daily.   DIVALPROEX (DEPAKOTE SPRINKLE) 125 MG CAPSULE    Take 250 mg by mouth 3 (three) times daily.    ENALAPRIL (VASOTEC) 20 MG TABLET    Take 20 mg by mouth daily.   FERROUS SULFATE 325 (65 FE) MG TABLET    Take 325 mg by mouth daily with breakfast.   GABAPENTIN (NEURONTIN) 100 MG CAPSULE    Take 200 mg by mouth at bedtime.   HYDRALAZINE (APRESOLINE) 50 MG TABLET    Take 50 mg by mouth every 8 (eight) hours.   INSULIN ASPART (NOVOLOG) 100  UNIT/ML INJECTION    Inject as per sliding scale: if  0-59=0 Call MD ; 60-69 = 0 units, 70-150= 10; 151-450 = 13 units; 450 + = 0 unitsCall MD for CBG < 60 or > 450, subcutaneously 3 times daily after meals related to DM.   INSULIN GLARGINE (LANTUS) 100 UNIT/ML INJECTION    Inject 8 Units into the skin at bedtime.    LINAGLIPTIN (TRADJENTA) 5 MG TABS TABLET    Take 5 mg by mouth daily.   MEMANTINE HCL-DONEPEZIL HCL (NAMZARIC) 28-10 MG CP24    Take 1 capsule by mouth at bedtime.   Modified Medications   No medications on file  Discontinued Medications   EYELID CLEANSERS (OCUSOFT EYELID CLEANSING) PADS    Apply 1 application topically daily. Apply to bilateral eye lids one time daily     SIGNIFICANT DIAGNOSTIC EXAMS  PREVIOUS   11-03-15: chest x-ray; Shallow inspiration with atelectasis in the lung bases. Cardiac enlargement. No evidence of active pulmonary disease.   11-03-15: ct of abdomen and pelvis: 1. No definite acute abnormality seen within the abdomen or pelvis. 2. Trace fluid at the right  lower quadrant is nonspecific. 3. Diffuse calcification along the abdominal aorta and its branches, including along the superior mesenteric artery, inferior mesenteric artery and at the proximal renal arteries bilaterally. 4. Mild bibasilar atelectasis noted. 5. Diffuse coronary artery calcifications seen. 6. Mild bilateral renal atrophy noted.  11-04-15: 2-d echo: Left ventricle: The cavity size was normal. There was mild concentric hypertrophy. Systolic function was normal. The estimated ejection fraction was in the range of 50% to 55%. Wall motion was normal; there were no regional wall motion abnormalities. Doppler parameters are consistent with abnormal left ventricular relaxation (grade 1 diastolic dysfunction). - Aortic valve: Valve mobility was restricted. Transvalvular velocity was increased, due to stenosis. There was mild stenosis. - Mitral valve: Calcified annulus. There was mild regurgitation. - Left atrium: The atrium was mildly dilated. - Right atrium: The atrium was mildly dilated.  02-04-16: right lower extremity doppler: negative for dvt  NO NEW EXAMS   LABS REVIEWED:   07-21-16: wbc 7.6; hgb 10.9; hct 34.4; mcv 92.6; plt 183; glucose 255; bun 38.6; creat 1.41; k+ 4.5; na++ 139 chol 126; ldl 49; trig 262; hdl 25 depakote 16  08-29-16: wbc 5.1; hgb 10.7; hct 34.2; mcv 92.6; plt 118  glucose 151; bun 51.6; creat 1.28; k+ 4.6; na++ 143  09-26-16: wbc 6.2; hgb 11.2; hct 36.6; mcv 99.0; plt 110; glucose 155; bun 29.0; creat 1.25; k+ 5.0; na++ 144; liver normal albumin 3.5 hgb a1c 7.8  01-25-17: chol 145; ldl 92; trig 199; hdl 27  hgb a1c 7.3 04-24-17: wbc 6.3; hgb 11.4; hct 36.2; mcv 97.2; plt 107; glucose 243; bun 35.1; creat 1.40; k+ 4.6; na++ 145; ca 8.0; liver normal albumin 3.1; ammonia 48  05-22-17: urine micro-albumin: >51.3 07-01-17: hgb a1c 7.9; depakote 15  NO NEW LABS     Review of Systems  Unable to perform ROS: Dementia (unable to answer questions )   Physical Exam   Constitutional: He appears distressed.  Eyes:  Sclera are red has yellow drainage   Neck: Neck supple. No thyromegaly present.  Pulmonary/Chest: No respiratory distress. He has wheezes. He has rales.  Abdominal: Soft. Bowel sounds are normal. He exhibits no distension. There is no tenderness.  Musculoskeletal: He exhibits edema.  Has trace amount lower extremity edema Is able to move all extremities   Lymphadenopathy:    He has  no cervical adenopathy.  Neurological: He is alert.  Skin: Skin is warm and dry. He is not diaphoretic.   ASSESSMENT/ PLAN:  TODAY:  1. Pneumonia: worse: will begin levaquin 750 mg daily with florastor; will use duoneb very 6 hours for 2 weeks then every 6 hours as needed  2. Hypertension: without change will add clonidine 0.1 mg every 6 hours as needed for ab/p >170  3. Conjunctivitis; will begin bleph 10 10% 2 drops to both eyes four times daily for 10 days.    Will get stat chest x-ray    MD is aware of resident's narcotic use and is in agreement with current plan of care. We will attempt to wean resident as apropriate     Synthia Innocent NP [facility]  Contact [phone] Monday through Friday 8am- 5pm  After hours call [phone]

## 2017-08-16 ENCOUNTER — Non-Acute Institutional Stay (SKILLED_NURSING_FACILITY): Payer: Medicare Other | Admitting: Adult Health

## 2017-08-16 ENCOUNTER — Encounter: Payer: Self-pay | Admitting: Adult Health

## 2017-08-16 DIAGNOSIS — I5022 Chronic systolic (congestive) heart failure: Secondary | ICD-10-CM | POA: Diagnosis not present

## 2017-08-16 DIAGNOSIS — J189 Pneumonia, unspecified organism: Secondary | ICD-10-CM

## 2017-08-16 NOTE — Progress Notes (Signed)
Location:   [facility] Room Number: 226 A Place of Service:  SNF (31)   CODE STATUS: Full Code  No Known Allergies  Chief Complaint  Patient presents with  . Acute Visit    change in status    HPI:  He is continuing to have a cough with congestion present. He is presently on levaquin and duonebs for his pneumonia. The chest x-ray on 08-15-17 demonstrates mild congestive heart failure. There are no reports of fever; he does have a decreased appetite he is less responsive. He cannot participate in the hpi or ros.   Past Medical History:  Diagnosis Date  . Anemia   . Bradycardia   . CAD (coronary artery disease) of bypass graft    Multivessel  . Cataract    bilateral  . CHF (congestive heart failure) (HCC)   . Chronic kidney disease    Stage 3  . Dementia    MMSE 18/30 Feb 2012 [facility]), with behavioral disturbances  . Diabetes mellitus   . Dysphagia   . Hearing deficit   . Hyperlipidemia   . Hypertension   . Neuropathy   . PVD (peripheral vascular disease) (HCC)   . Thrombocytopenia (HCC)     Past Surgical History:  Procedure Laterality Date  . CORONARY ARTERY BYPASS GRAFT    . PERIPHERAL VASCULAR CATHETERIZATION N/A 11/28/2016   Procedure: Abdominal Aortogram w/Lower Extremity;  Surgeon: Maeola Harman, MD;  Location: [facility] INVASIVE CV LAB;  Service: Cardiovascular;  Laterality: N/A;    Social History   Social History  . Marital status: Single    Spouse name: N/A  . Number of children: N/A  . Years of education: N/A   Occupational History  . Not on file.   Social History Main Topics  . Smoking status: Former Smoker    Quit date: 11/19/1948  . Smokeless tobacco: Never Used  . Alcohol use No  . Drug use: No  . Sexual activity: Not Currently   Other Topics Concern  . Not on file   Social History Narrative  . No narrative on file   Family History  Problem Relation Age of Onset  . Diabetes Mother   . Dementia Mother    alzheimer  . Dementia Father   . Stroke Father   . Heart disease Father   . Dementia Sister       VITAL SIGNS BP (!) 143/51   Pulse 63   Temp (!) 94.6 F (34.8 C)   Resp (!) 24   Ht  (1.727 m)   Wt 209 lb 6.4 oz (95 kg)   SpO2 97%   BMI 31.84 kg/m    Patient's Medications  New Prescriptions   No medications on file  Previous Medications   ACETAMINOPHEN (TYLENOL) 650 MG CR TABLET    Take 650 mg by mouth every 6 (six) hours.   ASPIRIN EC 81 MG TABLET    Take 81 mg by mouth daily.   ATORVASTATIN (LIPITOR) 20 MG TABLET    Take 20 mg by mouth at bedtime.   CHOLECALCIFEROL (VITAMIN D) 1000 UNITS TABLET    Take 2,000 Units by mouth daily.   DIVALPROEX (DEPAKOTE SPRINKLE) 125 MG CAPSULE    Take 250 mg by mouth 3 (three) times daily.    ENALAPRIL (VASOTEC) 20 MG TABLET    Take 20 mg by mouth daily.   EYELID CLEANSERS (OCUSOFT EYELID CLEANSING EX)    Apply to bilateral eye lids topically one time  daily for Bilateral Marginal Blepharitis   FERROUS SULFATE 325 (65 FE) MG TABLET    Take 325 mg by mouth daily with breakfast.   GABAPENTIN (NEURONTIN) 100 MG CAPSULE    Take 200 mg by mouth at bedtime.   HYDRALAZINE (APRESOLINE) 50 MG TABLET    Take 50 mg by mouth every 8 (eight) hours.   INSULIN ASPART (NOVOLOG) 100 UNIT/ML INJECTION    Inject as per sliding scale: if  0-59=0 Call MD ; 60-69 = 0 units, 70-150= 10; 151-450 = 13 units; 450 + = 0 unitsCall MD for CBG < 60 or > 450, subcutaneously 3 times daily after meals related to DM.   INSULIN GLARGINE (LANTUS) 100 UNIT/ML INJECTION    Inject 8 Units into the skin at bedtime.    IPRATROPIUM-ALBUTEROL (DUONEB) 0.5-2.5 (3) MG/3ML SOLN    Take 3 mLs by nebulization every 6 (six) hours as needed.   LEVOFLOXACIN (LEVAQUIN) 750 MG TABLET    Take 750 mg by mouth daily.   LINAGLIPTIN (TRADJENTA) 5 MG TABS TABLET    Take 5 mg by mouth daily.   MEMANTINE HCL-DONEPEZIL HCL (NAMZARIC) 28-10 MG CP24    Take 1 capsule by mouth at bedtime.     SACCHAROMYCES BOULARDII (FLORASTOR) 250 MG CAPSULE    Take 250 mg by mouth 2 (two) times daily.   SULFACETAMIDE (BLEPH-10) 10 % OPHTHALMIC SOLUTION    Place 1 drop into both eyes 4 (four) times daily.  Modified Medications   No medications on file  Discontinued Medications   No medications on file     SIGNIFICANT DIAGNOSTIC EXAMS   PREVIOUS   11-03-15: chest x-ray; Shallow inspiration with atelectasis in the lung bases. Cardiac enlargement. No evidence of active pulmonary disease.   11-03-15: ct of abdomen and pelvis: 1. No definite acute abnormality seen within the abdomen or pelvis. 2. Trace fluid at the right lower quadrant is nonspecific. 3. Diffuse calcification along the abdominal aorta and its branches, including along the superior mesenteric artery, inferior mesenteric artery and at the proximal renal arteries bilaterally. 4. Mild bibasilar atelectasis noted. 5. Diffuse coronary artery calcifications seen. 6. Mild bilateral renal atrophy noted.  11-04-15: 2-d echo: Left ventricle: The cavity size was normal. There was mild concentric hypertrophy. Systolic function was normal. The estimated ejection fraction was in the range of 50% to 55%. Wall motion was normal; there were no regional wall motion abnormalities. Doppler parameters are consistent with abnormal left ventricular relaxation (grade 1 diastolic dysfunction). - Aortic valve: Valve mobility was restricted. Transvalvular velocity was increased, due to stenosis. There was mild stenosis. - Mitral valve: Calcified annulus. There was mild regurgitation. - Left atrium: The atrium was mildly dilated. - Right atrium: The atrium was mildly dilated.  02-04-16: right lower extremity doppler: negative for dvt  TODAY:   08-15-17: chest x-ray: mild chf   LABS REVIEWED:   07-21-16: wbc 7.6; hgb 10.9; hct 34.4; mcv 92.6; plt 183; glucose 255; bun 38.6; creat 1.41; k+ 4.5; na++ 139 chol 126; ldl 49; trig 262; hdl 25 depakote 16    08-29-16: wbc 5.1; hgb 10.7; hct 34.2; mcv 92.6; plt 118  glucose 151; bun 51.6; creat 1.28; k+ 4.6; na++ 143  09-26-16: wbc 6.2; hgb 11.2; hct 36.6; mcv 99.0; plt 110; glucose 155; bun 29.0; creat 1.25; k+ 5.0; na++ 144; liver normal albumin 3.5 hgb a1c 7.8  01-25-17: chol 145; ldl 92; trig 199; hdl 27  hgb a1c 7.3 04-24-17: wbc 6.3; hgb 11.4;  hct 36.2; mcv 97.2; plt 107; glucose 243; bun 35.1; creat 1.40; k+ 4.6; na++ 145; ca 8.0; liver normal albumin 3.1; ammonia 48  05-22-17: urine micro-albumin: >51.3 07-01-17: hgb a1c 7.9; depakote 15 urine micro-albumin 88   NO NEW LABS     Review of Systems  Unable to perform ROS: Dementia (confused)    Physical Exam  Constitutional: He appears distressed.  Eyes: Conjunctivae are normal.  Neck: Neck supple.  Cardiovascular: Normal rate, regular rhythm, normal heart sounds and intact distal pulses.   Pulmonary/Chest: He is in respiratory distress. He has wheezes.  Abdominal: Soft. Bowel sounds are normal. He exhibits no distension.  Musculoskeletal: He exhibits edema.  Trace lower extremity edema Able to move all extremities   Lymphadenopathy:    He has no cervical adenopathy.  Neurological: He is alert.  Skin: Skin is warm and dry. He is not diaphoretic.     ASSESSMENT/ PLAN:  TODAY:  1. Chronic systolic heart failure 2. Pneumonia Will continue his current treatment  Will give him lasix 30 mg IM  Will insert foley for output monitoring   MD is aware of resident's narcotic use and is in agreement with current plan of care. We will attempt to wean resident as apropriate   Synthia Innocent NP [facility]  Contact [phone] Monday through Friday 8am- 5pm  After hours call [phone]

## 2017-08-17 ENCOUNTER — Encounter (HOSPITAL_COMMUNITY): Payer: Self-pay | Admitting: *Deleted

## 2017-08-17 ENCOUNTER — Emergency Department (HOSPITAL_COMMUNITY): Payer: Medicare Other

## 2017-08-17 ENCOUNTER — Inpatient Hospital Stay (HOSPITAL_COMMUNITY)
Admission: EM | Admit: 2017-08-17 | Discharge: 2017-08-27 | DRG: 871 | Disposition: A | Discharge status: Skilled Nursing Facility | Payer: Medicare Other | Attending: Internal Medicine | Admitting: Internal Medicine

## 2017-08-17 DIAGNOSIS — I2510 Atherosclerotic heart disease of native coronary artery without angina pectoris: Secondary | ICD-10-CM | POA: Diagnosis present

## 2017-08-17 DIAGNOSIS — I5033 Acute on chronic diastolic (congestive) heart failure: Secondary | ICD-10-CM | POA: Diagnosis present

## 2017-08-17 DIAGNOSIS — I25709 Atherosclerosis of coronary artery bypass graft(s), unspecified, with unspecified angina pectoris: Secondary | ICD-10-CM | POA: Diagnosis not present

## 2017-08-17 DIAGNOSIS — E1122 Type 2 diabetes mellitus with diabetic chronic kidney disease: Secondary | ICD-10-CM | POA: Diagnosis present

## 2017-08-17 DIAGNOSIS — E1151 Type 2 diabetes mellitus with diabetic peripheral angiopathy without gangrene: Secondary | ICD-10-CM | POA: Diagnosis present

## 2017-08-17 DIAGNOSIS — Z951 Presence of aortocoronary bypass graft: Secondary | ICD-10-CM

## 2017-08-17 DIAGNOSIS — F209 Schizophrenia, unspecified: Secondary | ICD-10-CM | POA: Diagnosis present

## 2017-08-17 DIAGNOSIS — D631 Anemia in chronic kidney disease: Secondary | ICD-10-CM | POA: Diagnosis present

## 2017-08-17 DIAGNOSIS — B9710 Unspecified enterovirus as the cause of diseases classified elsewhere: Secondary | ICD-10-CM | POA: Diagnosis present

## 2017-08-17 DIAGNOSIS — Z7189 Other specified counseling: Secondary | ICD-10-CM

## 2017-08-17 DIAGNOSIS — N179 Acute kidney failure, unspecified: Secondary | ICD-10-CM | POA: Diagnosis present

## 2017-08-17 DIAGNOSIS — A4189 Other specified sepsis: Secondary | ICD-10-CM | POA: Diagnosis present

## 2017-08-17 DIAGNOSIS — I25118 Atherosclerotic heart disease of native coronary artery with other forms of angina pectoris: Secondary | ICD-10-CM | POA: Diagnosis present

## 2017-08-17 DIAGNOSIS — Z993 Dependence on wheelchair: Secondary | ICD-10-CM

## 2017-08-17 DIAGNOSIS — H9190 Unspecified hearing loss, unspecified ear: Secondary | ICD-10-CM | POA: Diagnosis present

## 2017-08-17 DIAGNOSIS — T501X5A Adverse effect of loop [high-ceiling] diuretics, initial encounter: Secondary | ICD-10-CM | POA: Diagnosis not present

## 2017-08-17 DIAGNOSIS — J9621 Acute and chronic respiratory failure with hypoxia: Secondary | ICD-10-CM

## 2017-08-17 DIAGNOSIS — I5022 Chronic systolic (congestive) heart failure: Secondary | ICD-10-CM | POA: Diagnosis not present

## 2017-08-17 DIAGNOSIS — Y95 Nosocomial condition: Secondary | ICD-10-CM | POA: Diagnosis present

## 2017-08-17 DIAGNOSIS — E785 Hyperlipidemia, unspecified: Secondary | ICD-10-CM | POA: Diagnosis present

## 2017-08-17 DIAGNOSIS — R0602 Shortness of breath: Secondary | ICD-10-CM | POA: Diagnosis present

## 2017-08-17 DIAGNOSIS — R0600 Dyspnea, unspecified: Secondary | ICD-10-CM | POA: Diagnosis not present

## 2017-08-17 DIAGNOSIS — A419 Sepsis, unspecified organism: Secondary | ICD-10-CM

## 2017-08-17 DIAGNOSIS — R944 Abnormal results of kidney function studies: Secondary | ICD-10-CM | POA: Diagnosis not present

## 2017-08-17 DIAGNOSIS — J209 Acute bronchitis, unspecified: Secondary | ICD-10-CM | POA: Diagnosis present

## 2017-08-17 DIAGNOSIS — N183 Chronic kidney disease, stage 3 unspecified: Secondary | ICD-10-CM | POA: Diagnosis present

## 2017-08-17 DIAGNOSIS — J9600 Acute respiratory failure, unspecified whether with hypoxia or hypercapnia: Secondary | ICD-10-CM | POA: Diagnosis present

## 2017-08-17 DIAGNOSIS — D72829 Elevated white blood cell count, unspecified: Secondary | ICD-10-CM

## 2017-08-17 DIAGNOSIS — R1310 Dysphagia, unspecified: Secondary | ICD-10-CM | POA: Diagnosis present

## 2017-08-17 DIAGNOSIS — R6520 Severe sepsis without septic shock: Secondary | ICD-10-CM | POA: Diagnosis not present

## 2017-08-17 DIAGNOSIS — IMO0002 Reserved for concepts with insufficient information to code with codable children: Secondary | ICD-10-CM | POA: Diagnosis present

## 2017-08-17 DIAGNOSIS — Z79899 Other long term (current) drug therapy: Secondary | ICD-10-CM

## 2017-08-17 DIAGNOSIS — T380X5A Adverse effect of glucocorticoids and synthetic analogues, initial encounter: Secondary | ICD-10-CM | POA: Diagnosis present

## 2017-08-17 DIAGNOSIS — E1121 Type 2 diabetes mellitus with diabetic nephropathy: Secondary | ICD-10-CM | POA: Diagnosis present

## 2017-08-17 DIAGNOSIS — E1165 Type 2 diabetes mellitus with hyperglycemia: Secondary | ICD-10-CM | POA: Diagnosis present

## 2017-08-17 DIAGNOSIS — I130 Hypertensive heart and chronic kidney disease with heart failure and stage 1 through stage 4 chronic kidney disease, or unspecified chronic kidney disease: Secondary | ICD-10-CM | POA: Diagnosis present

## 2017-08-17 DIAGNOSIS — Z7982 Long term (current) use of aspirin: Secondary | ICD-10-CM

## 2017-08-17 DIAGNOSIS — H269 Unspecified cataract: Secondary | ICD-10-CM | POA: Diagnosis present

## 2017-08-17 DIAGNOSIS — E1142 Type 2 diabetes mellitus with diabetic polyneuropathy: Secondary | ICD-10-CM | POA: Diagnosis present

## 2017-08-17 DIAGNOSIS — I252 Old myocardial infarction: Secondary | ICD-10-CM

## 2017-08-17 DIAGNOSIS — Z87891 Personal history of nicotine dependence: Secondary | ICD-10-CM

## 2017-08-17 DIAGNOSIS — J189 Pneumonia, unspecified organism: Secondary | ICD-10-CM | POA: Diagnosis not present

## 2017-08-17 DIAGNOSIS — J9601 Acute respiratory failure with hypoxia: Secondary | ICD-10-CM | POA: Diagnosis not present

## 2017-08-17 DIAGNOSIS — Z515 Encounter for palliative care: Secondary | ICD-10-CM

## 2017-08-17 DIAGNOSIS — R0603 Acute respiratory distress: Secondary | ICD-10-CM | POA: Diagnosis not present

## 2017-08-17 DIAGNOSIS — I214 Non-ST elevation (NSTEMI) myocardial infarction: Secondary | ICD-10-CM | POA: Diagnosis present

## 2017-08-17 DIAGNOSIS — E1169 Type 2 diabetes mellitus with other specified complication: Secondary | ICD-10-CM | POA: Diagnosis present

## 2017-08-17 DIAGNOSIS — Z823 Family history of stroke: Secondary | ICD-10-CM

## 2017-08-17 DIAGNOSIS — F0151 Vascular dementia with behavioral disturbance: Secondary | ICD-10-CM | POA: Diagnosis present

## 2017-08-17 DIAGNOSIS — B9789 Other viral agents as the cause of diseases classified elsewhere: Secondary | ICD-10-CM | POA: Diagnosis present

## 2017-08-17 DIAGNOSIS — Z833 Family history of diabetes mellitus: Secondary | ICD-10-CM

## 2017-08-17 DIAGNOSIS — Z794 Long term (current) use of insulin: Secondary | ICD-10-CM

## 2017-08-17 DIAGNOSIS — Z820 Family history of epilepsy and other diseases of the nervous system: Secondary | ICD-10-CM

## 2017-08-17 DIAGNOSIS — Z8249 Family history of ischemic heart disease and other diseases of the circulatory system: Secondary | ICD-10-CM

## 2017-08-17 LAB — TROPONIN I
TROPONIN I: 3.1 ng/mL — AB (ref <0.03)
TROPONIN I: 4.24 ng/mL — AB (ref <0.03)

## 2017-08-17 LAB — I-STAT ARTERIAL BLOOD GAS, ED
Acid-base deficit: 1 mmol/L (ref 0.0–2.0)
BICARBONATE: 23.3 mmol/L (ref 20.0–28.0)
O2 Saturation: 100 %
PH ART: 7.392 (ref 7.350–7.450)
TCO2: 24 mmol/L (ref 22–32)
pCO2 arterial: 38.3 mmHg (ref 32.0–48.0)
pO2, Arterial: 315 mmHg — ABNORMAL HIGH (ref 83.0–108.0)

## 2017-08-17 LAB — PROTIME-INR
INR: 1.16
Prothrombin Time: 14.7 seconds (ref 11.4–15.2)

## 2017-08-17 LAB — URINALYSIS, ROUTINE W REFLEX MICROSCOPIC
Bilirubin Urine: NEGATIVE
Glucose, UA: NEGATIVE mg/dL
Hgb urine dipstick: NEGATIVE
KETONES UR: NEGATIVE mg/dL
NITRITE: NEGATIVE
PH: 5 (ref 5.0–8.0)
Protein, ur: >=300 mg/dL — AB
SPECIFIC GRAVITY, URINE: 1.017 (ref 1.005–1.030)

## 2017-08-17 LAB — HEMOGLOBIN A1C
Hgb A1c MFr Bld: 7.2 % — ABNORMAL HIGH (ref 4.8–5.6)
Mean Plasma Glucose: 159.94 mg/dL

## 2017-08-17 LAB — BASIC METABOLIC PANEL
ANION GAP: 12 (ref 5–15)
BUN: 48 mg/dL — ABNORMAL HIGH (ref 6–20)
CHLORIDE: 108 mmol/L (ref 101–111)
CO2: 18 mmol/L — ABNORMAL LOW (ref 22–32)
Calcium: 7.9 mg/dL — ABNORMAL LOW (ref 8.9–10.3)
Creatinine, Ser: 2.37 mg/dL — ABNORMAL HIGH (ref 0.61–1.24)
GFR calc Af Amer: 28 mL/min — ABNORMAL LOW (ref >60)
GFR, EST NON AFRICAN AMERICAN: 24 mL/min — AB (ref >60)
GLUCOSE: 293 mg/dL — AB (ref 65–99)
POTASSIUM: 4.8 mmol/L (ref 3.5–5.1)
Sodium: 138 mmol/L (ref 135–145)

## 2017-08-17 LAB — CBC
HEMATOCRIT: 32.6 % — AB (ref 39.0–52.0)
Hemoglobin: 10.2 g/dL — ABNORMAL LOW (ref 13.0–17.0)
MCH: 30.6 pg (ref 26.0–34.0)
MCHC: 31.3 g/dL (ref 30.0–36.0)
MCV: 97.9 fL (ref 78.0–100.0)
PLATELETS: 130 10*3/uL — AB (ref 150–400)
RBC: 3.33 MIL/uL — ABNORMAL LOW (ref 4.22–5.81)
RDW: 14.1 % (ref 11.5–15.5)
WBC: 5.4 10*3/uL (ref 4.0–10.5)

## 2017-08-17 LAB — BRAIN NATRIURETIC PEPTIDE: B Natriuretic Peptide: 314.7 pg/mL — ABNORMAL HIGH (ref 0.0–100.0)

## 2017-08-17 LAB — I-STAT TROPONIN, ED: Troponin i, poc: 2.66 ng/mL (ref 0.00–0.08)

## 2017-08-17 LAB — INFLUENZA PANEL BY PCR (TYPE A & B)
INFLBPCR: NEGATIVE
Influenza A By PCR: NEGATIVE

## 2017-08-17 LAB — APTT: APTT: 38 s — AB (ref 24–36)

## 2017-08-17 LAB — LACTIC ACID, PLASMA
LACTIC ACID, VENOUS: 1.6 mmol/L (ref 0.5–1.9)
Lactic Acid, Venous: 1.8 mmol/L (ref 0.5–1.9)

## 2017-08-17 LAB — I-STAT CG4 LACTIC ACID, ED: LACTIC ACID, VENOUS: 2.1 mmol/L — AB (ref 0.5–1.9)

## 2017-08-17 LAB — GLUCOSE, CAPILLARY: Glucose-Capillary: 330 mg/dL — ABNORMAL HIGH (ref 65–99)

## 2017-08-17 LAB — PROCALCITONIN: PROCALCITONIN: 1.86 ng/mL

## 2017-08-17 LAB — MRSA PCR SCREENING: MRSA by PCR: NEGATIVE

## 2017-08-17 MED ORDER — INSULIN ASPART 100 UNIT/ML ~~LOC~~ SOLN
0.0000 [IU] | Freq: Every day | SUBCUTANEOUS | Status: DC
  Administered 2017-08-17: 3 [IU] via SUBCUTANEOUS
  Administered 2017-08-18: 4 [IU] via SUBCUTANEOUS
  Administered 2017-08-19: 5 [IU] via SUBCUTANEOUS
  Administered 2017-08-20: 4 [IU] via SUBCUTANEOUS
  Administered 2017-08-21: 5 [IU] via SUBCUTANEOUS
  Administered 2017-08-22: 3 [IU] via SUBCUTANEOUS
  Administered 2017-08-23: 4 [IU] via SUBCUTANEOUS
  Administered 2017-08-24 – 2017-08-25 (×2): 3 [IU] via SUBCUTANEOUS
  Administered 2017-08-26: 2 [IU] via SUBCUTANEOUS

## 2017-08-17 MED ORDER — HYDRALAZINE HCL 50 MG PO TABS
50.0000 mg | ORAL_TABLET | Freq: Three times a day (TID) | ORAL | Status: DC
  Administered 2017-08-17 – 2017-08-23 (×16): 50 mg via ORAL
  Filled 2017-08-17 (×16): qty 1

## 2017-08-17 MED ORDER — ASPIRIN 300 MG RE SUPP
300.0000 mg | Freq: Once | RECTAL | Status: AC
  Administered 2017-08-17: 300 mg via RECTAL
  Filled 2017-08-17: qty 1

## 2017-08-17 MED ORDER — DONEPEZIL HCL 10 MG PO TABS
10.0000 mg | ORAL_TABLET | Freq: Every day | ORAL | Status: DC
  Administered 2017-08-17 – 2017-08-26 (×10): 10 mg via ORAL
  Filled 2017-08-17 (×10): qty 1

## 2017-08-17 MED ORDER — VANCOMYCIN HCL IN DEXTROSE 1-5 GM/200ML-% IV SOLN
1000.0000 mg | INTRAVENOUS | Status: DC
  Administered 2017-08-18 – 2017-08-19 (×2): 1000 mg via INTRAVENOUS
  Filled 2017-08-17 (×3): qty 200

## 2017-08-17 MED ORDER — HEPARIN (PORCINE) IN NACL 100-0.45 UNIT/ML-% IJ SOLN
1250.0000 [IU]/h | INTRAMUSCULAR | Status: DC
  Administered 2017-08-17 – 2017-08-18 (×2): 1100 [IU]/h via INTRAVENOUS
  Administered 2017-08-18 – 2017-08-20 (×3): 1250 [IU]/h via INTRAVENOUS
  Filled 2017-08-17 (×5): qty 250

## 2017-08-17 MED ORDER — MEMANTINE HCL-DONEPEZIL HCL ER 28-10 MG PO CP24: 1.0000 | ORAL_CAPSULE | Freq: Every day | ORAL | Status: DC

## 2017-08-17 MED ORDER — ATORVASTATIN CALCIUM 20 MG PO TABS
20.0000 mg | ORAL_TABLET | Freq: Every day | ORAL | Status: DC
  Administered 2017-08-17 – 2017-08-26 (×10): 20 mg via ORAL
  Filled 2017-08-17 (×10): qty 1

## 2017-08-17 MED ORDER — ASPIRIN EC 81 MG PO TBEC
81.0000 mg | DELAYED_RELEASE_TABLET | Freq: Every day | ORAL | Status: DC
  Administered 2017-08-18 – 2017-08-27 (×10): 81 mg via ORAL
  Filled 2017-08-17 (×10): qty 1

## 2017-08-17 MED ORDER — VANCOMYCIN HCL IN DEXTROSE 1-5 GM/200ML-% IV SOLN
1000.0000 mg | Freq: Once | INTRAVENOUS | Status: AC
  Administered 2017-08-17: 1000 mg via INTRAVENOUS
  Filled 2017-08-17: qty 200

## 2017-08-17 MED ORDER — SULFACETAMIDE SODIUM 10 % OP SOLN
1.0000 [drp] | Freq: Four times a day (QID) | OPHTHALMIC | Status: DC
  Administered 2017-08-17 – 2017-08-27 (×37): 1 [drp] via OPHTHALMIC
  Filled 2017-08-17: qty 15

## 2017-08-17 MED ORDER — FERROUS SULFATE 325 (65 FE) MG PO TABS
325.0000 mg | ORAL_TABLET | Freq: Every day | ORAL | Status: DC
  Administered 2017-08-18 – 2017-08-27 (×10): 325 mg via ORAL
  Filled 2017-08-17 (×10): qty 1

## 2017-08-17 MED ORDER — LEVALBUTEROL HCL 0.63 MG/3ML IN NEBU
0.6300 mg | INHALATION_SOLUTION | Freq: Once | RESPIRATORY_TRACT | Status: AC
  Administered 2017-08-17: 0.63 mg via RESPIRATORY_TRACT
  Filled 2017-08-17: qty 3

## 2017-08-17 MED ORDER — SODIUM CHLORIDE 0.9 % IV BOLUS (SEPSIS)
500.0000 mL | Freq: Once | INTRAVENOUS | Status: AC
  Administered 2017-08-17: 500 mL via INTRAVENOUS

## 2017-08-17 MED ORDER — PIPERACILLIN-TAZOBACTAM 3.375 G IVPB 30 MIN
3.3750 g | Freq: Once | INTRAVENOUS | Status: AC
  Administered 2017-08-17: 3.375 g via INTRAVENOUS
  Filled 2017-08-17: qty 50

## 2017-08-17 MED ORDER — INSULIN ASPART 100 UNIT/ML ~~LOC~~ SOLN
0.0000 [IU] | Freq: Three times a day (TID) | SUBCUTANEOUS | Status: DC
  Administered 2017-08-18: 9 [IU] via SUBCUTANEOUS

## 2017-08-17 MED ORDER — IPRATROPIUM BROMIDE 0.02 % IN SOLN
0.5000 mg | Freq: Three times a day (TID) | RESPIRATORY_TRACT | Status: DC
  Administered 2017-08-18 – 2017-08-27 (×28): 0.5 mg via RESPIRATORY_TRACT
  Filled 2017-08-17 (×29): qty 2.5

## 2017-08-17 MED ORDER — LEVALBUTEROL HCL 0.63 MG/3ML IN NEBU: 0.6300 mg | INHALATION_SOLUTION | Freq: Three times a day (TID) | RESPIRATORY_TRACT | Status: DC

## 2017-08-17 MED ORDER — IPRATROPIUM BROMIDE 0.02 % IN SOLN
0.5000 mg | Freq: Once | RESPIRATORY_TRACT | Status: AC
  Administered 2017-08-17: 0.5 mg via RESPIRATORY_TRACT
  Filled 2017-08-17: qty 2.5

## 2017-08-17 MED ORDER — METHYLPREDNISOLONE SODIUM SUCC 125 MG IJ SOLR
60.0000 mg | Freq: Four times a day (QID) | INTRAMUSCULAR | Status: DC
  Filled 2017-08-17: qty 2

## 2017-08-17 MED ORDER — METHYLPREDNISOLONE SODIUM SUCC 125 MG IJ SOLR
60.0000 mg | Freq: Four times a day (QID) | INTRAMUSCULAR | Status: DC
  Administered 2017-08-18 (×5): 60 mg via INTRAVENOUS
  Filled 2017-08-17 (×6): qty 2

## 2017-08-17 MED ORDER — PIPERACILLIN-TAZOBACTAM 3.375 G IVPB
3.3750 g | Freq: Three times a day (TID) | INTRAVENOUS | Status: DC
  Administered 2017-08-17 – 2017-08-19 (×5): 3.375 g via INTRAVENOUS
  Filled 2017-08-17 (×6): qty 50

## 2017-08-17 MED ORDER — LEVALBUTEROL HCL 0.63 MG/3ML IN NEBU
0.6300 mg | INHALATION_SOLUTION | RESPIRATORY_TRACT | Status: DC
  Administered 2017-08-17: 0.63 mg via RESPIRATORY_TRACT
  Filled 2017-08-17: qty 3

## 2017-08-17 MED ORDER — SODIUM CHLORIDE 0.9 % IV SOLN
INTRAVENOUS | Status: DC
  Administered 2017-08-17: 21:00:00 via INTRAVENOUS

## 2017-08-17 MED ORDER — DIVALPROEX SODIUM 125 MG PO CSDR
250.0000 mg | DELAYED_RELEASE_CAPSULE | Freq: Three times a day (TID) | ORAL | Status: DC
  Administered 2017-08-17 – 2017-08-27 (×29): 250 mg via ORAL
  Filled 2017-08-17 (×29): qty 2

## 2017-08-17 MED ORDER — LEVALBUTEROL HCL 0.63 MG/3ML IN NEBU
0.6300 mg | INHALATION_SOLUTION | RESPIRATORY_TRACT | Status: DC | PRN
  Filled 2017-08-17: qty 3

## 2017-08-17 MED ORDER — GABAPENTIN 100 MG PO CAPS
200.0000 mg | ORAL_CAPSULE | Freq: Every day | ORAL | Status: DC
Start: 2017-08-18 — End: 2017-08-27
  Administered 2017-08-18 – 2017-08-26 (×9): 200 mg via ORAL
  Filled 2017-08-17 (×9): qty 2

## 2017-08-17 MED ORDER — ACETAMINOPHEN 325 MG PO TABS: 650.0000 mg | ORAL_TABLET | Freq: Four times a day (QID) | ORAL | Status: DC | PRN

## 2017-08-17 MED ORDER — LEVALBUTEROL HCL 0.63 MG/3ML IN NEBU
0.6300 mg | INHALATION_SOLUTION | Freq: Three times a day (TID) | RESPIRATORY_TRACT | Status: DC
  Administered 2017-08-18 – 2017-08-27 (×28): 0.63 mg via RESPIRATORY_TRACT
  Filled 2017-08-17 (×29): qty 3

## 2017-08-17 MED ORDER — SODIUM CHLORIDE 0.9 % IV BOLUS (SEPSIS)
1000.0000 mL | Freq: Once | INTRAVENOUS | Status: AC
  Administered 2017-08-17: 1000 mL via INTRAVENOUS

## 2017-08-17 MED ORDER — MEMANTINE HCL ER 28 MG PO CP24
28.0000 mg | ORAL_CAPSULE | Freq: Every day | ORAL | Status: DC
  Administered 2017-08-17 – 2017-08-26 (×10): 28 mg via ORAL
  Filled 2017-08-17 (×10): qty 1

## 2017-08-17 MED ORDER — INSULIN GLARGINE 100 UNIT/ML ~~LOC~~ SOLN
8.0000 [IU] | Freq: Every day | SUBCUTANEOUS | Status: DC
  Administered 2017-08-17: 8 [IU] via SUBCUTANEOUS
  Filled 2017-08-17: qty 0.08

## 2017-08-17 MED ORDER — HEPARIN SODIUM (PORCINE) 5000 UNIT/ML IJ SOLN: 5000.0000 [IU] | Freq: Three times a day (TID) | INTRAMUSCULAR | Status: DC

## 2017-08-17 MED ORDER — VITAMIN D 1000 UNITS PO TABS
2000.0000 [IU] | ORAL_TABLET | Freq: Every day | ORAL | Status: DC
  Administered 2017-08-18 – 2017-08-27 (×10): 2000 [IU] via ORAL
  Filled 2017-08-17 (×10): qty 2

## 2017-08-17 MED ORDER — HEPARIN BOLUS VIA INFUSION
4000.0000 [IU] | Freq: Once | INTRAVENOUS | Status: AC
  Administered 2017-08-17: 4000 [IU] via INTRAVENOUS
  Filled 2017-08-17: qty 4000

## 2017-08-17 MED ORDER — IPRATROPIUM BROMIDE 0.02 % IN SOLN
0.5000 mg | RESPIRATORY_TRACT | Status: DC
  Administered 2017-08-17: 0.5 mg via RESPIRATORY_TRACT
  Filled 2017-08-17: qty 2.5

## 2017-08-17 MED ORDER — IPRATROPIUM BROMIDE 0.02 % IN SOLN: 0.5000 mg | RESPIRATORY_TRACT | Status: DC

## 2017-08-17 MED ORDER — SACCHAROMYCES BOULARDII 250 MG PO CAPS
250.0000 mg | ORAL_CAPSULE | Freq: Two times a day (BID) | ORAL | Status: DC
Start: 2017-08-17 — End: 2017-08-27
  Administered 2017-08-17 – 2017-08-27 (×20): 250 mg via ORAL
  Filled 2017-08-17 (×19): qty 1

## 2017-08-17 MED ORDER — IPRATROPIUM BROMIDE 0.02 % IN SOLN: 0.5000 mg | Freq: Three times a day (TID) | RESPIRATORY_TRACT | Status: DC

## 2017-08-17 MED ORDER — METHYLPREDNISOLONE SODIUM SUCC 125 MG IJ SOLR
125.0000 mg | Freq: Once | INTRAMUSCULAR | Status: AC
  Administered 2017-08-17: 125 mg via INTRAVENOUS
  Filled 2017-08-17: qty 2

## 2017-08-17 NOTE — ED Notes (Signed)
[facility] contacted to activate Code Sepsis 

## 2017-08-17 NOTE — Progress Notes (Signed)
Decreased O2 to 40% and BUR to 10 due to ABG results

## 2017-08-17 NOTE — ED Notes (Signed)
Dr Rhunette Croft given a copy of lactic acid results 2.10

## 2017-08-17 NOTE — ED Notes (Signed)
Pt sister requesting update if anything changes, please call Alfonso Ellis at [phone]

## 2017-08-17 NOTE — ED Provider Notes (Signed)
[facility]-EMERGENCY DEPT Provider Note   CSN: [id number] Arrival date & time: 08/17/17  1413     History   Chief Complaint Chief Complaint  Patient presents with  . Shortness of Breath    HPI Travis Coleman is a 83 y.o. male.  HPI  Pt with hx of CHF, CAD, CKD, DM comes in with cc of dib. LEVEL 5 CAVEAT FOR DEMENTIA AND RESPIRATORY DISTRESS.  Per family, patient had started getting sick 2 days ago. Pt resides at [facility], and they called EMS due to worsening shortness of breath. Pt was started on Bipap at the scene due to his hypoxia and respiratory distress. Pt was given lasix y'day per EMS records.   Past Medical History:  Diagnosis Date  . Anemia   . Bradycardia   . CAD (coronary artery disease) of bypass graft    Multivessel  . Cataract    bilateral  . CHF (congestive heart failure) (HCC)   . Chronic kidney disease    Stage 3  . Dementia    MMSE 18/30 Feb 2012 [facility]), with behavioral disturbances  . Diabetes mellitus   . Dysphagia   . Hearing deficit   . Hyperlipidemia   . Hypertension   . Neuropathy   . PVD (peripheral vascular disease) (HCC)   . Thrombocytopenia [facility])     Patient Active Problem List   Diagnosis Date Noted  . Acute on chronic respiratory failure with hypoxia (HCC) 08/17/2017  . Sepsis (HCC) 08/17/2017  . Acute respiratory failure (HCC) 08/17/2017  . Chronic kidney disease (CKD), stage III (moderate) 01/23/2017  . Hx of CABG 12/25/2016  . 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 Anemia in chronic kidney disease 11/10/2015  . Hypertensive heart disease with CHF (congestive heart failure) (HCC) 11/08/2015  . Chronic systolic CHF (congestive heart failure) (HCC) 11/08/2015  . Vascular dementia without behavioral disturbance 11/08/2015  . Dyslipidemia associated with type 2 diabetes mellitus (HCC) 11/08/2015  . Bradycardia   . PVD (peripheral vascular disease) (HCC) 01/03/2011  . CAD (coronary artery  disease) s/p CABG 01/03/2011    Past Surgical History:  Procedure Laterality Date  . CORONARY ARTERY BYPASS GRAFT    . PERIPHERAL VASCULAR CATHETERIZATION N/A 11/28/2016   Procedure: Abdominal Aortogram w/Lower Extremity;  Surgeon: Maeola Harman, MD;  Location: [facility] INVASIVE CV LAB;  Service: Cardiovascular;  Laterality: N/A;       Home Medications    Prior to Admission medications   Medication Sig Start Date End Date Taking? Authorizing Provider  acetaminophen (TYLENOL) 650 MG CR tablet Take 650 mg by mouth every 6 (six) hours.   Yes [provider]  aspirin EC 81 MG tablet Take 81 mg by mouth daily.   Yes [provider]  atorvastatin (LIPITOR) 20 MG tablet Take 20 mg by mouth at bedtime.   Yes [provider]  cholecalciferol (VITAMIN D) 1000 UNITS tablet Take 2,000 Units by mouth daily.   Yes [provider]  divalproex (DEPAKOTE SPRINKLE) 125 MG capsule Take 250 mg by mouth 3 (three) times daily.    Yes [provider]  enalapril (VASOTEC) 20 MG tablet Take 20 mg by mouth daily.   Yes [provider]  Eyelid Cleansers (OCUSOFT EYELID CLEANSING EX) Apply to bilateral eye lids topically one time daily for Bilateral Marginal Blepharitis   Yes [provider]  ferrous sulfate 325 (65 FE) MG tablet Take 325 mg by mouth daily with breakfast.  Yes [provider]  gabapentin (NEURONTIN) 100 MG capsule Take 200 mg by mouth at bedtime.   Yes [provider]  hydrALAZINE (APRESOLINE) 50 MG tablet Take 50 mg by mouth every 8 (eight) hours.   Yes [provider]  insulin aspart (NOVOLOG) 100 UNIT/ML injection Inject as per sliding scale: if  0-59=0 Call MD ; 60-69 = 0 units, 70-150= 10; 151-450 = 13 units; 450 + = 0 unitsCall MD for CBG < 60 or > 450, subcutaneously 3 times daily after meals related to DM.   Yes [provider]  insulin glargine (LANTUS) 100 UNIT/ML injection Inject 8  Units into the skin at bedtime.    Yes [provider]  ipratropium-albuterol (DUONEB) 0.5-2.5 (3) MG/3ML SOLN Take 3 mLs by nebulization every 6 (six) hours as needed.   Yes [provider]  levofloxacin (LEVAQUIN) 750 MG tablet Take 750 mg by mouth daily. 08/16/17 08/30/17 Yes [provider]  linagliptin (TRADJENTA) 5 MG TABS tablet Take 5 mg by mouth daily.   Yes [provider]  Memantine HCl-Donepezil HCl (NAMZARIC) 28-10 MG CP24 Take 1 capsule by mouth at bedtime.    Yes [provider]  saccharomyces boulardii (FLORASTOR) 250 MG capsule Take 250 mg by mouth 2 (two) times daily.   Yes [provider]  sulfacetamide (BLEPH-10) 10 % ophthalmic solution Place 1 drop into both eyes 4 (four) times daily.   Yes [provider]    Family History Family History  Problem Relation Age of Onset  . Diabetes Mother   . Dementia Mother        alzheimer  . Dementia Father   . Stroke Father   . Heart disease Father   . Dementia Sister     Social History Social History  Substance Use Topics  . Smoking status: Former Smoker    Quit date: 11/19/1948  . Smokeless tobacco: Never Used  . Alcohol use No     Allergies   Patient has no known allergies.   Review of Systems Review of Systems  Unable to perform ROS: Severe respiratory distress     Physical Exam Updated Vital Signs BP (!) 157/70   Pulse 97   Temp (!) 100.9 F (38.3 C) (Rectal)   Resp (!) 22   SpO2 96%   Physical Exam  Constitutional: He appears well-developed.  HENT:  Head: Atraumatic.  Neck: Neck supple. No JVD present.  Cardiovascular: Normal rate.   Pulmonary/Chest: He is in respiratory distress.  On bipap, + rhonchi  Abdominal: Soft. There is no tenderness.  Musculoskeletal: He exhibits no edema.  Neurological: He is alert.  Skin: Skin is warm.  Nursing note and vitals reviewed.    ED Treatments / Results  Labs (all labs ordered are listed,  but only abnormal results are displayed) Labs Reviewed  BASIC METABOLIC PANEL - Abnormal; Notable for the following:       Result Value   CO2 18 (*)    Glucose, Bld 293 (*)    BUN 48 (*)    Creatinine, Ser 2.37 (*)    Calcium 7.9 (*)    GFR calc non Af Amer 24 (*)    GFR calc Af Amer 28 (*)    All other components within normal limits  CBC - Abnormal; Notable for the following:    RBC 3.33 (*)    Hemoglobin 10.2 (*)    HCT 32.6 (*)    Platelets 130 (*)  All other components within normal limits  URINALYSIS, ROUTINE W REFLEX MICROSCOPIC - Abnormal; Notable for the following:    APPearance CLOUDY (*)    Protein, ur >=300 (*)    Leukocytes, UA SMALL (*)    Bacteria, UA RARE (*)    Squamous Epithelial / LPF 0-5 (*)    All other components within normal limits  TROPONIN I - Abnormal; Notable for the following:    Troponin I 3.10 (*)    All other components within normal limits  BRAIN NATRIURETIC PEPTIDE - Abnormal; Notable for the following:    B Natriuretic Peptide 314.7 (*)    All other components within normal limits  APTT - Abnormal; Notable for the following:    aPTT 38 (*)    All other components within normal limits  I-STAT TROPONIN, ED - Abnormal; Notable for the following:    Troponin i, poc 2.66 (*)    All other components within normal limits  I-STAT CG4 LACTIC ACID, ED - Abnormal; Notable for the following:    Lactic Acid, Venous 2.10 (*)    All other components within normal limits  I-STAT ARTERIAL BLOOD GAS, ED - Abnormal; Notable for the following:    pO2, Arterial 315.0 (*)    All other components within normal limits  CULTURE, BLOOD (ROUTINE X 2)  CULTURE, BLOOD (ROUTINE X 2)  URINE CULTURE  PROTIME-INR  INFLUENZA PANEL BY PCR (TYPE A & B)  HEPARIN LEVEL (UNFRACTIONATED)  CBC  I-STAT CG4 LACTIC ACID, ED    EKG  EKG Interpretation  Date/Time:  Saturday August 17 2017 14:27:57 EDT Ventricular Rate:  115 PR Interval:    QRS Duration: 103 QT  Interval:  314 QTC Calculation: 435 R Axis:   97 Text Interpretation:  Sinus tachycardia Right axis deviation Abnormal T, consider ischemia, diffuse leads No acute changes T wave inversion in inferior and lateral leads - new Confirmed by Derwood Kaplan [phone]) on 08/17/2017 3:13:47 PM       EKG Interpretation  Date/Time:  Saturday August 17 2017 15:47:08 EDT Ventricular Rate:  114 PR Interval:    QRS Duration: 97 QT Interval:  282 QTC Calculation: 389 R Axis:   82 Text Interpretation:  POSTEIROR EKG Sinus tachycardia Borderline right axis deviation Low voltage, precordial leads Nonspecific T abnormalities, inferior leads Confirmed by Derwood Kaplan [phone]) on 08/17/2017 4:46:29 PM        Radiology Dg Chest Portable 1 View  Result Date: 08/17/2017 CLINICAL DATA:  SOB today. Pt here from nursing home. On BiPAP at time of imaging. Hx of HTN, CHF, DM, CAD, CKD. EXAM: PORTABLE CHEST 1 VIEW COMPARISON:  11/03/2015 FINDINGS: Status post median sternotomy and CABG. The heart is enlarged. There are no focal consolidations or pleural effusions. No pulmonary edema. IMPRESSION: Stable cardiomegaly. Electronically Signed   By: Norva Pavlov M.D.   On: 08/17/2017 15:25    Procedures Procedures (including critical care time)  CRITICAL CARE Performed by: Derwood Kaplan   Total critical care time: 48 minutes  Critical care time was exclusive of separately billable procedures and treating other patients.  Critical care was necessary to treat or prevent imminent or life-threatening deterioration.  Critical care was time spent personally by me on the following activities: development of treatment plan with patient and/or surrogate as well as nursing, discussions with consultants, evaluation of patient's response to treatment, examination of patient, obtaining history from patient or surrogate, ordering and performing treatments and interventions, ordering and review of laboratory studies,  ordering and review of radiographic studies, pulse oximetry and re-evaluation of patient's condition.   Medications Ordered in ED Medications  aspirin suppository 300 mg (not administered)  piperacillin-tazobactam (ZOSYN) IVPB 3.375 g (not administered)  vancomycin (VANCOCIN) IVPB 1000 mg/200 mL premix (not administered)  heparin bolus via infusion 4,000 Units (not administered)  heparin ADULT infusion 100 units/mL (25000 units/260mL sodium chloride 0.45%) (not administered)  sodium chloride 0.9 % bolus 1,000 mL (not administered)  sodium chloride 0.9 % bolus 500 mL (not administered)  piperacillin-tazobactam (ZOSYN) IVPB 3.375 g (0 g Intravenous Stopped 08/17/17 1616)  vancomycin (VANCOCIN) IVPB 1000 mg/200 mL premix (1,000 mg Intravenous New Bag/Given 08/17/17 1557)     Initial Impression / Assessment and Plan / ED Course  I have reviewed the triage vital signs and the nursing notes.  Pertinent labs & imaging results that were available during my care of the patient were reviewed by me and considered in my medical decision making (see chart for details).  Clinical Course as of Aug 17 1704  Sat Aug 17, 2017  1701 Pt still on bipap - FiO2 to 40% now. pO2, Arterial: (!) 315.0 [AN]  1701 No Anion gap Glucose: (!) 293 [AN]  1702 Trop is rising. EKG neg for acute STEMI. Heparin ordered. Cards consulted.  Troponin I: (!!) 3.10 [AN]  1704 PE possible, as CXR is clear. Discussed this with the hospitalist - they will add VQ scan. B Natriuretic Peptide: (!) 314.7 [AN]    Clinical Course User Index [AN] Derwood Kaplan, MD    Pt comes in with cc of shortness of breath. Pt has hypoxic respiratory failure. Pt has hx of CAD s/p CABG, CHF, CKD. Pt is also noted to have fevers, with rhonchus breath sounds and he has a foley catheter in place.  Concerns for sepsis - with pneumonia and UTI as the source. Additionally, concerns for pulmonary edema, CHF exacerbation, PE, ACS as well.  EKG has some  subtle changes, but not meeting STEMI criteria. I ordered posterior EKG, and that is not showing STEMI either.      Final Clinical Impressions(s) / ED Diagnoses   Final diagnoses:  Severe sepsis (HCC)  Acute respiratory distress  Acute respiratory failure with hypoxia (HCC)  NSTEMI (non-ST elevated myocardial infarction) [facility])    New Prescriptions New Prescriptions   No medications on file     Derwood Kaplan, MD 08/17/17 1705

## 2017-08-17 NOTE — Progress Notes (Signed)
Patient transported to 2C15 on Bipap without complication.

## 2017-08-17 NOTE — Consult Note (Signed)
CARDIOLOGY INPATIENT CONSULTATION NOTE  Patient ID: Travis Coleman MRN: [id number], DOB/AGE: 1931-12-06   Admit date: 08/17/2017   Primary Physician: Florentina Jenny, MD Primary Cardiologist:new  Reason for Consult:   Elevated troponin  Requesting Physician: Derwood Kaplan MD (ED)  HPI: This is a 83 y.o. male with CAD status post CABG, CHF, chronic kidney disease stage III, advanced dementia, hypertension, hyperlipidemia, PVD presented from [facility] skilled nursing facility for shortness of breath. Patient is unable to provide history due to dementia.     Patient was in his usual state of health about 3 days ago when he developed worsening SOB. He was initially treated for reactive airway dz with nebs and levaquin as well as lasix. However he did not improve. He did not have any fever reported. Today he was hypoxic and came to the ED where he required bipap. At baseline patient is not functional, completely dependent, cannot ambulate.  Ed evaluated and found that creatinine was elevated to 2.4 from 1.4 baseline. Trop was 3.1. There was no report of chest pain otherwise.   EKG showed rate 114, tachycardia, nonspecific T-wave changes in inferior leads - Left ventricle: The cavity size was normal. There was mild   concentric hypertrophy. Systolic function was normal. The   estimated ejection fraction was in the range of 50% to 55%. Wall   motion was normal; there were no regional wall motion   abnormalities. Doppler parameters are consistent with abnormal   left ventricular relaxation (grade 1 diastolic dysfunction). - Aortic valve: Valve mobility was restricted. Transvalvular   velocity was increased, due to stenosis. There was mild stenosis.   Valve area (VTI): 1.31 cm^2. Valve area (Vmax): 1.41 cm^2. Valve   area (Vmean): 1.18 cm^2. - Mitral valve: Calcified annulus. There was mild regurgitation. - Left atrium: The atrium was mildly dilated. - Right atrium: The atrium was mildly  dilated.  Peripheral angiogram 11/28/2016 Aorta iliac and SFA segments appeared patent. There is disease of the right popliteal artery at the knee and the significant tibial disease as evaluated by CO2 contrast.  Problem List: Past Medical History:  Diagnosis Date  . Anemia   . Bradycardia   . CAD (coronary artery disease) of bypass graft    Multivessel  . Cataract    bilateral  . CHF (congestive heart failure) (HCC)   . Chronic kidney disease    Stage 3  . Dementia    MMSE 18/30 Feb 2012 [facility]), with behavioral disturbances  . Diabetes mellitus   . Dysphagia   . Hearing deficit   . Hyperlipidemia   . Hypertension   . Neuropathy   . PVD (peripheral vascular disease) (HCC)   . Thrombocytopenia (HCC)     Past Surgical History:  Procedure Laterality Date  . CORONARY ARTERY BYPASS GRAFT    . PERIPHERAL VASCULAR CATHETERIZATION N/A 11/28/2016   Procedure: Abdominal Aortogram w/Lower Extremity;  Surgeon: Maeola Harman, MD;  Location: [facility] INVASIVE CV LAB;  Service: Cardiovascular;  Laterality: N/A;     Allergies: No Known Allergies   Home Medications Current Facility-Administered Medications  Medication Dose Route Frequency Provider Last Rate Last Dose  . heparin ADULT infusion 100 units/mL (25000 units/210mL sodium chloride 0.45%)  1,100 Units/hr Intravenous Continuous Bertram Millard, RPH 11 mL/hr at 08/17/17 1714 1,100 Units/hr at 08/17/17 1714  . ipratropium (ATROVENT) nebulizer solution 0.5 mg  0.5 mg Nebulization Once Rai, Ripudeep K, MD      . levalbuterol (XOPENEX) nebulizer solution 0.63 mg  0.63 mg Nebulization Once Rai, Ripudeep K, MD      . methylPREDNISolone sodium succinate (SOLU-MEDROL) 125 mg/2 mL injection 125 mg  125 mg Intravenous Once Rai, Ripudeep K, MD      . piperacillin-tazobactam (ZOSYN) IVPB 3.375 g  3.375 g Intravenous Q8H Robertson, Crystal S, RPH      . sodium chloride 0.9 % bolus 1,000 mL  1,000 mL Intravenous Once Rhunette Croft, Ankit, MD  1,000 mL/hr at 08/17/17 1709 1,000 mL at 08/17/17 1709  . sodium chloride 0.9 % bolus 500 mL  500 mL Intravenous Once Derwood Kaplan, MD      . Melene Muller ON 08/18/2017] vancomycin (VANCOCIN) IVPB 1000 mg/200 mL premix  1,000 mg Intravenous Q24H Norva Pavlov, [facility]       Current Outpatient Prescriptions  Medication Sig Dispense Refill  . acetaminophen (TYLENOL) 650 MG CR tablet Take 650 mg by mouth every 6 (six) hours.    Marland Kitchen aspirin EC 81 MG tablet Take 81 mg by mouth daily.    Marland Kitchen atorvastatin (LIPITOR) 20 MG tablet Take 20 mg by mouth at bedtime.    . cholecalciferol (VITAMIN D) 1000 UNITS tablet Take 2,000 Units by mouth daily.    . divalproex (DEPAKOTE SPRINKLE) 125 MG capsule Take 250 mg by mouth 3 (three) times daily.     . enalapril (VASOTEC) 20 MG tablet Take 20 mg by mouth daily.    . Eyelid Cleansers (OCUSOFT EYELID CLEANSING EX) Apply to bilateral eye lids topically one time daily for Bilateral Marginal Blepharitis    . ferrous sulfate 325 (65 FE) MG tablet Take 325 mg by mouth daily with breakfast.    . gabapentin (NEURONTIN) 100 MG capsule Take 200 mg by mouth at bedtime.    . hydrALAZINE (APRESOLINE) 50 MG tablet Take 50 mg by mouth every 8 (eight) hours.    . insulin aspart (NOVOLOG) 100 UNIT/ML injection Inject as per sliding scale: if  0-59=0 Call MD ; 60-69 = 0 units, 70-150= 10; 151-450 = 13 units; 450 + = 0 unitsCall MD for CBG < 60 or > 450, subcutaneously 3 times daily after meals related to DM.    Marland Kitchen insulin glargine (LANTUS) 100 UNIT/ML injection Inject 8 Units into the skin at bedtime.     Marland Kitchen ipratropium-albuterol (DUONEB) 0.5-2.5 (3) MG/3ML SOLN Take 3 mLs by nebulization every 6 (six) hours as needed.    Marland Kitchen levofloxacin (LEVAQUIN) 750 MG tablet Take 750 mg by mouth daily.    Marland Kitchen linagliptin (TRADJENTA) 5 MG TABS tablet Take 5 mg by mouth daily.    . Memantine HCl-Donepezil HCl (NAMZARIC) 28-10 MG CP24 Take 1 capsule by mouth at bedtime.     . saccharomyces boulardii  (FLORASTOR) 250 MG capsule Take 250 mg by mouth 2 (two) times daily.    Marland Kitchen sulfacetamide (BLEPH-10) 10 % ophthalmic solution Place 1 drop into both eyes 4 (four) times daily.       Family History  Problem Relation Age of Onset  . Diabetes Mother   . Dementia Mother        alzheimer  . Dementia Father   . Stroke Father   . Heart disease Father   . Dementia Sister      Social History   Social History  . Marital status: Single    Spouse name: N/A  . Number of children: N/A  . Years of education: N/A   Occupational History  . Not on file.   Social History Main Topics  .  Smoking status: Former Smoker    Quit date: 11/19/1948  . Smokeless tobacco: Never Used  . Alcohol use No  . Drug use: No  . Sexual activity: Not Currently   Other Topics Concern  . Not on file   Social History Narrative  . No narrative on file     Review of Systems: General: fatigue weight gain  Dermatological: negative for rash Respiratory: no cough hypoxia Urologic: negative for hematuria Abdominal: negative for nausea, vomiting, diarrhea, bright red blood per rectum, melena, or hematemesis Neurologic: negative for visual changes, syncope, or dizziness Hematology: anemia Psychiatry: non suicidal/homicidal  Musculoskeletal: back pain   Physical Exam: Vitals: BP (!) 159/69   Pulse 95   Temp (!) 100.9 F (38.3 C) (Rectal)   Resp (!) 21   SpO2 97%  General: in respiratory distress on bipap  Neck: JVp elevated Heart: regular rate and rhythm, S1, S2, no murmurs  Lungs: crackles bilaterally up to apex  GI: non tender, non distended, bowel sounds present Extremities: edema Neuro: AAO x 0 at baseline Psych: awake but not responding to simple commands, tolerating bipap, not anxious or agitated  Labs:   Results for orders placed or performed during the hospital encounter of 08/17/17 (from the past 24 hour(s))  Basic metabolic panel     Status: Abnormal   Collection Time: 08/17/17  2:45 PM    Result Value Ref Range   Sodium 138 135 - 145 mmol/L   Potassium 4.8 3.5 - 5.1 mmol/L   Chloride 108 101 - 111 mmol/L   CO2 18 (L) 22 - 32 mmol/L   Glucose, Bld 293 (H) 65 - 99 mg/dL   BUN 48 (H) 6 - 20 mg/dL   Creatinine, Ser 1.61 (H) 0.61 - 1.24 mg/dL   Calcium 7.9 (L) 8.9 - 10.3 mg/dL   GFR calc non Af Amer 24 (L) >60 mL/min   GFR calc Af Amer 28 (L) >60 mL/min   Anion gap 12 5 - 15  CBC     Status: Abnormal   Collection Time: 08/17/17  2:45 PM  Result Value Ref Range   WBC 5.4 4.0 - 10.5 K/uL   RBC 3.33 (L) 4.22 - 5.81 MIL/uL   Hemoglobin 10.2 (L) 13.0 - 17.0 g/dL   HCT 09.6 (L) 04.5 - 40.9 %   MCV 97.9 78.0 - 100.0 fL   MCH 30.6 26.0 - 34.0 pg   MCHC 31.3 30.0 - 36.0 g/dL   RDW 81.1 91.4 - 78.2 %   Platelets 130 (L) 150 - 400 K/uL  I-stat troponin, ED     Status: Abnormal   Collection Time: 08/17/17  2:52 PM  Result Value Ref Range   Troponin i, poc 2.66 (HH) 0.00 - 0.08 ng/mL   Comment NOTIFIED PHYSICIAN    Comment 3          I-Stat arterial blood gas, ED     Status: Abnormal   Collection Time: 08/17/17  3:02 PM  Result Value Ref Range   pH, Arterial 7.392 7.350 - 7.450   pCO2 arterial 38.3 32.0 - 48.0 mmHg   pO2, Arterial 315.0 (H) 83.0 - 108.0 mmHg   Bicarbonate 23.3 20.0 - 28.0 mmol/L   TCO2 24 22 - 32 mmol/L   O2 Saturation 100.0 %   Acid-base deficit 1.0 0.0 - 2.0 mmol/L   Patient temperature 98.6 F    Collection site RADIAL, ALLEN'S TEST ACCEPTABLE    Drawn by Operator    Sample type  ARTERIAL   Troponin I     Status: Abnormal   Collection Time: 08/17/17  3:39 PM  Result Value Ref Range   Troponin I 3.10 (HH) <0.03 ng/mL  Brain natriuretic peptide     Status: Abnormal   Collection Time: 08/17/17  3:39 PM  Result Value Ref Range   B Natriuretic Peptide 314.7 (H) 0.0 - 100.0 pg/mL  Protime-INR     Status: None   Collection Time: 08/17/17  3:39 PM  Result Value Ref Range   Prothrombin Time 14.7 11.4 - 15.2 seconds   INR 1.16   APTT     Status:  Abnormal   Collection Time: 08/17/17  3:39 PM  Result Value Ref Range   aPTT 38 (H) 24 - 36 seconds  I-Stat CG4 Lactic Acid, ED  (not at  [facility])     Status: Abnormal   Collection Time: 08/17/17  3:50 PM  Result Value Ref Range   Lactic Acid, Venous 2.10 (HH) 0.5 - 1.9 mmol/L   Comment NOTIFIED PHYSICIAN   Urinalysis, Routine w reflex microscopic     Status: Abnormal   Collection Time: 08/17/17  3:54 PM  Result Value Ref Range   Color, Urine YELLOW YELLOW   APPearance CLOUDY (A) CLEAR   Specific Gravity, Urine 1.017 1.005 - 1.030   pH 5.0 5.0 - 8.0   Glucose, UA NEGATIVE NEGATIVE mg/dL   Hgb urine dipstick NEGATIVE NEGATIVE   Bilirubin Urine NEGATIVE NEGATIVE   Ketones, ur NEGATIVE NEGATIVE mg/dL   Protein, ur >=132 (A) NEGATIVE mg/dL   Nitrite NEGATIVE NEGATIVE   Leukocytes, UA SMALL (A) NEGATIVE   RBC / HPF 6-30 0 - 5 RBC/hpf   WBC, UA 6-30 0 - 5 WBC/hpf   Bacteria, UA RARE (A) NONE SEEN   Squamous Epithelial / LPF 0-5 (A) NONE SEEN   WBC Clumps PRESENT    Mucus PRESENT    Hyaline Casts, UA PRESENT    Granular Casts, UA PRESENT      Radiology/Studies: Dg Chest Portable 1 View  Result Date: 08/17/2017 CLINICAL DATA:  SOB today. Pt here from nursing home. On BiPAP at time of imaging. Hx of HTN, CHF, DM, CAD, CKD. EXAM: PORTABLE CHEST 1 VIEW COMPARISON:  11/03/2015 FINDINGS: Status post median sternotomy and CABG. The heart is enlarged. There are no focal consolidations or pleural effusions. No pulmonary edema. IMPRESSION: Stable cardiomegaly. Electronically Signed   By: Norva Pavlov M.D.   On: 08/17/2017 15:25    Medical decision making:  Discussed care with the ED physician  Reviewed labs and imaging personally Reviewed prior records  ASSESSMENT AND PLAN:  This is a 83 y.o. male with known stable angina s/p CABG with LIMA to dLAD, SVG to D1, SVG to OM1, SVG to PDA and sequentual ti to PLV1 and PLV2 (04/09/2002), HLD, Dm2, CKD 3, advanced dementia presented with worsening  renal failure, sepsis (likely from UTI),  Elevated troponin and acute respiratory failure.    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 Will check echo in the AM. Hemodynamically and electrically stable. Has prior h/o CABG in 2003 (high risk) with advanced dementia, SVG grafts >15 years ago - currently plan to treat conservatively with medical therapy aspirin, IV heparin, high dose statin, discuss with family regarding plans for ischemic evaluation, echocardiogram   Dyslipidemia: will continue lipitor 20 mg daily ldl is 92   Hypertension:  B/p 138/68  Has calcification in bilateral renal arteries  will continue vasotec  20 mg daily  hydralazine  50 mg every 8 hours.   CAD: status post cabg: no complaint of chest pain present; will continue asa 81 mg daily   Chronic diastolic heart failure EF 50-55% (11-04-15) he is presently stable will  is presently not on diuretic continue hydralazine 50 mg  three times daily   PVD: will continue asa 81 mg daily; tylenol 650 mg every 6 hours for pain management takes neurontin 200 mg nightly   AKI on CKD - creatinine 2.4 up from baseline 1.4 today. Avoid nephrotoxins. Treat per primary team. Avoid overhydration. Consider CVP monitor    Signed, Joellyn Rued, MD MS 08/17/2017, 5:46 PM

## 2017-08-17 NOTE — ED Notes (Signed)
Attempted report 

## 2017-08-17 NOTE — ED Triage Notes (Signed)
To ED via [facility] from [facility] for eval of sob. Per EMS pt hot to touch. Lasix given yesterday. Arrived on CPAP. Placed on Bi-pap on arrival to ED

## 2017-08-17 NOTE — Progress Notes (Signed)
ANTICOAGULATION CONSULT NOTE - Initial Consult  Pharmacy Consult for heparin Indication: chest pain/ACS  No Known Allergies  Patient Measurements:   Heparin Dosing Weight: 88 kg  Vital Signs: Temp: 100.9 F (38.3 C) (09/29 1550) Temp Source: Rectal (09/29 1550) BP: 157/70 (09/29 1645) Pulse Rate: 97 (09/29 1645)  Labs:  Recent Labs  08/17/17 1445 08/17/17 1539  HGB 10.2*  --   HCT 32.6*  --   PLT 130*  --   APTT  --  38*  LABPROT  --  14.7  INR  --  1.16  CREATININE 2.37*  --     Estimated Creatinine Clearance: 26.4 mL/min (A) (by C-G formula based on SCr of 2.37 mg/dL (H)).  Assessment: 83 yo presenting from NH with SOB possible ACS  No AC PTA  Goal of Therapy:  Heparin level 0.3-0.7 units/ml Monitor platelets by anticoagulation protocol: Yes   Plan:  Heparin bolus 4000 units x 1 Heparin infusion 1100 units/hr Daily HL, CBC  Isaac Bliss, PharmD, BCPS, BCCCP Clinical Pharmacist Clinical phone for 08/17/2017 from 7a-3:30p: [phone] If after 3:30p, please call main pharmacy at: [phone] 08/17/2017 4:51 PM

## 2017-08-17 NOTE — H&P (Signed)
History and Physical        Hospital Admission Note Date: 08/17/2017  Patient name: Travis Coleman Medical record number: [id number] Date of birth: 1932/06/20 Age: 83 y.o. Gender: male  PCP: Florentina Jenny, MD    Patient coming from:   I have reviewed all records in the [facility].    Chief Complaint:  Shortness of breath in the last 2-3 days  HPI: Patient is a 83 year old male with history of CAD status post CABG, CHF, chronic kidney disease stage III, advanced dementia, hypertension, hyperlipidemia, PVD presented from [facility] skilled nursing facility for shortness of breath. History was obtained from the ER records and patient's sister at the bedside. Per patient's sister, patient had shortness of breath in the last 2-3 days at the facility and patient was placed on nebulizer treatments, Levaquin. He also received Lasix yesterday. She did not report any fevers. Today, EMS was called for acute respiratory distress and hypoxia, was placed on BiPAP in ED. Patient was noted to have diffuse rhonchi and wheezing. At baseline, patient is wheelchair bound, does not ambulate. [name] mentioned that patient was coughing and appeared to be choking on food yesterday. At the time of my encounter, patient is on BiPAP, has advanced dementia and unable to provide any history.  ED work-up/course:  Temp 101.1, respiratory rate 29, heart rate 108, O2 sats 100% on BiPAP ABG showed pH of 7.39, PO2 315, PCO2 38.3 BMET showed sodium 138, BUN 48, creatinine 2.37.  creatinine was 1.4 in 04/2017 BNP 314, troponin point of care 2.6, repeat  3.1 Lactic acid 2.1  EKG showed rate 114, tachycardia, nonspecific T-wave changes in inferior leads  Review of Systems: Positives marked in 'bold' Unable to obtain from the patient due to his mental status/dementia  Past Medical History: Past  Medical History:  Diagnosis Date  . Anemia   . Bradycardia   . CAD (coronary artery disease) of bypass graft    Multivessel  . Cataract    bilateral  . CHF (congestive heart failure) (HCC)   . Chronic kidney disease    Stage 3  . Dementia    MMSE 18/30 Feb 2012 [facility]), with behavioral disturbances  . Diabetes mellitus   . Dysphagia   . Hearing deficit   . Hyperlipidemia   . Hypertension   . Neuropathy   . PVD (peripheral vascular disease) (HCC)   . Thrombocytopenia (HCC)     Past Surgical History:  Procedure Laterality Date  . CORONARY ARTERY BYPASS GRAFT    . PERIPHERAL VASCULAR CATHETERIZATION N/A 11/28/2016   Procedure: Abdominal Aortogram w/Lower Extremity;  Surgeon: Maeola Harman, MD;  Location: [facility] INVASIVE CV LAB;  Service: Cardiovascular;  Laterality: N/A;    Medications: Prior to Admission medications   Medication Sig Start Date End Date Taking? Authorizing Provider  acetaminophen (TYLENOL) 650 MG CR tablet Take 650 mg by mouth every 6 (six) hours.   Yes [provider]  aspirin EC 81 MG tablet Take 81 mg by mouth daily.   Yes [provider]  atorvastatin (LIPITOR) 20 MG tablet Take 20 mg by mouth at bedtime.   Yes [provider]  cholecalciferol (VITAMIN D) 1000 UNITS tablet Take 2,000 Units by mouth daily.   Yes [provider]  divalproex (DEPAKOTE SPRINKLE) 125 MG capsule Take 250 mg by mouth 3 (three) times daily.    Yes [provider]  enalapril (VASOTEC) 20 MG tablet Take 20 mg by mouth daily.   Yes [provider]  Eyelid Cleansers (OCUSOFT EYELID CLEANSING EX) Apply to bilateral eye lids topically one time daily for Bilateral Marginal Blepharitis   Yes [provider]  ferrous sulfate 325 (65 FE) MG tablet Take 325 mg by mouth daily with breakfast.   Yes [provider]  gabapentin (NEURONTIN) 100 MG capsule Take 200 mg by mouth at bedtime.   Yes [provider]    hydrALAZINE (APRESOLINE) 50 MG tablet Take 50 mg by mouth every 8 (eight) hours.   Yes [provider]  insulin aspart (NOVOLOG) 100 UNIT/ML injection Inject as per sliding scale: if  0-59=0 Call MD ; 60-69 = 0 units, 70-150= 10; 151-450 = 13 units; 450 + = 0 unitsCall MD for CBG < 60 or > 450, subcutaneously 3 times daily after meals related to DM.   Yes [provider]  insulin glargine (LANTUS) 100 UNIT/ML injection Inject 8 Units into the skin at bedtime.    Yes [provider]  ipratropium-albuterol (DUONEB) 0.5-2.5 (3) MG/3ML SOLN Take 3 mLs by nebulization every 6 (six) hours as needed.   Yes [provider]  levofloxacin (LEVAQUIN) 750 MG tablet Take 750 mg by mouth daily. 08/16/17 08/30/17 Yes [provider]  linagliptin (TRADJENTA) 5 MG TABS tablet Take 5 mg by mouth daily.   Yes [provider]  Memantine HCl-Donepezil HCl (NAMZARIC) 28-10 MG CP24 Take 1 capsule by mouth at bedtime.    Yes [provider]  saccharomyces boulardii (FLORASTOR) 250 MG capsule Take 250 mg by mouth 2 (two) times daily.   Yes [provider]  sulfacetamide (BLEPH-10) 10 % ophthalmic solution Place 1 drop into both eyes 4 (four) times daily.   Yes [provider]    Allergies:  No Known Allergies  Social History: Per sister at the bedside, reports that he quit smoking about 68 years ago. He has never used smokeless tobacco. He reports that he does not drink alcohol or use drugs.  Family History: Family History  Problem Relation Age of Onset  . Diabetes Mother   . Dementia Mother        alzheimer  . Dementia Father   . Stroke Father   . Heart disease Father   . Dementia Sister     Physical Exam: Blood pressure (!) 159/69, pulse 95, temperature (!) 100.9 F (38.3 C), temperature source Rectal, resp. rate (!) 21, SpO2 97 %. General: Alert, awake, oriented to self, on BiPAP, in moderate distress Eyes: pink  conjunctiva,anicteric sclera, pupils equal and reactive to light and accomodation, HEENT: normocephalic, atraumatic, on Bipap Neck: on Bipap CVS: Regular rate and rhythm, tachycardia Resp : Diffuse bilateral rhonchi with wheezing GI : Soft, nontender, nondistended, positive bowel sounds, no masses. No hepatomegaly. No hernia.  Musculoskeletal: No clubbing or cyanosis, positive pedal pulses. No contracture. ROM intact  Neuro: moving all 4 extremities Psych: dementia, oriented to self Skin: no rashes or lesions, warm and dry   LABS on Admission: I have personally reviewed all the labs and imagings below    Basic Metabolic Panel:  Recent Labs Lab 08/17/17 1445  NA 138  K 4.8  CL 108  CO2 18*  GLUCOSE 293*  BUN 48*  CREATININE 2.37*  CALCIUM 7.9*   Liver Function Tests: No results for input(s): AST, ALT, ALKPHOS, BILITOT, PROT, ALBUMIN in the last 168 hours. No results for input(s): LIPASE, AMYLASE in the last 168 hours. No results for input(s): AMMONIA in the last 168 hours. CBC:  Recent Labs Lab 08/17/17 1445  WBC 5.4  HGB 10.2*  HCT 32.6*  MCV 97.9  PLT 130*   Cardiac Enzymes:  Recent Labs Lab 08/17/17 1539  TROPONINI 3.10*   BNP: Invalid input(s): POCBNP CBG: No results for input(s): GLUCAP in the last 168 hours.  Radiological Exams on Admission:  Dg Chest Portable 1 View  Result Date: 08/17/2017 CLINICAL DATA:  SOB today. Pt here from nursing home. On BiPAP at time of imaging. Hx of HTN, CHF, DM, CAD, CKD. EXAM: PORTABLE CHEST 1 VIEW COMPARISON:  11/03/2015 FINDINGS: Status post median sternotomy and CABG. The heart is enlarged. There are no focal consolidations or pleural effusions. No pulmonary edema. IMPRESSION: Stable cardiomegaly. Electronically Signed   By: Norva Pavlov M.D.   On: 08/17/2017 15:25      EKG: Independently reviewed. EKG showed rate 114, tachycardia, nonspecific T-wave changes in inferior leads   Assessment/Plan Principal  Problem:   Acute on chronic respiratory failure with hypoxia (HCC) with clinical pneumonia, acute bronchitis: Presented with hypoxia, diffuse wheezing and rhonchi - Admit to stepdown on BiPAP, obtain influenza panel PCR, urine legionella antigen, urine strep antigen, sputum culture, blood cultures - UA cloudy, small leukocytes, follow urine cultures. Chest x-ray does not show significant pneumonia however elevated lactic acid and febrile, diffuse rhonchi - Place on IV vancomycin, Zosyn, IV Solu-Medrol, scheduled bronchodilators, flutter valve - SLP evaluation   Active Problems:   Sepsis [facility]):  - Patient meets sepsis criteria with fever, tachycardia, tachypnea, acute respiratory failure source likely respiratory. 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  - Does not appear to be in fluid overload, chest x-ray does not show any pleural effusions, no pulmonary edema    Dementia - Continue donepezil, memantine   DVT prophylaxis: heparin subcutaneous   CODE STATUS:  full CODE STATUS, discussed with patient's sister at  the bedside who is HPOA   Consults called:  cardiology called by EDP   Family Communication: Admission, patients condition and plan of care including tests being ordered have been discussed with the patient andsister  who indicates understanding and agree with the plan and Code Status  Admission status:  inpatient, stepdown   Disposition plan: Further plan will depend as patient's clinical course evolves and further radiologic and laboratory data become  available.    At the time of admission, it appears that the appropriate admission status for this patient is INPATIENT . This is judged to be reasonable and necessary in order to provide the required intensity of service to ensure the patient's safety given the presenting symptoms of acute respiratory distress, hypoxia on BiPAP diffuse rhonchi , physical exam findings, and initial radiographic and laboratory data in the context of their chronic comorbidities.  The medical decision making on this patient was of high complexity and the patient is at high risk for clinical deterioration.  Critical care time spent on Admission, discussing with patient's sister.     Ripudeep Rai M.D. [facility] 08/17/2017, 5:42 PM Pager: [phone]  If 7PM-7AM, please contact night-coverage [email or web address] Password [email or web address]
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
## 2017-08-18 ENCOUNTER — Inpatient Hospital Stay (HOSPITAL_COMMUNITY): Payer: Medicare Other

## 2017-08-18 ENCOUNTER — Encounter (HOSPITAL_COMMUNITY): Payer: Self-pay | Admitting: *Deleted

## 2017-08-18 DIAGNOSIS — I214 Non-ST elevation (NSTEMI) myocardial infarction: Secondary | ICD-10-CM

## 2017-08-18 DIAGNOSIS — Z515 Encounter for palliative care: Secondary | ICD-10-CM

## 2017-08-18 LAB — BLOOD CULTURE ID PANEL (REFLEXED)
Acinetobacter baumannii: NOT DETECTED
CANDIDA ALBICANS: NOT DETECTED
CANDIDA PARAPSILOSIS: NOT DETECTED
CANDIDA TROPICALIS: NOT DETECTED
Candida glabrata: NOT DETECTED
Candida krusei: NOT DETECTED
ENTEROBACTERIACEAE SPECIES: NOT DETECTED
Enterobacter cloacae complex: NOT DETECTED
Enterococcus species: NOT DETECTED
Escherichia coli: NOT DETECTED
HAEMOPHILUS INFLUENZAE: NOT DETECTED
KLEBSIELLA OXYTOCA: NOT DETECTED
KLEBSIELLA PNEUMONIAE: NOT DETECTED
Listeria monocytogenes: NOT DETECTED
METHICILLIN RESISTANCE: DETECTED — AB
NEISSERIA MENINGITIDIS: NOT DETECTED
PROTEUS SPECIES: NOT DETECTED
Pseudomonas aeruginosa: NOT DETECTED
SERRATIA MARCESCENS: NOT DETECTED
STAPHYLOCOCCUS AUREUS BCID: NOT DETECTED
STAPHYLOCOCCUS SPECIES: DETECTED — AB
STREPTOCOCCUS PYOGENES: NOT DETECTED
Streptococcus agalactiae: NOT DETECTED
Streptococcus pneumoniae: NOT DETECTED
Streptococcus species: NOT DETECTED

## 2017-08-18 LAB — CBC
HCT: 28.2 % — ABNORMAL LOW (ref 39.0–52.0)
HEMOGLOBIN: 9 g/dL — AB (ref 13.0–17.0)
MCH: 31.3 pg (ref 26.0–34.0)
MCHC: 31.9 g/dL (ref 30.0–36.0)
MCV: 97.9 fL (ref 78.0–100.0)
PLATELETS: 104 10*3/uL — AB (ref 150–400)
RBC: 2.88 MIL/uL — ABNORMAL LOW (ref 4.22–5.81)
RDW: 14 % (ref 11.5–15.5)
WBC: 3.8 10*3/uL — ABNORMAL LOW (ref 4.0–10.5)

## 2017-08-18 LAB — GLUCOSE, CAPILLARY
GLUCOSE-CAPILLARY: 362 mg/dL — AB (ref 65–99)
GLUCOSE-CAPILLARY: 371 mg/dL — AB (ref 65–99)
GLUCOSE-CAPILLARY: 356 mg/dL — AB (ref 65–99)
Glucose-Capillary: 321 mg/dL — ABNORMAL HIGH (ref 65–99)

## 2017-08-18 LAB — ECHOCARDIOGRAM COMPLETE
AV Area VTI: 2.1 cm2
AV Mean grad: 9 mmHg
AV Peak grad: 16 mmHg
AV area mean vel ind: 0.95 cm2/m2
AV peak Index: 0.98
AVAREAMEANV: 2.03 cm2
AVAREAVTIIND: 0.95 cm2/m2
AVCELMEANRAT: 0.49
AVLVOTPG: 4 mmHg
AVPKVEL: 198 cm/s
Ao pk vel: 0.51 m/s
CHL CUP AV VEL: 2.04
CHL CUP DOP CALC LVOT VTI: 23.4 cm
CHL CUP RV SYS PRESS: 22 mmHg
DOP CAL AO MEAN VELOCITY: 145 cm/s
EERAT: 13.43
EWDT: 201 ms
FS: 31 % (ref 28–44)
HEIGHTINCHES: 70 in
IVS/LV PW RATIO, ED: 1.26
LA diam index: 1.96 cm/m2
LA vol A4C: 67.8 ml
LA vol index: 47.7 mL/m2
LA vol: 102 mL
LASIZE: 42 mm
LDCA: 4.15 cm2
LEFT ATRIUM END SYS DIAM: 42 mm
LV E/e' medial: 13.43
LV E/e'average: 13.43
LV TDI E'LATERAL: 6.64
LV TDI E'MEDIAL: 6.42
LV e' LATERAL: 6.64 cm/s
LVOT SV: 97 mL
LVOT diameter: 23 mm
LVOT peak VTI: 0.49 cm
LVOTPV: 100 cm/s
MV Dec: 201
MV pk A vel: 114 m/s
MVPG: 3 mmHg
MVPKEVEL: 89.2 m/s
PW: 13.5 mm — AB (ref 0.6–1.1)
RV LATERAL S' VELOCITY: 9.03 cm/s
Reg peak vel: 216 cm/s
TAPSE: 19.7 mm
TR max vel: 216 cm/s
VTI: 47.6 cm
Valve area index: 0.95
Valve area: 2.04 cm2
WEIGHTICAEL: 3375.68 [oz_av]

## 2017-08-18 LAB — BRAIN NATRIURETIC PEPTIDE: B Natriuretic Peptide: 277.9 pg/mL — ABNORMAL HIGH (ref 0.0–100.0)

## 2017-08-18 LAB — BASIC METABOLIC PANEL
ANION GAP: 9 (ref 5–15)
BUN: 51 mg/dL — ABNORMAL HIGH (ref 6–20)
CALCIUM: 7.1 mg/dL — AB (ref 8.9–10.3)
CO2: 19 mmol/L — AB (ref 22–32)
Chloride: 108 mmol/L (ref 101–111)
Creatinine, Ser: 2.35 mg/dL — ABNORMAL HIGH (ref 0.61–1.24)
GFR calc non Af Amer: 24 mL/min — ABNORMAL LOW (ref >60)
GFR, EST AFRICAN AMERICAN: 28 mL/min — AB (ref >60)
GLUCOSE: 373 mg/dL — AB (ref 65–99)
Potassium: 5.3 mmol/L — ABNORMAL HIGH (ref 3.5–5.1)
Sodium: 136 mmol/L (ref 135–145)

## 2017-08-18 LAB — URINE CULTURE: Culture: NO GROWTH

## 2017-08-18 LAB — HEPARIN LEVEL (UNFRACTIONATED)
HEPARIN UNFRACTIONATED: 0.33 [IU]/mL (ref 0.30–0.70)
Heparin Unfractionated: 0.25 IU/mL — ABNORMAL LOW (ref 0.30–0.70)

## 2017-08-18 LAB — STREP PNEUMONIAE URINARY ANTIGEN: Strep Pneumo Urinary Antigen: NEGATIVE

## 2017-08-18 LAB — HEMOGLOBIN A1C
Hgb A1c MFr Bld: 7.1 % — ABNORMAL HIGH (ref 4.8–5.6)
MEAN PLASMA GLUCOSE: 157.07 mg/dL

## 2017-08-18 LAB — TROPONIN I
TROPONIN I: 1.68 ng/mL — AB (ref <0.03)
Troponin I: 3.18 ng/mL (ref <0.03)

## 2017-08-18 LAB — HIV ANTIBODY (ROUTINE TESTING W REFLEX): HIV Screen 4th Generation wRfx: NONREACTIVE

## 2017-08-18 MED ORDER — INSULIN ASPART 100 UNIT/ML ~~LOC~~ SOLN
3.0000 [IU] | Freq: Three times a day (TID) | SUBCUTANEOUS | Status: DC
  Administered 2017-08-18 (×2): 3 [IU] via SUBCUTANEOUS

## 2017-08-18 MED ORDER — ARFORMOTEROL TARTRATE 15 MCG/2ML IN NEBU
15.0000 ug | INHALATION_SOLUTION | Freq: Two times a day (BID) | RESPIRATORY_TRACT | Status: DC
  Administered 2017-08-18 – 2017-08-27 (×18): 15 ug via RESPIRATORY_TRACT
  Filled 2017-08-18 (×18): qty 2

## 2017-08-18 MED ORDER — BUDESONIDE 0.25 MG/2ML IN SUSP
0.2500 mg | Freq: Two times a day (BID) | RESPIRATORY_TRACT | Status: DC
  Administered 2017-08-18 – 2017-08-27 (×18): 0.25 mg via RESPIRATORY_TRACT
  Filled 2017-08-18 (×18): qty 2

## 2017-08-18 MED ORDER — FUROSEMIDE 10 MG/ML IJ SOLN
40.0000 mg | Freq: Once | INTRAMUSCULAR | Status: AC
  Administered 2017-08-18: 40 mg via INTRAVENOUS
  Filled 2017-08-18: qty 4

## 2017-08-18 MED ORDER — FUROSEMIDE 10 MG/ML IJ SOLN
40.0000 mg | Freq: Every day | INTRAMUSCULAR | Status: DC
  Administered 2017-08-19 – 2017-08-20 (×2): 40 mg via INTRAVENOUS
  Filled 2017-08-18 (×2): qty 4

## 2017-08-18 MED ORDER — INSULIN GLARGINE 100 UNIT/ML ~~LOC~~ SOLN
12.0000 [IU] | Freq: Every day | SUBCUTANEOUS | Status: DC
  Administered 2017-08-18: 12 [IU] via SUBCUTANEOUS
  Filled 2017-08-18: qty 0.12

## 2017-08-18 MED ORDER — RESOURCE THICKENUP CLEAR PO POWD
Freq: Once | ORAL | Status: AC
  Administered 2017-08-18: 16:00:00 via ORAL
  Filled 2017-08-18: qty 125

## 2017-08-18 MED ORDER — TECHNETIUM TO 99M ALBUMIN AGGREGATED
4.2900 | Freq: Once | INTRAVENOUS | Status: AC | PRN
  Administered 2017-08-18: 4.29 via INTRAVENOUS

## 2017-08-18 MED ORDER — INSULIN ASPART 100 UNIT/ML ~~LOC~~ SOLN
0.0000 [IU] | Freq: Three times a day (TID) | SUBCUTANEOUS | Status: DC
  Administered 2017-08-18 (×2): 15 [IU] via SUBCUTANEOUS
  Administered 2017-08-19: 11 [IU] via SUBCUTANEOUS
  Administered 2017-08-19 – 2017-08-20 (×5): 15 [IU] via SUBCUTANEOUS
  Administered 2017-08-21 – 2017-08-22 (×4): 11 [IU] via SUBCUTANEOUS
  Administered 2017-08-22 (×2): 8 [IU] via SUBCUTANEOUS
  Administered 2017-08-23: 15 [IU] via SUBCUTANEOUS
  Administered 2017-08-23: 8 [IU] via SUBCUTANEOUS
  Administered 2017-08-23: 3 [IU] via SUBCUTANEOUS
  Administered 2017-08-24: 8 [IU] via SUBCUTANEOUS

## 2017-08-18 MED ORDER — TECHNETIUM TC 99M DIETHYLENETRIAME-PENTAACETIC ACID
31.7000 | Freq: Once | INTRAVENOUS | Status: AC | PRN
  Administered 2017-08-18: 31.7 via RESPIRATORY_TRACT

## 2017-08-18 NOTE — Progress Notes (Signed)
Consult received for goals of care. Called pt's sister, Alfonso Ellis at [phone] to arrange family mtg. Left message. Palliative medicine to continue to reach out to family. Thank you, Eduard Roux, ANP

## 2017-08-18 NOTE — Progress Notes (Signed)
   The patient was seen by the [facility] fellow overnight but is followed by Dr. Algie Coffer. Dr. Algie Coffer has been made aware and was added to the care team.   Signed, Ellsworth Lennox, PA-C 08/18/2017, 3:44 PM

## 2017-08-18 NOTE — Progress Notes (Signed)
  Echocardiogram 2D Echocardiogram has been performed.  Roosvelt Maser F 08/18/2017, 2:33 PM

## 2017-08-18 NOTE — Progress Notes (Signed)
ANTICOAGULATION CONSULT NOTE - Follow Up Consult  Pharmacy Consult for Heparin  Indication: chest pain/ACS  No Known Allergies  Patient Measurements: Height:  (177.8 cm) Weight: 210 lb 15.7 oz (95.7 kg) IBW/kg (Calculated) : 73  Vital Signs: Temp: 98 F (36.7 C) (09/30 1100) Temp Source: Axillary (09/30 1100) BP: 134/63 (09/30 1100) Pulse Rate: 90 (09/30 1100)  Labs:  Recent Labs  08/17/17 1445  08/17/17 1539 08/17/17 2015 08/18/17 0217 08/18/17 0936 08/18/17 1123  HGB 10.2*  --   --   --  9.0*  --   --   HCT 32.6*  --   --   --  28.2*  --   --   PLT 130*  --   --   --  104*  --   --   APTT  --   --  38*  --   --   --   --   LABPROT  --   --  14.7  --   --   --   --   INR  --   --  1.16  --   --   --   --   HEPARINUNFRC  --   --   --   --  0.33  --  0.25*  CREATININE 2.37*  --   --   --  2.35*  --   --   TROPONINI  --   < > 3.10* 4.24* 3.18* 1.68*  --   < > = values in this interval not displayed.  Estimated Creatinine Clearance: 27.7 mL/min (A) (by C-G formula based on SCr of 2.35 mg/dL (H)).  Assessment: Heparin for NSTEMI, repeat heparin level low at 0.25. No issues with the IV line or bleeding noted. Hgb down slightly 10.2>9 this morning, plt count also down to 104.   Goal of Therapy:  Heparin level 0.3-0.7 units/ml Monitor platelets by anticoagulation protocol: Yes   Plan:  Increase heparin to 1250 units/hr Recheck level with am labs  Sheppard Coil PharmD., BCPS Clinical Pharmacist Pager [phone] 08/18/2017 12:15 PM

## 2017-08-18 NOTE — Progress Notes (Signed)
ANTICOAGULATION CONSULT NOTE - Follow Up Consult  Pharmacy Consult for Heparin  Indication: chest pain/ACS  No Known Allergies  Patient Measurements: Height:  (177.8 cm) Weight: 210 lb 15.7 oz (95.7 kg) IBW/kg (Calculated) : 73  Vital Signs: Temp: 97.3 F (36.3 C) (09/29 2339) Temp Source: Axillary (09/29 2339) BP: 124/44 (09/30 0200) Pulse Rate: 65 (09/30 0200)  Labs:  Recent Labs  08/17/17 1445 08/17/17 1539 08/17/17 2015 08/18/17 0217  HGB 10.2*  --   --  9.0*  HCT 32.6*  --   --  28.2*  PLT 130*  --   --  PENDING  APTT  --  38*  --   --   LABPROT  --  14.7  --   --   INR  --  1.16  --   --   HEPARINUNFRC  --   --   --  0.33  CREATININE 2.37*  --   --   --   TROPONINI  --  3.10* 4.24*  --     Estimated Creatinine Clearance: 27.4 mL/min (A) (by C-G formula based on SCr of 2.37 mg/dL (H)).  Assessment: Heparin for NSTEMI, initial heparin level therapeutic, cardiology consult in place  Goal of Therapy:  Heparin level 0.3-0.7 units/ml Monitor platelets by anticoagulation protocol: Yes   Plan:  -Cont heparin at 1100 units/hr -1200 HL  Abran Duke 08/18/2017,3:04 AM

## 2017-08-18 NOTE — Progress Notes (Signed)
[facility]                                                                              Patient Demographics  Travis Coleman, is a 83 y.o. male, DOB - 1932/07/20, MWN:[id number]  Admit date - 08/17/2017   Admitting Physician Bee Marchiano Krystal Eaton, MD  Outpatient Primary MD for the patient is [name], MD  Outpatient specialists:   LOS - 1  days   Medical records reviewed and are as summarized below:    Chief Complaint  Patient presents with  . Shortness of Breath       Brief summary    Patient is a 83 year old male with history of CAD status post CABG, CHF, chronic kidney disease stage III, advanced dementia, hypertension, hyperlipidemia, PVD presented from [facility] for shortness of breath. History was obtained from the ER records and patient's sister at the bedside. Per patient's sister, patient had shortness of breath in the last 2-3 days at the facility and patient was placed on nebulizer treatments, Levaquin, lasix. On the day of admission, EMS was called for acute respiratory distress and hypoxia, was placed on BiPAP in ED. Patient was noted to have diffuse rhonchi and wheezing. At baseline, patient is wheelchair bound, does not ambulate.  Assessment & Plan    Principal Problem:   Acute on chronic respiratory failure with hypoxia (HCC) with clinical pneumonia/HCAP, acute bronchitis, presented with acute respiratory distress, wheezing - BiPAP currently off, influenza panel negative, respiratory virus panel pending,urine strep antigen pending, urine legionella antigen pending - blood cultures 1/2  positive for GPC. BCID showed methicillin resistant coagulase negative staph, possible contaminant,  currently on antibiotics - continue IV antibiotics empirically, IV Solu-Medrol, scheduled bronchodilators, flutter valve - Add Pulmicort, Brovana. SLP evaluation - diffuse rhonchi and wheezing, stat chest x-ray showed cardiomegaly with pulmonary  vascular condition, possible mild interstitial edema, gave Lasix 40 mg IV 1 - VQ scan pending  Active Problems:   Sepsis ([place]) - patient met sepsis criteria on admission with fever, tachycardia, tachypnea, acute respiratory failure likely source respiratory,, UA? UTI - follow urine culture and sensitivities, blood cultures 1/2  positive for GPC. 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 If no significant improvement, obtain renal ultrasound in a.m   Code Status: full CODE STATUS DVT Prophylaxis:  Heparin subcutaneous Family Communication: Discussed in detail  with the patient, all imaging results, lab results explained to the patient   Disposition Plan:  Time Spent in minutes 35 minutes  Procedures:  BiPAP  Consultants:   none  Antimicrobials:   IV vancomycin 9/29  IV Zosyn 9/29   Medications  Scheduled Meds: . arformoterol  15 mcg Nebulization BID  . aspirin EC  81 mg Oral Daily  . atorvastatin  20 mg Oral QHS  . budesonide (PULMICORT) nebulizer solution  0.25 mg Nebulization BID  . cholecalciferol  2,000 Units Oral Daily    . divalproex  250 mg Oral TID  . memantine  28 mg Oral QHS   And  . donepezil  10 mg Oral QHS  . ferrous sulfate  325 mg Oral Q breakfast  . furosemide  40 mg Intravenous Once  . [START ON 08/19/2017] furosemide  40 mg Intravenous Daily  . gabapentin  200 mg Oral QHS  . hydrALAZINE  50 mg Oral Q8H  . insulin aspart  0-15 Units Subcutaneous TID WC  . insulin aspart  0-5 Units Subcutaneous QHS  . insulin aspart  3 Units Subcutaneous TID WC  . insulin glargine  12 Units Subcutaneous QHS  . ipratropium  0.5 mg Nebulization TID   And  . levalbuterol  0.63 mg Nebulization TID  . methylPREDNISolone (SOLU-MEDROL) injection  60 mg Intravenous Q6H  . saccharomyces boulardii  250 mg Oral BID  . sulfacetamide  1 drop Both Eyes QID   Continuous Infusions: . heparin 1,100 Units/hr (08/18/17 0928)  . piperacillin-tazobactam (ZOSYN)  IV Stopped (08/18/17 1119)  . vancomycin     PRN Meds:.acetaminophen, levalbuterol   Antibiotics   Anti-infectives    Start     Dose/Rate Route Frequency Ordered Stop   08/18/17 1600  vancomycin (VANCOCIN) IVPB 1000 mg/200 mL premix     1,000 mg 200 mL/hr over 60 Minutes Intravenous Every 24 hours 08/17/17 1631     08/17/17 2345  piperacillin-tazobactam (ZOSYN) IVPB 3.375 g     3.375 g 12.5 mL/hr over 240 Minutes Intravenous Every 8 hours 08/17/17 1631     08/17/17 1545  piperacillin-tazobactam (ZOSYN) IVPB 3.375 g     3.375 g 100 mL/hr over 30 Minutes Intravenous  Once 08/17/17 1530 08/17/17 1616   08/17/17 1545  vancomycin (VANCOCIN) IVPB 1000 mg/200 mL premix     1,000 mg 200 mL/hr over 60 Minutes Intravenous  Once 08/17/17 1530 08/17/17 1657        Subjective:   Taariq Leitz was seen and examined today. Alert and awake today, off BiPAP, diffuse audible rhonchi and wheezing. No fevers or chills. Patient denies dizziness, chest pain, abdominal pain, N/V/D/C, new weakness, numbess, tingling.   Objective:   Vitals:   08/18/17 0400 08/18/17 0500  08/18/17 0651 08/18/17 1008  BP: (!) 148/61 (!) 148/56 (!) 142/59   Pulse: 83 82    Resp: 19 (!) 21    Temp:      TempSrc:      SpO2:    96%  Weight:      Height:        Intake/Output Summary (Last 24 hours) at 08/18/17 1121 Last data filed at 08/18/17 1001  Gross per 24 hour  Intake          2631.44 ml  Output              850 ml  Net          1781.44 ml     Wt  Readings from Last 3 Encounters:  08/17/17 95.7 kg (210 lb 15.7 oz)  08/16/17 95 kg (209 lb 6.4 oz)  08/15/17 95 kg (209 lb 6.4 oz)     Exam  General: Alert and oriented x 1, NAD  Eyes:  HEENT:  Atraumatic, normocephalic  Cardiovascular: S1 S2 auscultated, no rubs, murmurs or gallops. Regular rate and rhythm.  Respiratory: diffuse bilateral rhonchi and wheezing  Gastrointestinal: Soft, nontender, nondistended, + bowel sounds  Ext: no pedal edema bilaterally  Neuro: no new deficits  Musculoskeletal: No digital cyanosis, clubbing  Skin: No rashes  Psych: Normal affect and demeanor, alert and oriented x1, self   Data Reviewed:  I have personally reviewed following labs and imaging studies  Micro Results Recent Results (from the past 240 hour(s))  Blood Culture (routine x 2)     Status: None (Preliminary result)   Collection Time: 08/17/17  2:45 PM  Result Value Ref Range Status   Specimen Description BLOOD RIGHT HAND  Final   Special Requests   Final    BOTTLES DRAWN AEROBIC AND ANAEROBIC Blood Culture adequate volume   Culture  Setup Time   Final    GRAM POSITIVE COCCI IN CLUSTERS IN BOTH AEROBIC AND ANAEROBIC BOTTLES CRITICAL RESULT CALLED TO, READ BACK BY AND VERIFIED WITH: M MACCIA,PHARMD AT 1113 08/18/17 BY L BENFIELD    Culture GRAM POSITIVE COCCI  Final   Report Status PENDING  Incomplete  Blood Culture ID Panel (Reflexed)     Status: Abnormal   Collection Time: 08/17/17  2:45 PM  Result Value Ref Range Status   Enterococcus species NOT DETECTED NOT DETECTED Final   Listeria  monocytogenes NOT DETECTED NOT DETECTED Final   Staphylococcus species DETECTED (A) NOT DETECTED Final    Comment: Methicillin (oxacillin) resistant coagulase negative staphylococcus. Possible blood culture contaminant (unless isolated from more than one blood culture draw or clinical case suggests pathogenicity). No antibiotic treatment is indicated for blood  culture contaminants. CRITICAL RESULT CALLED TO, READ BACK BY AND VERIFIED WITH: M MACCIA,PHARMD AT 1113 08/18/17 BY L BENFIELD    Staphylococcus aureus NOT DETECTED NOT DETECTED Final   Methicillin resistance DETECTED (A) NOT DETECTED Final    Comment: CRITICAL RESULT CALLED TO, READ BACK BY AND VERIFIED WITH: M MACCIA,PHARMD AT 1113 08/18/17 BY L BENFIELD    Streptococcus species NOT DETECTED NOT DETECTED Final   Streptococcus agalactiae NOT DETECTED NOT DETECTED Final   Streptococcus pneumoniae NOT DETECTED NOT DETECTED Final   Streptococcus pyogenes NOT DETECTED NOT DETECTED Final   Acinetobacter baumannii NOT DETECTED NOT DETECTED Final   Enterobacteriaceae species NOT DETECTED NOT DETECTED Final   Enterobacter cloacae complex NOT DETECTED NOT DETECTED Final   Escherichia coli NOT DETECTED NOT DETECTED Final   Klebsiella oxytoca NOT DETECTED NOT DETECTED Final   Klebsiella pneumoniae NOT DETECTED NOT DETECTED Final   Proteus species NOT DETECTED NOT DETECTED Final   Serratia marcescens NOT DETECTED NOT DETECTED Final   Haemophilus influenzae NOT DETECTED NOT DETECTED Final   Neisseria meningitidis NOT DETECTED NOT DETECTED Final   Pseudomonas aeruginosa NOT DETECTED NOT DETECTED Final   Candida albicans NOT DETECTED NOT DETECTED Final   Candida glabrata NOT DETECTED NOT DETECTED Final   Candida krusei NOT DETECTED NOT DETECTED Final   Candida parapsilosis NOT DETECTED NOT DETECTED Final   Candida tropicalis NOT DETECTED NOT DETECTED Final  MRSA PCR Screening     Status: None   Collection Time: 08/17/17  8:05 PM  Result  Value Ref Range Status   MRSA by PCR NEGATIVE NEGATIVE Final    Comment:        The GeneXpert MRSA Assay (FDA approved for NASAL specimens only), is one component of a comprehensive MRSA colonization surveillance program. It is not intended to diagnose MRSA infection nor to guide or monitor treatment for MRSA infections.     Radiology Reports Nm Pulmonary Perf And Vent  Result Date: 08/18/2017 CLINICAL DATA:  83 year old male with acute shortness of breath for 2-3 days. EXAM: NUCLEAR MEDICINE VENTILATION - PERFUSION LUNG SCAN TECHNIQUE: Ventilation images were obtained in multiple projections using inhaled aerosol Tc-16mDTPA. Perfusion images were obtained in multiple projections after intravenous injection of Tc-979mAA. RADIOPHARMACEUTICALS:  31.7 mCi Technetium-9936mPA aerosol inhalation and 4.2 mCi Technetium-41m35m IV COMPARISON:  08/17/2017 chest radiograph FINDINGS: Ventilation: No focal ventilation defect. Central clumping is present. Perfusion: No wedge shaped peripheral perfusion defects to suggest acute pulmonary embolism. IMPRESSION: No evidence of pulmonary embolism.  No VQ mismatches. Electronically Signed   By: JeffMargarette Canada.   On: 08/18/2017 10:27   Dg Chest Port 1 View  Result Date: 08/18/2017 CLINICAL DATA:  85 y83r old male with shortness of breath. EXAM: PORTABLE CHEST 1 VIEW COMPARISON:  08/17/2017 and prior exams FINDINGS: Cardiomegaly and CABG changes again noted. Pulmonary vascular congestion noted with possible mild interstitial edema. No airspace disease, pleural effusion or pneumothorax. IMPRESSION: Cardiomegaly with pulmonary vascular congestion and possible mild interstitial edema. Electronically Signed   By: JeffMargarette Canada.   On: 08/18/2017 11:07   Dg Chest Portable 1 View  Result Date: 08/17/2017 CLINICAL DATA:  SOB today. Pt here from nursing home. On BiPAP at time of imaging. Hx of HTN, CHF, DM, CAD, CKD. EXAM: PORTABLE CHEST 1 VIEW COMPARISON:   11/03/2015 FINDINGS: Status post median sternotomy and CABG. The heart is enlarged. There are no focal consolidations or pleural effusions. No pulmonary edema. IMPRESSION: Stable cardiomegaly. Electronically Signed   By: ElizNolon Nations.   On: 08/17/2017 15:25    Lab Data:  CBC:  Recent Labs Lab 08/17/17 1445 08/18/17 0217  WBC 5.4 3.8*  HGB 10.2* 9.0*  HCT 32.6* 28.2*  MCV 97.9 97.9  PLT 130* 104*937Basic Metabolic Panel:  Recent Labs Lab 08/17/17 1445 08/18/17 0217  NA 138 136  K 4.8 5.3*  CL 108 108  CO2 18* 19*  GLUCOSE 293* 373*  BUN 48* 51*  CREATININE 2.37* 2.35*  CALCIUM 7.9* 7.1*   GFR: Estimated Creatinine Clearance: 27.7 mL/min (A) (by C-G formula based on SCr of 2.35 mg/dL (H)). Liver Function Tests: No results for input(s): AST, ALT, ALKPHOS, BILITOT, PROT, ALBUMIN in the last 168 hours. No results for input(s): LIPASE, AMYLASE in the last 168 hours. No results for input(s): AMMONIA in the last 168 hours. Coagulation Profile:  Recent Labs Lab 08/17/17 1539  INR 1.16   Cardiac Enzymes:  Recent Labs Lab 08/17/17 1539 08/17/17 2015 08/18/17 0217 08/18/17 0936  TROPONINI 3.10* 4.24* 3.18* 1.68*   BNP (last 3 results) No results for input(s): PROBNP in the last 8760 hours. HbA1C:  Recent Labs  08/17/17 2015  HGBA1C 7.2*   CBG:  Recent Labs Lab 08/17/17 2009 08/18/17 0922  GLUCAP 330* 362*   Lipid Profile: No results for input(s): CHOL, HDL, LDLCALC, TRIG, CHOLHDL, LDLDIRECT in the last 72 hours. Thyroid Function Tests: No results for input(s): TSH, T4TOTAL, FREET4, T3FREE, THYROIDAB in the last 72 hours. Anemia Panel: No results  for input(s): VITAMINB12, FOLATE, FERRITIN, TIBC, IRON, RETICCTPCT in the last 72 hours. Urine analysis:    Component Value Date/Time   COLORURINE YELLOW 08/17/2017 1554   APPEARANCEUR CLOUDY (A) 08/17/2017 1554   LABSPEC 1.017 08/17/2017 1554   PHURINE 5.0 08/17/2017 1554   GLUCOSEU NEGATIVE  08/17/2017 1554   HGBUR NEGATIVE 08/17/2017 [place] 08/17/2017 1554   KETONESUR NEGATIVE 08/17/2017 1554   PROTEINUR >=300 (A) 08/17/2017 1554   UROBILINOGEN 0.2 06/28/2014 2020   NITRITE NEGATIVE 08/17/2017 1554   LEUKOCYTESUR SMALL (A) 08/17/2017 1554     Graycie Halley M.D. [facility] 08/18/2017, 11:21 AM  Pager: [phone] Between 7am to 7pm - call Pager - [phone]  After 7pm go to [email or web address] - password [email or web address]  Call night coverage person covering after 7pm

## 2017-08-18 NOTE — Progress Notes (Signed)
PHARMACY - PHYSICIAN COMMUNICATION CRITICAL VALUE ALERT - BLOOD CULTURE IDENTIFICATION (BCID)  Results for orders placed or performed during the hospital encounter of 08/17/17  Blood Culture ID Panel (Reflexed) (Collected: 08/17/2017  2:45 PM)  Result Value Ref Range   Enterococcus species NOT DETECTED NOT DETECTED   Listeria monocytogenes NOT DETECTED NOT DETECTED   Staphylococcus species DETECTED (A) NOT DETECTED   Staphylococcus aureus NOT DETECTED NOT DETECTED   Methicillin resistance DETECTED (A) NOT DETECTED   Streptococcus species NOT DETECTED NOT DETECTED   Streptococcus agalactiae NOT DETECTED NOT DETECTED   Streptococcus pneumoniae NOT DETECTED NOT DETECTED   Streptococcus pyogenes NOT DETECTED NOT DETECTED   Acinetobacter baumannii NOT DETECTED NOT DETECTED   Enterobacteriaceae species NOT DETECTED NOT DETECTED   Enterobacter cloacae complex NOT DETECTED NOT DETECTED   Escherichia coli NOT DETECTED NOT DETECTED   Klebsiella oxytoca NOT DETECTED NOT DETECTED   Klebsiella pneumoniae NOT DETECTED NOT DETECTED   Proteus species NOT DETECTED NOT DETECTED   Serratia marcescens NOT DETECTED NOT DETECTED   Haemophilus influenzae NOT DETECTED NOT DETECTED   Neisseria meningitidis NOT DETECTED NOT DETECTED   Pseudomonas aeruginosa NOT DETECTED NOT DETECTED   Candida albicans NOT DETECTED NOT DETECTED   Candida glabrata NOT DETECTED NOT DETECTED   Candida krusei NOT DETECTED NOT DETECTED   Candida parapsilosis NOT DETECTED NOT DETECTED   Candida tropicalis NOT DETECTED NOT DETECTED    Name of physician (or Provider) Contacted: Dr Isidoro Donning  Changes to prescribed antibiotics required: None needed likely contaminant   Bertram Millard 08/18/2017  11:18 AM

## 2017-08-18 NOTE — Evaluation (Signed)
Clinical/Bedside Swallow Evaluation Patient Details  Name: Travis Coleman MRN: [id number] Date of Birth: [birth year]  Today's Date: 08/18/2017 Time: SLP Start Time (ACUTE ONLY): 1320 SLP Stop Time (ACUTE ONLY): 1340 SLP Time Calculation (min) (ACUTE ONLY): 20 min  Past Medical History:  Past Medical History:  Diagnosis Date  . Anemia   . Bradycardia   . CAD (coronary artery disease) of bypass graft    Multivessel  . Cataract    bilateral  . CHF (congestive heart failure) (HCC)   . Chronic kidney disease    Stage 3  . Dementia    MMSE 18/30 Feb 2012 [facility]), with behavioral disturbances  . Diabetes mellitus   . Dysphagia   . Hearing deficit   . Hyperlipidemia   . Hypertension   . Neuropathy   . PVD (peripheral vascular disease) (HCC)   . Thrombocytopenia (HCC)    Past Surgical History:  Past Surgical History:  Procedure Laterality Date  . CORONARY ARTERY BYPASS GRAFT    . PERIPHERAL VASCULAR CATHETERIZATION N/A 11/28/2016   Procedure: Abdominal Aortogram w/Lower Extremity;  Surgeon: Maeola Harman, MD;  Location: [facility] INVASIVE CV LAB;  Service: Cardiovascular;  Laterality: N/A;   HPI:  83 year old male who is HOH with history of CAD status post CABG, CHF, chronic kidney disease stage III, advanced dementia, hypertension, hyperlipidemia, PVD presented from [facility] skilled nursing facility for shortness of breath.  Dx Acute on chronic resp failure with clinical pna, sepsis, NSTEMI.     Assessment / Plan / Recommendation Clinical Impression  Pt presents with concerns for aspiration after observed with potential decreased respiratory endurance during meal time.  Swallow assessment completed during lunch - pt self-fed portion of meal, assisted by SLP for additional POs.  Congested with significant wheezing at baseline (improved from earlier today per RN); has transitioned from bipap today to nasal cannula, oxygenating around 93-95% during assessment.  No overt s/s of  aspiration during initial portion of meal, but as meal progressed he began coughing and declined further POs.  Pt quite HOH, impulsive with self-feeding, f/c intermittently.  Given breath sounds, current respiratory condition, recommend changing diet to dysphagia 2/nectar-thick liquids for the interim.  SLP will f/u next date to assess toleration and determine if instrumental swallow study is needed. Discussed plan with RN.  SLP Visit Diagnosis: Dysphagia, unspecified (R13.10)    Aspiration Risk       Diet Recommendation   dysphagia 2, nectars  Medication Administration: Whole meds with liquid    Other  Recommendations Oral Care Recommendations: Oral care BID Other Recommendations: Order thickener from pharmacy   Follow up Recommendations  (tba)      Frequency and Duration min 2x/week  2 weeks       Prognosis Prognosis for Safe Diet Advancement: Fair      Swallow Study   General Date of Onset: 08/17/17 HPI: 83 year old male who is HOH with history of CAD status post CABG, CHF, chronic kidney disease stage III, advanced dementia, hypertension, hyperlipidemia, PVD presented from [facility] skilled nursing facility for shortness of breath.  Dx Acute on chronic resp failure with clinical pna, sepsis, NSTEMI.   Type of Study: Bedside Swallow Evaluation Previous Swallow Assessment: 2015 normal findings per BSE Diet Prior to this Study: Other (Comment) (soft diet) Temperature Spikes Noted: No Respiratory Status: Nasal cannula History of Recent Intubation: No Behavior/Cognition: Alert;Confused Oral Cavity Assessment: Dry Oral Care Completed by SLP: Recent completion by staff Oral Cavity - Dentition: Missing dentition Self-Feeding Abilities: Needs  assist Patient Positioning: Upright in bed Baseline Vocal Quality: Wet Volitional Cough: Cognitively unable to elicit Volitional Swallow: Unable to elicit    Oral/Motor/Sensory Function Overall Oral Motor/Sensory Function: Within  functional limits   Ice Chips Ice chips: Within functional limits   Thin Liquid Thin Liquid: Impaired Presentation: Cup;Straw Pharyngeal  Phase Impairments: Cough - Delayed    Nectar Thick Nectar Thick Liquid: Within functional limits Presentation: Cup   Honey Thick Honey Thick Liquid: Not tested   Puree Puree: Within functional limits   Solid   GO   Solid:  (prolonged mastication of steak)        Anglea Gordner Laurice 08/18/2017,1:53 PM   Travis Coleman, [place] CCC/SLP Pager [phone]

## 2017-08-19 ENCOUNTER — Inpatient Hospital Stay (HOSPITAL_COMMUNITY): Payer: Medicare Other

## 2017-08-19 DIAGNOSIS — N179 Acute kidney failure, unspecified: Secondary | ICD-10-CM

## 2017-08-19 LAB — RESPIRATORY PANEL BY PCR
ADENOVIRUS-RVPPCR: NOT DETECTED
Bordetella pertussis: NOT DETECTED
CORONAVIRUS HKU1-RVPPCR: NOT DETECTED
CORONAVIRUS NL63-RVPPCR: NOT DETECTED
CORONAVIRUS OC43-RVPPCR: NOT DETECTED
Chlamydophila pneumoniae: NOT DETECTED
Coronavirus 229E: NOT DETECTED
Influenza A: NOT DETECTED
Influenza B: NOT DETECTED
METAPNEUMOVIRUS-RVPPCR: NOT DETECTED
Mycoplasma pneumoniae: NOT DETECTED
PARAINFLUENZA VIRUS 1-RVPPCR: NOT DETECTED
PARAINFLUENZA VIRUS 2-RVPPCR: NOT DETECTED
PARAINFLUENZA VIRUS 3-RVPPCR: NOT DETECTED
Parainfluenza Virus 4: NOT DETECTED
RHINOVIRUS / ENTEROVIRUS - RVPPCR: DETECTED — AB
Respiratory Syncytial Virus: NOT DETECTED

## 2017-08-19 LAB — BASIC METABOLIC PANEL
Anion gap: 10 (ref 5–15)
BUN: 64 mg/dL — ABNORMAL HIGH (ref 6–20)
CHLORIDE: 107 mmol/L (ref 101–111)
CO2: 18 mmol/L — AB (ref 22–32)
Calcium: 7.2 mg/dL — ABNORMAL LOW (ref 8.9–10.3)
Creatinine, Ser: 2.44 mg/dL — ABNORMAL HIGH (ref 0.61–1.24)
GFR calc non Af Amer: 23 mL/min — ABNORMAL LOW (ref >60)
GFR, EST AFRICAN AMERICAN: 27 mL/min — AB (ref >60)
Glucose, Bld: 365 mg/dL — ABNORMAL HIGH (ref 65–99)
POTASSIUM: 4.5 mmol/L (ref 3.5–5.1)
SODIUM: 135 mmol/L (ref 135–145)

## 2017-08-19 LAB — CBC
HCT: 27.5 % — ABNORMAL LOW (ref 39.0–52.0)
Hemoglobin: 9 g/dL — ABNORMAL LOW (ref 13.0–17.0)
MCH: 31.6 pg (ref 26.0–34.0)
MCHC: 32.7 g/dL (ref 30.0–36.0)
MCV: 96.5 fL (ref 78.0–100.0)
Platelets: 129 10*3/uL — ABNORMAL LOW (ref 150–400)
RBC: 2.85 MIL/uL — ABNORMAL LOW (ref 4.22–5.81)
RDW: 13.8 % (ref 11.5–15.5)
WBC: 6.6 10*3/uL (ref 4.0–10.5)

## 2017-08-19 LAB — URINE CULTURE: Culture: NO GROWTH

## 2017-08-19 LAB — LEGIONELLA PNEUMOPHILA SEROGP 1 UR AG: L. PNEUMOPHILA SEROGP 1 UR AG: NEGATIVE

## 2017-08-19 LAB — GLUCOSE, CAPILLARY
GLUCOSE-CAPILLARY: 424 mg/dL — AB (ref 65–99)
GLUCOSE-CAPILLARY: 438 mg/dL — AB (ref 65–99)
GLUCOSE-CAPILLARY: 387 mg/dL — AB (ref 65–99)
Glucose-Capillary: 308 mg/dL — ABNORMAL HIGH (ref 65–99)

## 2017-08-19 LAB — HEPARIN LEVEL (UNFRACTIONATED): HEPARIN UNFRACTIONATED: 0.41 [IU]/mL (ref 0.30–0.70)

## 2017-08-19 MED ORDER — INSULIN ASPART 100 UNIT/ML ~~LOC~~ SOLN
5.0000 [IU] | Freq: Three times a day (TID) | SUBCUTANEOUS | Status: DC
  Administered 2017-08-19 – 2017-08-20 (×3): 5 [IU] via SUBCUTANEOUS

## 2017-08-19 MED ORDER — ORAL CARE MOUTH RINSE
15.0000 mL | Freq: Two times a day (BID) | OROMUCOSAL | Status: DC
  Administered 2017-08-19 – 2017-08-26 (×15): 15 mL via OROMUCOSAL

## 2017-08-19 MED ORDER — PIPERACILLIN-TAZOBACTAM 3.375 G IVPB
3.3750 g | Freq: Three times a day (TID) | INTRAVENOUS | Status: DC
  Administered 2017-08-19 – 2017-08-21 (×6): 3.375 g via INTRAVENOUS
  Filled 2017-08-19 (×7): qty 50

## 2017-08-19 MED ORDER — METHYLPREDNISOLONE SODIUM SUCC 125 MG IJ SOLR
60.0000 mg | Freq: Two times a day (BID) | INTRAMUSCULAR | Status: DC
  Administered 2017-08-19 – 2017-08-20 (×2): 60 mg via INTRAVENOUS
  Filled 2017-08-19 (×2): qty 2

## 2017-08-19 MED ORDER — INSULIN ASPART 100 UNIT/ML ~~LOC~~ SOLN
11.0000 [IU] | Freq: Once | SUBCUTANEOUS | Status: AC
  Administered 2017-08-19: 11 [IU] via SUBCUTANEOUS

## 2017-08-19 MED ORDER — AMLODIPINE BESYLATE 2.5 MG PO TABS
2.5000 mg | ORAL_TABLET | Freq: Every day | ORAL | Status: DC
  Administered 2017-08-19 – 2017-08-27 (×9): 2.5 mg via ORAL
  Filled 2017-08-19 (×9): qty 1

## 2017-08-19 MED ORDER — INSULIN GLARGINE 100 UNIT/ML ~~LOC~~ SOLN
15.0000 [IU] | Freq: Every day | SUBCUTANEOUS | Status: DC
  Administered 2017-08-19: 15 [IU] via SUBCUTANEOUS
  Filled 2017-08-19: qty 0.15

## 2017-08-19 MED ORDER — INSULIN ASPART 100 UNIT/ML ~~LOC~~ SOLN: 15.0000 [IU] | Freq: Once | SUBCUTANEOUS | Status: DC

## 2017-08-19 NOTE — Progress Notes (Signed)
Pt found after having pulled out right hand IV. Significant bleeding coating gown and parts of bed. MD notified about amount of bleeding. Will continue to monitor.

## 2017-08-19 NOTE — Progress Notes (Signed)
  Speech Language Pathology Treatment: Dysphagia  Patient Details Name: Travis Coleman MRN: [id number] DOB: 1932/07/14 Today's Date: 08/19/2017 Time: 4010-2725 SLP Time Calculation (min) (ACUTE ONLY): 11 min  Assessment / Plan / Recommendation Clinical Impression  Significant wheezing, audible respirations and wet vocal quality noted prior to po's. Intermittent assist needed with feeding. Continued with indications of possible airway entrance of material throughout observation versus baseline respiratory status. MBS recommended to fully assess and scheduled for 10:00 this morning. Pt okay to continue breakfast/meds.    HPI HPI: 83 year old male who is HOH with history of CAD status post CABG, CHF, chronic kidney disease stage III, advanced dementia, hypertension, hyperlipidemia, PVD presented from [facility] skilled nursing facility for shortness of breath.  Dx Acute on chronic resp failure with clinical pna, sepsis, NSTEMI.        SLP Plan  MBS       Recommendations  Diet recommendations: Dysphagia 2 (fine chop);Nectar-thick liquid Liquids provided via: Cup;No straw Medication Administration: Whole meds with puree Supervision: Patient able to self feed;Staff to assist with self feeding;Full supervision/cueing for compensatory strategies Compensations: Minimize environmental distractions;Small sips/bites;Slow rate;Lingual sweep for clearance of pocketing Postural Changes and/or Swallow Maneuvers: Seated upright 90 degrees                Oral Care Recommendations: Oral care BID Follow up Recommendations: Skilled Nursing facility SLP Visit Diagnosis: Dysphagia, unspecified (R13.10) Plan: MBS       GO                Royce Macadamia 08/19/2017, 8:47 AM  Breck Coons Lonell Face.Ed [employer] [phone]

## 2017-08-19 NOTE — [place] FL2 (Signed)
[place] MEDICAID FL2 LEVEL OF CARE SCREENING TOOL     IDENTIFICATION  Patient Name: Travis Coleman Birthdate: 03-15-1932 Sex: male Admission Date (Current Location): 08/17/2017  [facility] [place] Number:  Producer, television/film/video and Address:  The . [facility], [address] [place], [place] [address]      Provider Number: [id number]  Attending Physician Name and Address:  Cathren Harsh, MD  Relative Name and Phone Number:       Current Level of Care: Hospital Recommended Level of Care: Skilled Nursing Facility Prior Approval Number:    Date Approved/Denied:   PASRR Number: [id number]  Discharge Plan: SNF    Current Diagnoses: Patient Active Problem List   Diagnosis Date Noted  . Palliative care encounter   . Acute on chronic respiratory failure with hypoxia (HCC) 08/17/2017  . Sepsis (HCC) 08/17/2017  . Acute respiratory failure (HCC) 08/17/2017  . HCAP (healthcare-associated pneumonia) 08/17/2017  . NSTEMI (non-ST elevated myocardial infarction) (HCC) 08/17/2017  . Chronic kidney disease (CKD), stage III (moderate) (HCC) 01/23/2017  . Hx of CABG 12/25/2016  . 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 Anemia in chronic kidney disease 11/10/2015  . Hypertensive heart disease with CHF (congestive heart failure) (HCC) 11/08/2015  . Chronic systolic CHF (congestive heart failure) (HCC) 11/08/2015  . Vascular dementia without behavioral disturbance 11/08/2015  . Dyslipidemia associated with type 2 diabetes mellitus (HCC) 11/08/2015  . Bradycardia   . PVD (peripheral vascular disease) (HCC) 01/03/2011  . CAD (coronary artery disease) s/p CABG 01/03/2011    Orientation RESPIRATION BLADDER Height & Weight     Self  O2, Other (Comment) (Nasal Canula 2 L. Has not been on bipap since 9/29: 12/8 at 40%.) Incontinent, Indwelling catheter Weight: 210 lb 15.7 oz (95.7 kg) Height:   (177.8 cm)  BEHAVIORAL  SYMPTOMS/MOOD NEUROLOGICAL BOWEL NUTRITION STATUS  Other (Comment) (Irritable)  (Vascular dementia without behavioral disturbance) Incontinent Diet (DYS 2, Fluid nectar thick)  AMBULATORY STATUS COMMUNICATION OF NEEDS Skin     Verbally Normal                       Personal Care Assistance Level of Assistance              Functional Limitations Info  Sight, Hearing, Speech Sight Info: Adequate Hearing Info: Impaired Speech Info: Adequate    SPECIAL CARE FACTORS FREQUENCY  Speech therapy             Speech Therapy Frequency: 5 x week      Contractures Contractures Info: Not present    Additional Factors Info  Code Status, Allergies, Isolation Precautions Code Status Info: Full Allergies Info: NKDA     Isolation Precautions Info: Droplet     Current Medications (08/19/2017):  This is the current hospital active medication list Current Facility-Administered Medications  Medication Dose Route Frequency Provider Last Rate Last Dose  . acetaminophen (TYLENOL) tablet 650 mg  650 mg Oral Q6H PRN Rai, Ripudeep K, MD      . amLODipine (NORVASC) tablet 2.5 mg  2.5 mg Oral Daily [name], MD   2.5 mg at 08/19/17 1038  . arformoterol (BROVANA) nebulizer solution 15 mcg  15 mcg Nebulization BID Rai, Ripudeep K, MD   15 mcg at 08/19/17 0815  . aspirin EC tablet 81 mg  81 mg Oral Daily Rai, Ripudeep K, MD   81 mg at 08/19/17 0810  .  atorvastatin (LIPITOR) tablet 20 mg  20 mg Oral QHS Rai, Ripudeep K, MD   20 mg at 08/18/17 2250  . budesonide (PULMICORT) nebulizer solution 0.25 mg  0.25 mg Nebulization BID Rai, Ripudeep K, MD   0.25 mg at 08/19/17 0815  . cholecalciferol (VITAMIN D) tablet 2,000 Units  2,000 Units Oral Daily Rai, Ripudeep K, MD   2,000 Units at 08/19/17 0810  . divalproex (DEPAKOTE SPRINKLE) capsule 250 mg  250 mg Oral TID Rai, Ripudeep K, MD   250 mg at 08/19/17 0810  . memantine (NAMENDA XR) 24 hr capsule 28 mg  28 mg Oral QHS Rai, Ripudeep K, MD   28 mg  at 08/18/17 2251   And  . donepezil (ARICEPT) tablet 10 mg  10 mg Oral QHS Rai, Ripudeep K, MD   10 mg at 08/18/17 2251  . ferrous sulfate tablet 325 mg  325 mg Oral Q breakfast Rai, Ripudeep K, MD   325 mg at 08/19/17 0810  . furosemide (LASIX) injection 40 mg  40 mg Intravenous Daily Rai, Ripudeep K, MD   40 mg at 08/19/17 1038  . gabapentin (NEURONTIN) capsule 200 mg  200 mg Oral QHS Rai, Ripudeep K, MD   200 mg at 08/18/17 2251  . heparin ADULT infusion 100 units/mL (25000 units/226mL sodium chloride 0.45%)  1,250 Units/hr Intravenous Continuous Earnie Larsson, RPH 12.5 mL/hr at 08/19/17 0815 1,250 Units/hr at 08/19/17 0815  . hydrALAZINE (APRESOLINE) tablet 50 mg  50 mg Oral Q8H Rai, Ripudeep K, MD   50 mg at 08/19/17 1610  . insulin aspart (novoLOG) injection 0-15 Units  0-15 Units Subcutaneous TID WC Rai, Ripudeep K, MD   15 Units at 08/19/17 1248  . insulin aspart (novoLOG) injection 0-5 Units  0-5 Units Subcutaneous QHS Rai, Ripudeep K, MD   4 Units at 08/18/17 2248  . insulin aspart (novoLOG) injection 5 Units  5 Units Subcutaneous TID WC Rai, Ripudeep K, MD   5 Units at 08/19/17 1247  . insulin glargine (LANTUS) injection 15 Units  15 Units Subcutaneous QHS Rai, Ripudeep K, MD      . ipratropium (ATROVENT) nebulizer solution 0.5 mg  0.5 mg Nebulization TID Rai, Ripudeep K, MD   0.5 mg at 08/19/17 1444   And  . levalbuterol (XOPENEX) nebulizer solution 0.63 mg  0.63 mg Nebulization TID Rai, Ripudeep K, MD   0.63 mg at 08/19/17 1444  . levalbuterol (XOPENEX) nebulizer solution 0.63 mg  0.63 mg Nebulization Q2H PRN Rai, Ripudeep K, MD      . MEDLINE mouth rinse  15 mL Mouth Rinse BID Rai, Ripudeep K, MD   15 mL at 08/19/17 0210  . methylPREDNISolone sodium succinate (SOLU-MEDROL) 125 mg/2 mL injection 60 mg  60 mg Intravenous Q12H Rai, Ripudeep K, MD      . piperacillin-tazobactam (ZOSYN) IVPB 3.375 g  3.375 g Intravenous Q8H Rai, Ripudeep K, MD      . saccharomyces boulardii (FLORASTOR)  capsule 250 mg  250 mg Oral BID Rai, Ripudeep K, MD   250 mg at 08/19/17 0810  . sulfacetamide (BLEPH-10) 10 % ophthalmic solution 1 drop  1 drop Both Eyes QID Rai, Ripudeep K, MD   1 drop at 08/19/17 1400  . vancomycin (VANCOCIN) IVPB 1000 mg/200 mL premix  1,000 mg Intravenous Q24H Norva Pavlov, [place]   Stopped at 08/18/17 1656     Discharge Medications: Please see discharge summary for a list of discharge medications.  Relevant Imaging  Results:  Relevant Lab Results:   Additional Information SS#: [id number]  Margarito Liner, LCSW

## 2017-08-19 NOTE — Progress Notes (Signed)
The nurse was made aware that the IV was placed in this patients R hand. Consuello Masse

## 2017-08-19 NOTE — Progress Notes (Signed)
Ref: Travis Jenny, MD   Subjective:  Awake. Coughing. Trying to feed with shaky hands. Afebrile. Troponin-I trending downward. Awaiting MBS for feeding recommendation.  Objective:  Vital Signs in the last 24 hours: Temp:  [98 F (36.7 C)-98.8 F (37.1 C)] 98.4 F (36.9 C) (10/01 0744) Pulse Rate:  [68-91] 68 (10/01 0352) Cardiac Rhythm: Normal sinus rhythm (10/01 0700) Resp:  [17-25] 22 (10/01 0352) BP: (105-152)/(46-111) 127/111 (10/01 0633) SpO2:  [91 %-98 %] 98 % (10/01 1610)  Physical Exam: BP Readings from Last 1 Encounters:  08/19/17 (!) 127/111    Wt Readings from Last 1 Encounters:  08/17/17 95.7 kg (210 lb 15.7 oz)    Weight change:  Body mass index is 30.27 kg/m. HEENT: [place]/AT, Eyes-Brown, PERL, EOMI, Conjunctiva-Pink, Sclera-Non-icteric Neck: No JVD, No bruit, Trachea midline. Lungs:  Coarse crackles, Bilateral. Cardiac:  Regular rhythm, normal S1 and S2, no S3. II/VI systolic murmur. Abdomen:  Soft, non-tender. BS present. Extremities:  1 + edema present. No cyanosis. No clubbing. Chronic contracture of several fingers and toes. CNS: AxOx3, Cranial nerves grossly intact, moves all 4 extremities.  Skin: Warm and dry.   Intake/Output from previous day: 09/30 0701 - 10/01 0700 In: 895.4 [P.O.:90; I.V.:505.4; IV Piggyback:300] Out: 550 [Urine:550]    Lab Results: BMET    Component Value Date/Time   NA 135 08/19/2017 0328   NA 136 08/18/2017 0217   NA 138 08/17/2017 1445   NA 145 04/24/2017   NA 144 09/26/2016   NA 141 06/14/2016   K 4.5 08/19/2017 0328   K 5.3 (H) 08/18/2017 0217   K 4.8 08/17/2017 1445   CL 107 08/19/2017 0328   CL 108 08/18/2017 0217   CL 108 08/17/2017 1445   CO2 18 (L) 08/19/2017 0328   CO2 19 (L) 08/18/2017 0217   CO2 18 (L) 08/17/2017 1445   GLUCOSE 365 (H) 08/19/2017 0328   GLUCOSE 373 (H) 08/18/2017 0217   GLUCOSE 293 (H) 08/17/2017 1445   BUN 64 (H) 08/19/2017 0328   BUN 51 (H) 08/18/2017 0217   BUN 48 (H) 08/17/2017  1445   BUN 35 (A) 04/24/2017   BUN 29 (A) 09/26/2016   BUN 39 (A) 06/14/2016   CREATININE 2.44 (H) 08/19/2017 0328   CREATININE 2.35 (H) 08/18/2017 0217   CREATININE 2.37 (H) 08/17/2017 1445   CALCIUM 7.2 (L) 08/19/2017 0328   CALCIUM 7.1 (L) 08/18/2017 0217   CALCIUM 7.9 (L) 08/17/2017 1445   GFRNONAA 23 (L) 08/19/2017 0328   GFRNONAA 24 (L) 08/18/2017 0217   GFRNONAA 24 (L) 08/17/2017 1445   GFRAA 27 (L) 08/19/2017 0328   GFRAA 28 (L) 08/18/2017 0217   GFRAA 28 (L) 08/17/2017 1445   CBC    Component Value Date/Time   WBC 6.6 08/19/2017 0328   RBC 2.85 (L) 08/19/2017 0328   HGB 9.0 (L) 08/19/2017 0328   HCT 27.5 (L) 08/19/2017 0328   PLT 129 (L) 08/19/2017 0328   MCV 96.5 08/19/2017 0328   MCH 31.6 08/19/2017 0328   MCHC 32.7 08/19/2017 0328   RDW 13.8 08/19/2017 0328   LYMPHSABS 2.2 11/04/2015 0340   MONOABS 0.6 11/04/2015 0340   EOSABS 0.3 11/04/2015 0340   BASOSABS 0.0 11/04/2015 0340   HEPATIC Function Panel No results for input(s): PROT in the last 8760 hours.  Invalid input(s):  ALBUMIN,  AST,  ALT,  ALKPHOS,  BILIDIR,  IBILI HEMOGLOBIN A1C No components found for: HGA1C,  MPG CARDIAC ENZYMES Lab Results  Component  Value Date   CKTOTAL 796 (H) 12/26/2010   CKMB (HH) 12/26/2010    10.9 CRITICAL RESULT CALLED TO, READ BACK BY AND VERIFIED WITH: B.RUDIN,RN 12/26/10 1054 BY BSLADE   TROPONINI 1.68 (HH) 08/18/2017   TROPONINI 3.18 (HH) 08/18/2017   TROPONINI 4.24 (HH) 08/17/2017   BNP No results for input(s): PROBNP in the last 8760 hours. TSH No results for input(s): TSH in the last 8760 hours. CHOLESTEROL  Recent Labs  01/25/17  CHOL 145    Scheduled Meds: . amLODipine  2.5 mg Oral Daily  . arformoterol  15 mcg Nebulization BID  . aspirin EC  81 mg Oral Daily  . atorvastatin  20 mg Oral QHS  . budesonide (PULMICORT) nebulizer solution  0.25 mg Nebulization BID  . cholecalciferol  2,000 Units Oral Daily  . divalproex  250 mg Oral TID  . memantine   28 mg Oral QHS   And  . donepezil  10 mg Oral QHS  . ferrous sulfate  325 mg Oral Q breakfast  . furosemide  40 mg Intravenous Daily  . gabapentin  200 mg Oral QHS  . hydrALAZINE  50 mg Oral Q8H  . insulin aspart  0-15 Units Subcutaneous TID WC  . insulin aspart  0-5 Units Subcutaneous QHS  . insulin aspart  5 Units Subcutaneous TID WC  . insulin glargine  15 Units Subcutaneous QHS  . ipratropium  0.5 mg Nebulization TID   And  . levalbuterol  0.63 mg Nebulization TID  . mouth rinse  15 mL Mouth Rinse BID  . methylPREDNISolone (SOLU-MEDROL) injection  60 mg Intravenous Q12H  . saccharomyces boulardii  250 mg Oral BID  . sulfacetamide  1 drop Both Eyes QID   Continuous Infusions: . heparin 1,250 Units/hr (08/19/17 0815)  . piperacillin-tazobactam (ZOSYN)  IV 3.375 g (08/19/17 0922)  . vancomycin Stopped (08/18/17 1656)   PRN Meds:.acetaminophen, levalbuterol  Assessment/Plan: Acute on chronic respiratory failure with hypoxia Acute diastolic left heart failure NSTEMI CAD CABG Type II DM CKD, III Sepsis Dementia Hyperlipidemia  Continue medical treatment with lasix. Add small dose amlodipine for blood pressure control. Increase activity and diet as tolerated. He is not a candidate for cardiac interventions due to multiple medical conditions    LOS: 2 days    Travis Cobb  MD  08/19/2017, 9:26 AM

## 2017-08-19 NOTE — Progress Notes (Signed)
Pts blood surgar 438, paged MD as per order to page if >400, orders given to keep sliding scale and meal coverage as is and give max dose of 20 units. Will monitor.

## 2017-08-19 NOTE — Progress Notes (Signed)
Modified Barium Swallow Progress Note  Patient Details  Name: Travis Coleman MRN: [id number] Date of Birth: 04-Mar-1932  Today's Date: 08/19/2017  Modified Barium Swallow completed.  Full report located under Chart Review in the Imaging Section.  Brief recommendations include the following:  Clinical Impression  Mild oral impairments marked by decreased cohesion, delayed bolus formation and lingual residue spilling over tongue. Laryngeal penetration to vocal cords with thin due to decreased timing and coordination of swallow which remained on anterior vestibule slightly without awareness. Bolus in pyriform sinuses before initiation of hyoid elevation and excursion. Mild-moderate vallecular residue reduced to mild given cues for second swallow. Appeared to have intermittent tightness of UES. Continue Dys 2, nectar thick liquids, crush meds, full assist for safety and feeding assist and ST.      Swallow Evaluation Recommendations       SLP Diet Recommendations: Dysphagia 2 (Fine chop) solids;Nectar thick liquid   Liquid Administration via: No straw;Cup   Medication Administration: Crushed with puree   Supervision: Patient able to self feed;Full supervision/cueing for compensatory strategies   Compensations: Minimize environmental distractions;Small sips/bites;Slow rate;Lingual sweep for clearance of pocketing   Postural Changes: Seated upright at 90 degrees   Oral Care Recommendations: Oral care BID        Royce Macadamia 08/19/2017,11:46 AM   Breck Coons Lonell Face.Ed [employer] [phone]

## 2017-08-19 NOTE — Progress Notes (Signed)
Palliative Medicine consult noted. Due to high referral volume, there may be a delay seeing this patient. Please call the Palliative Medicine Team office at [phone] if recommendations are needed in the interim.  Thank you for inviting [place] to see this patient.  Margret Chance Brenley Priore, RN, BSN, [facility] 08/19/2017 3:22 PM Cell [phone] 8:00-4:00 Monday-Friday Office [phone]

## 2017-08-19 NOTE — Progress Notes (Signed)
ANTICOAGULATION CONSULT NOTE - Follow Up Consult  Pharmacy Consult for Heparin  Indication: chest pain/ACS  No Known Allergies  Patient Measurements: Height:  (177.8 cm) Weight: 210 lb 15.7 oz (95.7 kg) IBW/kg (Calculated) : 73  Vital Signs: Temp: 98.4 F (36.9 C) (10/01 0352) Temp Source: Oral (10/01 0352) BP: 127/111 (10/01 1610) Pulse Rate: 68 (10/01 0352)  Labs:  Recent Labs  08/17/17 1445  08/17/17 1539 08/17/17 2015 08/18/17 0217 08/18/17 0936 08/18/17 1123 08/19/17 0328  HGB 10.2*  --   --   --  9.0*  --   --  9.0*  HCT 32.6*  --   --   --  28.2*  --   --  27.5*  PLT 130*  --   --   --  104*  --   --  129*  APTT  --   --  38*  --   --   --   --   --   LABPROT  --   --  14.7  --   --   --   --   --   INR  --   --  1.16  --   --   --   --   --   HEPARINUNFRC  --   --   --   --  0.33  --  0.25* 0.41  CREATININE 2.37*  --   --   --  2.35*  --   --  2.44*  TROPONINI  --   < > 3.10* 4.24* 3.18* 1.68*  --   --   < > = values in this interval not displayed.  Estimated Creatinine Clearance: 26.6 mL/min (A) (by C-G formula based on SCr of 2.44 mg/dL (H)).  Assessment: Heparin for NSTEMI, heparin level is therapeutic this morning at 0.41. CBC stable.   Goal of Therapy:  Heparin level 0.3-0.7 units/ml Monitor platelets by anticoagulation protocol: Yes   Plan:  1. Continue heparin gtt at 1250 units/hr 2. Daily heparin level  Pollyann Samples, PharmD, BCPS 08/19/2017, 7:44 AM

## 2017-08-19 NOTE — Progress Notes (Signed)
[facility]                                                                              Patient Demographics  Travis Coleman, is a 83 y.o. male, DOB - 1932/08/22, JKK:[id number]  Admit date - 08/17/2017   Admitting Physician Krisy Dix Krystal Eaton, MD  Outpatient Primary MD for the patient is [name], MD  Outpatient specialists:   LOS - 2  days   Medical records reviewed and are as summarized below:    Chief Complaint  Patient presents with  . Shortness of Breath       Brief summary    Patient is a 83 year old male with history of CAD status post CABG, CHF, chronic kidney disease stage III, advanced dementia, hypertension, hyperlipidemia, PVD presented from [facility] skilled nursing facility for shortness of breath. History was obtained from the ER records and patient's sister at the bedside. Per patient's sister, patient had shortness of breath in the last 2-3 days at the facility and patient was placed on nebulizer treatments, Levaquin, lasix. On the day of admission, EMS was called for acute respiratory distress and hypoxia, was placed on BiPAP in ED. Patient was noted to have diffuse rhonchi and wheezing. At baseline, patient is wheelchair bound, does not ambulate.  Assessment & Plan    Principal Problem:   Acute on chronic respiratory failure with hypoxia (HCC) with clinical pneumonia/HCAP, acute bronchitis, presented with acute respiratory distress, wheezing - BiPAP currently off, influenza panel negative, respiratory virus panel pending, urine strep antigen negative, urine legionella antigen pending - blood cultures 1/2  positive for GPC. BCID showed methicillin resistant coagulase negative staph, possible contaminant,  currently on antibiotics - continue IV antibiotics, tapered IV Solu-Medrol, continue scheduled bronchodilators, Pulmicort, Brovana, Lasix - SLP evaluation recommended dysphagia 2 diet with nectar thick liquids - VQ scan negative  Active  Problems:   Sepsis ([place]) - patient met sepsis criteria on admission with fever, tachycardia, tachypnea, acute respiratory failure likely source respiratory,, UA? UTI - follow urine culture and sensitivities, blood cultures 1/2  positive for GPC. 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  - creatinine increasing to 2.4, on diuresis, obtain renal ultrasound  Acute diastolic CHF - 2-D echo with EF of 60-65%,  continue IV Lasix, cardiology following - still positive balance of 1.8 L  Code Status: full CODE STATUS DVT Prophylaxis:  Heparin subcutaneous Family Communication: Discussed in detail with the patient, all imaging results, lab results explained to the patient   Disposition Plan:  Time Spent in minutes 35 minutes  Procedures:  BiPAP  Consultants:   cardiology  Antimicrobials:   IV vancomycin 9/29  IV Zosyn 9/29   Medications  Scheduled Meds: . amLODipine  2.5 mg Oral Daily  . arformoterol  15 mcg  Nebulization BID  . aspirin EC  81 mg Oral Daily  . atorvastatin  20 mg Oral QHS  . budesonide (PULMICORT) nebulizer solution  0.25 mg Nebulization BID  . cholecalciferol  2,000 Units Oral Daily  . divalproex  250 mg Oral TID  . memantine  28 mg Oral QHS   And  . donepezil  10 mg Oral QHS  . ferrous sulfate  325 mg Oral Q breakfast  . furosemide  40 mg Intravenous Daily  . gabapentin  200 mg Oral QHS  . hydrALAZINE  50 mg Oral Q8H  . insulin aspart  0-15 Units Subcutaneous TID WC  . insulin aspart  0-5 Units Subcutaneous QHS  . insulin aspart  5 Units Subcutaneous TID WC  . insulin glargine  15 Units Subcutaneous QHS  . ipratropium  0.5 mg Nebulization TID   And  . levalbuterol  0.63 mg Nebulization TID  . mouth rinse  15 mL Mouth Rinse BID  . methylPREDNISolone (SOLU-MEDROL) injection  60 mg Intravenous Q12H  . saccharomyces boulardii  250 mg Oral BID  . sulfacetamide  1 drop Both Eyes QID   Continuous Infusions: . heparin 1,250 Units/hr (08/19/17 0815)  . piperacillin-tazobactam (ZOSYN)  IV 3.375 g (08/19/17 0922)  . vancomycin Stopped (08/18/17 1656)   PRN Meds:.acetaminophen, levalbuterol   Antibiotics   Anti-infectives    Start     Dose/Rate Route Frequency Ordered Stop   08/18/17 1600  vancomycin (VANCOCIN) IVPB 1000 mg/200 mL premix     1,000 mg 200 mL/hr over 60 Minutes Intravenous Every 24 hours 08/17/17 1631     08/17/17 2345  piperacillin-tazobactam (ZOSYN) IVPB 3.375 g     3.375 g 12.5 mL/hr over 240 Minutes Intravenous Every 8 hours 08/17/17 1631     08/17/17 1545  piperacillin-tazobactam (ZOSYN) IVPB 3.375 g     3.375 g 100 mL/hr over 30 Minutes Intravenous  Once 08/17/17 1530 08/17/17 1616   08/17/17 1545  vancomycin (VANCOCIN) IVPB 1000 mg/200 mL premix     1,000 mg 200 mL/hr over 60 Minutes Intravenous  Once 08/17/17 1530 08/17/17 1657        Subjective:   Travis Coleman was seen and examined today. BiPAP off, alert and awake, no fevers, feeling  slightly better today. No fevers or chills. Patient denies dizziness, chest pain, abdominal pain, N/V/D/C, new weakness, numbess, tingling.   Objective:   Vitals:   08/19/17 0633 08/19/17 0744 08/19/17 0816 08/19/17 0822  BP: (!) 127/111     Pulse:      Resp:      Temp:  98.4 F (36.9 C)    TempSrc:  Oral    SpO2:   97% 98%  Weight:      Height:        Intake/Output Summary (Last 24 hours) at 08/19/17 1025 Last data filed at 08/19/17 0751  Gross per 24 hour  Intake  669.1 ml  Output              650 ml  Net             19.1 ml     Wt Readings from Last 3 Encounters:  08/17/17 95.7 kg (210 lb 15.7 oz)  08/16/17 95 kg (209 lb 6.4 oz)  08/15/17 95 kg (209 lb 6.4 oz)     Exam   General: Alert and oriented x2  Eyes:   HEENT:  Atraumatic, normocephalic  Cardiovascular: S1 S2 auscultated, RRR  Respiratory: diffuse rhonchi bilaterally, improving from yesterday  Gastrointestinal: Soft, nontender, nondistended, + bowel sounds  Ext: 1+ pedal edema bilaterally  Neuro:moving all 4 extremities  Musculoskeletal: No digital cyanosis, clubbing  Skin: No rashes  Psych: alert and oriented x2    Data Reviewed:  I have personally reviewed following labs and imaging studies  Micro Results Recent Results (from the past 240 hour(s))  Blood Culture (routine x 2)     Status: None (Preliminary result)   Collection Time: 08/17/17  2:45 PM  Result Value Ref Range Status   Specimen Description BLOOD RIGHT HAND  Final   Special Requests   Final    BOTTLES DRAWN AEROBIC AND ANAEROBIC Blood Culture adequate volume   Culture  Setup Time   Final    GRAM POSITIVE COCCI IN CLUSTERS IN BOTH AEROBIC AND ANAEROBIC BOTTLES CRITICAL RESULT CALLED TO, READ BACK BY AND VERIFIED WITH: M MACCIA,PHARMD AT 1113 08/18/17 BY L BENFIELD    Culture GRAM POSITIVE COCCI IDENTIFICATION TO FOLLOW   Final   Report Status PENDING  Incomplete  Blood Culture ID Panel (Reflexed)     Status:  Abnormal   Collection Time: 08/17/17  2:45 PM  Result Value Ref Range Status   Enterococcus species NOT DETECTED NOT DETECTED Final   Listeria monocytogenes NOT DETECTED NOT DETECTED Final   Staphylococcus species DETECTED (A) NOT DETECTED Final    Comment: Methicillin (oxacillin) resistant coagulase negative staphylococcus. Possible blood culture contaminant (unless isolated from more than one blood culture draw or clinical case suggests pathogenicity). No antibiotic treatment is indicated for blood  culture contaminants. CRITICAL RESULT CALLED TO, READ BACK BY AND VERIFIED WITH: M MACCIA,PHARMD AT 1113 08/18/17 BY L BENFIELD    Staphylococcus aureus NOT DETECTED NOT DETECTED Final   Methicillin resistance DETECTED (A) NOT DETECTED Final    Comment: CRITICAL RESULT CALLED TO, READ BACK BY AND VERIFIED WITH: M MACCIA,PHARMD AT 1113 08/18/17 BY L BENFIELD    Streptococcus species NOT DETECTED NOT DETECTED Final   Streptococcus agalactiae NOT DETECTED NOT DETECTED Final   Streptococcus pneumoniae NOT DETECTED NOT DETECTED Final   Streptococcus pyogenes NOT DETECTED NOT DETECTED Final   Acinetobacter baumannii NOT DETECTED NOT DETECTED Final   Enterobacteriaceae species NOT DETECTED NOT DETECTED Final   Enterobacter cloacae complex NOT DETECTED NOT DETECTED Final   Escherichia coli NOT DETECTED NOT DETECTED Final   Klebsiella oxytoca NOT DETECTED NOT DETECTED Final   Klebsiella pneumoniae NOT DETECTED NOT DETECTED Final   Proteus species NOT DETECTED NOT DETECTED Final   Serratia marcescens NOT DETECTED NOT DETECTED Final   Haemophilus influenzae NOT DETECTED NOT DETECTED Final   Neisseria meningitidis NOT DETECTED NOT DETECTED Final   Pseudomonas aeruginosa NOT DETECTED NOT DETECTED Final   Candida albicans NOT DETECTED NOT DETECTED Final   Candida glabrata NOT DETECTED NOT DETECTED Final   Candida krusei NOT DETECTED NOT DETECTED Final   Candida parapsilosis NOT  DETECTED NOT DETECTED  Final   Candida tropicalis NOT DETECTED NOT DETECTED Final  Blood Culture (routine x 2)     Status: None (Preliminary result)   Collection Time: 08/17/17  3:39 PM  Result Value Ref Range Status   Specimen Description BLOOD RIGHT HAND  Final   Special Requests   Final    BOTTLES DRAWN AEROBIC AND ANAEROBIC Blood Culture adequate volume   Culture NO GROWTH < 24 HOURS  Final   Report Status PENDING  Incomplete  Urine culture     Status: None   Collection Time: 08/17/17  3:54 PM  Result Value Ref Range Status   Specimen Description URINE, CATHETERIZED  Final   Special Requests NONE  Final   Culture NO GROWTH  Final   Report Status 08/18/2017 FINAL  Final  MRSA PCR Screening     Status: None   Collection Time: 08/17/17  8:05 PM  Result Value Ref Range Status   MRSA by PCR NEGATIVE NEGATIVE Final    Comment:        The GeneXpert MRSA Assay (FDA approved for NASAL specimens only), is one component of a comprehensive MRSA colonization surveillance program. It is not intended to diagnose MRSA infection nor to guide or monitor treatment for MRSA infections.   Urine culture     Status: None   Collection Time: 08/18/17 10:17 AM  Result Value Ref Range Status   Specimen Description URINE, CATHETERIZED  Final   Special Requests NONE  Final   Culture NO GROWTH  Final   Report Status 08/19/2017 FINAL  Final    Radiology Reports Nm Pulmonary Perf And Vent  Result Date: 08/18/2017 CLINICAL DATA:  83 year old male with acute shortness of breath for 2-3 days. EXAM: NUCLEAR MEDICINE VENTILATION - PERFUSION LUNG SCAN TECHNIQUE: Ventilation images were obtained in multiple projections using inhaled aerosol Tc-34mDTPA. Perfusion images were obtained in multiple projections after intravenous injection of Tc-970mAA. RADIOPHARMACEUTICALS:  31.7 mCi Technetium-9911mPA aerosol inhalation and 4.2 mCi Technetium-37m3m IV COMPARISON:  08/17/2017 chest radiograph FINDINGS: Ventilation: No focal  ventilation defect. Central clumping is present. Perfusion: No wedge shaped peripheral perfusion defects to suggest acute pulmonary embolism. IMPRESSION: No evidence of pulmonary embolism.  No VQ mismatches. Electronically Signed   By: JeffMargarette Canada.   On: 08/18/2017 10:27   Dg Chest Port 1 View  Result Date: 08/18/2017 CLINICAL DATA:  85 y16r old male with shortness of breath. EXAM: PORTABLE CHEST 1 VIEW COMPARISON:  08/17/2017 and prior exams FINDINGS: Cardiomegaly and CABG changes again noted. Pulmonary vascular congestion noted with possible mild interstitial edema. No airspace disease, pleural effusion or pneumothorax. IMPRESSION: Cardiomegaly with pulmonary vascular congestion and possible mild interstitial edema. Electronically Signed   By: JeffMargarette Canada.   On: 08/18/2017 11:07   Dg Chest Portable 1 View  Result Date: 08/17/2017 CLINICAL DATA:  SOB today. Pt here from nursing home. On BiPAP at time of imaging. Hx of HTN, CHF, DM, CAD, CKD. EXAM: PORTABLE CHEST 1 VIEW COMPARISON:  11/03/2015 FINDINGS: Status post median sternotomy and CABG. The heart is enlarged. There are no focal consolidations or pleural effusions. No pulmonary edema. IMPRESSION: Stable cardiomegaly. Electronically Signed   By: ElizNolon Nations.   On: 08/17/2017 15:25    Lab Data:  CBC:  Recent Labs Lab 08/17/17 1445 08/18/17 0217 08/19/17 0328  WBC 5.4 3.8* 6.6  HGB 10.2* 9.0* 9.0*  HCT 32.6* 28.2* 27.5*  MCV 97.9 97.9 96.5  PLT 130*  104* 081*   Basic Metabolic Panel:  Recent Labs Lab 08/17/17 1445 08/18/17 0217 08/19/17 0328  NA 138 136 135  K 4.8 5.3* 4.5  CL 108 108 107  CO2 18* 19* 18*  GLUCOSE 293* 373* 365*  BUN 48* 51* 64*  CREATININE 2.37* 2.35* 2.44*  CALCIUM 7.9* 7.1* 7.2*   GFR: Estimated Creatinine Clearance: 26.6 mL/min (A) (by C-G formula based on SCr of 2.44 mg/dL (H)). Liver Function Tests: No results for input(s): AST, ALT, ALKPHOS, BILITOT, PROT, ALBUMIN in the last  168 hours. No results for input(s): LIPASE, AMYLASE in the last 168 hours. No results for input(s): AMMONIA in the last 168 hours. Coagulation Profile:  Recent Labs Lab 08/17/17 1539  INR 1.16   Cardiac Enzymes:  Recent Labs Lab 08/17/17 1539 08/17/17 2015 08/18/17 0217 08/18/17 0936  TROPONINI 3.10* 4.24* 3.18* 1.68*   BNP (last 3 results) No results for input(s): PROBNP in the last 8760 hours. HbA1C:  Recent Labs  08/17/17 2015 08/18/17 1157  HGBA1C 7.2* 7.1*   CBG:  Recent Labs Lab 08/18/17 0922 08/18/17 1204 08/18/17 1616 08/18/17 2134 08/19/17 0740  GLUCAP 362* 371* 356* 321* 424*   Lipid Profile: No results for input(s): CHOL, HDL, LDLCALC, TRIG, CHOLHDL, LDLDIRECT in the last 72 hours. Thyroid Function Tests: No results for input(s): TSH, T4TOTAL, FREET4, T3FREE, THYROIDAB in the last 72 hours. Anemia Panel: No results for input(s): VITAMINB12, FOLATE, FERRITIN, TIBC, IRON, RETICCTPCT in the last 72 hours. Urine analysis:    Component Value Date/Time   COLORURINE YELLOW 08/17/2017 1554   APPEARANCEUR CLOUDY (A) 08/17/2017 1554   LABSPEC 1.017 08/17/2017 1554   PHURINE 5.0 08/17/2017 1554   GLUCOSEU NEGATIVE 08/17/2017 1554   HGBUR NEGATIVE 08/17/2017 [place] 08/17/2017 1554   KETONESUR NEGATIVE 08/17/2017 1554   PROTEINUR >=300 (A) 08/17/2017 1554   UROBILINOGEN 0.2 06/28/2014 2020   NITRITE NEGATIVE 08/17/2017 1554   LEUKOCYTESUR SMALL (A) 08/17/2017 1554     Sunil Hue M.D. [facility] 08/19/2017, 10:25 AM  Pager: [phone] Between 7am to 7pm - call Pager - [phone]  After 7pm go to [email or web address] - password [email or web address]  Call night coverage person covering after 7pm

## 2017-08-19 NOTE — Clinical Social Work Note (Signed)
Clinical Social Work Assessment  Patient Details  Name: Travis Coleman MRN: [id number] Date of Birth: 1932-08-29  Date of referral:  08/19/17               Reason for consult:  Discharge Planning                Permission sought to share information with:  Facility Medical sales representative, Family Supports Permission granted to share information::  Yes, Verbal Permission Granted  Name::     Travis Coleman  Agency::  [facility]  Relationship::  Sister  Contact Information:  [phone]  Housing/Transportation Living arrangements for the past 2 months:  Skilled Building surveyor of Information:  Medical Team, Facility, Other (Comment Required) (Sister) Patient Interpreter Needed:  None Criminal Activity/Legal Involvement Pertinent to Current Situation/Hospitalization:  No - Comment as needed Significant Relationships:  Siblings Lives with:  Facility Resident Do you feel safe going back to the place where you live?  Yes Need for family participation in patient care:  Yes (Comment)  Care giving concerns:  Patient is a long-term resident at [facility].   Social Worker assessment / plan:  Patient oriented to self only. No supports at bedside. CSW called patient's sister. CSW introduced role and explained that discharge planning would be discussed. [name] confirmed that he is from [facility] and the plan is for him to return once discharged. No further concerns. CSW encouraged patient's sister to contact CSW as needed. CSW will continue to follow patient and his sister for support and facilitate discharge back to SNF once medically stable.  Employment status:  Retired Health and safety inspector:  Medicare PT Recommendations:  Not assessed at this time Information / Referral to community resources:  Skilled Nursing Facility  Patient/Family's Response to care:  Patient oriented to self only. [name] agreeable to return to SNF. [name] supportive and  involved in patient's care. [name] appreciated social work intervention.  Patient/Family's Understanding of and Emotional Response to Diagnosis, Current Treatment, and Prognosis:  Patient oriented to self only. [name] has a good understanding of the reason for admission and his need to return to SNF once medically stable. [name] appears happy with hospital care.  Emotional Assessment Appearance:  Appears stated age Attitude/Demeanor/Rapport:  Unable to Assess Affect (typically observed):  Unable to Assess Orientation:  Oriented to Self Alcohol / Substance use:  Never Used Psych involvement (Current and /or in the community):  No (Comment)  Discharge Needs  Concerns to be addressed:  Care Coordination Readmission within the last 30 days:  No Current discharge risk:  Cognitively Impaired Barriers to Discharge:  Continued Medical Work up   Margarito Liner, LCSW 08/19/2017, 3:23 PM

## 2017-08-19 NOTE — Progress Notes (Signed)
Pharmacy Antibiotic Note Travis Coleman is a 83 y.o. male admitted on 08/17/2017 with concern for sepsis.  Pharmacy has been consulted for Zosyn and vancomycin dosing for empiric coverage.  Plan: 1. Vancomycin 1000 IV every 24 hours.  Goal trough 15-20 mcg/mL. 2. Zosyn 3.375g IV q8h (4 hour infusion).  3. Await pending micro data and narrow abx as feasible.   Height:  (177.8 cm) Weight: 210 lb 15.7 oz (95.7 kg) IBW/kg (Calculated) : 73  Temp (24hrs), Avg:98.4 F (36.9 C), Min:98 F (36.7 C), Max:98.8 F (37.1 C)   Recent Labs Lab 08/17/17 1445 08/17/17 1550 08/17/17 1917 08/17/17 2015 08/18/17 0217 08/19/17 0328  WBC 5.4  --   --   --  3.8* 6.6  CREATININE 2.37*  --   --   --  2.35* 2.44*  LATICACIDVEN  --  2.10* 1.6 1.8  --   --     Estimated Creatinine Clearance: 26.6 mL/min (A) (by C-G formula based on SCr of 2.44 mg/dL (H)).    No Known Allergies  Antimicrobials this admission: 9/30 Zosyn >>  9/30 vancomycin >>   Microbiology results: 9/29 Bcx: MR coag neg staph in BCID 9/29 UCx: ngF 9/29 MRSA PCR: neg 9/30 UCx: px  Thank you for allowing pharmacy to be a part of this patient's care.  Pollyann Samples, PharmD, BCPS 08/19/2017, 7:42 AM

## 2017-08-20 DIAGNOSIS — R6520 Severe sepsis without septic shock: Secondary | ICD-10-CM

## 2017-08-20 DIAGNOSIS — Z7189 Other specified counseling: Secondary | ICD-10-CM

## 2017-08-20 DIAGNOSIS — A419 Sepsis, unspecified organism: Secondary | ICD-10-CM

## 2017-08-20 LAB — BASIC METABOLIC PANEL
Anion gap: 11 (ref 5–15)
BUN: 83 mg/dL — AB (ref 6–20)
CALCIUM: 7.3 mg/dL — AB (ref 8.9–10.3)
CHLORIDE: 109 mmol/L (ref 101–111)
CO2: 18 mmol/L — ABNORMAL LOW (ref 22–32)
CREATININE: 2.9 mg/dL — AB (ref 0.61–1.24)
GFR calc non Af Amer: 19 mL/min — ABNORMAL LOW (ref >60)
GFR, EST AFRICAN AMERICAN: 22 mL/min — AB (ref >60)
Glucose, Bld: 395 mg/dL — ABNORMAL HIGH (ref 65–99)
Potassium: 4.1 mmol/L (ref 3.5–5.1)
SODIUM: 138 mmol/L (ref 135–145)

## 2017-08-20 LAB — CULTURE, BLOOD (ROUTINE X 2): SPECIAL REQUESTS: ADEQUATE

## 2017-08-20 LAB — CBC
HEMATOCRIT: 27 % — AB (ref 39.0–52.0)
Hemoglobin: 8.6 g/dL — ABNORMAL LOW (ref 13.0–17.0)
MCH: 30.4 pg (ref 26.0–34.0)
MCHC: 31.9 g/dL (ref 30.0–36.0)
MCV: 95.4 fL (ref 78.0–100.0)
PLATELETS: 159 10*3/uL (ref 150–400)
RBC: 2.83 MIL/uL — ABNORMAL LOW (ref 4.22–5.81)
RDW: 13.6 % (ref 11.5–15.5)
WBC: 9.8 10*3/uL (ref 4.0–10.5)

## 2017-08-20 LAB — GLUCOSE, CAPILLARY
GLUCOSE-CAPILLARY: 404 mg/dL — AB (ref 65–99)
Glucose-Capillary: 390 mg/dL — ABNORMAL HIGH (ref 65–99)
Glucose-Capillary: 420 mg/dL — ABNORMAL HIGH (ref 65–99)
Glucose-Capillary: 370 mg/dL — ABNORMAL HIGH (ref 65–99)

## 2017-08-20 LAB — HEPARIN LEVEL (UNFRACTIONATED): HEPARIN UNFRACTIONATED: 0.47 [IU]/mL (ref 0.30–0.70)

## 2017-08-20 MED ORDER — INSULIN GLARGINE 100 UNIT/ML ~~LOC~~ SOLN
18.0000 [IU] | Freq: Every day | SUBCUTANEOUS | Status: DC
  Administered 2017-08-20 – 2017-08-23 (×4): 18 [IU] via SUBCUTANEOUS
  Filled 2017-08-20 (×4): qty 0.18

## 2017-08-20 MED ORDER — PREDNISONE 20 MG PO TABS
40.0000 mg | ORAL_TABLET | Freq: Every day | ORAL | Status: DC
  Administered 2017-08-21 – 2017-08-24 (×4): 40 mg via ORAL
  Filled 2017-08-20 (×4): qty 2

## 2017-08-20 MED ORDER — VANCOMYCIN HCL IN DEXTROSE 1-5 GM/200ML-% IV SOLN
1000.0000 mg | INTRAVENOUS | Status: DC
  Filled 2017-08-20: qty 200

## 2017-08-20 MED ORDER — HEPARIN SODIUM (PORCINE) 5000 UNIT/ML IJ SOLN
5000.0000 [IU] | Freq: Three times a day (TID) | INTRAMUSCULAR | Status: DC
  Administered 2017-08-20 – 2017-08-27 (×20): 5000 [IU] via SUBCUTANEOUS
  Filled 2017-08-20 (×20): qty 1

## 2017-08-20 MED ORDER — METHYLPREDNISOLONE SODIUM SUCC 40 MG IJ SOLR: 40.0000 mg | Freq: Two times a day (BID) | INTRAMUSCULAR | Status: DC

## 2017-08-20 MED ORDER — INSULIN ASPART 100 UNIT/ML ~~LOC~~ SOLN
7.0000 [IU] | Freq: Three times a day (TID) | SUBCUTANEOUS | Status: DC
  Administered 2017-08-20 – 2017-08-25 (×14): 7 [IU] via SUBCUTANEOUS

## 2017-08-20 NOTE — Progress Notes (Signed)
Ref: Florentina Jenny, MD   Subjective:  Awake. Asking for food. Decreasing cough. Not a candidate for cardiac interventions.  Objective:  Vital Signs in the last 24 hours: Temp:  [97.3 F (36.3 C)-98.3 F (36.8 C)] 98 F (36.7 C) (10/02 0800) Pulse Rate:  [72-82] 76 (10/02 0800) Cardiac Rhythm: Normal sinus rhythm (10/02 0700) Resp:  [19-22] 22 (10/02 0800) BP: (95-162)/(50-70) 162/68 (10/02 0800) SpO2:  [100 %] 100 % (10/02 0809)  Physical Exam: BP Readings from Last 1 Encounters:  08/20/17 (!) 162/68    Wt Readings from Last 1 Encounters:  08/17/17 95.7 kg (210 lb 15.7 oz)    Weight change:  Body mass index is 30.27 kg/m. HEENT: [place]/AT, Eyes-Blue, PERL, EOMI, Conjunctiva-Pink, Sclera-Non-icteric Neck: No JVD, No bruit, Trachea midline. Lungs:  Rhonchi, Bilateral. Cardiac:  Regular rhythm, normal S1 and S2, no S3. II/VI systolic murmur. Abdomen:  Soft, non-tender. BS present. Extremities:  Trace edema present. No cyanosis. No clubbing. Chronic contracture of fingers and toes. CNS: AxOx3, Cranial nerves grossly intact, moves all 4 extremities.  Skin: Warm and dry.   Intake/Output from previous day: 10/01 0701 - 10/02 0700 In: 442.5 [P.O.:180; I.V.:262.5] Out: 1075 [Urine:1075]    Lab Results: BMET    Component Value Date/Time   NA 138 08/20/2017 0212   NA 135 08/19/2017 0328   NA 136 08/18/2017 0217   NA 145 04/24/2017   NA 144 09/26/2016   NA 141 06/14/2016   K 4.1 08/20/2017 0212   K 4.5 08/19/2017 0328   K 5.3 (H) 08/18/2017 0217   CL 109 08/20/2017 0212   CL 107 08/19/2017 0328   CL 108 08/18/2017 0217   CO2 18 (L) 08/20/2017 0212   CO2 18 (L) 08/19/2017 0328   CO2 19 (L) 08/18/2017 0217   GLUCOSE 395 (H) 08/20/2017 0212   GLUCOSE 365 (H) 08/19/2017 0328   GLUCOSE 373 (H) 08/18/2017 0217   BUN 83 (H) 08/20/2017 0212   BUN 64 (H) 08/19/2017 0328   BUN 51 (H) 08/18/2017 0217   BUN 35 (A) 04/24/2017   BUN 29 (A) 09/26/2016   BUN 39 (A) 06/14/2016   CREATININE 2.90 (H) 08/20/2017 0212   CREATININE 2.44 (H) 08/19/2017 0328   CREATININE 2.35 (H) 08/18/2017 0217   CALCIUM 7.3 (L) 08/20/2017 0212   CALCIUM 7.2 (L) 08/19/2017 0328   CALCIUM 7.1 (L) 08/18/2017 0217   GFRNONAA 19 (L) 08/20/2017 0212   GFRNONAA 23 (L) 08/19/2017 0328   GFRNONAA 24 (L) 08/18/2017 0217   GFRAA 22 (L) 08/20/2017 0212   GFRAA 27 (L) 08/19/2017 0328   GFRAA 28 (L) 08/18/2017 0217   CBC    Component Value Date/Time   WBC 9.8 08/20/2017 0212   RBC 2.83 (L) 08/20/2017 0212   HGB 8.6 (L) 08/20/2017 0212   HCT 27.0 (L) 08/20/2017 0212   PLT 159 08/20/2017 0212   MCV 95.4 08/20/2017 0212   MCH 30.4 08/20/2017 0212   MCHC 31.9 08/20/2017 0212   RDW 13.6 08/20/2017 0212   LYMPHSABS 2.2 11/04/2015 0340   MONOABS 0.6 11/04/2015 0340   EOSABS 0.3 11/04/2015 0340   BASOSABS 0.0 11/04/2015 0340   HEPATIC Function Panel No results for input(s): PROT in the last 8760 hours.  Invalid input(s):  ALBUMIN,  AST,  ALT,  ALKPHOS,  BILIDIR,  IBILI HEMOGLOBIN A1C No components found for: HGA1C,  MPG CARDIAC ENZYMES Lab Results  Component Value Date   CKTOTAL 796 (H) 12/26/2010   CKMB (HH) 12/26/2010  10.9 CRITICAL RESULT CALLED TO, READ BACK BY AND VERIFIED WITH: B.RUDIN,RN 12/26/10 1054 BY BSLADE   TROPONINI 1.68 (HH) 08/18/2017   TROPONINI 3.18 (HH) 08/18/2017   TROPONINI 4.24 (HH) 08/17/2017   BNP No results for input(s): PROBNP in the last 8760 hours. TSH No results for input(s): TSH in the last 8760 hours. CHOLESTEROL  Recent Labs  01/25/17  CHOL 145    Scheduled Meds: . amLODipine  2.5 mg Oral Daily  . arformoterol  15 mcg Nebulization BID  . aspirin EC  81 mg Oral Daily  . atorvastatin  20 mg Oral QHS  . budesonide (PULMICORT) nebulizer solution  0.25 mg Nebulization BID  . cholecalciferol  2,000 Units Oral Daily  . divalproex  250 mg Oral TID  . memantine  28 mg Oral QHS   And  . donepezil  10 mg Oral QHS  . ferrous sulfate  325 mg Oral Q  breakfast  . furosemide  40 mg Intravenous Daily  . gabapentin  200 mg Oral QHS  . hydrALAZINE  50 mg Oral Q8H  . insulin aspart  0-15 Units Subcutaneous TID WC  . insulin aspart  0-5 Units Subcutaneous QHS  . insulin aspart  5 Units Subcutaneous TID WC  . insulin glargine  15 Units Subcutaneous QHS  . ipratropium  0.5 mg Nebulization TID   And  . levalbuterol  0.63 mg Nebulization TID  . mouth rinse  15 mL Mouth Rinse BID  . methylPREDNISolone (SOLU-MEDROL) injection  60 mg Intravenous Q12H  . saccharomyces boulardii  250 mg Oral BID  . sulfacetamide  1 drop Both Eyes QID   Continuous Infusions: . piperacillin-tazobactam (ZOSYN)  IV 3.375 g (08/20/17 0919)  . vancomycin 1,000 mg (08/19/17 1606)   PRN Meds:.acetaminophen, levalbuterol  Assessment/Plan: Acute on chronic respiratory failure with hypoxia Acute diastolic left heart failure NSTEMI CAD CABG CKD, III Sepsis Dementia Hyperlipidemia  Continue medical treatment. DC IV heparin.   LOS: 3 days    [name]  MD  08/20/2017, 10:36 AM

## 2017-08-20 NOTE — Progress Notes (Signed)
Pharmacy Antibiotic Note Travis Coleman is a 83 y.o. male admitted on 08/17/2017 with concern for sepsis.  Pharmacy has been consulted for Zosyn and vancomycin dosing for empiric coverage.  Scr/BUN continue to trend up. RVP positive for rhinovirus.   Plan: 1. Empirically adjust vancomycin to 1 gram IV every 48 hours based on SCr/BUN trend 2. Continue Zosyn 3.375 grams IV every 8 hours (infused over 4 hours)  3. ? Scale back antibiotics soon   Height:  (177.8 cm) Weight: 210 lb 15.7 oz (95.7 kg) IBW/kg (Calculated) : 73  Temp (24hrs), Avg:98 F (36.7 C), Min:97.5 F (36.4 C), Max:98.3 F (36.8 C)   Recent Labs Lab 08/17/17 1445 08/17/17 1550 08/17/17 1917 08/17/17 2015 08/18/17 0217 08/19/17 0328 08/20/17 0212  WBC 5.4  --   --   --  3.8* 6.6 9.8  CREATININE 2.37*  --   --   --  2.35* 2.44* 2.90*  LATICACIDVEN  --  2.10* 1.6 1.8  --   --   --     Estimated Creatinine Clearance: 22.4 mL/min (A) (by C-G formula based on SCr of 2.9 mg/dL (H)).    No Known Allergies  Antimicrobials this admission: 9/30 Zosyn >>  9/30 vancomycin >>   Microbiology results: 9/29 Bcx: MR coag neg staph in BCID 9/29 UCx: ngF 929 RVP: positive rhinovirus  9/29 MRSA PCR: neg 9/30 UCx: px  Thank you for allowing pharmacy to be a part of this patient's care.  Pollyann Samples, PharmD, BCPS 08/20/2017, 12:55 PM

## 2017-08-20 NOTE — Progress Notes (Signed)
[facility]                                                                              Patient Demographics  Travis Coleman, is a 83 y.o. male, DOB - 1932/04/08, TGY:[id number]  Admit date - 08/17/2017   Admitting Physician Preesha Benjamin Krystal Eaton, MD  Outpatient Primary MD for the patient is [name], MD  Outpatient specialists:   LOS - 3  days   Medical records reviewed and are as summarized below:    Chief Complaint  Patient presents with  . Shortness of Breath       Brief summary    Patient is a 83 year old male with history of CAD status post CABG, CHF, chronic kidney disease stage III, advanced dementia, hypertension, hyperlipidemia, PVD presented from [facility] for shortness of breath. History was obtained from the ER records and patient's sister at the bedside. Per patient's sister, patient had shortness of breath in the last 2-3 days at the facility and patient was placed on nebulizer treatments, Levaquin, lasix. On the day of admission, EMS was called for acute respiratory distress and hypoxia, was placed on BiPAP in ED. Patient was noted to have diffuse rhonchi and wheezing. At baseline, patient is wheelchair bound, does not ambulate.  Assessment & Plan    Principal Problem:   Acute on chronic respiratory failure with hypoxia (HCC) with clinical pneumonia/HCAP, acute bronchitis, presented with acute respiratory distress, wheezing - BiPAP currently off, influenza panel negative, Respiratory virus panel positive for rhinovirus/enterovirus, urine strep antigen and urine legionella antigen negative  - blood cultures 1/2  positive for Coagulase negative staph, likely contaminant  - Continue IV antibiotics, tapered IV Solu-Medrol, transitioned to oral prednisone in a.m.  - Continue Pulmicort, Brovana, Lasix  - SLP evaluation recommended dysphagia 2 diet with nectar thick liquids - VQ scan negative  Active Problems:   Sepsis ([place]) -  patient met sepsis criteria on admission with fever, tachycardia, tachypnea, acute respiratory failure likely source respiratory. Urine culture negative - follow urine culture and sensitivities, blood cultures 1/2  positive for GPC. BCID showed methicillin resistant coagulase negative staph, possible contaminant,  currently on antibiotics - continue IV antibiotics    NSTEMI (non-ST elevated myocardial infarction) (HCC)with a history of  CAD (coronary artery disease) s/p CABG - cardiology following, elevated troponins - Currently on IV heparin, aspirin, Lipitor. 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  - Increase Lantus to 18 units at bedtime, NovoLog meal coverage increased to 7 units TID, continue sliding scale insulin    Acute on Chronic kidney disease (CKD), stage III (moderate) - patient was given a fluid trial without any improvement in the written function, chest x-ray now shows fluid overload, will DC fluids - Baseline creatinine 1.3-1.4.  - Renal ultrasound showed no  obstruction, bilateral cortical atrophia and increased cortical echotexture consistent with medical renal disease - Creatinine increasing to 2.9 today likely due to IV Lasix, holding Lasix. Recheck BMET in a.m.  Acute diastolic CHF - 2-D echo with EF of 60-65%, improving - Hold Lasix, cardiology following   Code Status: full CODE STATUS DVT Prophylaxis:  Heparin subcutaneous Family Communication: Discussed in detail with the patient, all imaging results, lab results explained to the patient   Disposition Plan:  Time Spent in minutes 25  minutes  Procedures:  BiPAP  Consultants:   cardiology  Antimicrobials:   IV vancomycin 9/29  IV Zosyn 9/29   Medications  Scheduled Meds: . amLODipine  2.5 mg Oral Daily  . arformoterol  15 mcg Nebulization BID  . aspirin EC  81 mg Oral Daily  . atorvastatin  20 mg Oral QHS  . budesonide (PULMICORT) nebulizer solution  0.25 mg Nebulization BID  . cholecalciferol  2,000 Units Oral Daily  . divalproex  250 mg Oral TID  . memantine  28 mg Oral QHS   And  . donepezil  10 mg Oral QHS  . ferrous sulfate  325 mg Oral Q breakfast  . furosemide  40 mg Intravenous Daily  . gabapentin  200 mg Oral QHS  . heparin  5,000 Units Subcutaneous Q8H  . hydrALAZINE  50 mg Oral Q8H  . insulin aspart  0-15 Units Subcutaneous TID WC  . insulin aspart  0-5 Units Subcutaneous QHS  . insulin aspart  7 Units Subcutaneous TID WC  . insulin glargine  18 Units Subcutaneous QHS  . ipratropium  0.5 mg Nebulization TID   And  . levalbuterol  0.63 mg Nebulization TID  . mouth rinse  15 mL Mouth Rinse BID  . methylPREDNISolone (SOLU-MEDROL) injection  40 mg Intravenous Q12H  . saccharomyces boulardii  250 mg Oral BID  . sulfacetamide  1 drop Both Eyes QID   Continuous Infusions: . piperacillin-tazobactam (ZOSYN)  IV 3.375 g (08/20/17 0919)  . vancomycin 1,000 mg (08/19/17 1606)   PRN Meds:.acetaminophen, levalbuterol   Antibiotics   Anti-infectives    Start     Dose/Rate Route Frequency Ordered Stop   08/19/17 1800  piperacillin-tazobactam (ZOSYN) IVPB 3.375 g     3.375 g 12.5 mL/hr over 240 Minutes Intravenous Every 8 hours 08/19/17 1315     08/18/17 1600  vancomycin (VANCOCIN) IVPB 1000 mg/200 mL premix     1,000 mg 200 mL/hr over 60 Minutes Intravenous Every 24 hours 08/17/17 1631     08/17/17 2345  piperacillin-tazobactam (ZOSYN) IVPB 3.375 g  Status:  Discontinued     3.375 g 12.5 mL/hr over 240 Minutes Intravenous Every 8 hours 08/17/17 1631 08/19/17 1315   08/17/17 1545   piperacillin-tazobactam (ZOSYN) IVPB 3.375 g     3.375 g 100 mL/hr over 30 Minutes Intravenous  Once 08/17/17 1530 08/17/17 1616   08/17/17 1545  vancomycin (VANCOCIN) IVPB 1000 mg/200 mL premix     1,000 mg 200 mL/hr over 60 Minutes Intravenous  Once 08/17/17 1530 08/17/17 1657        Subjective:   Travis Coleman was seen and examined today. Feeling a lot better today, shortness of breath improving, no chest pain. No fevers. Patient denies dizziness, abdominal pain, N/V/D/C, new weakness, numbess, tingling.   Objective:   Vitals:   08/20/17 0807 08/20/17 0809 08/20/17 1048 08/20/17 1157  BP:    (!) 127/58  Pulse:    90  Resp:    Marland Kitchen)  21  Temp:    (!) 97.5 F (36.4 C)  TempSrc:    Oral  SpO2: 100% 100% 99% 99%  Weight:      Height:        Intake/Output Summary (Last 24 hours) at 08/20/17 1229 Last data filed at 08/20/17 1159  Gross per 24 hour  Intake           810.21 ml  Output             1550 ml  Net          -739.79 ml     Wt Readings from Last 3 Encounters:  08/17/17 95.7 kg (210 lb 15.7 oz)  08/16/17 95 kg (209 lb 6.4 oz)  08/15/17 95 kg (209 lb 6.4 oz)     Exam  General: Alert and awake Eyes:  HEENT:  Cardiovascular: S1 S2 auscultated, no rubs, murmurs or gallops. Regular rate and rhythm. 1+ pedal edema b/l Respiratory:  Coarse breath sounds bilaterally but improving Gastrointestinal: Soft, nontender, nondistended, + bowel sounds Ext: 1+ pedal edema bilaterally Neuro: no new deficits  Musculoskeletal: No digital cyanosis, clubbing Skin: No rashes Psych: Normal affect and demeanor     Data Reviewed:  I have personally reviewed following labs and imaging studies  Micro Results Recent Results (from the past 240 hour(s))  Blood Culture (routine x 2)     Status: Abnormal   Collection Time: 08/17/17  2:45 PM  Result Value Ref Range Status   Specimen Description BLOOD RIGHT HAND  Final   Special Requests   Final    BOTTLES DRAWN AEROBIC AND  ANAEROBIC Blood Culture adequate volume   Culture  Setup Time   Final    GRAM POSITIVE COCCI IN CLUSTERS IN BOTH AEROBIC AND ANAEROBIC BOTTLES CRITICAL RESULT CALLED TO, READ BACK BY AND VERIFIED WITH: M MACCIA,PHARMD AT 1113 08/18/17 BY L BENFIELD    Culture (A)  Final    STAPHYLOCOCCUS SPECIES (COAGULASE NEGATIVE) THE SIGNIFICANCE OF ISOLATING THIS ORGANISM FROM A SINGLE SET OF BLOOD CULTURES WHEN MULTIPLE SETS ARE DRAWN IS UNCERTAIN. PLEASE NOTIFY THE MICROBIOLOGY DEPARTMENT WITHIN ONE WEEK IF SPECIATION AND SENSITIVITIES ARE REQUIRED.    Report Status 08/20/2017 FINAL  Final  Blood Culture ID Panel (Reflexed)     Status: Abnormal   Collection Time: 08/17/17  2:45 PM  Result Value Ref Range Status   Enterococcus species NOT DETECTED NOT DETECTED Final   Listeria monocytogenes NOT DETECTED NOT DETECTED Final   Staphylococcus species DETECTED (A) NOT DETECTED Final    Comment: Methicillin (oxacillin) resistant coagulase negative staphylococcus. Possible blood culture contaminant (unless isolated from more than one blood culture draw or clinical case suggests pathogenicity). No antibiotic treatment is indicated for blood  culture contaminants. CRITICAL RESULT CALLED TO, READ BACK BY AND VERIFIED WITH: M MACCIA,PHARMD AT 1113 08/18/17 BY L BENFIELD    Staphylococcus aureus NOT DETECTED NOT DETECTED Final   Methicillin resistance DETECTED (A) NOT DETECTED Final    Comment: CRITICAL RESULT CALLED TO, READ BACK BY AND VERIFIED WITH: M MACCIA,PHARMD AT 1113 08/18/17 BY L BENFIELD    Streptococcus species NOT DETECTED NOT DETECTED Final   Streptococcus agalactiae NOT DETECTED NOT DETECTED Final   Streptococcus pneumoniae NOT DETECTED NOT DETECTED Final   Streptococcus pyogenes NOT DETECTED NOT DETECTED Final   Acinetobacter baumannii NOT DETECTED NOT DETECTED Final   Enterobacteriaceae species NOT DETECTED NOT DETECTED Final   Enterobacter cloacae complex NOT DETECTED NOT DETECTED Final  Escherichia coli NOT DETECTED NOT DETECTED Final   Klebsiella oxytoca NOT DETECTED NOT DETECTED Final   Klebsiella pneumoniae NOT DETECTED NOT DETECTED Final   Proteus species NOT DETECTED NOT DETECTED Final   Serratia marcescens NOT DETECTED NOT DETECTED Final   Haemophilus influenzae NOT DETECTED NOT DETECTED Final   Neisseria meningitidis NOT DETECTED NOT DETECTED Final   Pseudomonas aeruginosa NOT DETECTED NOT DETECTED Final   Candida albicans NOT DETECTED NOT DETECTED Final   Candida glabrata NOT DETECTED NOT DETECTED Final   Candida krusei NOT DETECTED NOT DETECTED Final   Candida parapsilosis NOT DETECTED NOT DETECTED Final   Candida tropicalis NOT DETECTED NOT DETECTED Final  Blood Culture (routine x 2)     Status: None (Preliminary result)   Collection Time: 08/17/17  3:39 PM  Result Value Ref Range Status   Specimen Description BLOOD RIGHT HAND  Final   Special Requests   Final    BOTTLES DRAWN AEROBIC AND ANAEROBIC Blood Culture adequate volume   Culture NO GROWTH 2 DAYS  Final   Report Status PENDING  Incomplete  Respiratory Panel by PCR     Status: Abnormal   Collection Time: 08/17/17  3:52 PM  Result Value Ref Range Status   Adenovirus NOT DETECTED NOT DETECTED Final   Coronavirus 229E NOT DETECTED NOT DETECTED Final   Coronavirus HKU1 NOT DETECTED NOT DETECTED Final   Coronavirus NL63 NOT DETECTED NOT DETECTED Final   Coronavirus OC43 NOT DETECTED NOT DETECTED Final   Metapneumovirus NOT DETECTED NOT DETECTED Final   Rhinovirus / Enterovirus DETECTED (A) NOT DETECTED Final   Influenza A NOT DETECTED NOT DETECTED Final   Influenza B NOT DETECTED NOT DETECTED Final   Parainfluenza Virus 1 NOT DETECTED NOT DETECTED Final   Parainfluenza Virus 2 NOT DETECTED NOT DETECTED Final   Parainfluenza Virus 3 NOT DETECTED NOT DETECTED Final   Parainfluenza Virus 4 NOT DETECTED NOT DETECTED Final   Respiratory Syncytial Virus NOT DETECTED NOT DETECTED Final   Bordetella  pertussis NOT DETECTED NOT DETECTED Final   Chlamydophila pneumoniae NOT DETECTED NOT DETECTED Final   Mycoplasma pneumoniae NOT DETECTED NOT DETECTED Final  Urine culture     Status: None   Collection Time: 08/17/17  3:54 PM  Result Value Ref Range Status   Specimen Description URINE, CATHETERIZED  Final   Special Requests NONE  Final   Culture NO GROWTH  Final   Report Status 08/18/2017 FINAL  Final  MRSA PCR Screening     Status: None   Collection Time: 08/17/17  8:05 PM  Result Value Ref Range Status   MRSA by PCR NEGATIVE NEGATIVE Final    Comment:        The GeneXpert MRSA Assay (FDA approved for NASAL specimens only), is one component of a comprehensive MRSA colonization surveillance program. It is not intended to diagnose MRSA infection nor to guide or monitor treatment for MRSA infections.   Urine culture     Status: None   Collection Time: 08/18/17 10:17 AM  Result Value Ref Range Status   Specimen Description URINE, CATHETERIZED  Final   Special Requests NONE  Final   Culture NO GROWTH  Final   Report Status 08/19/2017 FINAL  Final    Radiology Reports US Renal  Result Date: 08/19/2017 CLINICAL DATA:  Acute renal injury. History of chronic renal insufficiency, diabetes, and hypertension. EXAM: RENAL / URINARY TRACT ULTRASOUND COMPLETE COMPARISON:  Noncontrast abdominopelvic CT scan dated November 03, 2015 FINDINGS: Right  Kidney: Length: 9.8 cm. The renal cortical echotexture is increased. There is diffuse cortical thinning. There is no focal mass nor hydronephrosis. Left Kidney: Length: 10.7 cm. The cortical echotexture is increased similar to that on the right. There is mild cortical thinning diffusely. There is no focal mass or hydronephrosis. Bladder: A Foley catheter is present in the partially distended urinary bladder. IMPRESSION: Bilateral cortical atrophy and increased cortical echotexture consistent with medical renal disease. No hydronephrosis. Electronically  Signed   By: David  Martinique M.D.   On: 08/19/2017 11:32   Nm Pulmonary Perf And Vent  Result Date: 08/18/2017 CLINICAL DATA:  83 year old male with acute shortness of breath for 2-3 days. EXAM: NUCLEAR MEDICINE VENTILATION - PERFUSION LUNG SCAN TECHNIQUE: Ventilation images were obtained in multiple projections using inhaled aerosol Tc-36mDTPA. Perfusion images were obtained in multiple projections after intravenous injection of Tc-938mAA. RADIOPHARMACEUTICALS:  31.7 mCi Technetium-9986mPA aerosol inhalation and 4.2 mCi Technetium-71m43m IV COMPARISON:  08/17/2017 chest radiograph FINDINGS: Ventilation: No focal ventilation defect. Central clumping is present. Perfusion: No wedge shaped peripheral perfusion defects to suggest acute pulmonary embolism. IMPRESSION: No evidence of pulmonary embolism.  No VQ mismatches. Electronically Signed   By: JeffMargarette Canada.   On: 08/18/2017 10:27   Dg Chest Port 1 View  Result Date: 08/18/2017 CLINICAL DATA:  85 y18r old male with shortness of breath. EXAM: PORTABLE CHEST 1 VIEW COMPARISON:  08/17/2017 and prior exams FINDINGS: Cardiomegaly and CABG changes again noted. Pulmonary vascular congestion noted with possible mild interstitial edema. No airspace disease, pleural effusion or pneumothorax. IMPRESSION: Cardiomegaly with pulmonary vascular congestion and possible mild interstitial edema. Electronically Signed   By: JeffMargarette Canada.   On: 08/18/2017 11:07   Dg Chest Portable 1 View  Result Date: 08/17/2017 CLINICAL DATA:  SOB today. Pt here from nursing home. On BiPAP at time of imaging. Hx of HTN, CHF, DM, CAD, CKD. EXAM: PORTABLE CHEST 1 VIEW COMPARISON:  11/03/2015 FINDINGS: Status post median sternotomy and CABG. The heart is enlarged. There are no focal consolidations or pleural effusions. No pulmonary edema. IMPRESSION: Stable cardiomegaly. Electronically Signed   By: ElizNolon Nations.   On: 08/17/2017 15:25   Dg Swallowing Func-speech  Pathology  Result Date: 08/19/2017 Objective Swallowing Evaluation: Type of Study: MBS-Modified Barium Swallow Study Patient Details Name: DonaLEGRAND LASSER: [id number] of Birth: [birth year]'s Date: 08/19/2017 Time: SLP Start Time (ACUTE ONLY): 1002-SLP Stop Time (ACUTE ONLY): 1017 SLP Time Calculation (min) (ACUTE ONLY): 15 min Past Medical History: Past Medical History: Diagnosis Date . Anemia  . Bradycardia  . CAD (coronary artery disease) of bypass graft   Multivessel . Cataract   bilateral . CHF (congestive heart failure) (HCC)[place] Chronic kidney disease   Stage 3 . Dementia   [facility] . Diabetes mellitus  . Dysphagia  . Hearing deficit  . Hyperlipidemia  . Hypertension  . Neuropathy  . PVD (peripheral vascular disease) (HCC)[place] Thrombocytopenia (HCC)[place] Surgical History: Past Surgical History: Procedure Laterality Date . CORONARY ARTERY BYPASS GRAFT   . PERIPHERAL VASCULAR CATHETERIZATION N/A 11/28/2016  Procedure: Abdominal Aortogram w/Lower Extremity;  Surgeon: BranWaynetta Sandy;  [facility];  Service: Cardiovascular;  Laterality: N/A; HPI: 85 y75r old male who is HOH with history of CAD status post CABG, CHF, chronic kidney disease stage III, advanced dementia, hypertension, hyperlipidemia, [facility].  Dx Acute on chronic resp failure with clinical pna, sepsis, NSTEMI. Objective test recommended following meal observation.    Subjective: alert, confused, asking to help him get out of here Assessment / Plan / Recommendation CHL IP CLINICAL IMPRESSIONS 08/19/2017 Clinical Impression Mild oral impairments marked by decreased cohesion, delayed bolus formation and lingual residue spilling over tongue. Laryngeal penetration to vocal cords with thin due to decreased timing and coordination of swallow which remained on anterior vestibule slightly without  awareness. Bolus in pyriform sinuses before initiation of hyoid elevation and excursion. Mild-moderate vallecular residue reduced to mild given cues for second swallow. Appeared to have intermittent tightness of UES. Continue Dys 2, nectar thick liquids, crush meds, full assist for safety and feeding assist and ST.    SLP Visit Diagnosis Dysphagia, oropharyngeal phase (R13.12) Attention and concentration deficit following -- Frontal lobe and executive function deficit following -- Impact on safety and function Moderate aspiration risk   CHL IP TREATMENT RECOMMENDATION 08/19/2017 Treatment Recommendations Therapy as outlined in treatment plan below   Prognosis 08/19/2017 Prognosis for Safe Diet Advancement Fair Barriers to Reach Goals -- Barriers/Prognosis Comment -- CHL IP DIET RECOMMENDATION 08/19/2017 SLP Diet Recommendations Dysphagia 2 (Fine chop) solids;Nectar thick liquid Liquid Administration via No straw;Cup Medication Administration Crushed with puree Compensations Minimize environmental distractions;Small sips/bites;Slow rate;Lingual sweep for clearance of pocketing Postural Changes Seated upright at 90 degrees   CHL IP OTHER RECOMMENDATIONS 08/19/2017 Recommended Consults -- Oral Care Recommendations Oral care BID Other Recommendations --   CHL IP FOLLOW UP RECOMMENDATIONS 08/19/2017 Follow up Recommendations Skilled Nursing facility   [facility] IP FREQUENCY AND DURATION 08/19/2017 Speech Therapy Frequency (ACUTE ONLY) min 2x/week Treatment Duration 2 weeks      CHL IP ORAL PHASE 08/19/2017 Oral Phase Impaired Oral - Pudding Teaspoon -- Oral - Pudding Cup -- Oral - Honey Teaspoon -- Oral - Honey Cup -- Oral - Nectar Teaspoon -- Oral - Nectar Cup Delayed oral transit;Decreased bolus cohesion Oral - Nectar Straw -- Oral - Thin Teaspoon -- Oral - Thin Cup Decreased bolus cohesion Oral - Thin Straw -- Oral - Puree Decreased bolus cohesion;Delayed oral transit Oral - Mech Soft -- Oral - Regular -- Oral - Multi-Consistency  -- Oral - Pill -- Oral Phase - Comment --  CHL IP PHARYNGEAL PHASE 08/19/2017 Pharyngeal Phase Impaired Pharyngeal- Pudding Teaspoon -- Pharyngeal -- Pharyngeal- Pudding Cup -- Pharyngeal -- Pharyngeal- Honey Teaspoon -- Pharyngeal -- Pharyngeal- Honey Cup NT Pharyngeal -- Pharyngeal- Nectar Teaspoon -- Pharyngeal -- Pharyngeal- Nectar Cup Pharyngeal residue - valleculae;Reduced epiglottic inversion Pharyngeal -- Pharyngeal- Nectar Straw -- Pharyngeal -- Pharyngeal- Thin Teaspoon -- Pharyngeal -- Pharyngeal- Thin Cup Delayed swallow initiation-pyriform sinuses;Penetration/Aspiration during swallow Pharyngeal Material enters airway, CONTACTS cords and not ejected out Pharyngeal- Thin Straw -- Pharyngeal -- Pharyngeal- Puree -- Pharyngeal -- Pharyngeal- Mechanical Soft -- Pharyngeal -- Pharyngeal- Regular -- Pharyngeal -- Pharyngeal- Multi-consistency -- Pharyngeal -- Pharyngeal- Pill -- Pharyngeal -- Pharyngeal Comment --  CHL IP CERVICAL ESOPHAGEAL PHASE 08/19/2017 Cervical Esophageal Phase Impaired Pudding Teaspoon -- Pudding Cup -- Honey Teaspoon -- Honey Cup -- Nectar Teaspoon -- Nectar Cup -- Nectar Straw -- Thin Teaspoon -- Thin Cup -- Thin Straw -- Puree -- Mechanical Soft -- Regular -- Multi-consistency -- Pill -- Cervical Esophageal Comment -- CHL IP GO 12/06/2013 Functional Assessment Tool Used skilled clinical judgememt Functional Limitations Swallowing Swallow Current Status (T5176) CI Swallow Goal Status (H6073) CI Swallow Discharge Status (X1062) CI Motor Speech Current Status (I9485) (None) Motor Speech Goal  Status [phone]) (None) Motor Speech Goal Status [phone]) (None) Spoken Language Comprehension Current Status [phone]) (None) Spoken Language Comprehension Goal Status [phone]) (None) Spoken Language Comprehension Discharge Status [phone]) (None) Spoken Language Expression Current Status [phone]) (None) Spoken Language Expression Goal Status [phone]) (None) Spoken Language Expression Discharge Status [phone])  (None) Attention Current Status (J8841) (None) Attention Goal Status (Y6063) (None) Attention Discharge Status [phone]) (None) Memory Current Status (U9323) (None) Memory Goal Status (F5732) (None) Memory Discharge Status (K0254) (None) Voice Current Status (Y7062) (None) Voice Goal Status (B7628) (None) Voice Discharge Status (B1517) (None) Other Speech-Language Pathology Functional Limitation Current Status (O1607) (None) Other Speech-Language Pathology Functional Limitation Goal Status (P7106) (None) Other Speech-Language Pathology Functional Limitation Discharge Status [phone]) (None) Houston Siren 08/19/2017, 11:45 AM  Orbie Pyo Colvin Caroli.Ed CCC-SLP Pager [phone]              Lab Data:  CBC:  Recent Labs Lab 08/17/17 1445 08/18/17 0217 08/19/17 0328 08/20/17 0212  WBC 5.4 3.8* 6.6 9.8  HGB 10.2* 9.0* 9.0* 8.6*  HCT 32.6* 28.2* 27.5* 27.0*  MCV 97.9 97.9 96.5 95.4  PLT 130* 104* 129* 500   Basic Metabolic Panel:  Recent Labs Lab 08/17/17 1445 08/18/17 0217 08/19/17 0328 08/20/17 0212  NA 138 136 135 138  K 4.8 5.3* 4.5 4.1  CL 108 108 107 109  CO2 18* 19* 18* 18*  GLUCOSE 293* 373* 365* 395*  BUN 48* 51* 64* 83*  CREATININE 2.37* 2.35* 2.44* 2.90*  CALCIUM 7.9* 7.1* 7.2* 7.3*   GFR: Estimated Creatinine Clearance: 22.4 mL/min (A) (by C-G formula based on SCr of 2.9 mg/dL (H)). Liver Function Tests: No results for input(s): AST, ALT, ALKPHOS, BILITOT, PROT, ALBUMIN in the last 168 hours. No results for input(s): LIPASE, AMYLASE in the last 168 hours. No results for input(s): AMMONIA in the last 168 hours. Coagulation Profile:  Recent Labs Lab 08/17/17 1539  INR 1.16   Cardiac Enzymes:  Recent Labs Lab 08/17/17 1539 08/17/17 2015 08/18/17 0217 08/18/17 0936  TROPONINI 3.10* 4.24* 3.18* 1.68*   BNP (last 3 results) No results for input(s): PROBNP in the last 8760 hours. HbA1C:  Recent Labs  08/17/17 2015 08/18/17 1157  HGBA1C 7.2* 7.1*    CBG:  Recent Labs Lab 08/19/17 1205 08/19/17 1615 08/19/17 2136 08/20/17 0758 08/20/17 1156  GLUCAP 438* 308* 387* 404* 390*   Lipid Profile: No results for input(s): CHOL, HDL, LDLCALC, TRIG, CHOLHDL, LDLDIRECT in the last 72 hours. Thyroid Function Tests: No results for input(s): TSH, T4TOTAL, FREET4, T3FREE, THYROIDAB in the last 72 hours. Anemia Panel: No results for input(s): VITAMINB12, FOLATE, FERRITIN, TIBC, IRON, RETICCTPCT in the last 72 hours. Urine analysis:    Component Value Date/Time   COLORURINE YELLOW 08/17/2017 1554   APPEARANCEUR CLOUDY (A) 08/17/2017 1554   LABSPEC 1.017 08/17/2017 1554   PHURINE 5.0 08/17/2017 1554   GLUCOSEU NEGATIVE 08/17/2017 1554   HGBUR NEGATIVE 08/17/2017 [place] 08/17/2017 1554   KETONESUR NEGATIVE 08/17/2017 1554   PROTEINUR >=300 (A) 08/17/2017 1554   UROBILINOGEN 0.2 06/28/2014 2020   NITRITE NEGATIVE 08/17/2017 1554   LEUKOCYTESUR SMALL (A) 08/17/2017 1554     Sid Greener M.D. [facility] 08/20/2017, 12:29 PM  Pager: [phone] Between 7am to 7pm - call Pager - [phone]  After 7pm go to [email or web address] - password [email or web address]  Call night coverage person covering after 7pm

## 2017-08-20 NOTE — Progress Notes (Signed)
Inpatient Diabetes Program Recommendations  AACE/ADA: New Consensus Statement on Inpatient Glycemic Control (2015)  Target Ranges:  Prepandial:   less than 140 mg/dL      Peak postprandial:   less than 180 mg/dL (1-2 hours)      Critically ill patients:  140 - 180 mg/dL   Lab Results  Component Value Date   GLUCAP 390 (H) 08/20/2017   HGBA1C 7.1 (H) 08/18/2017    Review of Glycemic Control Results for Travis Coleman, Travis Coleman (MRN [id number]) as of 08/20/2017 12:14  Ref. Range 08/19/2017 12:05 08/19/2017 16:15 08/19/2017 21:36 08/20/2017 07:58 08/20/2017 11:56  Glucose-Capillary Latest Ref Range: 65 - 99 mg/dL 409 (H) 811 (H) 914 (H) 404 (H) 390 (H)    Inpatient Diabetes Program Recommendations:   While on steroids, -Increase meal coverage to 7 units tid if eats 50%  Thank you, Billy Fischer. Zymarion Favorite, RN, MSN, CDE  Diabetes Coordinator Inpatient Glycemic Control Team Team Pager [phone] (8am-5pm) 08/20/2017 12:14 PM

## 2017-08-20 NOTE — Consult Note (Addendum)
Consultation Note Date: 08/20/2017   Patient Name: Travis Coleman  DOB: [birth year]  MRN: [id number]  Age / Sex: 83 y.o., male  PCP: Travis Poll, MD Referring Physician: [name], MD  Reason for Consultation: Establishing goals of care  HPI/Patient Profile: 83 y.o. male  with past medical history of vascular dementia, PVD, CAD s/p CABG, CHF, CKD 3, DM, HTN admitted on 08/17/2017 with SOB, respiratory failure requiring bipap, sepsis. Workup reveals positive rhinovirus/enterovirus. Palliative Medicine consulted for [name]. This is patient's first admission in the last 6 months.  Clinical Assessment and Goals of Care: I have reviewed medical records including EPIC notes, labs and imaging, assessed the patient and then met at the bedside along with patient's sister who is patient's legal guardian to discuss diagnosis prognosis, [name], EOL wishes, disposition and options.  I introduced Palliative Medicine as specialized medical care for people living with serious illness. It focuses on providing relief from the symptoms and stress of a serious illness. The goal is to improve quality of life for both the patient and the family.  We discussed a brief life review of the patient. His sister notes he has always had cognitive deficits. He has been dependent on family his entire life for care. Did not hold a job. He enjoyed coloring and art. She notes he was diagnosed with schizophrenia in adulthood.   As far as functional and nutritional status- he is wheelchair bound, nonambulatory.  He was living in assisted living approx 3 years ago and had a fall, then placed in rehab. He has been living at [name] since then. (Chart review shows patient placed at [name] for rehab d/t deconditioning in December 2016 following hospitalization for gastroenteritis).   Travis Coleman does not feel he has had adequate care at [name] and  doesn't believe there were adequate attempts made to engage patient in physical therapy for rehab.    We discussed their current illness and what it means in the larger context of their on-going co-morbidities.  Natural disease trajectory and expectations at EOL were discussed. Specifically we discussed the trajectory and prognosis of vascular dementia and chronic kidney disease. We discussed the likelihood of a recurrent pneumonia in the future, and possible limits to care.   I attempted to elicit values and goals of care important to the patient. Travis Coleman feels patient's quality of life right now is not great- but her goal is to complete course of care and return patient to SNF. She is interested in finding a different SNF as she is unhappy with his care at [name].   The difference between aggressive medical intervention and comfort care was considered in light of the patient's goals of care. Travis Coleman would not want interventions that would not improve patient's functional state or would leave her brother in a worse functional state.   Advanced directives, concepts specific to code status, artifical feeding and hydration, and rehospitalization were considered and discussed.MOST form was completed. Travis Coleman would not choose artificial feeding, would opt for her brother to return  to hospital for limited interventions if necessary- including antibiotics (but would consider comfort care in the future if he experienced an aspiration pneumonia). Travis Coleman chooses limited code status- no chest compressions, no cardiac medications, no intubation or mechanical ventilation. The only intervention she would choose is noninvasive airway support including Bipap.   Hospice and Palliative Care services outpatient were explained and offered. Recommend Palliative Care services follow patient when he returns to SNF.  Questions and concerns were addressed.  Hard Choices booklet left for review. The family was encouraged to call with  questions or concerns.   Primary Decision Maker LEGAL GUARDIAN - patient's sister- Travis Coleman    SUMMARY OF RECOMMENDATIONS -Limited Code status- no interventions except Bipap ok -Continue full scope care -Treat what is treatable and return to SNF -Recommend palliative follow outpatient for decline of vascular dementia -Social work consult to discuss other placement options with patient's sister as she is unhappy with patient's care    Code Status/Advance Care Planning:  Limited code - bipap ok  Palliative Prophylaxis:   Aspiration and Delirium Protocol  Prognosis:    < 12 months due to overall downward trajectory in last year. This hospitalization may be the beginning of a continued downward decline.  Discharge Planning: [place] with Pallitive f/u  Primary Diagnoses: Present on Admission: . Acute on chronic respiratory failure with hypoxia ([place]) . CAD (coronary artery disease) s/p CABG . Chronic kidney disease (CKD), stage III (moderate) (HCC) . Dyslipidemia associated with type 2 diabetes mellitus ([place]) . 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 Sepsis ([place]) . Acute respiratory failure ([place]) . HCAP (healthcare-associated pneumonia) . NSTEMI (non-ST elevated myocardial infarction) ([place])   I have reviewed the medical record, interviewed the patient and family, and examined the patient. The following aspects are pertinent.  Past Medical History:  Diagnosis Date  . Anemia   . Bradycardia   . CAD (coronary artery disease) of bypass graft    Multivessel  . Cataract    bilateral  . CHF (congestive heart failure) ([place])   . Chronic kidney disease    Stage 3  . Dementia    MMSE 18/30 Feb 2012 [facility]), with behavioral disturbances  . Diabetes mellitus   . Dysphagia   . Hearing deficit   . Hyperlipidemia   . Hypertension   . Neuropathy   . PVD (peripheral vascular disease) ([place])   . Thrombocytopenia [facility])    Social History    Social History  . Marital status: Single    Spouse name: N/A  . Number of children: N/A  . Years of education: N/A   Social History Main Topics  . Smoking status: Former Smoker    Quit date: 11/19/1948  . Smokeless tobacco: Never Used  . Alcohol use No  . Drug use: No  . Sexual activity: Not Currently   Other Topics Concern  . None   Social History Narrative  . None   Family History  Problem Relation Age of Onset  . Diabetes Mother   . Dementia Mother        alzheimer  . Dementia Father   . Stroke Father   . Heart disease Father   . Dementia Sister    Scheduled Meds: . amLODipine  2.5 mg Oral Daily  . arformoterol  15 mcg Nebulization BID  . aspirin EC  81 mg Oral Daily  . atorvastatin  20 mg Oral QHS  . budesonide (PULMICORT) nebulizer solution  0.25 mg Nebulization BID  .  cholecalciferol  2,000 Units Oral Daily  . divalproex  250 mg Oral TID  . memantine  28 mg Oral QHS   And  . donepezil  10 mg Oral QHS  . ferrous sulfate  325 mg Oral Q breakfast  . gabapentin  200 mg Oral QHS  . heparin  5,000 Units Subcutaneous Q8H  . hydrALAZINE  50 mg Oral Q8H  . insulin aspart  0-15 Units Subcutaneous TID WC  . insulin aspart  0-5 Units Subcutaneous QHS  . insulin aspart  7 Units Subcutaneous TID WC  . insulin glargine  18 Units Subcutaneous QHS  . ipratropium  0.5 mg Nebulization TID   And  . levalbuterol  0.63 mg Nebulization TID  . mouth rinse  15 mL Mouth Rinse BID  . [START ON 08/21/2017] predniSONE  40 mg Oral Q breakfast  . saccharomyces boulardii  250 mg Oral BID  . sulfacetamide  1 drop Both Eyes QID   Continuous Infusions: . piperacillin-tazobactam (ZOSYN)  IV Stopped (08/20/17 1319)  . [START ON 08/21/2017] vancomycin     PRN Meds:.acetaminophen, levalbuterol Medications Prior to Admission:  Prior to Admission medications   Medication Sig Start Date End Date Taking? Authorizing Provider  acetaminophen (TYLENOL) 650 MG CR tablet Take 650 mg by mouth  every 6 (six) hours.   Yes [provider]  aspirin EC 81 MG tablet Take 81 mg by mouth daily.   Yes [provider]  atorvastatin (LIPITOR) 20 MG tablet Take 20 mg by mouth at bedtime.   Yes [provider]  cholecalciferol (VITAMIN D) 1000 UNITS tablet Take 2,000 Units by mouth daily.   Yes [provider]  divalproex (DEPAKOTE SPRINKLE) 125 MG capsule Take 250 mg by mouth 3 (three) times daily.    Yes [provider]  enalapril (VASOTEC) 20 MG tablet Take 20 mg by mouth daily.   Yes [provider]  Eyelid Cleansers (OCUSOFT EYELID CLEANSING EX) Apply to bilateral eye lids topically one time daily for Bilateral Marginal Blepharitis   Yes [provider]  ferrous sulfate 325 (65 FE) MG tablet Take 325 mg by mouth daily with breakfast.   Yes [provider]  gabapentin (NEURONTIN) 100 MG capsule Take 200 mg by mouth at bedtime.   Yes [provider]  hydrALAZINE (APRESOLINE) 50 MG tablet Take 50 mg by mouth every 8 (eight) hours.   Yes [provider]  insulin aspart (NOVOLOG) 100 UNIT/ML injection Inject as per sliding scale: if  0-59=0 Call MD ; 60-69 = 0 units, 70-150= 10; 151-450 = 13 units; 450 + = 0 unitsCall MD for CBG < 60 or > 450, subcutaneously 3 times daily after meals related to DM.   Yes [provider]  insulin glargine (LANTUS) 100 UNIT/ML injection Inject 8 Units into the skin at bedtime.    Yes [provider]  ipratropium-albuterol (DUONEB) 0.5-2.5 (3) MG/3ML SOLN Take 3 mLs by nebulization every 6 (six) hours as needed.   Yes [provider]  levofloxacin (LEVAQUIN) 750 MG tablet Take 750 mg by mouth daily. 08/16/17 08/30/17 Yes [provider]  linagliptin (TRADJENTA) 5 MG TABS tablet Take 5 mg by mouth daily.   Yes [provider]  Memantine HCl-Donepezil HCl (NAMZARIC) 28-10 MG CP24 Take 1 capsule by mouth at bedtime.    Yes [provider]  saccharomyces boulardii (FLORASTOR) 250 MG capsule Take 250 mg by mouth 2 (two) times daily.   Yes [provider]  sulfacetamide (BLEPH-10) 10 % ophthalmic solution Place 1 drop into both eyes 4 (four) times daily.   Yes [provider]   No Known Allergies Review of Systems  Unable to perform ROS: Dementia    Physical Exam  Constitutional: He appears well-developed and well-nourished. No distress.  Cardiovascular: Normal rate and regular rhythm.   Pulmonary/Chest: He is in respiratory distress. He has wheezes.  Neurological: He is alert.  Pleasantly confused  Skin: Skin is warm and dry.  Nursing note and vitals reviewed.   Vital Signs: BP (!) 130/54   Pulse 90   Temp 98 F (36.7 C) (Oral)   Resp 20   Ht 5' 10"  (1.778 m)   Wt 95.7 kg (210 lb 15.7 oz)   SpO2 98%   BMI 30.27 kg/m  Pain Assessment: Faces       SpO2: SpO2: 98 % O2 Device:SpO2: 98 % O2 Flow Rate: .O2 Flow Rate (L/min): 8 L/min  IO: Intake/output summary:  Intake/Output Summary (Last 24 hours) at 08/20/17 1634 Last data filed at 08/20/17 1525  Gross per 24 hour  Intake           817.71 ml  Output             1650 ml  Net          -832.29 ml    LBM:   Baseline Weight: Weight: 95.7 kg (210 lb 15.7 oz) Most recent weight: Weight: 95.7 kg (210 lb 15.7 oz)     Palliative Assessment/Data: PPS: 30%     Thank you for this consult. Palliative medicine will continue to follow and assist as needed.   Time In: 1500 Time Out: 1645 Time Total: 105 minutes Prolonged services billed: Yes Greater than 50%  of this time was spent counseling and coordinating care related to the above assessment and plan.  Signed by: Mariana Kaufman, AGNP-C Palliative Medicine    Please contact Palliative Medicine Team phone at [phone] for questions and concerns.  For individual provider: See Shea Evans

## 2017-08-20 NOTE — Progress Notes (Signed)
No charge note:   Palliative consult received.   Meeting scheduled for 1515 today with Barrett Henle.   Ocie Bob, AGNP-C Palliative Medicine  Please call Palliative Medicine team phone with any questions [phone]. For individual providers please see AMION.

## 2017-08-20 NOTE — Progress Notes (Signed)
ANTICOAGULATION CONSULT NOTE - Follow Up Consult  Pharmacy Consult for Heparin  Indication: chest pain/ACS  No Known Allergies  Patient Measurements: Height:  (177.8 cm) Weight: 210 lb 15.7 oz (95.7 kg) IBW/kg (Calculated) : 73  Vital Signs: Temp: 98.3 F (36.8 C) (10/02 0614) Temp Source: Axillary (10/02 0614) BP: 152/70 (10/02 0614) Pulse Rate: 72 (10/02 0300)  Labs:  Recent Labs  08/17/17 1539 08/17/17 2015  08/18/17 0217 08/18/17 0936 08/18/17 1123 08/19/17 0328 08/20/17 0212  HGB  --   --   --  9.0*  --   --  9.0* 8.6*  HCT  --   --   --  28.2*  --   --  27.5* 27.0*  PLT  --   --   --  104*  --   --  129* 159  APTT 38*  --   --   --   --   --   --   --   LABPROT 14.7  --   --   --   --   --   --   --   INR 1.16  --   --   --   --   --   --   --   HEPARINUNFRC  --   --   < > 0.33  --  0.25* 0.41 0.47  CREATININE  --   --   --  2.35*  --   --  2.44* 2.90*  TROPONINI 3.10* 4.24*  --  3.18* 1.68*  --   --   --   < > = values in this interval not displayed.  Estimated Creatinine Clearance: 22.4 mL/min (A) (by C-G formula based on SCr of 2.9 mg/dL (H)).  Assessment: Heparin for NSTEMI, heparin level remains therapeutic this morning at 0.47. CBC stable.  Per cardiology is not a candidate for cardiac intervention.  Goal of Therapy:  Heparin level 0.3-0.7 units/ml Monitor platelets by anticoagulation protocol: Yes   Plan:  1. Continue heparin gtt at 1250 units/hr for now 2. Daily heparin level 3. Consider stopping heparin as pt has received > than 48 hours of heparin infusion and no cardiac intervention planned   Pollyann Samples, PharmD, BCPS 08/20/2017, 7:46 AM

## 2017-08-21 DIAGNOSIS — E1165 Type 2 diabetes mellitus with hyperglycemia: Secondary | ICD-10-CM

## 2017-08-21 DIAGNOSIS — E1121 Type 2 diabetes mellitus with diabetic nephropathy: Secondary | ICD-10-CM

## 2017-08-21 DIAGNOSIS — Z7189 Other specified counseling: Secondary | ICD-10-CM

## 2017-08-21 DIAGNOSIS — R0600 Dyspnea, unspecified: Secondary | ICD-10-CM

## 2017-08-21 LAB — BASIC METABOLIC PANEL
Anion gap: 14 (ref 5–15)
BUN: 91 mg/dL — ABNORMAL HIGH (ref 6–20)
CHLORIDE: 108 mmol/L (ref 101–111)
CO2: 18 mmol/L — ABNORMAL LOW (ref 22–32)
Calcium: 7.1 mg/dL — ABNORMAL LOW (ref 8.9–10.3)
Creatinine, Ser: 2.8 mg/dL — ABNORMAL HIGH (ref 0.61–1.24)
GFR calc non Af Amer: 19 mL/min — ABNORMAL LOW (ref >60)
GFR, EST AFRICAN AMERICAN: 23 mL/min — AB (ref >60)
Glucose, Bld: 342 mg/dL — ABNORMAL HIGH (ref 65–99)
POTASSIUM: 4.3 mmol/L (ref 3.5–5.1)
SODIUM: 140 mmol/L (ref 135–145)

## 2017-08-21 LAB — CBC
HCT: 25.9 % — ABNORMAL LOW (ref 39.0–52.0)
HEMOGLOBIN: 8.5 g/dL — AB (ref 13.0–17.0)
MCH: 31.4 pg (ref 26.0–34.0)
MCHC: 32.8 g/dL (ref 30.0–36.0)
MCV: 95.6 fL (ref 78.0–100.0)
Platelets: 153 10*3/uL (ref 150–400)
RBC: 2.71 MIL/uL — AB (ref 4.22–5.81)
RDW: 14.1 % (ref 11.5–15.5)
WBC: 9.9 10*3/uL (ref 4.0–10.5)

## 2017-08-21 LAB — GLUCOSE, CAPILLARY
GLUCOSE-CAPILLARY: 332 mg/dL — AB (ref 65–99)
GLUCOSE-CAPILLARY: 337 mg/dL — AB (ref 65–99)
GLUCOSE-CAPILLARY: 324 mg/dL — AB (ref 65–99)
GLUCOSE-CAPILLARY: 369 mg/dL — AB (ref 65–99)

## 2017-08-21 NOTE — Progress Notes (Signed)
Ref: Florentina Jenny, MD   Subjective:  Feeling better. VS stable. Decreasing shortness of breath. On IV antibiotic.  Objective:  Vital Signs in the last 24 hours: Temp:  [98.4 F (36.9 C)-99.1 F (37.3 C)] 98.9 F (37.2 C) (10/03 1551) Pulse Rate:  [69-82] 75 (10/03 1600) Cardiac Rhythm: Normal sinus rhythm (10/03 0800) Resp:  [15-22] 17 (10/03 1600) BP: (103-162)/(45-66) 148/54 (10/03 1600) SpO2:  [97 %-100 %] 97 % (10/03 1551) FiO2 (%):  [21 %] 21 % (10/03 1409)  Physical Exam: BP Readings from Last 1 Encounters:  08/21/17 (!) 148/54    Wt Readings from Last 1 Encounters:  08/17/17 95.7 kg (210 lb 15.7 oz)    Weight change:  Body mass index is 30.27 kg/m. HEENT: [place]/AT, Eyes-Blue, PERL, EOMI, Conjunctiva-Pink, Sclera-Non-icteric Neck: No JVD, No bruit, Trachea midline. Lungs:  Clear, Bilateral. Cardiac:  Regular rhythm, normal S1 and S2, no S3. II/VI systolic murmur. Abdomen:  Soft, non-tender. BS present. Extremities:  Trace edema present. No cyanosis. No clubbing. CNS: AxOx3, Cranial nerves grossly intact, moves all 4 extremities.  Skin: Warm and dry.   Intake/Output from previous day: 10/02 0701 - 10/03 0700 In: 587.7 [P.O.:240; I.V.:97.7; IV Piggyback:250] Out: 1375 [Urine:1375]    Lab Results: BMET    Component Value Date/Time   NA 140 08/21/2017 0232   NA 138 08/20/2017 0212   NA 135 08/19/2017 0328   NA 145 04/24/2017   NA 144 09/26/2016   NA 141 06/14/2016   K 4.3 08/21/2017 0232   K 4.1 08/20/2017 0212   K 4.5 08/19/2017 0328   CL 108 08/21/2017 0232   CL 109 08/20/2017 0212   CL 107 08/19/2017 0328   CO2 18 (L) 08/21/2017 0232   CO2 18 (L) 08/20/2017 0212   CO2 18 (L) 08/19/2017 0328   GLUCOSE 342 (H) 08/21/2017 0232   GLUCOSE 395 (H) 08/20/2017 0212   GLUCOSE 365 (H) 08/19/2017 0328   BUN 91 (H) 08/21/2017 0232   BUN 83 (H) 08/20/2017 0212   BUN 64 (H) 08/19/2017 0328   BUN 35 (A) 04/24/2017   BUN 29 (A) 09/26/2016   BUN 39 (A)  06/14/2016   CREATININE 2.80 (H) 08/21/2017 0232   CREATININE 2.90 (H) 08/20/2017 0212   CREATININE 2.44 (H) 08/19/2017 0328   CALCIUM 7.1 (L) 08/21/2017 0232   CALCIUM 7.3 (L) 08/20/2017 0212   CALCIUM 7.2 (L) 08/19/2017 0328   GFRNONAA 19 (L) 08/21/2017 0232   GFRNONAA 19 (L) 08/20/2017 0212   GFRNONAA 23 (L) 08/19/2017 0328   GFRAA 23 (L) 08/21/2017 0232   GFRAA 22 (L) 08/20/2017 0212   GFRAA 27 (L) 08/19/2017 0328   CBC    Component Value Date/Time   WBC 9.9 08/21/2017 0232   RBC 2.71 (L) 08/21/2017 0232   HGB 8.5 (L) 08/21/2017 0232   HCT 25.9 (L) 08/21/2017 0232   PLT 153 08/21/2017 0232   MCV 95.6 08/21/2017 0232   MCH 31.4 08/21/2017 0232   MCHC 32.8 08/21/2017 0232   RDW 14.1 08/21/2017 0232   LYMPHSABS 2.2 11/04/2015 0340   MONOABS 0.6 11/04/2015 0340   EOSABS 0.3 11/04/2015 0340   BASOSABS 0.0 11/04/2015 0340   HEPATIC Function Panel No results for input(s): PROT in the last 8760 hours.  Invalid input(s):  ALBUMIN,  AST,  ALT,  ALKPHOS,  BILIDIR,  IBILI HEMOGLOBIN A1C No components found for: HGA1C,  MPG CARDIAC ENZYMES Lab Results  Component Value Date   CKTOTAL 796 (H) 12/26/2010  CKMB (HH) 12/26/2010    10.9 CRITICAL RESULT CALLED TO, READ BACK BY AND VERIFIED WITH: B.RUDIN,RN 12/26/10 1054 BY BSLADE   TROPONINI 1.68 (HH) 08/18/2017   TROPONINI 3.18 (HH) 08/18/2017   TROPONINI 4.24 (HH) 08/17/2017   BNP No results for input(s): PROBNP in the last 8760 hours. TSH No results for input(s): TSH in the last 8760 hours. CHOLESTEROL  Recent Labs  01/25/17  CHOL 145    Scheduled Meds: . amLODipine  2.5 mg Oral Daily  . arformoterol  15 mcg Nebulization BID  . aspirin EC  81 mg Oral Daily  . atorvastatin  20 mg Oral QHS  . budesonide (PULMICORT) nebulizer solution  0.25 mg Nebulization BID  . cholecalciferol  2,000 Units Oral Daily  . divalproex  250 mg Oral TID  . memantine  28 mg Oral QHS   And  . donepezil  10 mg Oral QHS  . ferrous sulfate   325 mg Oral Q breakfast  . gabapentin  200 mg Oral QHS  . heparin  5,000 Units Subcutaneous Q8H  . hydrALAZINE  50 mg Oral Q8H  . insulin aspart  0-15 Units Subcutaneous TID WC  . insulin aspart  0-5 Units Subcutaneous QHS  . insulin aspart  7 Units Subcutaneous TID WC  . insulin glargine  18 Units Subcutaneous QHS  . ipratropium  0.5 mg Nebulization TID   And  . levalbuterol  0.63 mg Nebulization TID  . mouth rinse  15 mL Mouth Rinse BID  . predniSONE  40 mg Oral Q breakfast  . saccharomyces boulardii  250 mg Oral BID  . sulfacetamide  1 drop Both Eyes QID   Continuous Infusions: PRN Meds:.acetaminophen, levalbuterol  Assessment/Plan: Acute on chronic respiratory failure with hypoxia Acute diastolic left heart failure NSTEMI CAD CABG CKD, III Sepsis Dementia Hyperlipidemia  Re-consult as needed.   LOS: 4 days    [name]  MD  08/21/2017, 5:38 PM

## 2017-08-21 NOTE — Clinical Social Work Note (Signed)
CSW left voicemail for patient's sister. Will discuss alternative LTC SNF placement when she calls back.  Charlynn Court, CSW [phone]

## 2017-08-21 NOTE — Progress Notes (Signed)
PROGRESS NOTE    Travis Coleman  RUE:[id number] DOB: 1932-02-06 DOA: 08/17/2017 PCP: Florentina Jenny, MD   Chief Complaint  Patient presents with  . Shortness of Breath    Brief Narrative:  HPI On 08/17/2017 by Dr. Thad Ranger Patient is a 83 year old male with history of CAD status post CABG, CHF, chronic kidney disease stage III, advanced dementia, hypertension, hyperlipidemia, PVD presented from [facility] skilled nursing facility for shortness of breath. History was obtained from the ER records and patient's sister at the bedside. Per patient's sister, patient had shortness of breath in the last 2-3 days at the facility and patient was placed on nebulizer treatments, Levaquin. He also received Lasix yesterday. She did not report any fevers. Today, EMS was called for acute respiratory distress and hypoxia, was placed on BiPAP in ED. Patient was noted to have diffuse rhonchi and wheezing. At baseline, patient is wheelchair bound, does not ambulate. [name] mentioned that patient was coughing and appeared to be choking on food yesterday. At the time of my encounter, patient is on BiPAP, has advanced dementia and unable to provide any history.  Assessment & Plan   Acute on chronic hypoxic respiratory failure -Likely secondary to acute bronchitis/viral source -Presented with acute history distress and wheezing -Recquired BiPAP initially -Currently on supplemental oxygen -continue pulmicort, Brovana -VQ Scan negative  Sepsis secondary to Rhinovirus/Enterovirus -Present with fever, tachycardia, tachypnea -Chest x-ray showed no pneumonia -Respiratory viral panel positive for rhinovirus/enterovirus -influenza PCR negative -Urine strep pneumonia and legionella antigens negative -Blood cultures 1/2 coag-negative staph, likely contaminant -was placed vanc and zosyn- will discontinue for now and monitor  Acute diastolic heart failure -Echocardiogram showed an EF of 60-65%, intermediate  diastolic dysfunction -Lasix held due to worsening renal function -Cardiology consulted and appreciated -Monitor intake and output, daily weights -Urine output over the past 24 hours 1375 mL  NSTEMI  -S/p CABG -Cardiology consulted and appreciated, continue medical  treatment -Echocardiogram showed EF 60-65%, no wall motion abnormalities -Continue aspirin, statin -Was placed on IV heparin which was discontinued on 08/20/2017  Dyslipidemia -Continue statin  Diabetes mellitus, type II with nephropathy, complicated by hyperglycemia -Continue Lantus, insulin sliding scale, CBG monitoring -Hyperglycemia likely secondary to steroid use  Acute kidney injury on chronic kidney disease, stage III -Baseline creatinine from 1.3-1.4 -Advised admission, patient was given fluid due to sepsis however now chest x-ray did show volume overload -IV fluids were discontinued, patient was given IV Lasix however this is held due to worsening creatinine -Renal ultrasound: No obstruction, bilateral cortical atrophy, increased cortical echotexture consistent with medical renal disease -Creatinine currently 2.8 -Continue to monitor BMP  Dementia -Continue Namenda, Aricept  Goals of care -Palliative care consulted and appreciated -Recommended patient have palliative care to follow as an outpatient -Patient has partial code, only BiPAP  DVT Prophylaxis  heparin  Code Status: partial, BiPAP   Family Communication: None at bedside  Disposition Plan: Admitted. SNF at discharge  Consultants Cardiology  Procedures  Echocardiogram Renal US V/Q scan  Antibiotics   Anti-infectives    Start     Dose/Rate Route Frequency Ordered Stop   08/21/17 1606  vancomycin (VANCOCIN) IVPB 1000 mg/200 mL premix     1,000 mg 200 mL/hr over 60 Minutes Intravenous Every 48 hours 08/20/17 1259     08/19/17 1800  piperacillin-tazobactam (ZOSYN) IVPB 3.375 g     3.375 g 12.5 mL/hr over 240 Minutes Intravenous Every  8 hours 08/19/17 1315     08/18/17 1600  vancomycin (VANCOCIN) IVPB 1000 mg/200 mL premix  Status:  Discontinued     1,000 mg 200 mL/hr over 60 Minutes Intravenous Every 24 hours 08/17/17 1631 08/20/17 1259   08/17/17 2345  piperacillin-tazobactam (ZOSYN) IVPB 3.375 g  Status:  Discontinued     3.375 g 12.5 mL/hr over 240 Minutes Intravenous Every 8 hours 08/17/17 1631 08/19/17 1315   08/17/17 1545  piperacillin-tazobactam (ZOSYN) IVPB 3.375 g     3.375 g 100 mL/hr over 30 Minutes Intravenous  Once 08/17/17 1530 08/17/17 1616   08/17/17 1545  vancomycin (VANCOCIN) IVPB 1000 mg/200 mL premix     1,000 mg 200 mL/hr over 60 Minutes Intravenous  Once 08/17/17 1530 08/17/17 1657      Subjective:   Travis Coleman seen and examined today.  Would like to eat, and drink something. Denies chest pain, shortness of breath, abdominal pain.   Objective:   Vitals:   08/21/17 0700 08/21/17 0726 08/21/17 0750 08/21/17 1109  BP:  (!) 141/58  (!) 137/48  Pulse:  70  77  Resp:  18  16  Temp:  99.1 F (37.3 C)    TempSrc: Axillary Axillary    SpO2:  99% 97% 100%  Weight:      Height:        Intake/Output Summary (Last 24 hours) at 08/21/17 1151 Last data filed at 08/21/17 1111  Gross per 24 hour  Intake              460 ml  Output             1675 ml  Net            -1215 ml   Filed Weights   08/17/17 2004 08/17/17 2339  Weight: 95.7 kg (210 lb 15.7 oz) 95.7 kg (210 lb 15.7 oz)    Exam  General: Well developed, well nourished, NAD, appears stated age  HEENT: NCAT, mucous membranes moist.   Neck: Supple, no JVD, no masses  Cardiovascular: S1 S2 auscultated, 2/6SEM, RRR  Respiratory: Coarse breath sounds  Abdomen: Soft, nontender, nondistended, + bowel sounds  Extremities: warm dry without cyanosis clubbing. LE edema B/L   Neuro: AAOx1 (self), nonfocal.   Psych: Appropriate   Data Reviewed: I have personally reviewed following labs and imaging studies  CBC:  Recent  Labs Lab 08/17/17 1445 08/18/17 0217 08/19/17 0328 08/20/17 0212 08/21/17 0232  WBC 5.4 3.8* 6.6 9.8 9.9  HGB 10.2* 9.0* 9.0* 8.6* 8.5*  HCT 32.6* 28.2* 27.5* 27.0* 25.9*  MCV 97.9 97.9 96.5 95.4 95.6  PLT 130* 104* 129* 159 153   Basic Metabolic Panel:  Recent Labs Lab 08/17/17 1445 08/18/17 0217 08/19/17 0328 08/20/17 0212 08/21/17 0232  NA 138 136 135 138 140  K 4.8 5.3* 4.5 4.1 4.3  CL 108 108 107 109 108  CO2 18* 19* 18* 18* 18*  GLUCOSE 293* 373* 365* 395* 342*  BUN 48* 51* 64* 83* 91*  CREATININE 2.37* 2.35* 2.44* 2.90* 2.80*  CALCIUM 7.9* 7.1* 7.2* 7.3* 7.1*   GFR: Estimated Creatinine Clearance: 23.2 mL/min (A) (by C-G formula based on SCr of 2.8 mg/dL (H)). Liver Function Tests: No results for input(s): AST, ALT, ALKPHOS, BILITOT, PROT, ALBUMIN in the last 168 hours. No results for input(s): LIPASE, AMYLASE in the last 168 hours. No results for input(s): AMMONIA in the last 168 hours. Coagulation Profile:  Recent Labs Lab 08/17/17 1539  INR 1.16   Cardiac Enzymes:  Recent Labs Lab 08/17/17  1539 08/17/17 2015 08/18/17 0217 08/18/17 0936  TROPONINI 3.10* 4.24* 3.18* 1.68*   BNP (last 3 results) No results for input(s): PROBNP in the last 8760 hours. HbA1C:  Recent Labs  08/18/17 1157  HGBA1C 7.1*   CBG:  Recent Labs Lab 08/20/17 0758 08/20/17 1156 08/20/17 1520 08/20/17 2131 08/21/17 0734  GLUCAP 404* 390* 420* 370* 332*   Lipid Profile: No results for input(s): CHOL, HDL, LDLCALC, TRIG, CHOLHDL, LDLDIRECT in the last 72 hours. Thyroid Function Tests: No results for input(s): TSH, T4TOTAL, FREET4, T3FREE, THYROIDAB in the last 72 hours. Anemia Panel: No results for input(s): VITAMINB12, FOLATE, FERRITIN, TIBC, IRON, RETICCTPCT in the last 72 hours. Urine analysis:    Component Value Date/Time   COLORURINE YELLOW 08/17/2017 1554   APPEARANCEUR CLOUDY (A) 08/17/2017 1554   LABSPEC 1.017 08/17/2017 1554   PHURINE 5.0  08/17/2017 1554   GLUCOSEU NEGATIVE 08/17/2017 1554   HGBUR NEGATIVE 08/17/2017 1554   BILIRUBINUR NEGATIVE 08/17/2017 1554   KETONESUR NEGATIVE 08/17/2017 1554   PROTEINUR >=300 (A) 08/17/2017 1554   UROBILINOGEN 0.2 06/28/2014 2020   NITRITE NEGATIVE 08/17/2017 1554   LEUKOCYTESUR SMALL (A) 08/17/2017 1554   Sepsis Labs: (procalcitonin:4,lacticidven:4)  ) Recent Results (from the past 240 hour(s))  Blood Culture (routine x 2)     Status: Abnormal   Collection Time: 08/17/17  2:45 PM  Result Value Ref Range Status   Specimen Description BLOOD RIGHT HAND  Final   Special Requests   Final    BOTTLES DRAWN AEROBIC AND ANAEROBIC Blood Culture adequate volume   Culture  Setup Time   Final    GRAM POSITIVE COCCI IN CLUSTERS IN BOTH AEROBIC AND ANAEROBIC BOTTLES CRITICAL RESULT CALLED TO, READ BACK BY AND VERIFIED WITH: M MACCIA,PHARMD AT 1113 08/18/17 BY L BENFIELD    Culture (A)  Final    STAPHYLOCOCCUS SPECIES (COAGULASE NEGATIVE) THE SIGNIFICANCE OF ISOLATING THIS ORGANISM FROM A SINGLE SET OF BLOOD CULTURES WHEN MULTIPLE SETS ARE DRAWN IS UNCERTAIN. PLEASE NOTIFY THE MICROBIOLOGY DEPARTMENT WITHIN ONE WEEK IF SPECIATION AND SENSITIVITIES ARE REQUIRED.    Report Status 08/20/2017 FINAL  Final  Blood Culture ID Panel (Reflexed)     Status: Abnormal   Collection Time: 08/17/17  2:45 PM  Result Value Ref Range Status   Enterococcus species NOT DETECTED NOT DETECTED Final   Listeria monocytogenes NOT DETECTED NOT DETECTED Final   Staphylococcus species DETECTED (A) NOT DETECTED Final    Comment: Methicillin (oxacillin) resistant coagulase negative staphylococcus. Possible blood culture contaminant (unless isolated from more than one blood culture draw or clinical case suggests pathogenicity). No antibiotic treatment is indicated for blood  culture contaminants. CRITICAL RESULT CALLED TO, READ BACK BY AND VERIFIED WITH: M MACCIA,PHARMD AT 1113 08/18/17 BY L BENFIELD     Staphylococcus aureus NOT DETECTED NOT DETECTED Final   Methicillin resistance DETECTED (A) NOT DETECTED Final    Comment: CRITICAL RESULT CALLED TO, READ BACK BY AND VERIFIED WITH: M MACCIA,PHARMD AT 1113 08/18/17 BY L BENFIELD    Streptococcus species NOT DETECTED NOT DETECTED Final   Streptococcus agalactiae NOT DETECTED NOT DETECTED Final   Streptococcus pneumoniae NOT DETECTED NOT DETECTED Final   Streptococcus pyogenes NOT DETECTED NOT DETECTED Final   Acinetobacter baumannii NOT DETECTED NOT DETECTED Final   Enterobacteriaceae species NOT DETECTED NOT DETECTED Final   Enterobacter cloacae complex NOT DETECTED NOT DETECTED Final   Escherichia coli NOT DETECTED NOT DETECTED Final   Klebsiella oxytoca NOT DETECTED NOT DETECTED Final  Klebsiella pneumoniae NOT DETECTED NOT DETECTED Final   Proteus species NOT DETECTED NOT DETECTED Final   Serratia marcescens NOT DETECTED NOT DETECTED Final   Haemophilus influenzae NOT DETECTED NOT DETECTED Final   Neisseria meningitidis NOT DETECTED NOT DETECTED Final   Pseudomonas aeruginosa NOT DETECTED NOT DETECTED Final   Candida albicans NOT DETECTED NOT DETECTED Final   Candida glabrata NOT DETECTED NOT DETECTED Final   Candida krusei NOT DETECTED NOT DETECTED Final   Candida parapsilosis NOT DETECTED NOT DETECTED Final   Candida tropicalis NOT DETECTED NOT DETECTED Final  Blood Culture (routine x 2)     Status: None (Preliminary result)   Collection Time: 08/17/17  3:39 PM  Result Value Ref Range Status   Specimen Description BLOOD RIGHT HAND  Final   Special Requests   Final    BOTTLES DRAWN AEROBIC AND ANAEROBIC Blood Culture adequate volume   Culture NO GROWTH 3 DAYS  Final   Report Status PENDING  Incomplete  Respiratory Panel by PCR     Status: Abnormal   Collection Time: 08/17/17  3:52 PM  Result Value Ref Range Status   Adenovirus NOT DETECTED NOT DETECTED Final   Coronavirus 229E NOT DETECTED NOT DETECTED Final   Coronavirus  HKU1 NOT DETECTED NOT DETECTED Final   Coronavirus NL63 NOT DETECTED NOT DETECTED Final   Coronavirus OC43 NOT DETECTED NOT DETECTED Final   Metapneumovirus NOT DETECTED NOT DETECTED Final   Rhinovirus / Enterovirus DETECTED (A) NOT DETECTED Final   Influenza A NOT DETECTED NOT DETECTED Final   Influenza B NOT DETECTED NOT DETECTED Final   Parainfluenza Virus 1 NOT DETECTED NOT DETECTED Final   Parainfluenza Virus 2 NOT DETECTED NOT DETECTED Final   Parainfluenza Virus 3 NOT DETECTED NOT DETECTED Final   Parainfluenza Virus 4 NOT DETECTED NOT DETECTED Final   Respiratory Syncytial Virus NOT DETECTED NOT DETECTED Final   Bordetella pertussis NOT DETECTED NOT DETECTED Final   Chlamydophila pneumoniae NOT DETECTED NOT DETECTED Final   Mycoplasma pneumoniae NOT DETECTED NOT DETECTED Final  Urine culture     Status: None   Collection Time: 08/17/17  3:54 PM  Result Value Ref Range Status   Specimen Description URINE, CATHETERIZED  Final   Special Requests NONE  Final   Culture NO GROWTH  Final   Report Status 08/18/2017 FINAL  Final  MRSA PCR Screening     Status: None   Collection Time: 08/17/17  8:05 PM  Result Value Ref Range Status   MRSA by PCR NEGATIVE NEGATIVE Final    Comment:        The GeneXpert MRSA Assay (FDA approved for NASAL specimens only), is one component of a comprehensive MRSA colonization surveillance program. It is not intended to diagnose MRSA infection nor to guide or monitor treatment for MRSA infections.   Urine culture     Status: None   Collection Time: 08/18/17 10:17 AM  Result Value Ref Range Status   Specimen Description URINE, CATHETERIZED  Final   Special Requests NONE  Final   Culture NO GROWTH  Final   Report Status 08/19/2017 FINAL  Final      Radiology Studies: No results found.   Scheduled Meds: . amLODipine  2.5 mg Oral Daily  . arformoterol  15 mcg Nebulization BID  . aspirin EC  81 mg Oral Daily  . atorvastatin  20 mg Oral QHS   . budesonide (PULMICORT) nebulizer solution  0.25 mg Nebulization BID  . cholecalciferol  2,000 Units Oral Daily  . divalproex  250 mg Oral TID  . memantine  28 mg Oral QHS   And  . donepezil  10 mg Oral QHS  . ferrous sulfate  325 mg Oral Q breakfast  . gabapentin  200 mg Oral QHS  . heparin  5,000 Units Subcutaneous Q8H  . hydrALAZINE  50 mg Oral Q8H  . insulin aspart  0-15 Units Subcutaneous TID WC  . insulin aspart  0-5 Units Subcutaneous QHS  . insulin aspart  7 Units Subcutaneous TID WC  . insulin glargine  18 Units Subcutaneous QHS  . ipratropium  0.5 mg Nebulization TID   And  . levalbuterol  0.63 mg Nebulization TID  . mouth rinse  15 mL Mouth Rinse BID  . predniSONE  40 mg Oral Q breakfast  . saccharomyces boulardii  250 mg Oral BID  . sulfacetamide  1 drop Both Eyes QID   Continuous Infusions: . piperacillin-tazobactam (ZOSYN)  IV Stopped (08/21/17 1247)  . vancomycin       LOS: 4 days   Time Spent in minutes   30 minutes  Tresia Revolorio D.O. on 08/21/2017 at 11:51 AM  Between 7am to 7pm - Pager - [phone]  After 7pm go to [email or web address] - password [email or web address]  And look for the night coverage person covering for me after hours  [facility] Office  [phone]

## 2017-08-21 NOTE — Progress Notes (Addendum)
  Speech Language Pathology Treatment: Dysphagia  Patient Details Name: Travis Coleman MRN: [id number] DOB: [birth year] Today's Date: 08/21/2017 Time: 4098-1191 SLP Time Calculation (min) (ACUTE ONLY): 11 min  Assessment / Plan / Recommendation Clinical Impression  Significant audible wheezing pre po's which did not worsen during or after trials nectar juice and graham cracker pieces in pudding. Given verbal and visual (written) cues he was unable to initiate effective cough to clear audible congestion. Reflexive cough not present. Oral manipulation within functional limits. He should remain on Dys 2 nectar and is not appropriate to assess upgrade and may not be prior to discharge. Recommend pt work with ST at SNF to determine readiness for repeat objective swallow evaluation for possible upgrade if this is an option given overall medical picture.     HPI HPI: 83 year old male who is HOH with history of CAD status post CABG, CHF, chronic kidney disease stage III, advanced dementia, hypertension, hyperlipidemia, PVD presented from [facility] skilled nursing facility for shortness of breath.  Dx Acute on chronic resp failure with clinical pna, sepsis, NSTEMI. Objective test recommended following meal observation.         SLP Plan  Continue with current plan of care       Recommendations  Diet recommendations: Dysphagia 2 (fine chop);Nectar-thick liquid Liquids provided via: Cup;No straw Medication Administration: Crushed with puree Supervision: Patient able to self feed;Staff to assist with self feeding;Full supervision/cueing for compensatory strategies Compensations: Minimize environmental distractions;Slow rate;Small sips/bites Postural Changes and/or Swallow Maneuvers: Seated upright 90 degrees                Oral Care Recommendations: Oral care BID Follow up Recommendations: Skilled Nursing facility SLP Visit Diagnosis: Dysphagia, oropharyngeal phase (R13.12) Plan: Continue with  current plan of care       GO                Royce Macadamia 08/21/2017, 9:58 AM  Breck Coons Lonell Face.Ed [employer] [phone]

## 2017-08-22 LAB — CBC
HCT: 26.6 % — ABNORMAL LOW (ref 39.0–52.0)
HEMOGLOBIN: 8.5 g/dL — AB (ref 13.0–17.0)
MCH: 30.4 pg (ref 26.0–34.0)
MCHC: 32 g/dL (ref 30.0–36.0)
MCV: 95 fL (ref 78.0–100.0)
Platelets: 174 10*3/uL (ref 150–400)
RBC: 2.8 MIL/uL — AB (ref 4.22–5.81)
RDW: 13.7 % (ref 11.5–15.5)
WBC: 10.3 10*3/uL (ref 4.0–10.5)

## 2017-08-22 LAB — CULTURE, BLOOD (ROUTINE X 2)
CULTURE: NO GROWTH
Special Requests: ADEQUATE

## 2017-08-22 LAB — BASIC METABOLIC PANEL
Anion gap: 11 (ref 5–15)
BUN: 79 mg/dL — AB (ref 6–20)
CALCIUM: 7.3 mg/dL — AB (ref 8.9–10.3)
CO2: 20 mmol/L — ABNORMAL LOW (ref 22–32)
CREATININE: 2.28 mg/dL — AB (ref 0.61–1.24)
Chloride: 111 mmol/L (ref 101–111)
GFR calc Af Amer: 29 mL/min — ABNORMAL LOW (ref >60)
GFR, EST NON AFRICAN AMERICAN: 25 mL/min — AB (ref >60)
Glucose, Bld: 371 mg/dL — ABNORMAL HIGH (ref 65–99)
POTASSIUM: 4.2 mmol/L (ref 3.5–5.1)
SODIUM: 142 mmol/L (ref 135–145)

## 2017-08-22 LAB — GLUCOSE, CAPILLARY
GLUCOSE-CAPILLARY: 303 mg/dL — AB (ref 65–99)
GLUCOSE-CAPILLARY: 285 mg/dL — AB (ref 65–99)
GLUCOSE-CAPILLARY: 281 mg/dL — AB (ref 65–99)
Glucose-Capillary: 283 mg/dL — ABNORMAL HIGH (ref 65–99)

## 2017-08-22 LAB — MAGNESIUM: MAGNESIUM: 2.1 mg/dL (ref 1.7–2.4)

## 2017-08-22 MED ORDER — HYDRALAZINE HCL 20 MG/ML IJ SOLN
INTRAMUSCULAR | Status: AC
  Administered 2017-08-22: 10 mg
  Filled 2017-08-22: qty 1

## 2017-08-22 MED ORDER — HYDRALAZINE HCL 20 MG/ML IJ SOLN
10.0000 mg | Freq: Four times a day (QID) | INTRAMUSCULAR | Status: DC | PRN
  Administered 2017-08-23 – 2017-08-26 (×2): 10 mg via INTRAVENOUS
  Filled 2017-08-22 (×2): qty 1

## 2017-08-22 MED ORDER — LABETALOL HCL 5 MG/ML IV SOLN
5.0000 mg | Freq: Once | INTRAVENOUS | Status: DC
  Filled 2017-08-22: qty 4

## 2017-08-22 NOTE — Clinical Social Work Note (Signed)
CSW received call back from patient's sister. She confirmed that she wants patient to go to another facility. She mentioned [facility] and one on [address]. CSW not familiar with a SNF on [address]. CSW updated FL2 and faxed out referral. Will provide sister with bed offers this afternoon.  Charlynn Court, CSW [phone]

## 2017-08-22 NOTE — Progress Notes (Signed)
Patients bp running 191/89, rechecked bp on other arm and bp was 200/103. Notified md. Gave daily meds and received order for prn bp med. Hydralazine given, will continue to monitor.

## 2017-08-22 NOTE — Clinical Social Work Note (Signed)
CSW left voicemail for patient's sister. Will give list of bed offers so far once she calls back.  Charlynn Court, CSW [phone]

## 2017-08-22 NOTE — [place] FL2 (Signed)
[place] MEDICAID FL2 LEVEL OF CARE SCREENING TOOL     IDENTIFICATION  Patient Name: Travis Coleman Birthdate: 1932/03/22 Sex: male Admission Date (Current Location): 08/17/2017  [facility] and [place] Number:  Producer, television/film/video and Address:  [facility], [address] [place], [place] [address]      Provider Number: [id number]  Attending Physician Name and Address:  Edsel Petrin, DO  Relative Name and Phone Number:       Current Level of Care: Hospital Recommended Level of Care: Skilled Nursing Facility (LTC) Prior Approval Number:    Date Approved/Denied:   PASRR Number: [id number]  Discharge Plan: SNF (LTC)    Current Diagnoses: Patient Active Problem List   Diagnosis Date Noted  . Severe sepsis (HCC)   . Advance care planning   . Goals of care, counseling/discussion   . Palliative care encounter   . Acute on chronic respiratory failure with hypoxia (HCC) 08/17/2017  . Sepsis (HCC) 08/17/2017  . Acute respiratory failure (HCC) 08/17/2017  . HCAP (healthcare-associated pneumonia) 08/17/2017  . NSTEMI (non-ST elevated myocardial infarction) (HCC) 08/17/2017  . Chronic kidney disease (CKD), stage III (moderate) (HCC) 01/23/2017  . Hx of CABG 12/25/2016  . 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 Anemia in chronic kidney disease 11/10/2015  . Hypertensive heart disease with CHF (congestive heart failure) (HCC) 11/08/2015  . Chronic systolic CHF (congestive heart failure) (HCC) 11/08/2015  . Vascular dementia without behavioral disturbance 11/08/2015  . Dyslipidemia associated with type 2 diabetes mellitus (HCC) 11/08/2015  . Bradycardia   . PVD (peripheral vascular disease) (HCC) 01/03/2011  . CAD (coronary artery disease) s/p CABG 01/03/2011    Orientation RESPIRATION BLADDER Height & Weight     Self, Situation, Place  Normal. Has not been on bipap since 9/29: 12/8 at 40%. Continent,  Indwelling catheter Weight: 210 lb 15.7 oz (95.7 kg) Height:   (177.8 cm)  BEHAVIORAL SYMPTOMS/MOOD NEUROLOGICAL BOWEL NUTRITION STATUS  Other (Comment) (Cooperative)  (Vascular dementia without behavioral disturbance) Incontinent Diet (DYS 2, Fluid nectar thick)  AMBULATORY STATUS COMMUNICATION OF NEEDS Skin     Verbally Normal                       Personal Care Assistance Level of Assistance              Functional Limitations Info  Sight, Hearing, Speech Sight Info: Adequate Hearing Info: Impaired Speech Info: Adequate    SPECIAL CARE FACTORS FREQUENCY  Speech therapy             Speech Therapy Frequency: 5 x week      Contractures Contractures Info: Not present    Additional Factors Info  Code Status, Allergies, Isolation Precautions Code Status Info: Partial: Use NIPPV/BiPAp only if indicated Allergies Info: NKDA     Isolation Precautions Info: Droplet     Current Medications (08/22/2017):  This is the current hospital active medication list Current Facility-Administered Medications  Medication Dose Route Frequency Provider Last Rate Last Dose  . acetaminophen (TYLENOL) tablet 650 mg  650 mg Oral Q6H PRN Rai, Ripudeep K, MD      . amLODipine (NORVASC) tablet 2.5 mg  2.5 mg Oral Daily [name], MD   2.5 mg at 08/22/17 0915  . arformoterol (BROVANA) nebulizer solution 15 mcg  15 mcg Nebulization BID Rai, Ripudeep K, MD   15 mcg at 08/22/17 4010  . aspirin EC  tablet 81 mg  81 mg Oral Daily Rai, Ripudeep K, MD   81 mg at 08/22/17 0915  . atorvastatin (LIPITOR) tablet 20 mg  20 mg Oral QHS Rai, Ripudeep K, MD   20 mg at 08/21/17 2308  . budesonide (PULMICORT) nebulizer solution 0.25 mg  0.25 mg Nebulization BID Rai, Ripudeep K, MD   0.25 mg at 08/22/17 0838  . cholecalciferol (VITAMIN D) tablet 2,000 Units  2,000 Units Oral Daily Rai, Ripudeep K, MD   2,000 Units at 08/22/17 0915  . divalproex (DEPAKOTE SPRINKLE) capsule 250 mg  250 mg Oral TID  Rai, Ripudeep K, MD   250 mg at 08/22/17 1001  . memantine (NAMENDA XR) 24 hr capsule 28 mg  28 mg Oral QHS Rai, Ripudeep K, MD   28 mg at 08/21/17 2308   And  . donepezil (ARICEPT) tablet 10 mg  10 mg Oral QHS Rai, Ripudeep K, MD   10 mg at 08/21/17 2308  . ferrous sulfate tablet 325 mg  325 mg Oral Q breakfast Rai, Ripudeep K, MD   325 mg at 08/22/17 0915  . gabapentin (NEURONTIN) capsule 200 mg  200 mg Oral QHS Rai, Ripudeep K, MD   200 mg at 08/21/17 2302  . heparin injection 5,000 Units  5,000 Units Subcutaneous Q8H [name], MD   5,000 Units at 08/22/17 0608  . hydrALAZINE (APRESOLINE) injection 10 mg  10 mg Intravenous Q6H PRN Mikhail, Nita Sells, DO      . hydrALAZINE (APRESOLINE) tablet 50 mg  50 mg Oral Q8H Rai, Ripudeep K, MD   50 mg at 08/22/17 0608  . insulin aspart (novoLOG) injection 0-15 Units  0-15 Units Subcutaneous TID WC Rai, Ripudeep K, MD   11 Units at 08/22/17 0916  . insulin aspart (novoLOG) injection 0-5 Units  0-5 Units Subcutaneous QHS Rai, Ripudeep K, MD   5 Units at 08/21/17 2250  . insulin aspart (novoLOG) injection 7 Units  7 Units Subcutaneous TID WC Rai, Ripudeep K, MD   7 Units at 08/22/17 0916  . insulin glargine (LANTUS) injection 18 Units  18 Units Subcutaneous QHS Rai, Ripudeep K, MD   18 Units at 08/21/17 2307  . ipratropium (ATROVENT) nebulizer solution 0.5 mg  0.5 mg Nebulization TID Rai, Ripudeep K, MD   0.5 mg at 08/22/17 0838   And  . levalbuterol (XOPENEX) nebulizer solution 0.63 mg  0.63 mg Nebulization TID Rai, Ripudeep K, MD   0.63 mg at 08/22/17 8295  . levalbuterol (XOPENEX) nebulizer solution 0.63 mg  0.63 mg Nebulization Q2H PRN Rai, Ripudeep K, MD      . MEDLINE mouth rinse  15 mL Mouth Rinse BID Rai, Ripudeep K, MD   15 mL at 08/22/17 0917  . predniSONE (DELTASONE) tablet 40 mg  40 mg Oral Q breakfast Rai, Ripudeep K, MD   40 mg at 08/22/17 0915  . saccharomyces boulardii (FLORASTOR) capsule 250 mg  250 mg Oral BID Rai, Ripudeep K, MD   250  mg at 08/22/17 0915  . sulfacetamide (BLEPH-10) 10 % ophthalmic solution 1 drop  1 drop Both Eyes QID Rai, Ripudeep K, MD   1 drop at 08/22/17 0917     Discharge Medications: Please see discharge summary for a list of discharge medications.  Relevant Imaging Results:  Relevant Lab Results:   Additional Information SS#: [id number]. Sister is legal guardian. Has been LTC at [facility] since December 2016. Sister wants a different facility.  Margarito Liner, LCSW

## 2017-08-22 NOTE — Progress Notes (Signed)
PROGRESS NOTE    Travis Coleman  UJW:[id number] DOB: 08/09/1932 DOA: 08/17/2017 PCP: Florentina Jenny, MD   Chief Complaint  Patient presents with  . Shortness of Breath    Brief Narrative:  HPI On 08/17/2017 by Dr. Thad Ranger Patient is a 83 year old male with history of CAD status post CABG, CHF, chronic kidney disease stage III, advanced dementia, hypertension, hyperlipidemia, PVD presented from [facility] skilled nursing facility for shortness of breath. History was obtained from the ER records and patient's sister at the bedside. Per patient's sister, patient had shortness of breath in the last 2-3 days at the facility and patient was placed on nebulizer treatments, Levaquin. He also received Lasix yesterday. She did not report any fevers. Today, EMS was called for acute respiratory distress and hypoxia, was placed on BiPAP in ED. Patient was noted to have diffuse rhonchi and wheezing. At baseline, patient is wheelchair bound, does not ambulate. [name] mentioned that patient was coughing and appeared to be choking on food yesterday. At the time of my encounter, patient is on BiPAP, has advanced dementia and unable to provide any history.  Interim history Admitted for respiratory failure with viral infection and CHF exacerbation. Patient had AKI and lasix held. Continues to be volume overloaded. Once creatinine improves, will restart lasix. Antibiotics discontinued.   Assessment & Plan   Acute on chronic hypoxic respiratory failure -Likely secondary to acute bronchitis/viral source -Presented with acute history distress and wheezing -Recquired BiPAP initially -continue pulmicort, Brovana -VQ Scan negative -Currently on room air with oxygen saturations in high 90s  Sepsis secondary to Rhinovirus/Enterovirus -Present with fever, tachycardia, tachypnea -Chest x-ray showed no pneumonia -Respiratory viral panel positive for rhinovirus/enterovirus -influenza PCR negative -Urine  strep pneumonia and legionella antigens negative -Blood cultures 1/2 coag-negative staph, likely contaminant -was placed vanc and zosyn, discontinued on 08/21/2017. Will continue to monitor closely -will obtain repeat CXR tomorrow morning  Acute diastolic heart failure -Echocardiogram showed an EF of 60-65%, intermediate diastolic dysfunction -Lasix held due to worsening renal function -Cardiology consulted and appreciated -Monitor intake and output, daily weights -Urine output over the past 24 hours 1775 mL (with no diuretics) -as above, will obtain repeat CXR -Creatinine improving mildly, if continues to improve, will start on PO lasix  NSTEMI  -S/p CABG -Cardiology consulted and appreciated, continue medical treatment -Echocardiogram showed EF 60-65%, no wall motion abnormalities -Continue aspirin, statin -Was placed on IV heparin which was discontinued on 08/20/2017  Dyslipidemia -Continue statin  Diabetes mellitus, type II with nephropathy, complicated by hyperglycemia -Continue Lantus, insulin sliding scale, CBG monitoring -Hyperglycemia likely secondary to steroid use  Acute kidney injury on chronic kidney disease, stage III -Baseline creatinine from 1.3-1.4 -Advised admission, patient was given fluid due to sepsis however now chest x-ray did show volume overload -IV fluids were discontinued, patient was given IV Lasix however this is held due to worsening creatinine -Renal ultrasound: No obstruction, bilateral cortical atrophy, increased cortical echotexture consistent with medical renal disease -discontinued vancomycin and zosyn on 10/3 -Creatinine currently 2.28 -Continue to monitor BMP  Dementia -Continue Namenda, Aricept  Goals of care -Palliative care consulted and appreciated -Recommended patient have palliative care to follow as an outpatient -Patient has partial code, only BiPAP  DVT Prophylaxis  heparin  Code Status: partial, BiPAP   Family  Communication: None at bedside  Disposition Plan: Admitted. SNF at discharge when stable- possibly within the next 24-48 hours pending improvement in creatinine   Consultants Cardiology Palliative care  Procedures  Echocardiogram Renal US V/Q scan  Antibiotics   Anti-infectives    Start     Dose/Rate Route Frequency Ordered Stop   08/21/17 1606  vancomycin (VANCOCIN) IVPB 1000 mg/200 mL premix  Status:  Discontinued     1,000 mg 200 mL/hr over 60 Minutes Intravenous Every 48 hours 08/20/17 1259 08/21/17 1209   08/19/17 1800  piperacillin-tazobactam (ZOSYN) IVPB 3.375 g  Status:  Discontinued     3.375 g 12.5 mL/hr over 240 Minutes Intravenous Every 8 hours 08/19/17 1315 08/21/17 1209   08/18/17 1600  vancomycin (VANCOCIN) IVPB 1000 mg/200 mL premix  Status:  Discontinued     1,000 mg 200 mL/hr over 60 Minutes Intravenous Every 24 hours 08/17/17 1631 08/20/17 1259   08/17/17 2345  piperacillin-tazobactam (ZOSYN) IVPB 3.375 g  Status:  Discontinued     3.375 g 12.5 mL/hr over 240 Minutes Intravenous Every 8 hours 08/17/17 1631 08/19/17 1315   08/17/17 1545  piperacillin-tazobactam (ZOSYN) IVPB 3.375 g     3.375 g 100 mL/hr over 30 Minutes Intravenous  Once 08/17/17 1530 08/17/17 1616   08/17/17 1545  vancomycin (VANCOCIN) IVPB 1000 mg/200 mL premix     1,000 mg 200 mL/hr over 60 Minutes Intravenous  Once 08/17/17 1530 08/17/17 1657      Subjective:   Travis Coleman seen and examined today.  No complaints today. Very sleepy. Denies chest pain, feels breathing is "fine."  Objective:   Vitals:   08/22/17 0840 08/22/17 0915 08/22/17 0938 08/22/17 1000  BP:  (!) 173/68 (!) 191/89 (!) 166/50  Pulse:    80  Resp:    (!) 21  Temp:      TempSrc:      SpO2: 99%     Weight:      Height:        Intake/Output Summary (Last 24 hours) at 08/22/17 1037 Last data filed at 08/22/17 0700  Gross per 24 hour  Intake              440 ml  Output             2100 ml  Net             -1660 ml   Filed Weights   08/17/17 2004 08/17/17 2339  Weight: 95.7 kg (210 lb 15.7 oz) 95.7 kg (210 lb 15.7 oz)   Exam  General: Well developed, ill appearing, NAD  HEENT: NCAT,mucous membranes moist.   Cardiovascular: S1 S2 auscultated, 2/6 SEM, RRR  Respiratory: Coarse breath sounds  Abdomen: Soft, nontender, nondistended, + bowel sounds  Extremities: warm dry without cyanosis clubbing or edema  Neuro: AAOx2 (self, place), nonfocal  Psych: Appropriate  Data Reviewed: I have personally reviewed following labs and imaging studies  CBC:  Recent Labs Lab 08/18/17 0217 08/19/17 0328 08/20/17 0212 08/21/17 0232 08/22/17 0237  WBC 3.8* 6.6 9.8 9.9 10.3  HGB 9.0* 9.0* 8.6* 8.5* 8.5*  HCT 28.2* 27.5* 27.0* 25.9* 26.6*  MCV 97.9 96.5 95.4 95.6 95.0  PLT 104* 129* 159 153 174   Basic Metabolic Panel:  Recent Labs Lab 08/18/17 0217 08/19/17 0328 08/20/17 0212 08/21/17 0232 08/22/17 0237  NA 136 135 138 140 142  K 5.3* 4.5 4.1 4.3 4.2  CL 108 107 109 108 111  CO2 19* 18* 18* 18* 20*  GLUCOSE 373* 365* 395* 342* 371*  BUN 51* 64* 83* 91* 79*  CREATININE 2.35* 2.44* 2.90* 2.80* 2.28*  CALCIUM 7.1* 7.2* 7.3* 7.1* 7.3*  GFR: Estimated Creatinine Clearance: 28.5 mL/min (A) (by C-G formula based on SCr of 2.28 mg/dL (H)). Liver Function Tests: No results for input(s): AST, ALT, ALKPHOS, BILITOT, PROT, ALBUMIN in the last 168 hours. No results for input(s): LIPASE, AMYLASE in the last 168 hours. No results for input(s): AMMONIA in the last 168 hours. Coagulation Profile:  Recent Labs Lab 08/17/17 1539  INR 1.16   Cardiac Enzymes:  Recent Labs Lab 08/17/17 1539 08/17/17 2015 08/18/17 0217 08/18/17 0936  TROPONINI 3.10* 4.24* 3.18* 1.68*   BNP (last 3 results) No results for input(s): PROBNP in the last 8760 hours. HbA1C: No results for input(s): HGBA1C in the last 72 hours. CBG:  Recent Labs Lab 08/21/17 0734 08/21/17 1238 08/21/17 1740  08/21/17 2137 08/22/17 0812  GLUCAP 332* 337* 324* 369* 303*   Lipid Profile: No results for input(s): CHOL, HDL, LDLCALC, TRIG, CHOLHDL, LDLDIRECT in the last 72 hours. Thyroid Function Tests: No results for input(s): TSH, T4TOTAL, FREET4, T3FREE, THYROIDAB in the last 72 hours. Anemia Panel: No results for input(s): VITAMINB12, FOLATE, FERRITIN, TIBC, IRON, RETICCTPCT in the last 72 hours. Urine analysis:    Component Value Date/Time   COLORURINE YELLOW 08/17/2017 1554   APPEARANCEUR CLOUDY (A) 08/17/2017 1554   LABSPEC 1.017 08/17/2017 1554   PHURINE 5.0 08/17/2017 1554   GLUCOSEU NEGATIVE 08/17/2017 1554   HGBUR NEGATIVE 08/17/2017 1554   BILIRUBINUR NEGATIVE 08/17/2017 1554   KETONESUR NEGATIVE 08/17/2017 1554   PROTEINUR >=300 (A) 08/17/2017 1554   UROBILINOGEN 0.2 06/28/2014 2020   NITRITE NEGATIVE 08/17/2017 1554   LEUKOCYTESUR SMALL (A) 08/17/2017 1554   Sepsis Labs: (procalcitonin:4,lacticidven:4)  ) Recent Results (from the past 240 hour(s))  Blood Culture (routine x 2)     Status: Abnormal   Collection Time: 08/17/17  2:45 PM  Result Value Ref Range Status   Specimen Description BLOOD RIGHT HAND  Final   Special Requests   Final    BOTTLES DRAWN AEROBIC AND ANAEROBIC Blood Culture adequate volume   Culture  Setup Time   Final    GRAM POSITIVE COCCI IN CLUSTERS IN BOTH AEROBIC AND ANAEROBIC BOTTLES CRITICAL RESULT CALLED TO, READ BACK BY AND VERIFIED WITH: M MACCIA,PHARMD AT 1113 08/18/17 BY L BENFIELD    Culture (A)  Final    STAPHYLOCOCCUS SPECIES (COAGULASE NEGATIVE) THE SIGNIFICANCE OF ISOLATING THIS ORGANISM FROM A SINGLE SET OF BLOOD CULTURES WHEN MULTIPLE SETS ARE DRAWN IS UNCERTAIN. PLEASE NOTIFY THE MICROBIOLOGY DEPARTMENT WITHIN ONE WEEK IF SPECIATION AND SENSITIVITIES ARE REQUIRED.    Report Status 08/20/2017 FINAL  Final  Blood Culture ID Panel (Reflexed)     Status: Abnormal   Collection Time: 08/17/17  2:45 PM  Result Value Ref Range  Status   Enterococcus species NOT DETECTED NOT DETECTED Final   Listeria monocytogenes NOT DETECTED NOT DETECTED Final   Staphylococcus species DETECTED (A) NOT DETECTED Final    Comment: Methicillin (oxacillin) resistant coagulase negative staphylococcus. Possible blood culture contaminant (unless isolated from more than one blood culture draw or clinical case suggests pathogenicity). No antibiotic treatment is indicated for blood  culture contaminants. CRITICAL RESULT CALLED TO, READ BACK BY AND VERIFIED WITH: M MACCIA,PHARMD AT 1113 08/18/17 BY L BENFIELD    Staphylococcus aureus NOT DETECTED NOT DETECTED Final   Methicillin resistance DETECTED (A) NOT DETECTED Final    Comment: CRITICAL RESULT CALLED TO, READ BACK BY AND VERIFIED WITH: M MACCIA,PHARMD AT 1113 08/18/17 BY L BENFIELD    Streptococcus species NOT DETECTED NOT  DETECTED Final   Streptococcus agalactiae NOT DETECTED NOT DETECTED Final   Streptococcus pneumoniae NOT DETECTED NOT DETECTED Final   Streptococcus pyogenes NOT DETECTED NOT DETECTED Final   Acinetobacter baumannii NOT DETECTED NOT DETECTED Final   Enterobacteriaceae species NOT DETECTED NOT DETECTED Final   Enterobacter cloacae complex NOT DETECTED NOT DETECTED Final   Escherichia coli NOT DETECTED NOT DETECTED Final   Klebsiella oxytoca NOT DETECTED NOT DETECTED Final   Klebsiella pneumoniae NOT DETECTED NOT DETECTED Final   Proteus species NOT DETECTED NOT DETECTED Final   Serratia marcescens NOT DETECTED NOT DETECTED Final   Haemophilus influenzae NOT DETECTED NOT DETECTED Final   Neisseria meningitidis NOT DETECTED NOT DETECTED Final   Pseudomonas aeruginosa NOT DETECTED NOT DETECTED Final   Candida albicans NOT DETECTED NOT DETECTED Final   Candida glabrata NOT DETECTED NOT DETECTED Final   Candida krusei NOT DETECTED NOT DETECTED Final   Candida parapsilosis NOT DETECTED NOT DETECTED Final   Candida tropicalis NOT DETECTED NOT DETECTED Final  Blood  Culture (routine x 2)     Status: None (Preliminary result)   Collection Time: 08/17/17  3:39 PM  Result Value Ref Range Status   Specimen Description BLOOD RIGHT HAND  Final   Special Requests   Final    BOTTLES DRAWN AEROBIC AND ANAEROBIC Blood Culture adequate volume   Culture NO GROWTH 4 DAYS  Final   Report Status PENDING  Incomplete  Respiratory Panel by PCR     Status: Abnormal   Collection Time: 08/17/17  3:52 PM  Result Value Ref Range Status   Adenovirus NOT DETECTED NOT DETECTED Final   Coronavirus 229E NOT DETECTED NOT DETECTED Final   Coronavirus HKU1 NOT DETECTED NOT DETECTED Final   Coronavirus NL63 NOT DETECTED NOT DETECTED Final   Coronavirus OC43 NOT DETECTED NOT DETECTED Final   Metapneumovirus NOT DETECTED NOT DETECTED Final   Rhinovirus / Enterovirus DETECTED (A) NOT DETECTED Final   Influenza A NOT DETECTED NOT DETECTED Final   Influenza B NOT DETECTED NOT DETECTED Final   Parainfluenza Virus 1 NOT DETECTED NOT DETECTED Final   Parainfluenza Virus 2 NOT DETECTED NOT DETECTED Final   Parainfluenza Virus 3 NOT DETECTED NOT DETECTED Final   Parainfluenza Virus 4 NOT DETECTED NOT DETECTED Final   Respiratory Syncytial Virus NOT DETECTED NOT DETECTED Final   Bordetella pertussis NOT DETECTED NOT DETECTED Final   Chlamydophila pneumoniae NOT DETECTED NOT DETECTED Final   Mycoplasma pneumoniae NOT DETECTED NOT DETECTED Final  Urine culture     Status: None   Collection Time: 08/17/17  3:54 PM  Result Value Ref Range Status   Specimen Description URINE, CATHETERIZED  Final   Special Requests NONE  Final   Culture NO GROWTH  Final   Report Status 08/18/2017 FINAL  Final  MRSA PCR Screening     Status: None   Collection Time: 08/17/17  8:05 PM  Result Value Ref Range Status   MRSA by PCR NEGATIVE NEGATIVE Final    Comment:        The GeneXpert MRSA Assay (FDA approved for NASAL specimens only), is one component of a comprehensive MRSA colonization surveillance  program. It is not intended to diagnose MRSA infection nor to guide or monitor treatment for MRSA infections.   Urine culture     Status: None   Collection Time: 08/18/17 10:17 AM  Result Value Ref Range Status   Specimen Description URINE, CATHETERIZED  Final   Special Requests NONE  Final   Culture NO GROWTH  Final   Report Status 08/19/2017 FINAL  Final      Radiology Studies: No results found.   Scheduled Meds: . amLODipine  2.5 mg Oral Daily  . arformoterol  15 mcg Nebulization BID  . aspirin EC  81 mg Oral Daily  . atorvastatin  20 mg Oral QHS  . budesonide (PULMICORT) nebulizer solution  0.25 mg Nebulization BID  . cholecalciferol  2,000 Units Oral Daily  . divalproex  250 mg Oral TID  . memantine  28 mg Oral QHS   And  . donepezil  10 mg Oral QHS  . ferrous sulfate  325 mg Oral Q breakfast  . gabapentin  200 mg Oral QHS  . heparin  5,000 Units Subcutaneous Q8H  . hydrALAZINE  50 mg Oral Q8H  . insulin aspart  0-15 Units Subcutaneous TID WC  . insulin aspart  0-5 Units Subcutaneous QHS  . insulin aspart  7 Units Subcutaneous TID WC  . insulin glargine  18 Units Subcutaneous QHS  . ipratropium  0.5 mg Nebulization TID   And  . levalbuterol  0.63 mg Nebulization TID  . mouth rinse  15 mL Mouth Rinse BID  . predniSONE  40 mg Oral Q breakfast  . saccharomyces boulardii  250 mg Oral BID  . sulfacetamide  1 drop Both Eyes QID   Continuous Infusions:    LOS: 5 days   Time Spent in minutes   30 minutes  Jaxan Michel D.O. on 08/22/2017 at 10:37 AM  Between 7am to 7pm - Pager - [phone]  After 7pm go to [email or web address] - password [email or web address]  And look for the night coverage person covering for me after hours  [facility] Office  [phone]

## 2017-08-22 NOTE — Progress Notes (Signed)
Inpatient Diabetes Program Recommendations  AACE/ADA: New Consensus Statement on Inpatient Glycemic Control (2015)  Target Ranges:  Prepandial:   less than 140 mg/dL      Peak postprandial:   less than 180 mg/dL (1-2 hours)      Critically ill patients:  140 - 180 mg/dL   Lab Results  Component Value Date   GLUCAP 283 (H) 08/22/2017   HGBA1C 7.1 (H) 08/18/2017    Review of Glycemic Control Results for Travis Coleman, Travis Coleman (MRN [id number]) as of 08/22/2017 12:57  Ref. Range 08/21/2017 12:38 08/21/2017 17:40 08/21/2017 21:37 08/22/2017 08:12 08/22/2017 11:37  Glucose-Capillary Latest Ref Range: 65 - 99 mg/dL 401 (H) 027 (H) 253 (H) 303 (H) 283 (H)    Inpatient Diabetes Program Recommendations:  Please consider: -Increase Novolog meal coverage to 10 units tid if eats 50% And adjust as steroids further adjusted.  Thank you, Travis Coleman. Travis Uncapher, RN, MSN, CDE  Diabetes Coordinator Inpatient Glycemic Control Team Team Pager [phone] (8am-5pm) 08/22/2017 1:03 PM

## 2017-08-23 ENCOUNTER — Inpatient Hospital Stay (HOSPITAL_COMMUNITY): Payer: Medicare Other

## 2017-08-23 DIAGNOSIS — D72829 Elevated white blood cell count, unspecified: Secondary | ICD-10-CM

## 2017-08-23 LAB — GLUCOSE, CAPILLARY
GLUCOSE-CAPILLARY: 378 mg/dL — AB (ref 65–99)
Glucose-Capillary: 181 mg/dL — ABNORMAL HIGH (ref 65–99)
Glucose-Capillary: 288 mg/dL — ABNORMAL HIGH (ref 65–99)
Glucose-Capillary: 334 mg/dL — ABNORMAL HIGH (ref 65–99)

## 2017-08-23 LAB — BASIC METABOLIC PANEL
ANION GAP: 10 (ref 5–15)
BUN: 61 mg/dL — AB (ref 6–20)
CHLORIDE: 113 mmol/L — AB (ref 101–111)
CO2: 23 mmol/L (ref 22–32)
Calcium: 7.7 mg/dL — ABNORMAL LOW (ref 8.9–10.3)
Creatinine, Ser: 1.72 mg/dL — ABNORMAL HIGH (ref 0.61–1.24)
GFR calc Af Amer: 41 mL/min — ABNORMAL LOW (ref >60)
GFR calc non Af Amer: 35 mL/min — ABNORMAL LOW (ref >60)
GLUCOSE: 214 mg/dL — AB (ref 65–99)
POTASSIUM: 3.8 mmol/L (ref 3.5–5.1)
Sodium: 146 mmol/L — ABNORMAL HIGH (ref 135–145)

## 2017-08-23 LAB — CBC
HEMATOCRIT: 28.1 % — AB (ref 39.0–52.0)
HEMOGLOBIN: 8.9 g/dL — AB (ref 13.0–17.0)
MCH: 30.4 pg (ref 26.0–34.0)
MCHC: 31.7 g/dL (ref 30.0–36.0)
MCV: 95.9 fL (ref 78.0–100.0)
Platelets: 194 10*3/uL (ref 150–400)
RBC: 2.93 MIL/uL — AB (ref 4.22–5.81)
RDW: 14.1 % (ref 11.5–15.5)
WBC: 13 10*3/uL — AB (ref 4.0–10.5)

## 2017-08-23 LAB — URINALYSIS, ROUTINE W REFLEX MICROSCOPIC
BILIRUBIN URINE: NEGATIVE
Glucose, UA: 50 mg/dL — AB
KETONES UR: NEGATIVE mg/dL
Nitrite: NEGATIVE
PH: 5 (ref 5.0–8.0)
PROTEIN: 100 mg/dL — AB
Specific Gravity, Urine: 1.015 (ref 1.005–1.030)

## 2017-08-23 MED ORDER — DEXTROSE 5 % IV SOLN
1.0000 g | INTRAVENOUS | Status: DC
  Administered 2017-08-23 – 2017-08-26 (×4): 1 g via INTRAVENOUS
  Filled 2017-08-23 (×5): qty 1

## 2017-08-23 MED ORDER — HYDRALAZINE HCL 50 MG PO TABS
75.0000 mg | ORAL_TABLET | Freq: Three times a day (TID) | ORAL | Status: DC
  Administered 2017-08-23 – 2017-08-27 (×12): 75 mg via ORAL
  Filled 2017-08-23 (×12): qty 2

## 2017-08-23 MED ORDER — FUROSEMIDE 80 MG PO TABS
80.0000 mg | ORAL_TABLET | Freq: Every day | ORAL | Status: DC
  Administered 2017-08-23 – 2017-08-27 (×5): 80 mg via ORAL
  Filled 2017-08-23 (×5): qty 1

## 2017-08-23 NOTE — Clinical Social Work Note (Addendum)
Bed offers provided to patient's sister over the phone. She did not like these options and wants CSW to check on [facility]. That referral is still pending. CSW left message for admissions coordinator to review for long-term care. [name] asked why [facility] called her yesterday about her concerns and how they knew about that. CSW explained that they have access to her chart and called CSW asking about it. [name] expressed understanding.  Charlynn Court, CSW [phone]  3:44 pm Spoke with admissions coordinator at [facility] this morning. She has to review case with administration before considering him for long-term care. Have not heard back yet.  Charlynn Court, CSW [phone]

## 2017-08-23 NOTE — Progress Notes (Signed)
Received verbal order from Dr Catha Gosselin that is OK to change foley catheter and obtain a clean urine specimen for analysis

## 2017-08-23 NOTE — Progress Notes (Signed)
PROGRESS NOTE    Travis Coleman  QMV:[id number] DOB: [birth year] DOA: 08/17/2017 PCP: Travis Jenny, MD   Chief Complaint  Patient presents with  . Shortness of Breath    Brief Narrative:  HPI On 08/17/2017 by Dr. Thad Coleman Patient is a 83 year old male with history of CAD status post CABG, CHF, chronic kidney disease stage III, advanced dementia, hypertension, hyperlipidemia, PVD presented from [facility] skilled nursing facility for shortness of breath. History was obtained from the ER records and patient's sister at the bedside. Per patient's sister, patient had shortness of breath in the last 2-3 days at the facility and patient was placed on nebulizer treatments, Levaquin. He also received Lasix yesterday. She did not report any fevers. Today, EMS was called for acute respiratory distress and hypoxia, was placed on BiPAP in ED. Patient was noted to have diffuse rhonchi and wheezing. At baseline, patient is wheelchair bound, does not ambulate. [name] mentioned that patient was coughing and appeared to be choking on food yesterday. At the time of my encounter, patient is on BiPAP, has advanced dementia and unable to provide any history.  Interim history Admitted for respiratory failure with viral infection and CHF exacerbation. Patient had AKI and lasix held. Continues to be volume overloaded. Once creatinine improves, will restart lasix. Antibiotics discontinued.   Assessment & Plan   Acute on chronic hypoxic respiratory failure -Likely secondary to acute bronchitis/viral source -Presented with acute history distress and wheezing -Recquired BiPAP initially -continue pulmicort, Brovana -VQ Scan negative -Currently on room air with oxygen saturations in high 90s  Sepsis secondary to Rhinovirus/Enterovirus/ possible pneumonia -Present with fever, tachycardia, tachypnea -Respiratory viral panel positive for rhinovirus/enterovirus -influenza PCR negative -Urine strep pneumonia  and legionella antigens negative -Blood cultures 1/2 coag-negative staph, likely contaminant -was placed vanc and zosyn, discontinued on 08/21/2017. Will continue to monitor closely -Increase in leukocytosis today, Repeated CXR today: improved B/L lower lobe density consistent with improving pneumonia, mild persistent density L > R -Will exchange foley catheter today and repeat UA -will start patient on cefepime  Acute diastolic heart failure -Echocardiogram showed an EF of 60-65%, intermediate diastolic dysfunction -Lasix held due to worsening renal function -Cardiology consulted and appreciated -Monitor intake and output, daily weights -Urine output over the past 24 hours 1850 mL (with no diuretics) -Creatinine improving mildly, if continues to improve, will start on PO lasix -will restart PO lasix today, discussed with Dr. Algie Coleman, will start  daily -Continue to monitor BMP closely  NSTEMI  -S/p CABG -Cardiology consulted and appreciated, continue medical treatment -Echocardiogram showed EF 60-65%, no wall motion abnormalities -Continue aspirin, statin -Was placed on IV heparin which was discontinued on 08/20/2017  Dyslipidemia -Continue statin  Diabetes mellitus, type II with nephropathy, complicated by hyperglycemia -Continue Lantus, insulin sliding scale, CBG monitoring -Hyperglycemia likely secondary to steroid use  Acute kidney injury on chronic kidney disease, stage III -Baseline creatinine from 1.3-1.4 -Advised admission, patient was given fluid due to sepsis however now chest x-ray did show volume overload -IV fluids were discontinued, patient was given IV Lasix however this is held due to worsening creatinine -Renal ultrasound: No obstruction, bilateral cortical atrophy, increased cortical echotexture consistent with medical renal disease -discontinued vancomycin and zosyn on 10/3 -Creatinine currently 1.72 -Continue to monitor BMP  Dementia -Continue Namenda,  Aricept  Goals of care -Palliative care consulted and appreciated -Recommended patient have palliative care to follow as an outpatient -Patient has partial code, only BiPAP  DVT Prophylaxis  heparin  Code Status: partial, BiPAP   Family Communication: None at bedside  Disposition Plan: Admitted. SNF at discharge when stable- possibly within the next 24-48 hours pending improvement in creatinine   Consultants Cardiology Palliative care  Procedures  Echocardiogram Renal US V/Q scan  Antibiotics   Anti-infectives    Start     Dose/Rate Route Frequency Ordered Stop   08/21/17 1606  vancomycin (VANCOCIN) IVPB 1000 mg/200 mL premix  Status:  Discontinued     1,000 mg 200 mL/hr over 60 Minutes Intravenous Every 48 hours 08/20/17 1259 08/21/17 1209   08/19/17 1800  piperacillin-tazobactam (ZOSYN) IVPB 3.375 g  Status:  Discontinued     3.375 g 12.5 mL/hr over 240 Minutes Intravenous Every 8 hours 08/19/17 1315 08/21/17 1209   08/18/17 1600  vancomycin (VANCOCIN) IVPB 1000 mg/200 mL premix  Status:  Discontinued     1,000 mg 200 mL/hr over 60 Minutes Intravenous Every 24 hours 08/17/17 1631 08/20/17 1259   08/17/17 2345  piperacillin-tazobactam (ZOSYN) IVPB 3.375 g  Status:  Discontinued     3.375 g 12.5 mL/hr over 240 Minutes Intravenous Every 8 hours 08/17/17 1631 08/19/17 1315   08/17/17 1545  piperacillin-tazobactam (ZOSYN) IVPB 3.375 g     3.375 g 100 mL/hr over 30 Minutes Intravenous  Once 08/17/17 1530 08/17/17 1616   08/17/17 1545  vancomycin (VANCOCIN) IVPB 1000 mg/200 mL premix     1,000 mg 200 mL/hr over 60 Minutes Intravenous  Once 08/17/17 1530 08/17/17 1657      Subjective:   Travis Coleman seen and examined today.  States "what the hell do you want?" Has no complaints.   Objective:   Vitals:   08/23/17 0300 08/23/17 0316 08/23/17 0733 08/23/17 0808  BP:  (!) 174/58 (!) 205/65   Pulse: 72 72 75   Resp: (!) 21 17 (!) 23   Temp:  98.3 F (36.8 C)      TempSrc:  Oral    SpO2:  97% 100% 100%  Weight:      Height:        Intake/Output Summary (Last 24 hours) at 08/23/17 0956 Last data filed at 08/23/17 0900  Gross per 24 hour  Intake              180 ml  Output             1700 ml  Net            -1520 ml   Filed Weights   08/17/17 2004 08/17/17 2339  Weight: 95.7 kg (210 lb 15.7 oz) 95.7 kg (210 lb 15.7 oz)   Exam  General: Well developed, chronically ill appearing, NAD  HEENT: NCAT,mucous membranes moist.   Cardiovascular: S1 S2 auscultated, 2/6 SEM, RRR  Respiratory: Coarse breath sounds, more upper airway  Abdomen: Soft, nontender, nondistended, + bowel sounds  Extremities: warm dry without cyanosis clubbing or edema  Neuro: AAOx1, nonfocal  Psych: Annoyed   Data Reviewed: I have personally reviewed following labs and imaging studies  CBC:  Recent Labs Lab 08/19/17 0328 08/20/17 0212 08/21/17 0232 08/22/17 0237 08/23/17 0306  WBC 6.6 9.8 9.9 10.3 13.0*  HGB 9.0* 8.6* 8.5* 8.5* 8.9*  HCT 27.5* 27.0* 25.9* 26.6* 28.1*  MCV 96.5 95.4 95.6 95.0 95.9  PLT 129* 159 153 174 194   Basic Metabolic Panel:  Recent Labs Lab 08/19/17 0328 08/20/17 0212 08/21/17 0232 08/22/17 0237 08/22/17 1043 08/23/17 0306  NA 135 138 140 142  --  146*  K 4.5 4.1 4.3 4.2  --  3.8  CL 107 109 108 111  --  113*  CO2 18* 18* 18* 20*  --  23  GLUCOSE 365* 395* 342* 371*  --  214*  BUN 64* 83* 91* 79*  --  61*  CREATININE 2.44* 2.90* 2.80* 2.28*  --  1.72*  CALCIUM 7.2* 7.3* 7.1* 7.3*  --  7.7*  MG  --   --   --   --  2.1  --    GFR: Estimated Creatinine Clearance: 37.8 mL/min (A) (by C-G formula based on SCr of 1.72 mg/dL (H)). Liver Function Tests: No results for input(s): AST, ALT, ALKPHOS, BILITOT, PROT, ALBUMIN in the last 168 hours. No results for input(s): LIPASE, AMYLASE in the last 168 hours. No results for input(s): AMMONIA in the last 168 hours. Coagulation Profile:  Recent Labs Lab 08/17/17 1539  INR  1.16   Cardiac Enzymes:  Recent Labs Lab 08/17/17 1539 08/17/17 2015 08/18/17 0217 08/18/17 0936  TROPONINI 3.10* 4.24* 3.18* 1.68*   BNP (last 3 results) No results for input(s): PROBNP in the last 8760 hours. HbA1C: No results for input(s): HGBA1C in the last 72 hours. CBG:  Recent Labs Lab 08/22/17 0812 08/22/17 1137 08/22/17 1659 08/22/17 2121 08/23/17 0808  GLUCAP 303* 283* 285* 281* 181*   Lipid Profile: No results for input(s): CHOL, HDL, LDLCALC, TRIG, CHOLHDL, LDLDIRECT in the last 72 hours. Thyroid Function Tests: No results for input(s): TSH, T4TOTAL, FREET4, T3FREE, THYROIDAB in the last 72 hours. Anemia Panel: No results for input(s): VITAMINB12, FOLATE, FERRITIN, TIBC, IRON, RETICCTPCT in the last 72 hours. Urine analysis:    Component Value Date/Time   COLORURINE YELLOW 08/23/2017 0928   APPEARANCEUR HAZY (A) 08/23/2017 0928   LABSPEC 1.015 08/23/2017 0928   PHURINE 5.0 08/23/2017 0928   GLUCOSEU 50 (A) 08/23/2017 0928   HGBUR SMALL (A) 08/23/2017 0928   BILIRUBINUR NEGATIVE 08/23/2017 0928   KETONESUR NEGATIVE 08/23/2017 0928   PROTEINUR 100 (A) 08/23/2017 0928   UROBILINOGEN 0.2 06/28/2014 2020   NITRITE NEGATIVE 08/23/2017 0928   LEUKOCYTESUR SMALL (A) 08/23/2017 0928   Sepsis Labs: (procalcitonin:4,lacticidven:4)  ) Recent Results (from the past 240 hour(s))  Blood Culture (routine x 2)     Status: Abnormal   Collection Time: 08/17/17  2:45 PM  Result Value Ref Range Status   Specimen Description BLOOD RIGHT HAND  Final   Special Requests   Final    BOTTLES DRAWN AEROBIC AND ANAEROBIC Blood Culture adequate volume   Culture  Setup Time   Final    GRAM POSITIVE COCCI IN CLUSTERS IN BOTH AEROBIC AND ANAEROBIC BOTTLES CRITICAL RESULT CALLED TO, READ BACK BY AND VERIFIED WITH: M MACCIA,PHARMD AT 1113 08/18/17 BY L BENFIELD    Culture (A)  Final    STAPHYLOCOCCUS SPECIES (COAGULASE NEGATIVE) THE SIGNIFICANCE OF ISOLATING THIS  ORGANISM FROM A SINGLE SET OF BLOOD CULTURES WHEN MULTIPLE SETS ARE DRAWN IS UNCERTAIN. PLEASE NOTIFY THE MICROBIOLOGY DEPARTMENT WITHIN ONE WEEK IF SPECIATION AND SENSITIVITIES ARE REQUIRED.    Report Status 08/20/2017 FINAL  Final  Blood Culture ID Panel (Reflexed)     Status: Abnormal   Collection Time: 08/17/17  2:45 PM  Result Value Ref Range Status   Enterococcus species NOT DETECTED NOT DETECTED Final   Listeria monocytogenes NOT DETECTED NOT DETECTED Final   Staphylococcus species DETECTED (A) NOT DETECTED Final    Comment: Methicillin (oxacillin) resistant coagulase negative staphylococcus. Possible blood culture contaminant (  unless isolated from more than one blood culture draw or clinical case suggests pathogenicity). No antibiotic treatment is indicated for blood  culture contaminants. CRITICAL RESULT CALLED TO, READ BACK BY AND VERIFIED WITH: M MACCIA,PHARMD AT 1113 08/18/17 BY L BENFIELD    Staphylococcus aureus NOT DETECTED NOT DETECTED Final   Methicillin resistance DETECTED (A) NOT DETECTED Final    Comment: CRITICAL RESULT CALLED TO, READ BACK BY AND VERIFIED WITH: M MACCIA,PHARMD AT 1113 08/18/17 BY L BENFIELD    Streptococcus species NOT DETECTED NOT DETECTED Final   Streptococcus agalactiae NOT DETECTED NOT DETECTED Final   Streptococcus pneumoniae NOT DETECTED NOT DETECTED Final   Streptococcus pyogenes NOT DETECTED NOT DETECTED Final   Acinetobacter baumannii NOT DETECTED NOT DETECTED Final   Enterobacteriaceae species NOT DETECTED NOT DETECTED Final   Enterobacter cloacae complex NOT DETECTED NOT DETECTED Final   Escherichia coli NOT DETECTED NOT DETECTED Final   Klebsiella oxytoca NOT DETECTED NOT DETECTED Final   Klebsiella pneumoniae NOT DETECTED NOT DETECTED Final   Proteus species NOT DETECTED NOT DETECTED Final   Serratia marcescens NOT DETECTED NOT DETECTED Final   Haemophilus influenzae NOT DETECTED NOT DETECTED Final   Neisseria meningitidis NOT DETECTED  NOT DETECTED Final   Pseudomonas aeruginosa NOT DETECTED NOT DETECTED Final   Candida albicans NOT DETECTED NOT DETECTED Final   Candida glabrata NOT DETECTED NOT DETECTED Final   Candida krusei NOT DETECTED NOT DETECTED Final   Candida parapsilosis NOT DETECTED NOT DETECTED Final   Candida tropicalis NOT DETECTED NOT DETECTED Final  Blood Culture (routine x 2)     Status: None   Collection Time: 08/17/17  3:39 PM  Result Value Ref Range Status   Specimen Description BLOOD RIGHT HAND  Final   Special Requests   Final    BOTTLES DRAWN AEROBIC AND ANAEROBIC Blood Culture adequate volume   Culture NO GROWTH 5 DAYS  Final   Report Status 08/22/2017 FINAL  Final  Respiratory Panel by PCR     Status: Abnormal   Collection Time: 08/17/17  3:52 PM  Result Value Ref Range Status   Adenovirus NOT DETECTED NOT DETECTED Final   Coronavirus 229E NOT DETECTED NOT DETECTED Final   Coronavirus HKU1 NOT DETECTED NOT DETECTED Final   Coronavirus NL63 NOT DETECTED NOT DETECTED Final   Coronavirus OC43 NOT DETECTED NOT DETECTED Final   Metapneumovirus NOT DETECTED NOT DETECTED Final   Rhinovirus / Enterovirus DETECTED (A) NOT DETECTED Final   Influenza A NOT DETECTED NOT DETECTED Final   Influenza B NOT DETECTED NOT DETECTED Final   Parainfluenza Virus 1 NOT DETECTED NOT DETECTED Final   Parainfluenza Virus 2 NOT DETECTED NOT DETECTED Final   Parainfluenza Virus 3 NOT DETECTED NOT DETECTED Final   Parainfluenza Virus 4 NOT DETECTED NOT DETECTED Final   Respiratory Syncytial Virus NOT DETECTED NOT DETECTED Final   Bordetella pertussis NOT DETECTED NOT DETECTED Final   Chlamydophila pneumoniae NOT DETECTED NOT DETECTED Final   Mycoplasma pneumoniae NOT DETECTED NOT DETECTED Final  Urine culture     Status: None   Collection Time: 08/17/17  3:54 PM  Result Value Ref Range Status   Specimen Description URINE, CATHETERIZED  Final   Special Requests NONE  Final   Culture NO GROWTH  Final   Report  Status 08/18/2017 FINAL  Final  MRSA PCR Screening     Status: None   Collection Time: 08/17/17  8:05 PM  Result Value Ref Range Status   MRSA  by PCR NEGATIVE NEGATIVE Final    Comment:        The GeneXpert MRSA Assay (FDA approved for NASAL specimens only), is one component of a comprehensive MRSA colonization surveillance program. It is not intended to diagnose MRSA infection nor to guide or monitor treatment for MRSA infections.   Urine culture     Status: None   Collection Time: 08/18/17 10:17 AM  Result Value Ref Range Status   Specimen Description URINE, CATHETERIZED  Final   Special Requests NONE  Final   Culture NO GROWTH  Final   Report Status 08/19/2017 FINAL  Final      Radiology Studies: Dg Chest Port 1 View  Result Date: 08/23/2017 CLINICAL DATA:  Followup pneumonia EXAM: PORTABLE CHEST 1 VIEW COMPARISON:  08/18/2017 FINDINGS: Previous median sternotomy and CABG. Bilateral lower lobe pneumonia, left more than right, is improved with slightly less consolidation particularly in the left lower lobe. No worsening or new finding. No effusion. No acute bone finding. IMPRESSION: Improved bilateral lower lobe density consistent with improving pneumonia. Mild persistent density left more than right. Electronically Signed   By: Paulina Fusi M.D.   On: 08/23/2017 08:00     Scheduled Meds: . amLODipine  2.5 mg Oral Daily  . arformoterol  15 mcg Nebulization BID  . aspirin EC  81 mg Oral Daily  . atorvastatin  20 mg Oral QHS  . budesonide (PULMICORT) nebulizer solution  0.25 mg Nebulization BID  . cholecalciferol  2,000 Units Oral Daily  . divalproex  250 mg Oral TID  . memantine  28 mg Oral QHS   And  . donepezil  10 mg Oral QHS  . ferrous sulfate  325 mg Oral Q breakfast  . gabapentin  200 mg Oral QHS  . heparin  5,000 Units Subcutaneous Q8H  . hydrALAZINE  75 mg Oral Q8H  . insulin aspart  0-15 Units Subcutaneous TID WC  . insulin aspart  0-5 Units Subcutaneous QHS   . insulin aspart  7 Units Subcutaneous TID WC  . insulin glargine  18 Units Subcutaneous QHS  . ipratropium  0.5 mg Nebulization TID   And  . levalbuterol  0.63 mg Nebulization TID  . labetalol  5 mg Intravenous Once  . mouth rinse  15 mL Mouth Rinse BID  . predniSONE  40 mg Oral Q breakfast  . saccharomyces boulardii  250 mg Oral BID  . sulfacetamide  1 drop Both Eyes QID   Continuous Infusions:    LOS: 6 days   Time Spent in minutes   45 minutes  Alberta Lenhard D.O. on 08/23/2017 at 9:56 AM  Between 7am to 7pm - Pager - [phone]  After 7pm go to [email or web address] - password [email or web address]  And look for the night coverage person covering for me after hours  [facility] Office  [phone]

## 2017-08-23 NOTE — Progress Notes (Signed)
PT refuses to wear Bipap.  RT will continue to monitor. 

## 2017-08-23 NOTE — Progress Notes (Signed)
Pharmacy Antibiotic Note Travis Coleman is a 83 y.o. male admitted on 08/17/2017 with pneumonia.  Pharmacy has been consulted for Cefepime dosing.  Plan: Cefepime 1 gram IV every 24 hours   Height:  (177.8 cm) Weight: 210 lb 15.7 oz (95.7 kg) IBW/kg (Calculated) : 73  Temp (24hrs), Avg:97.9 F (36.6 C), Min:97.4 F (36.3 C), Max:98.5 F (36.9 C)   Recent Labs Lab 08/17/17 1550 08/17/17 1917 08/17/17 2015  08/19/17 0328 08/20/17 0212 08/21/17 0232 08/22/17 0237 08/23/17 0306  WBC  --   --   --   < > 6.6 9.8 9.9 10.3 13.0*  CREATININE  --   --   --   < > 2.44* 2.90* 2.80* 2.28* 1.72*  LATICACIDVEN 2.10* 1.6 1.8  --   --   --   --   --   --   < > = values in this interval not displayed.  Estimated Creatinine Clearance: 37.8 mL/min (A) (by C-G formula based on SCr of 1.72 mg/dL (H)).    No Known Allergies  Antimicrobials this admission:  Cefepime 10/5 >>  Zosyn 9/29>>10/3 Vancomycin 9/29>>10/1   Microbiology results: 9/29 RVP: positive rhinovirus  9/29 bld x2 - MR coag neg staph in BCID 9/29 UCx: ngF 9/29 MRSA PCR: neg 9/30 UCx: ngF 9/30 strep pna antigen: negative   9/30 legionella: negative   Thank you for allowing pharmacy to be a part of this patient's care.   Pollyann Samples, PharmD, BCPS 08/23/2017, 10:59 AM

## 2017-08-23 NOTE — Evaluation (Signed)
Physical Therapy Evaluation Patient Details Name: Travis Coleman MRN: [id number] DOB: [birth year] Today's Date: 08/23/2017   History of Present Illness  Patient is a 83 year old male with history of CAD status post CABG, CHF, chronic kidney disease stage III, advanced dementia, hypertension, hyperlipidemia, PVD presented from [facility] skilled nursing facility for shortness of breath. History was obtained from the ER records and patient's sister at the bedside. Per patient's sister, patient had shortness of breath in the last 2-3 days at the facility and patient was placed on nebulizer treatments, Levaquin. He also received Lasix yesterday. She did not report any fevers. Today, EMS was called for acute respiratory distress and hypoxia, was placed on BiPAP in ED.  Clinical Impression  Evaluation limited by pt's dementia and no family members available to communicate baseline level of function. Pt reports that he doesn't walk and that he uses a wheelchair but doesn't know how he gets there. Pt cooperative with some movement but started yelling when attempted to have pt sit EoB. Pt initiated movement of LE but became agitated with assistance from PT and started yelling to stop. Pt appears to be at baseline level of function and is not able to participate in physical therapy. PT is signing off on this patient.     Follow Up Recommendations SNF    Equipment Recommendations  None recommended by PT    Recommendations for Other Services       Precautions / Restrictions Precautions Precautions: None Restrictions Weight Bearing Restrictions: No      Mobility  Bed Mobility Overal bed mobility: Needs Assistance Bed Mobility: Supine to Sit           General bed mobility comments: attempted to get pt to sit EoB, pt gave verbal okay and showed initial understanding, pt given maximal verbal cuing to how LE would be moved, when attempted to move L LE to EoB pt began yelling to stop, attempted 2  times    Transfers                    Ambulation/Gait                Stairs            Wheelchair Mobility    Modified Rankin (Stroke Patients Only)       Balance                                             Pertinent Vitals/Pain Pain Assessment: Faces Faces Pain Scale: No hurt    Home Living Family/patient expects to be discharged to:: Skilled nursing facility                      Prior Function Level of Independence: Needs assistance   Gait / Transfers Assistance Needed: says he doesn't walk, and that he has a wheelchair but does not know how he gets into it, nodded when I asked about a blue sling to move so probable mechanical lift to chair     Comments: unable to communicate     Hand Dominance        Extremity/Trunk Assessment   Upper Extremity Assessment Upper Extremity Assessment: RUE deficits/detail;LUE deficits/detail;Difficult to assess due to impaired cognition RUE Deficits / Details: AROM shoulder flexion to 70 degrees, AAROM to 90 degrees, AROM elbow flexion/extension, wrist and fingers  WFL LUE Deficits / Details: AROM shoulder flexion to 60 degrees, AAROM to 90 degrees, AROM elbow flexion/extension, wrist and fingers WFL    Lower Extremity Assessment Lower Extremity Assessment: RLE deficits/detail;LLE deficits/detail;Difficult to assess due to impaired cognition RLE Deficits / Details: AAROM hip flexion, knee flexion/ext, pt did not like AAROM ankle lacking dorsiflexion LLE Deficits / Details: AAROM hip flexion, knee flexion/ext, pt did not like AAROM ankle lacking dorsiflexion       Communication   Communication: Receptive difficulties;Expressive difficulties;HOH  Cognition Arousal/Alertness: Awake/alert Behavior During Therapy: WFL for tasks assessed/performed Overall Cognitive Status: History of cognitive impairments - at baseline                                 General Comments:  no family available to determine baseline      General Comments      Exercises General Exercises - Lower Extremity Heel Slides: AAROM;Both;5 reps;Supine Straight Leg Raises: AAROM;5 reps;Both;Supine   Assessment/Plan    PT Assessment Patent does not need any further PT services  PT Problem List         PT Treatment Interventions      PT Goals (Current goals can be found in the Care Plan section)  Acute Rehab PT Goals Patient Stated Goal: none stated PT Goal Formulation: Patient unable to participate in goal setting    Frequency     Barriers to discharge        Co-evaluation               AM-PAC PT "6 Clicks" Daily Activity  Outcome Measure Difficulty turning over in bed (including adjusting bedclothes, sheets and blankets)?: Unable Difficulty moving from lying on back to sitting on the side of the bed? : Unable Difficulty sitting down on and standing up from a chair with arms (e.g., wheelchair, bedside commode, etc,.)?: Unable Help needed moving to and from a bed to chair (including a wheelchair)?: Total Help needed walking in hospital room?: Total Help needed climbing 3-5 steps with a railing? : Total 6 Click Score: 6    End of Session Equipment Utilized During Treatment: Oxygen Activity Tolerance: Treatment limited secondary to agitation Patient left: in bed;with call bell/phone within reach Nurse Communication: Mobility status PT Visit Diagnosis: Muscle weakness (generalized) (M62.81);Other abnormalities of gait and mobility (R26.89);Other symptoms and signs involving the nervous system (R29.898)    Time: 1356-1406 PT Time Calculation (min) (ACUTE ONLY): 10 min   Charges:   PT Evaluation $PT Eval Moderate Complexity: 1 Mod     PT G Codes:        Laurelai Lepp B. Beverely Risen PT, DPT Acute Rehabilitation  [phone] Pager [phone]    Elon Alas Fleet 08/23/2017, 2:31 PM

## 2017-08-24 LAB — CBC
HEMATOCRIT: 28.2 % — AB (ref 39.0–52.0)
HEMOGLOBIN: 9 g/dL — AB (ref 13.0–17.0)
MCH: 31 pg (ref 26.0–34.0)
MCHC: 31.9 g/dL (ref 30.0–36.0)
MCV: 97.2 fL (ref 78.0–100.0)
Platelets: 210 10*3/uL (ref 150–400)
RBC: 2.9 MIL/uL — ABNORMAL LOW (ref 4.22–5.81)
RDW: 14.4 % (ref 11.5–15.5)
WBC: 12.6 10*3/uL — ABNORMAL HIGH (ref 4.0–10.5)

## 2017-08-24 LAB — GLUCOSE, CAPILLARY
GLUCOSE-CAPILLARY: 300 mg/dL — AB (ref 65–99)
GLUCOSE-CAPILLARY: 365 mg/dL — AB (ref 65–99)
GLUCOSE-CAPILLARY: 354 mg/dL — AB (ref 65–99)
Glucose-Capillary: 277 mg/dL — ABNORMAL HIGH (ref 65–99)

## 2017-08-24 LAB — BASIC METABOLIC PANEL
Anion gap: 11 (ref 5–15)
BUN: 51 mg/dL — ABNORMAL HIGH (ref 6–20)
CALCIUM: 7.8 mg/dL — AB (ref 8.9–10.3)
CHLORIDE: 109 mmol/L (ref 101–111)
CO2: 22 mmol/L (ref 22–32)
CREATININE: 1.72 mg/dL — AB (ref 0.61–1.24)
GFR calc non Af Amer: 35 mL/min — ABNORMAL LOW (ref >60)
GFR, EST AFRICAN AMERICAN: 41 mL/min — AB (ref >60)
GLUCOSE: 333 mg/dL — AB (ref 65–99)
Potassium: 4.4 mmol/L (ref 3.5–5.1)
Sodium: 142 mmol/L (ref 135–145)

## 2017-08-24 MED ORDER — INSULIN GLARGINE 100 UNIT/ML ~~LOC~~ SOLN
20.0000 [IU] | Freq: Every day | SUBCUTANEOUS | Status: DC
  Administered 2017-08-24: 20 [IU] via SUBCUTANEOUS
  Filled 2017-08-24: qty 0.2

## 2017-08-24 MED ORDER — INSULIN ASPART 100 UNIT/ML ~~LOC~~ SOLN
0.0000 [IU] | Freq: Three times a day (TID) | SUBCUTANEOUS | Status: DC
Start: 2017-08-24 — End: 2017-08-27
  Administered 2017-08-24 (×2): 20 [IU] via SUBCUTANEOUS
  Administered 2017-08-25: 11 [IU] via SUBCUTANEOUS
  Administered 2017-08-25 (×2): 7 [IU] via SUBCUTANEOUS
  Administered 2017-08-26: 3 [IU] via SUBCUTANEOUS
  Administered 2017-08-26: 15 [IU] via SUBCUTANEOUS
  Administered 2017-08-26: 7 [IU] via SUBCUTANEOUS

## 2017-08-24 NOTE — Progress Notes (Signed)
PROGRESS NOTE    Travis Coleman  ZOX:[id number] DOB: February 15, 1932 DOA: 08/17/2017 PCP: Florentina Jenny, MD   Chief Complaint  Patient presents with  . Shortness of Breath    Brief Narrative:  HPI On 08/17/2017 by Dr. Thad Ranger Patient is a 83 year old male with history of CAD status post CABG, CHF, chronic kidney disease stage III, advanced dementia, hypertension, hyperlipidemia, PVD presented from [facility] skilled nursing facility for shortness of breath. History was obtained from the ER records and patient's sister at the bedside. Per patient's sister, patient had shortness of breath in the last 2-3 days at the facility and patient was placed on nebulizer treatments, Levaquin. He also received Lasix yesterday. She did not report any fevers. Today, EMS was called for acute respiratory distress and hypoxia, was placed on BiPAP in ED. Patient was noted to have diffuse rhonchi and wheezing. At baseline, patient is wheelchair bound, does not ambulate. [name] mentioned that patient was coughing and appeared to be choking on food yesterday. At the time of my encounter, patient is on BiPAP, has advanced dementia and unable to provide any history.  Interim history Admitted for respiratory failure with viral infection and CHF exacerbation. Patient had AKI and lasix held. Continues to be volume overloaded.Creatinine improved, started PO lasix. Patient with elevated WBC, restarted on cefepime, given that CXR noted improvement in pneumonia.  Assessment & Plan   Acute on chronic hypoxic respiratory failure -Likely secondary to acute bronchitis/viral source -Presented with acute history distress and wheezing -Recquired BiPAP initially -continue pulmicort, Brovana -VQ Scan negative -Currently on room air with oxygen saturations in high 90s -will wean prednisone  Sepsis secondary to Rhinovirus/Enterovirus/ possible pneumonia -Present with fever, tachycardia, tachypnea -Respiratory viral panel  positive for rhinovirus/enterovirus -influenza PCR negative -Urine strep pneumonia and legionella antigens negative -Blood cultures 1/2 coag-negative staph, likely contaminant -was placed vanc and zosyn, discontinued on 08/21/2017. Will continue to monitor closely -Increase in leukocytosis today, Repeated CXR today: improved B/L lower lobe density consistent with improving pneumonia, mild persistent density L > R -Repeat UA unremarkable for infection -Continue cefepime for possible pneumonia  Acute diastolic heart failure -Echocardiogram showed an EF of 60-65%, intermediate diastolic dysfunction -Lasix held due to worsening renal function -Cardiology consulted and appreciated -Monitor intake and output, daily weights -Continue PO lasix  daily (discussed with Dr. Algie Coffer) -Urine output over the past 24 hours 3050 mL  -Continue to monitor BMP closely  NSTEMI  -S/p CABG -Cardiology consulted and appreciated, continue medical treatment -Echocardiogram showed EF 60-65%, no wall motion abnormalities -Continue aspirin, statin -Was placed on IV heparin which was discontinued on 08/20/2017  Dyslipidemia -Continue statin  Diabetes mellitus, type II with nephropathy, complicated by hyperglycemia -Continue Lantus, insulin sliding scale, CBG monitoring -Hyperglycemia likely secondary to steroid use -increased lantus to 20units today, and transitioned to resistant sliding scale  Acute kidney injury on chronic kidney disease, stage III -Baseline creatinine from 1.3-1.4 -Advised admission, patient was given fluid due to sepsis however now chest x-ray did show volume overload -IV fluids were discontinued, patient was given IV Lasix however this is held due to worsening creatinine -Renal ultrasound: No obstruction, bilateral cortical atrophy, increased cortical echotexture consistent with medical renal disease -discontinued vancomycin and zosyn on 10/3 -Creatinine currently 1.72  (stable) -Continue to monitor BMP  Dementia -Continue Namenda, Aricept  Goals of care -Palliative care consulted and appreciated -Recommended patient have palliative care to follow as an outpatient -Patient has partial code, only BiPAP  DVT Prophylaxis  heparin  Code Status: partial, BiPAP   Family Communication: None at bedside  Disposition Plan: Admitted. SNF at discharge when stable- possibly within the next 24-48 hours   Consultants Cardiology Palliative care  Procedures  Echocardiogram Renal US V/Q scan  Antibiotics   Anti-infectives    Start     Dose/Rate Route Frequency Ordered Stop   08/23/17 1100  ceFEPIme (MAXIPIME) 1 g in dextrose 5 % 50 mL IVPB     1 g 100 mL/hr over 30 Minutes Intravenous Every 24 hours 08/23/17 1057     08/21/17 1606  vancomycin (VANCOCIN) IVPB 1000 mg/200 mL premix  Status:  Discontinued     1,000 mg 200 mL/hr over 60 Minutes Intravenous Every 48 hours 08/20/17 1259 08/21/17 1209   08/19/17 1800  piperacillin-tazobactam (ZOSYN) IVPB 3.375 g  Status:  Discontinued     3.375 g 12.5 mL/hr over 240 Minutes Intravenous Every 8 hours 08/19/17 1315 08/21/17 1209   08/18/17 1600  vancomycin (VANCOCIN) IVPB 1000 mg/200 mL premix  Status:  Discontinued     1,000 mg 200 mL/hr over 60 Minutes Intravenous Every 24 hours 08/17/17 1631 08/20/17 1259   08/17/17 2345  piperacillin-tazobactam (ZOSYN) IVPB 3.375 g  Status:  Discontinued     3.375 g 12.5 mL/hr over 240 Minutes Intravenous Every 8 hours 08/17/17 1631 08/19/17 1315   08/17/17 1545  piperacillin-tazobactam (ZOSYN) IVPB 3.375 g     3.375 g 100 mL/hr over 30 Minutes Intravenous  Once 08/17/17 1530 08/17/17 1616   08/17/17 1545  vancomycin (VANCOCIN) IVPB 1000 mg/200 mL premix     1,000 mg 200 mL/hr over 60 Minutes Intravenous  Once 08/17/17 1530 08/17/17 1657      Subjective:   [name] seen and examined today.  No complaints today.   Objective:   Vitals:   08/24/17 0346  08/24/17 0605 08/24/17 0718 08/24/17 0800  BP: (!) 174/61   (!) 163/53  Pulse: 80   71  Resp: (!) 29   17  Temp: 98 F (36.7 C)   98.5 F (36.9 C)  TempSrc: Oral   Oral  SpO2: 100%  100%   Weight:  93.5 kg (206 lb 2.1 oz)    Height:        Intake/Output Summary (Last 24 hours) at 08/24/17 1126 Last data filed at 08/24/17 1016  Gross per 24 hour  Intake              940 ml  Output             3200 ml  Net            -2260 ml   Filed Weights   08/17/17 2004 08/17/17 2339 08/24/17 0605  Weight: 95.7 kg (210 lb 15.7 oz) 95.7 kg (210 lb 15.7 oz) 93.5 kg (206 lb 2.1 oz)   Exam  General: Well developed, chronically ill-appearing, NAD  HEENT: NCAT,  mucous membranes moist.   Cardiovascular: S1 S2 auscultated, 2/6SEM, RRR  Respiratory: Coarse breath sounds  Abdomen: Soft, nontender, nondistended, + bowel sounds  Extremities: warm dry without cyanosis clubbing or edema  Neuro: AAOx1 (self), nonfocal  Psych: Appropriate  Data Reviewed: I have personally reviewed following labs and imaging studies  CBC:  Recent Labs Lab 08/20/17 0212 08/21/17 0232 08/22/17 0237 08/23/17 0306 08/24/17 0159  WBC 9.8 9.9 10.3 13.0* 12.6*  HGB 8.6* 8.5* 8.5* 8.9* 9.0*  HCT 27.0* 25.9* 26.6* 28.1* 28.2*  MCV 95.4 95.6 95.0 95.9 97.2  PLT 159 153 174 194 210   Basic Metabolic Panel:  Recent Labs Lab 08/20/17 0212 08/21/17 0232 08/22/17 0237 08/22/17 1043 08/23/17 0306 08/24/17 0159  NA 138 140 142  --  146* 142  K 4.1 4.3 4.2  --  3.8 4.4  CL 109 108 111  --  113* 109  CO2 18* 18* 20*  --  23 22  GLUCOSE 395* 342* 371*  --  214* 333*  BUN 83* 91* 79*  --  61* 51*  CREATININE 2.90* 2.80* 2.28*  --  1.72* 1.72*  CALCIUM 7.3* 7.1* 7.3*  --  7.7* 7.8*  MG  --   --   --  2.1  --   --    GFR: Estimated Creatinine Clearance: 37.4 mL/min (A) (by C-G formula based on SCr of 1.72 mg/dL (H)). Liver Function Tests: No results for input(s): AST, ALT, ALKPHOS, BILITOT, PROT, ALBUMIN  in the last 168 hours. No results for input(s): LIPASE, AMYLASE in the last 168 hours. No results for input(s): AMMONIA in the last 168 hours. Coagulation Profile:  Recent Labs Lab 08/17/17 1539  INR 1.16   Cardiac Enzymes:  Recent Labs Lab 08/17/17 1539 08/17/17 2015 08/18/17 0217 08/18/17 0936  TROPONINI 3.10* 4.24* 3.18* 1.68*   BNP (last 3 results) No results for input(s): PROBNP in the last 8760 hours. HbA1C: No results for input(s): HGBA1C in the last 72 hours. CBG:  Recent Labs Lab 08/23/17 0808 08/23/17 1228 08/23/17 1619 08/23/17 2149 08/24/17 0822  GLUCAP 181* 288* 378* 334* 300*   Lipid Profile: No results for input(s): CHOL, HDL, LDLCALC, TRIG, CHOLHDL, LDLDIRECT in the last 72 hours. Thyroid Function Tests: No results for input(s): TSH, T4TOTAL, FREET4, T3FREE, THYROIDAB in the last 72 hours. Anemia Panel: No results for input(s): VITAMINB12, FOLATE, FERRITIN, TIBC, IRON, RETICCTPCT in the last 72 hours. Urine analysis:    Component Value Date/Time   COLORURINE YELLOW 08/23/2017 0928   APPEARANCEUR HAZY (A) 08/23/2017 0928   LABSPEC 1.015 08/23/2017 0928   PHURINE 5.0 08/23/2017 0928   GLUCOSEU 50 (A) 08/23/2017 0928   HGBUR SMALL (A) 08/23/2017 0928   BILIRUBINUR NEGATIVE 08/23/2017 0928   KETONESUR NEGATIVE 08/23/2017 0928   PROTEINUR 100 (A) 08/23/2017 0928   UROBILINOGEN 0.2 06/28/2014 2020   NITRITE NEGATIVE 08/23/2017 0928   LEUKOCYTESUR SMALL (A) 08/23/2017 0928   Sepsis Labs: @LABRCNTIP (procalcitonin:4,lacticidven:4)  ) Recent Results (from the past 240 hour(s))  Blood Culture (routine x 2)     Status: Abnormal   Collection Time: 08/17/17  2:45 PM  Result Value Ref Range Status   Specimen Description BLOOD RIGHT HAND  Final   Special Requests   Final    BOTTLES DRAWN AEROBIC AND ANAEROBIC Blood Culture adequate volume   Culture  Setup Time   Final    GRAM POSITIVE COCCI IN CLUSTERS IN BOTH AEROBIC AND ANAEROBIC  BOTTLES CRITICAL RESULT CALLED TO, READ BACK BY AND VERIFIED WITH: M MACCIA,PHARMD AT 1113 08/18/17 BY L BENFIELD    Culture (A)  Final    STAPHYLOCOCCUS SPECIES (COAGULASE NEGATIVE) THE SIGNIFICANCE OF ISOLATING THIS ORGANISM FROM A SINGLE SET OF BLOOD CULTURES WHEN MULTIPLE SETS ARE DRAWN IS UNCERTAIN. PLEASE NOTIFY THE MICROBIOLOGY DEPARTMENT WITHIN ONE WEEK IF SPECIATION AND SENSITIVITIES ARE REQUIRED.    Report Status 08/20/2017 FINAL  Final  Blood Culture ID Panel (Reflexed)     Status: Abnormal   Collection Time: 08/17/17  2:45 PM  Result Value Ref Range Status   Enterococcus  species NOT DETECTED NOT DETECTED Final   Listeria monocytogenes NOT DETECTED NOT DETECTED Final   Staphylococcus species DETECTED (A) NOT DETECTED Final    Comment: Methicillin (oxacillin) resistant coagulase negative staphylococcus. Possible blood culture contaminant (unless isolated from more than one blood culture draw or clinical case suggests pathogenicity). No antibiotic treatment is indicated for blood  culture contaminants. CRITICAL RESULT CALLED TO, READ BACK BY AND VERIFIED WITH: M MACCIA,PHARMD AT 1113 08/18/17 BY L BENFIELD    Staphylococcus aureus NOT DETECTED NOT DETECTED Final   Methicillin resistance DETECTED (A) NOT DETECTED Final    Comment: CRITICAL RESULT CALLED TO, READ BACK BY AND VERIFIED WITH: M MACCIA,PHARMD AT 1113 08/18/17 BY L BENFIELD    Streptococcus species NOT DETECTED NOT DETECTED Final   Streptococcus agalactiae NOT DETECTED NOT DETECTED Final   Streptococcus pneumoniae NOT DETECTED NOT DETECTED Final   Streptococcus pyogenes NOT DETECTED NOT DETECTED Final   Acinetobacter baumannii NOT DETECTED NOT DETECTED Final   Enterobacteriaceae species NOT DETECTED NOT DETECTED Final   Enterobacter cloacae complex NOT DETECTED NOT DETECTED Final   Escherichia coli NOT DETECTED NOT DETECTED Final   Klebsiella oxytoca NOT DETECTED NOT DETECTED Final   Klebsiella pneumoniae NOT DETECTED  NOT DETECTED Final   Proteus species NOT DETECTED NOT DETECTED Final   Serratia marcescens NOT DETECTED NOT DETECTED Final   Haemophilus influenzae NOT DETECTED NOT DETECTED Final   Neisseria meningitidis NOT DETECTED NOT DETECTED Final   Pseudomonas aeruginosa NOT DETECTED NOT DETECTED Final   Candida albicans NOT DETECTED NOT DETECTED Final   Candida glabrata NOT DETECTED NOT DETECTED Final   Candida krusei NOT DETECTED NOT DETECTED Final   Candida parapsilosis NOT DETECTED NOT DETECTED Final   Candida tropicalis NOT DETECTED NOT DETECTED Final  Blood Culture (routine x 2)     Status: None   Collection Time: 08/17/17  3:39 PM  Result Value Ref Range Status   Specimen Description BLOOD RIGHT HAND  Final   Special Requests   Final    BOTTLES DRAWN AEROBIC AND ANAEROBIC Blood Culture adequate volume   Culture NO GROWTH 5 DAYS  Final   Report Status 08/22/2017 FINAL  Final  Respiratory Panel by PCR     Status: Abnormal   Collection Time: 08/17/17  3:52 PM  Result Value Ref Range Status   Adenovirus NOT DETECTED NOT DETECTED Final   Coronavirus 229E NOT DETECTED NOT DETECTED Final   Coronavirus HKU1 NOT DETECTED NOT DETECTED Final   Coronavirus NL63 NOT DETECTED NOT DETECTED Final   Coronavirus OC43 NOT DETECTED NOT DETECTED Final   Metapneumovirus NOT DETECTED NOT DETECTED Final   Rhinovirus / Enterovirus DETECTED (A) NOT DETECTED Final   Influenza A NOT DETECTED NOT DETECTED Final   Influenza B NOT DETECTED NOT DETECTED Final   Parainfluenza Virus 1 NOT DETECTED NOT DETECTED Final   Parainfluenza Virus 2 NOT DETECTED NOT DETECTED Final   Parainfluenza Virus 3 NOT DETECTED NOT DETECTED Final   Parainfluenza Virus 4 NOT DETECTED NOT DETECTED Final   Respiratory Syncytial Virus NOT DETECTED NOT DETECTED Final   Bordetella pertussis NOT DETECTED NOT DETECTED Final   Chlamydophila pneumoniae NOT DETECTED NOT DETECTED Final   Mycoplasma pneumoniae NOT DETECTED NOT DETECTED Final   Urine culture     Status: None   Collection Time: 08/17/17  3:54 PM  Result Value Ref Range Status   Specimen Description URINE, CATHETERIZED  Final   Special Requests NONE  Final   Culture NO  GROWTH  Final   Report Status 08/18/2017 FINAL  Final  MRSA PCR Screening     Status: None   Collection Time: 08/17/17  8:05 PM  Result Value Ref Range Status   MRSA by PCR NEGATIVE NEGATIVE Final    Comment:        The GeneXpert MRSA Assay (FDA approved for NASAL specimens only), is one component of a comprehensive MRSA colonization surveillance program. It is not intended to diagnose MRSA infection nor to guide or monitor treatment for MRSA infections.   Urine culture     Status: None   Collection Time: 08/18/17 10:17 AM  Result Value Ref Range Status   Specimen Description URINE, CATHETERIZED  Final   Special Requests NONE  Final   Culture NO GROWTH  Final   Report Status 08/19/2017 FINAL  Final      Radiology Studies: Dg Chest Port 1 View  Result Date: 08/23/2017 CLINICAL DATA:  Followup pneumonia EXAM: PORTABLE CHEST 1 VIEW COMPARISON:  08/18/2017 FINDINGS: Previous median sternotomy and CABG. Bilateral lower lobe pneumonia, left more than right, is improved with slightly less consolidation particularly in the left lower lobe. No worsening or new finding. No effusion. No acute bone finding. IMPRESSION: Improved bilateral lower lobe density consistent with improving pneumonia. Mild persistent density left more than right. Electronically Signed   By: Paulina Fusi M.D.   On: 08/23/2017 08:00     Scheduled Meds: . amLODipine  2.5 mg Oral Daily  . arformoterol  15 mcg Nebulization BID  . aspirin EC  81 mg Oral Daily  . atorvastatin  20 mg Oral QHS  . budesonide (PULMICORT) nebulizer solution  0.25 mg Nebulization BID  . cholecalciferol  2,000 Units Oral Daily  . divalproex  250 mg Oral TID  . memantine  28 mg Oral QHS   And  . donepezil  10 mg Oral QHS  . ferrous sulfate  325  mg Oral Q breakfast  . furosemide  80 mg Oral Daily  . gabapentin  200 mg Oral QHS  . heparin  5,000 Units Subcutaneous Q8H  . hydrALAZINE  75 mg Oral Q8H  . insulin aspart  0-20 Units Subcutaneous TID WC  . insulin aspart  0-5 Units Subcutaneous QHS  . insulin aspart  7 Units Subcutaneous TID WC  . insulin glargine  20 Units Subcutaneous QHS  . ipratropium  0.5 mg Nebulization TID   And  . levalbuterol  0.63 mg Nebulization TID  . labetalol  5 mg Intravenous Once  . mouth rinse  15 mL Mouth Rinse BID  . predniSONE  40 mg Oral Q breakfast  . saccharomyces boulardii  250 mg Oral BID  . sulfacetamide  1 drop Both Eyes QID   Continuous Infusions: . ceFEPime (MAXIPIME) IV 1 g (08/24/17 1016)     LOS: 7 days   Time Spent in minutes   45 minutes  Sybil Shrader D.O. on 08/24/2017 at 11:26 AM  Between 7am to 7pm - Pager - [phone]  After 7pm go to [email or web address] - password [email or web address]  And look for the night coverage person covering for me after hours  [facility] Office  [phone]

## 2017-08-25 LAB — GLUCOSE, CAPILLARY
GLUCOSE-CAPILLARY: 216 mg/dL — AB (ref 65–99)
GLUCOSE-CAPILLARY: 254 mg/dL — AB (ref 65–99)
Glucose-Capillary: 225 mg/dL — ABNORMAL HIGH (ref 65–99)
Glucose-Capillary: 298 mg/dL — ABNORMAL HIGH (ref 65–99)

## 2017-08-25 LAB — BASIC METABOLIC PANEL
ANION GAP: 10 (ref 5–15)
BUN: 51 mg/dL — ABNORMAL HIGH (ref 6–20)
CALCIUM: 8 mg/dL — AB (ref 8.9–10.3)
CO2: 24 mmol/L (ref 22–32)
Chloride: 108 mmol/L (ref 101–111)
Creatinine, Ser: 1.72 mg/dL — ABNORMAL HIGH (ref 0.61–1.24)
GFR, EST AFRICAN AMERICAN: 41 mL/min — AB (ref >60)
GFR, EST NON AFRICAN AMERICAN: 35 mL/min — AB (ref >60)
Glucose, Bld: 282 mg/dL — ABNORMAL HIGH (ref 65–99)
POTASSIUM: 4.9 mmol/L (ref 3.5–5.1)
Sodium: 142 mmol/L (ref 135–145)

## 2017-08-25 LAB — CBC
HEMATOCRIT: 29 % — AB (ref 39.0–52.0)
Hemoglobin: 9.5 g/dL — ABNORMAL LOW (ref 13.0–17.0)
MCH: 31.5 pg (ref 26.0–34.0)
MCHC: 32.8 g/dL (ref 30.0–36.0)
MCV: 96 fL (ref 78.0–100.0)
PLATELETS: 195 10*3/uL (ref 150–400)
RBC: 3.02 MIL/uL — AB (ref 4.22–5.81)
RDW: 14.4 % (ref 11.5–15.5)
WBC: 13.9 10*3/uL — AB (ref 4.0–10.5)

## 2017-08-25 MED ORDER — INSULIN ASPART 100 UNIT/ML ~~LOC~~ SOLN
10.0000 [IU] | Freq: Three times a day (TID) | SUBCUTANEOUS | Status: DC
  Administered 2017-08-25 – 2017-08-27 (×6): 10 [IU] via SUBCUTANEOUS

## 2017-08-25 MED ORDER — PREDNISONE 20 MG PO TABS
20.0000 mg | ORAL_TABLET | Freq: Every day | ORAL | Status: DC
  Administered 2017-08-25 – 2017-08-27 (×3): 20 mg via ORAL
  Filled 2017-08-25 (×3): qty 1

## 2017-08-25 MED ORDER — INSULIN GLARGINE 100 UNIT/ML ~~LOC~~ SOLN
23.0000 [IU] | Freq: Every day | SUBCUTANEOUS | Status: DC
  Administered 2017-08-25 – 2017-08-26 (×2): 23 [IU] via SUBCUTANEOUS
  Filled 2017-08-25 (×2): qty 0.23

## 2017-08-25 NOTE — Progress Notes (Signed)
  Speech Language Pathology Treatment: Dysphagia  Patient Details Name: Travis Coleman MRN: [id number] DOB: 1932/01/10 Today's Date: 08/25/2017 Time: 4098-1191 SLP Time Calculation (min) (ACUTE ONLY): 12 min  Assessment / Plan / Recommendation Clinical Impression  No change in clinical swallow function - toleration of dys2 diet, nectar-thick liquids appears to be adequate.  Last CXR 10/5 demonstrated improvements; BS continue with rhonchi upper, diminished lower lobes bilaterally.  Persisting wheeze.  Pt asking for drinks and hydrating well per RN, who has been vigilant about thickening liquids to nectar.  Pt needs verbal/visual cues and assist with self-feeding; no impulsivity noted with drinking; amenable to cues and easily redirected today. Recommend maintaining current diet.  SLP will follow.  HPI HPI: 83 year old male who is HOH with history of CAD status post CABG, CHF, chronic kidney disease stage III, advanced dementia, hypertension, hyperlipidemia, PVD presented from [facility] skilled nursing facility for shortness of breath.  Dx Acute on chronic resp failure with clinical pna, sepsis, NSTEMI. Objective test recommended following meal observation.   10/5 CXR: Improved bilateral lower lobe density consistent with improving pneumonia. Mild persistent density left more than right.      SLP Plan  Continue with current plan of care       Recommendations  Diet recommendations: Dysphagia 2 (fine chop);Nectar-thick liquid Liquids provided via: Cup;No straw Medication Administration: Crushed with puree Supervision: Patient able to self feed;Staff to assist with self feeding;Full supervision/cueing for compensatory strategies Compensations: Minimize environmental distractions;Slow rate;Small sips/bites Postural Changes and/or Swallow Maneuvers: Seated upright 90 degrees                Oral Care Recommendations: Oral care BID Follow up Recommendations: Skilled Nursing facility SLP  Visit Diagnosis: Dysphagia, oropharyngeal phase (R13.12) Plan: Continue with current plan of care       GO                Blenda Mounts Laurice 08/25/2017, 3:34 PM

## 2017-08-25 NOTE — Progress Notes (Signed)
PROGRESS NOTE    Travis Coleman  YQM:[id number] DOB: Apr 04, 1932 DOA: 08/17/2017 PCP: Florentina Jenny, MD   Chief Complaint  Patient presents with  . Shortness of Breath    Brief Narrative:  HPI On 08/17/2017 by Dr. Thad Ranger Patient is a 83 year old male with history of CAD status post CABG, CHF, chronic kidney disease stage III, advanced dementia, hypertension, hyperlipidemia, PVD presented from [facility] skilled nursing facility for shortness of breath. History was obtained from the ER records and patient's sister at the bedside. Per patient's sister, patient had shortness of breath in the last 2-3 days at the facility and patient was placed on nebulizer treatments, Levaquin. He also received Lasix yesterday. She did not report any fevers. Today, EMS was called for acute respiratory distress and hypoxia, was placed on BiPAP in ED. Patient was noted to have diffuse rhonchi and wheezing. At baseline, patient is wheelchair bound, does not ambulate. [name] mentioned that patient was coughing and appeared to be choking on food yesterday. At the time of my encounter, patient is on BiPAP, has advanced dementia and unable to provide any history.  Interim history Admitted for respiratory failure with viral infection and CHF exacerbation. Patient had AKI and lasix held. Continues to be volume overloaded.Creatinine improved, started PO lasix. Patient with elevated WBC, restarted on cefepime, given that CXR noted improvement in pneumonia.  Assessment & Plan   Acute on chronic hypoxic respiratory failure -Likely secondary to acute bronchitis/viral source -Presented with acute history distress and wheezing -Recquired BiPAP initially -continue pulmicort, Brovana -VQ Scan negative -Currently on room air with oxygen saturations in high 90s -will continue to wean prednisone  Sepsis secondary to Rhinovirus/Enterovirus/ possible pneumonia -Present with fever, tachycardia, tachypnea -Respiratory  viral panel positive for rhinovirus/enterovirus -influenza PCR negative -Urine strep pneumonia and legionella antigens negative -Blood cultures 1/2 coag-negative staph, likely contaminant -was placed vanc and zosyn, discontinued on 08/21/2017. Will continue to monitor closely -Increase in leukocytosis today, Repeated CXR today: improved B/L lower lobe density consistent with improving pneumonia, mild persistent density L > R -Repeat UA unremarkable for infection -Continue cefepime for possible pneumonia  Acute diastolic heart failure -Echocardiogram showed an EF of 60-65%, intermediate diastolic dysfunction -Lasix held due to worsening renal function -Cardiology consulted and appreciated -Monitor intake and output, daily weights -Continue PO lasix 80mg  daily (discussed with Dr. Algie Coffer) -Urine output over the past 24 hours 2850 mL  -Continue to monitor BMP closely  NSTEMI  -S/p CABG -Cardiology consulted and appreciated, continue medical treatment -Echocardiogram showed EF 60-65%, no wall motion abnormalities -Continue aspirin, statin -Was placed on IV heparin which was discontinued on 08/20/2017  Dyslipidemia -Continue statin  Diabetes mellitus, type II with nephropathy, complicated by hyperglycemia -Continue Lantus, insulin sliding scale, CBG monitoring -Hyperglycemia likely secondary to steroid use -Increased lantus to 23units, increased premeal insulin  Acute kidney injury on chronic kidney disease, stage III -Baseline creatinine from 1.3-1.4 -Advised admission, patient was given fluid due to sepsis however now chest x-ray did show volume overload -IV fluids were discontinued, patient was given IV Lasix however this is held due to worsening creatinine -Renal ultrasound: No obstruction, bilateral cortical atrophy, increased cortical echotexture consistent with medical renal disease -discontinued vancomycin and zosyn on 10/3 -Creatinine currently 1.72 (stable) -Continue to  monitor BMP  Dementia -Continue Namenda, Aricept  Goals of care -Palliative care consulted and appreciated -Recommended patient have palliative care to follow as an outpatient -Patient has partial code, only BiPAP  DVT Prophylaxis  heparin  Code Status: partial, BiPAP   Family Communication: None at bedside  Disposition Plan: Admitted. SNF at discharge when stable- likely in 24hrs  Consultants Cardiology Palliative care  Procedures  Echocardiogram Renal US V/Q scan  Antibiotics   Anti-infectives    Start     Dose/Rate Route Frequency Ordered Stop   08/23/17 1100  ceFEPIme (MAXIPIME) 1 g in dextrose 5 % 50 mL IVPB     1 g 100 mL/hr over 30 Minutes Intravenous Every 24 hours 08/23/17 1057     08/21/17 1606  vancomycin (VANCOCIN) IVPB 1000 mg/200 mL premix  Status:  Discontinued     1,000 mg 200 mL/hr over 60 Minutes Intravenous Every 48 hours 08/20/17 1259 08/21/17 1209   08/19/17 1800  piperacillin-tazobactam (ZOSYN) IVPB 3.375 g  Status:  Discontinued     3.375 g 12.5 mL/hr over 240 Minutes Intravenous Every 8 hours 08/19/17 1315 08/21/17 1209   08/18/17 1600  vancomycin (VANCOCIN) IVPB 1000 mg/200 mL premix  Status:  Discontinued     1,000 mg 200 mL/hr over 60 Minutes Intravenous Every 24 hours 08/17/17 1631 08/20/17 1259   08/17/17 2345  piperacillin-tazobactam (ZOSYN) IVPB 3.375 g  Status:  Discontinued     3.375 g 12.5 mL/hr over 240 Minutes Intravenous Every 8 hours 08/17/17 1631 08/19/17 1315   08/17/17 1545  piperacillin-tazobactam (ZOSYN) IVPB 3.375 g     3.375 g 100 mL/hr over 30 Minutes Intravenous  Once 08/17/17 1530 08/17/17 1616   08/17/17 1545  vancomycin (VANCOCIN) IVPB 1000 mg/200 mL premix     1,000 mg 200 mL/hr over 60 Minutes Intravenous  Once 08/17/17 1530 08/17/17 1657      Subjective:   [name] seen and examined today.  Patient has no complaints today.   Objective:   Vitals:   08/25/17 0535 08/25/17 0750 08/25/17 0800 08/25/17  1135  BP:    (!) 163/54  Pulse:    85  Resp:    20  Temp:   98.5 F (36.9 C) (!) 97.4 F (36.3 C)  TempSrc:   Oral Oral  SpO2:  99% 95% 99%  Weight: 92.4 kg (203 lb 11.3 oz)     Height:        Intake/Output Summary (Last 24 hours) at 08/25/17 1258 Last data filed at 08/25/17 1135  Gross per 24 hour  Intake              650 ml  Output             3100 ml  Net            -2450 ml   Filed Weights   08/17/17 2339 08/24/17 0605 08/25/17 0535  Weight: 95.7 kg (210 lb 15.7 oz) 93.5 kg (206 lb 2.1 oz) 92.4 kg (203 lb 11.3 oz)   Exam  General: Well developed, chronically-ill appearing, NAD  HEENT: NCAT, mucous membranes moist.   Cardiovascular: S1 S2 auscultated, 2/6 SEM, RRR  Respiratory: Coarse breath sounds  Abdomen: Soft, nontender, nondistended, + bowel sounds  Extremities: warm dry without cyanosis clubbing. Trace LE edema B/L   Neuro: AAOx1 (self), nonfocal  Psych: Pleasant, appropriate  Data Reviewed: I have personally reviewed following labs and imaging studies  CBC:  Recent Labs Lab 08/21/17 0232 08/22/17 0237 08/23/17 0306 08/24/17 0159 08/25/17 0250  WBC 9.9 10.3 13.0* 12.6* 13.9*  HGB 8.5* 8.5* 8.9* 9.0* 9.5*  HCT 25.9* 26.6* 28.1* 28.2* 29.0*  MCV 95.6 95.0 95.9 97.2 96.0  PLT  153 174 194 210 195   Basic Metabolic Panel:  Recent Labs Lab 08/21/17 0232 08/22/17 0237 08/22/17 1043 08/23/17 0306 08/24/17 0159 08/25/17 0250  NA 140 142  --  146* 142 142  K 4.3 4.2  --  3.8 4.4 4.9  CL 108 111  --  113* 109 108  CO2 18* 20*  --  GLUCOSE 342* 371*  --  214* 333* 282*  BUN 91* 79*  --  61* 51* 51*  CREATININE 2.80* 2.28*  --  1.72* 1.72* 1.72*  CALCIUM 7.1* 7.3*  --  7.7* 7.8* 8.0*  MG  --   --  2.1  --   --   --    GFR: Estimated Creatinine Clearance: 37.2 mL/min (A) (by C-G formula based on SCr of 1.72 mg/dL (H)). Liver Function Tests: No results for input(s): AST, ALT, ALKPHOS, BILITOT, PROT, ALBUMIN in the last 168  hours. No results for input(s): LIPASE, AMYLASE in the last 168 hours. No results for input(s): AMMONIA in the last 168 hours. Coagulation Profile: No results for input(s): INR, PROTIME in the last 168 hours. Cardiac Enzymes: No results for input(s): CKTOTAL, CKMB, CKMBINDEX, TROPONINI in the last 168 hours. BNP (last 3 results) No results for input(s): PROBNP in the last 8760 hours. HbA1C: No results for input(s): HGBA1C in the last 72 hours. CBG:  Recent Labs Lab 08/24/17 1128 08/24/17 1607 08/24/17 2121 08/25/17 0841 08/25/17 1127  GLUCAP 365* 354* 277* 225* 216*   Lipid Profile: No results for input(s): CHOL, HDL, LDLCALC, TRIG, CHOLHDL, LDLDIRECT in the last 72 hours. Thyroid Function Tests: No results for input(s): TSH, T4TOTAL, FREET4, T3FREE, THYROIDAB in the last 72 hours. Anemia Panel: No results for input(s): VITAMINB12, FOLATE, FERRITIN, TIBC, IRON, RETICCTPCT in the last 72 hours. Urine analysis:    Component Value Date/Time   COLORURINE YELLOW 08/23/2017 0928   APPEARANCEUR HAZY (A) 08/23/2017 0928   LABSPEC 1.015 08/23/2017 0928   PHURINE 5.0 08/23/2017 0928   GLUCOSEU 50 (A) 08/23/2017 0928   HGBUR SMALL (A) 08/23/2017 0928   BILIRUBINUR NEGATIVE 08/23/2017 0928   KETONESUR NEGATIVE 08/23/2017 0928   PROTEINUR 100 (A) 08/23/2017 0928   UROBILINOGEN 0.2 06/28/2014 2020   NITRITE NEGATIVE 08/23/2017 0928   LEUKOCYTESUR SMALL (A) 08/23/2017 0928   Sepsis Labs: (procalcitonin:4,lacticidven:4)  ) Recent Results (from the past 240 hour(s))  Blood Culture (routine x 2)     Status: Abnormal   Collection Time: 08/17/17  2:45 PM  Result Value Ref Range Status   Specimen Description BLOOD RIGHT HAND  Final   Special Requests   Final    BOTTLES DRAWN AEROBIC AND ANAEROBIC Blood Culture adequate volume   Culture  Setup Time   Final    GRAM POSITIVE COCCI IN CLUSTERS IN BOTH AEROBIC AND ANAEROBIC BOTTLES CRITICAL RESULT CALLED TO, READ BACK BY AND  VERIFIED WITH: M MACCIA,PHARMD AT 1113 08/18/17 BY L BENFIELD    Culture (A)  Final    STAPHYLOCOCCUS SPECIES (COAGULASE NEGATIVE) THE SIGNIFICANCE OF ISOLATING THIS ORGANISM FROM A SINGLE SET OF BLOOD CULTURES WHEN MULTIPLE SETS ARE DRAWN IS UNCERTAIN. PLEASE NOTIFY THE MICROBIOLOGY DEPARTMENT WITHIN ONE WEEK IF SPECIATION AND SENSITIVITIES ARE REQUIRED.    Report Status 08/20/2017 FINAL  Final  Blood Culture ID Panel (Reflexed)     Status: Abnormal   Collection Time: 08/17/17  2:45 PM  Result Value Ref Range Status   Enterococcus species NOT DETECTED NOT DETECTED Final  Listeria monocytogenes NOT DETECTED NOT DETECTED Final   Staphylococcus species DETECTED (A) NOT DETECTED Final    Comment: Methicillin (oxacillin) resistant coagulase negative staphylococcus. Possible blood culture contaminant (unless isolated from more than one blood culture draw or clinical case suggests pathogenicity). No antibiotic treatment is indicated for blood  culture contaminants. CRITICAL RESULT CALLED TO, READ BACK BY AND VERIFIED WITH: M MACCIA,PHARMD AT 1113 08/18/17 BY L BENFIELD    Staphylococcus aureus NOT DETECTED NOT DETECTED Final   Methicillin resistance DETECTED (A) NOT DETECTED Final    Comment: CRITICAL RESULT CALLED TO, READ BACK BY AND VERIFIED WITH: M MACCIA,PHARMD AT 1113 08/18/17 BY L BENFIELD    Streptococcus species NOT DETECTED NOT DETECTED Final   Streptococcus agalactiae NOT DETECTED NOT DETECTED Final   Streptococcus pneumoniae NOT DETECTED NOT DETECTED Final   Streptococcus pyogenes NOT DETECTED NOT DETECTED Final   Acinetobacter baumannii NOT DETECTED NOT DETECTED Final   Enterobacteriaceae species NOT DETECTED NOT DETECTED Final   Enterobacter cloacae complex NOT DETECTED NOT DETECTED Final   Escherichia coli NOT DETECTED NOT DETECTED Final   Klebsiella oxytoca NOT DETECTED NOT DETECTED Final   Klebsiella pneumoniae NOT DETECTED NOT DETECTED Final   Proteus species NOT DETECTED  NOT DETECTED Final   Serratia marcescens NOT DETECTED NOT DETECTED Final   Haemophilus influenzae NOT DETECTED NOT DETECTED Final   Neisseria meningitidis NOT DETECTED NOT DETECTED Final   Pseudomonas aeruginosa NOT DETECTED NOT DETECTED Final   Candida albicans NOT DETECTED NOT DETECTED Final   Candida glabrata NOT DETECTED NOT DETECTED Final   Candida krusei NOT DETECTED NOT DETECTED Final   Candida parapsilosis NOT DETECTED NOT DETECTED Final   Candida tropicalis NOT DETECTED NOT DETECTED Final  Blood Culture (routine x 2)     Status: None   Collection Time: 08/17/17  3:39 PM  Result Value Ref Range Status   Specimen Description BLOOD RIGHT HAND  Final   Special Requests   Final    BOTTLES DRAWN AEROBIC AND ANAEROBIC Blood Culture adequate volume   Culture NO GROWTH 5 DAYS  Final   Report Status 08/22/2017 FINAL  Final  Respiratory Panel by PCR     Status: Abnormal   Collection Time: 08/17/17  3:52 PM  Result Value Ref Range Status   Adenovirus NOT DETECTED NOT DETECTED Final   Coronavirus 229E NOT DETECTED NOT DETECTED Final   Coronavirus HKU1 NOT DETECTED NOT DETECTED Final   Coronavirus NL63 NOT DETECTED NOT DETECTED Final   Coronavirus OC43 NOT DETECTED NOT DETECTED Final   Metapneumovirus NOT DETECTED NOT DETECTED Final   Rhinovirus / Enterovirus DETECTED (A) NOT DETECTED Final   Influenza A NOT DETECTED NOT DETECTED Final   Influenza B NOT DETECTED NOT DETECTED Final   Parainfluenza Virus 1 NOT DETECTED NOT DETECTED Final   Parainfluenza Virus 2 NOT DETECTED NOT DETECTED Final   Parainfluenza Virus 3 NOT DETECTED NOT DETECTED Final   Parainfluenza Virus 4 NOT DETECTED NOT DETECTED Final   Respiratory Syncytial Virus NOT DETECTED NOT DETECTED Final   Bordetella pertussis NOT DETECTED NOT DETECTED Final   Chlamydophila pneumoniae NOT DETECTED NOT DETECTED Final   Mycoplasma pneumoniae NOT DETECTED NOT DETECTED Final  Urine culture     Status: None   Collection Time:  08/17/17  3:54 PM  Result Value Ref Range Status   Specimen Description URINE, CATHETERIZED  Final   Special Requests NONE  Final   Culture NO GROWTH  Final   Report Status 08/18/2017  FINAL  Final  MRSA PCR Screening     Status: None   Collection Time: 08/17/17  8:05 PM  Result Value Ref Range Status   MRSA by PCR NEGATIVE NEGATIVE Final    Comment:        The GeneXpert MRSA Assay (FDA approved for NASAL specimens only), is one component of a comprehensive MRSA colonization surveillance program. It is not intended to diagnose MRSA infection nor to guide or monitor treatment for MRSA infections.   Urine culture     Status: None   Collection Time: 08/18/17 10:17 AM  Result Value Ref Range Status   Specimen Description URINE, CATHETERIZED  Final   Special Requests NONE  Final   Culture NO GROWTH  Final   Report Status 08/19/2017 FINAL  Final      Radiology Studies: No results found.   Scheduled Meds: . amLODipine  2.5 mg Oral Daily  . arformoterol  15 mcg Nebulization BID  . aspirin EC  81 mg Oral Daily  . atorvastatin  20 mg Oral QHS  . budesonide (PULMICORT) nebulizer solution  0.25 mg Nebulization BID  . cholecalciferol  2,000 Units Oral Daily  . divalproex  250 mg Oral TID  . memantine  28 mg Oral QHS   And  . donepezil  10 mg Oral QHS  . ferrous sulfate  325 mg Oral Q breakfast  . furosemide  80 mg Oral Daily  . gabapentin  200 mg Oral QHS  . heparin  5,000 Units Subcutaneous Q8H  . hydrALAZINE  75 mg Oral Q8H  . insulin aspart  0-20 Units Subcutaneous TID WC  . insulin aspart  0-5 Units Subcutaneous QHS  . insulin aspart  10 Units Subcutaneous TID WC  . insulin glargine  23 Units Subcutaneous QHS  . ipratropium  0.5 mg Nebulization TID   And  . levalbuterol  0.63 mg Nebulization TID  . labetalol  5 mg Intravenous Once  . mouth rinse  15 mL Mouth Rinse BID  . predniSONE  20 mg Oral Q breakfast  . saccharomyces boulardii  250 mg Oral BID  . sulfacetamide   1 drop Both Eyes QID   Continuous Infusions: . ceFEPime (MAXIPIME) IV 1 g (08/25/17 1128)     LOS: 8 days   Time Spent in minutes  30 minutes  Renatta Shrieves D.O. on 08/25/2017 at 12:58 PM  Between 7am to 7pm - Pager - [phone]  After 7pm go to [email or web address] - password [email or web address]  And look for the night coverage person covering for me after hours  [facility] Office  [phone]

## 2017-08-26 LAB — GLUCOSE, CAPILLARY
GLUCOSE-CAPILLARY: 307 mg/dL — AB (ref 65–99)
GLUCOSE-CAPILLARY: 125 mg/dL — AB (ref 65–99)
GLUCOSE-CAPILLARY: 207 mg/dL — AB (ref 65–99)
Glucose-Capillary: 222 mg/dL — ABNORMAL HIGH (ref 65–99)

## 2017-08-26 LAB — CBC
HCT: 32.4 % — ABNORMAL LOW (ref 39.0–52.0)
Hemoglobin: 10 g/dL — ABNORMAL LOW (ref 13.0–17.0)
MCH: 29.9 pg (ref 26.0–34.0)
MCHC: 30.9 g/dL (ref 30.0–36.0)
MCV: 97 fL (ref 78.0–100.0)
PLATELETS: 185 10*3/uL (ref 150–400)
RBC: 3.34 MIL/uL — AB (ref 4.22–5.81)
RDW: 14.4 % (ref 11.5–15.5)
WBC: 13.7 10*3/uL — ABNORMAL HIGH (ref 4.0–10.5)

## 2017-08-26 LAB — BASIC METABOLIC PANEL
Anion gap: 12 (ref 5–15)
BUN: 51 mg/dL — AB (ref 6–20)
CALCIUM: 8 mg/dL — AB (ref 8.9–10.3)
CO2: 23 mmol/L (ref 22–32)
CREATININE: 1.65 mg/dL — AB (ref 0.61–1.24)
Chloride: 104 mmol/L (ref 101–111)
GFR calc Af Amer: 43 mL/min — ABNORMAL LOW (ref >60)
GFR, EST NON AFRICAN AMERICAN: 37 mL/min — AB (ref >60)
Glucose, Bld: 336 mg/dL — ABNORMAL HIGH (ref 65–99)
Potassium: 3.9 mmol/L (ref 3.5–5.1)
SODIUM: 139 mmol/L (ref 135–145)

## 2017-08-26 MED ORDER — INSULIN GLARGINE 100 UNIT/ML ~~LOC~~ SOLN: 8.0000 [IU] | Freq: Every day | SUBCUTANEOUS | 11 refills | Status: DC

## 2017-08-26 MED ORDER — BUDESONIDE 0.25 MG/2ML IN SUSP: 0.2500 mg | Freq: Two times a day (BID) | RESPIRATORY_TRACT | 12 refills | Status: DC

## 2017-08-26 MED ORDER — AMLODIPINE BESYLATE 2.5 MG PO TABS: 2.5000 mg | ORAL_TABLET | Freq: Every day | ORAL | 0 refills | Status: DC

## 2017-08-26 MED ORDER — FUROSEMIDE 80 MG PO TABS: 80.0000 mg | ORAL_TABLET | Freq: Every day | ORAL | 0 refills | Status: DC

## 2017-08-26 MED ORDER — INSULIN GLARGINE 100 UNIT/ML ~~LOC~~ SOLN: 20.0000 [IU] | Freq: Every day | SUBCUTANEOUS | 11 refills | Status: DC

## 2017-08-26 MED ORDER — ARFORMOTEROL TARTRATE 15 MCG/2ML IN NEBU: 15.0000 ug | INHALATION_SOLUTION | Freq: Two times a day (BID) | RESPIRATORY_TRACT | Status: AC

## 2017-08-26 MED ORDER — PREDNISONE 10 MG PO TABS: ORAL_TABLET | ORAL | 0 refills | Status: DC

## 2017-08-26 NOTE — Clinical Social Work Note (Signed)
Rep from [facility] [phone] came to assess pt, cannot accept pt.  CSW contacted back up facility, spoke to [facility], too late to send pt  tonight, reports know one in the bldg can take pt's information this late.  Pt will go to [facility] in the AM.  Following for DC planning.  Timesha Cervantez B. Gean Quint Clinical Social Work Dept Weekend Social Worker [phone] 8:19 PM

## 2017-08-26 NOTE — Discharge Instructions (Signed)
Acute Respiratory Distress Syndrome, Adult °Acute respiratory distress syndrome is a life-threatening condition in which fluid collects in the lungs. This prevents the lungs from filling with air and passing oxygen into the blood. This can cause the lungs and other vital organs to fail. The condition usually develops following an infection, illness, surgery, or injury. °What are the causes? °This condition may be caused by: °· An infection, such as sepsis or pneumonia. °· A serious injury to the head or chest. °· Severe bleeding from an injury. °· A major surgery. °· Breathing in harmful chemicals or smoke. °· Blood transfusions. °· A blood clot in the lungs. °· Breathing in vomit (aspiration). °· Near-drowning. °· Inflammation of the pancreas (pancreatitis). °· A drug overdose. °What are the signs or symptoms? °Sudden shortness of breath and rapid breathing are the main symptoms of this condition. Other symptoms may include: °· A fast or irregular heartbeat. °· Skin, lips, or fingernails that look blue (cyanosis). °· Confusion. °· Tiredness or loss of energy. °· Chest pain, particularly while taking a breath. °· Coughing. °· Restlessness or anxiety. °· Fever. This is usually present if there is an underlying infection, such as pneumonia. °How is this diagnosed? °This condition is diagnosed based on: °· Your symptoms. °· Medical history. °· A physical exam. During the exam, your health care provider will listen to your heart and check for crackling or wheezing sounds in your lungs. °You may also have other tests to confirm the diagnosis and measure how well your lungs are working. These may include: °· Measuring the amount of oxygen in your blood. Your health care provider will use two methods to do this procedure: °¨ A small device (pulse oximeter) that is placed on your finger, earlobe, or toe. °¨ An arterial blood gas test. A sample of blood is taken from an artery and tested for oxygen levels. °· Blood  tests. °· Chest X-rays or CT scans to look for fluid in the lungs. °· Taking a sample of your sputum to test for infection. °· Heart test, such as an echocardiogram or electrocardiogram. This is done to rule out any heart problems (such as heart failure) that may be causing your symptoms. °· Bronchoscopy. During this test, a thin, flexible tube with a light is passed into the mouth or nose, down the windpipe, and into the lungs. °How is this treated? °Treatment depends on the cause of your condition. The goal is to support you while your lungs heal and the underlying cause is treated. Treatment may include: °· Oxygen therapy. This may be done through: °¨ A tube in your nose or a face mask. °¨ A ventilator. This device helps move air into and out of your lungs through a breathing tube that is inserted into your mouth or nose. °· Continuous positive airway pressure (CPAP). This treatment uses mild air pressure to keep the airways open. A mask or other device will be placed over your nose or mouth. °· Tracheostomy. During this procedure, a small cut is made in your neck to create an opening to your windpipe. A breathing tube is placed directly into your windpipe. The breathing tube is connected to a ventilator. This is done if you have problems with your airway or if you need a ventilator for a long period of time. °· Positioning you to lie on your stomach (prone position). °· Medicines, such as: °¨ Sedatives to help you relax. °¨ Blood pressure medicines. °¨ Antibiotics to treat infection. °¨ Blood thinners   to prevent blood clots. °¨ Diuretics to help prevent excess fluid. °· Fluids and nutrients given through an IV tube. °· Wearing compression stockings on your legs to prevent blood clots. °· Extra corporeal membrane oxygenation (ECMO). This treatment takes blood outside your body, adds oxygen, and removes carbon dioxide. The blood is then returned to your body. This treatment is only used in severe cases. °Follow  these instructions at home: °· Take over-the-counter and prescription medicines only as told by your health care provider. °· Do not use any products that contain nicotine or tobacco, such as cigarettes and e-cigarettes. If you need help quitting, ask your health care provider. °· Limit alcohol intake to no more than 1 drink per day for nonpregnant women and 2 drinks per day for men. One drink equals 12 oz of beer, 5 oz of wine, or 1½ oz of hard liquor. °· Ask friends and family to help you if daily activities make you tired. °· Attend any pulmonary rehabilitation as told by your health care provider. This may include: °¨ Education about your condition. °¨ Exercises. °¨ Breathing training. °¨ Counseling. °¨ Learning techniques to conserve energy. °¨ Nutrition counseling. °· Keep all follow-up visits as told by your health care provider. This is important. °Contact a health care provider if: °· You become short of breath during activity or while resting. °· You develop a cough that does not go away. °· You have a fever. °· Your symptoms do not get better or they get worse. °· You become anxious or depressed. °Get help right away if: °· You have sudden shortness of breath. °· You develop sudden chest pain that does not go away. °· You develop a rapid heart rate. °· You develop swelling or pain in one of your legs. °· You cough up blood. °· You have trouble breathing. °· Your skin, lips, or fingernails turn blue. °These symptoms may represent a serious problem that is an emergency. Do not wait to see if the symptoms will go away. Get medical help right away. Call your local emergency services (911 in the [place] Do not drive yourself to the hospital. °Summary °· Acute respiratory distress syndrome is a life-threatening condition in which fluid collects in the lungs, which leads the lungs and other vital organs to fail. °· This condition usually develops following an infection, illness, surgery, or injury. °· Sudden  shortness of breath and rapid breathing are the main symptoms of acute respiratory distress syndrome. °· Treatment may include oxygen therapy, continuous positive airway pressure (CPAP), tracheostomy, lying on your stomach (prone position), medicines, fluids and nutrients given through an IV tube, compression stockings, and extra corporeal membrane oxygenation (ECMO). °This information is not intended to replace advice given to you by your health care provider. Make sure you discuss any questions you have with your health care provider. °Document Released: 11/05/2005 Document Revised: 10/22/2016 Document Reviewed: 10/22/2016 °[employer]vier Interactive Patient Education © 2017 [employer] ° °

## 2017-08-26 NOTE — Discharge Summary (Addendum)
Physician Discharge Summary  Travis Coleman ZOX:[id number] DOB: 1932/06/15 DOA: 08/17/2017  PCP: Florentina Jenny, MD  Admit date: 08/17/2017 Discharge date: 08/26/2017  Time spent: 45 minutes  Recommendations for Outpatient Follow-up:  Patient will be discharged to skilled nursing facility with palliative care to follow.  Patient will need to follow up with primary care provider within one week of discharge, repeat CBC and BMP.  Follow up with Dr. Algie Coffer, cardiology, in 1-2 weeks. Patient should continue medications as prescribed.  Patient should follow a Dysphagia 2 diet.   Discharge Diagnoses:  Acute on chronic hypoxic respiratory failure Sepsis secondary to Rhinovirus/Enterovirus/ possible pneumonia Acute diastolic heart failure NSTEMI  Dyslipidemia Diabetes mellitus, type II with nephropathy, complicated by hyperglycemia Acute kidney injury on chronic kidney disease, stage III Dementia Goals of care  Discharge Condition: stable  Diet recommendation: dysphagia 2  Filed Weights   08/17/17 2339 08/24/17 0605 08/25/17 0535  Weight: 95.7 kg (210 lb 15.7 oz) 93.5 kg (206 lb 2.1 oz) 92.4 kg (203 lb 11.3 oz)    History of present illness:  On 08/17/2017 by Dr. Thad Ranger Patient is a 83 year old male with history of CAD status post CABG, CHF, chronic kidney disease stage III, advanced dementia, hypertension, hyperlipidemia, PVD presented from [facility] skilled nursing facility for shortness of breath. History was obtained from the ER records and patient's sister at the bedside. Per patient's sister, patient had shortness of breath in the last 2-3 days at the facility and patient was placed on nebulizer treatments, Levaquin. He also received Lasix yesterday. She did not report any fevers. Today, EMS was called for acute respiratory distress and hypoxia, was placed on BiPAP in ED. Patient was noted to have diffuse rhonchi and wheezing. At baseline, patient is wheelchair bound, does not  ambulate. [name] mentioned that patient was coughing and appeared to be choking on food yesterday. At the time of my encounter, patient is on BiPAP, has advanced dementia and unable to provide any history.  Hospital Course:  Acute on chronic hypoxic respiratory failure -Likely secondary to acute bronchitis/viral source -Presented with acute history distress and wheezing -Recquired BiPAP initially -continue pulmicort, Brovana -VQ Scan negative -Currently on room air with oxygen saturations in high 90s -Will discharge with prednisone taper  Sepsis secondary to Rhinovirus/Enterovirus/ possible pneumonia -Present with fever, tachycardia, tachypnea -Respiratory viral panel positive for rhinovirus/enterovirus -influenza PCR negative -Urine strep pneumonia and legionella antigens negative -Blood cultures 1/2 coag-negative staph, likely contaminant -was placed vanc and zosyn, discontinued on 08/21/2017. Will continue to monitor closely -Increase in leukocytosis today, Repeated CXR today: improved B/L lower lobe density consistent with improving pneumonia, mild persistent density L > R -Repeat UA unremarkable for infection -Placed on cefepime for possible pneumonia -will discharge with cefednir  BID   Acute diastolic heart failure -Echocardiogram showed an EF of 60-65%, intermediate diastolic dysfunction -Lasix held due to worsening renal function -Cardiology consulted and appreciated -Monitor intake and output, daily weights -Continue PO lasix  daily (discussed with Dr. Algie Coffer) -Urine output over the past 24 hours 2650 mL   NSTEMI  -S/p CABG -Cardiology consulted and appreciated, continue medical treatment -Echocardiogram showed EF 60-65%, no wall motion abnormalities -Continue aspirin, statin -Was placed on IV heparin which was discontinued on 08/20/2017  Dyslipidemia -Continue statin  Diabetes mellitus, type II with nephropathy, complicated by  hyperglycemia -Continue Lantus, insulin sliding scale -restart Tradjenta  -Hyperglycemia likely secondary to steroid use  Acute kidney injury on chronic kidney disease, stage III -Baseline  creatinine from 1.3-1.4 -Advised admission, patient was given fluid due to sepsis however now chest x-ray did show volume overload -IV fluids were discontinued, patient was given IV Lasix however this is held due to worsening creatinine -Renal ultrasound: No obstruction, bilateral cortical atrophy, increased cortical echotexture consistent with medical renal disease -discontinued vancomycin and zosyn on 10/3 -Creatinine currently 1.65 -Continue to monitor BMP  Chronic normocytic anemia -hemoglobin currently 10, appears to be stable  Dementia -Continue Namenda, Aricept  Goals of care -Palliative care consulted and appreciated -Recommended patient have palliative care to follow as an outpatient -Patient has partial code, only BiPAP  Procedures: Echocardiogram Renal US VQ Scan  Consultations:  Cardiology Palliative care  Discharge Exam: Vitals:   08/26/17 0725 08/26/17 0748  BP: (!) 183/57   Pulse: 72   Resp: (!) 26   Temp: 97.9 F (36.6 C)   SpO2: 100% 100%    General: Well developed, well nourished, NAD, appears stated age  HEENT: NCAT, mucous membranes moist.  Cardiovascular: S1 S2 auscultated, 2/6 SEM, RRR  Respiratory: Diminished breath sounds   Abdomen: Soft, nontender, nondistended, + bowel sounds  Extremities: warm dry without cyanosis clubbing or edema  Neuro: AAOx3, nonfocal  Psych: Appropriate   Discharge Instructions  Current Discharge Medication List    CONTINUE these medications which have NOT CHANGED   Details  acetaminophen (TYLENOL) 650 MG CR tablet Take 650 mg by mouth every 6 (six) hours.    aspirin EC 81 MG tablet Take 81 mg by mouth daily.    atorvastatin (LIPITOR) 20 MG tablet Take 20 mg by mouth at bedtime.    cholecalciferol (VITAMIN  D) 1000 UNITS tablet Take 2,000 Units by mouth daily.    divalproex (DEPAKOTE SPRINKLE) 125 MG capsule Take 250 mg by mouth 3 (three) times daily.     enalapril (VASOTEC) 20 MG tablet Take 20 mg by mouth daily.    Eyelid Cleansers (OCUSOFT EYELID CLEANSING EX) Apply to bilateral eye lids topically one time daily for Bilateral Marginal Blepharitis    ferrous sulfate 325 (65 FE) MG tablet Take 325 mg by mouth daily with breakfast.    gabapentin (NEURONTIN) 100 MG capsule Take 200 mg by mouth at bedtime.    hydrALAZINE (APRESOLINE) 50 MG tablet Take 50 mg by mouth every 8 (eight) hours.    insulin aspart (NOVOLOG) 100 UNIT/ML injection Inject as per sliding scale: if  0-59=0 Call MD ; 60-69 = 0 units, 70-150= 10; 151-450 = 13 units; 450 + = 0 unitsCall MD for CBG < 60 or > 450, subcutaneously 3 times daily after meals related to DM.    insulin glargine (LANTUS) 100 UNIT/ML injection Inject 8 Units into the skin at bedtime.     ipratropium-albuterol (DUONEB) 0.5-2.5 (3) MG/3ML SOLN Take 3 mLs by nebulization every 6 (six) hours as needed.         linagliptin (TRADJENTA) 5 MG TABS tablet Take 5 mg by mouth daily.    Memantine HCl-Donepezil HCl (NAMZARIC) 28-10 MG CP24 Take 1 capsule by mouth at bedtime.     saccharomyces boulardii (FLORASTOR) 250 MG capsule Take 250 mg by mouth 2 (two) times daily.    sulfacetamide (BLEPH-10) 10 % ophthalmic solution Place 1 drop into both eyes 4 (four) times daily.       No Known Allergies Follow-up Information    Florentina Jenny, MD. Schedule an appointment as soon as possible for a visit in 1 week(s).   Specialty:  Family Medicine Why:  Hospital follow up Contact information: [address] [place] [address] [phone]            The results of significant diagnostics from this hospitalization (including imaging, microbiology, ancillary and laboratory) are listed below for reference.    Significant Diagnostic  Studies: US Renal  Result Date: 08/19/2017 CLINICAL DATA:  Acute renal injury. History of chronic renal insufficiency, diabetes, and hypertension. EXAM: RENAL / URINARY TRACT ULTRASOUND COMPLETE COMPARISON:  Noncontrast abdominopelvic CT scan dated November 03, 2015 FINDINGS: Right Kidney: Length: 9.8 cm. The renal cortical echotexture is increased. There is diffuse cortical thinning. There is no focal mass nor hydronephrosis. Left Kidney: Length: 10.7 cm. The cortical echotexture is increased similar to that on the right. There is mild cortical thinning diffusely. There is no focal mass or hydronephrosis. Bladder: A Foley catheter is present in the partially distended urinary bladder. IMPRESSION: Bilateral cortical atrophy and increased cortical echotexture consistent with medical renal disease. No hydronephrosis. Electronically Signed   By: David  [place] M.D.   On: 08/19/2017 11:32   Nm Pulmonary Perf And Vent  Result Date: 08/18/2017 CLINICAL DATA:  83 year old male with acute shortness of breath for 2-3 days. EXAM: NUCLEAR MEDICINE VENTILATION - PERFUSION LUNG SCAN TECHNIQUE: Ventilation images were obtained in multiple projections using inhaled aerosol Tc-68m DTPA. Perfusion images were obtained in multiple projections after intravenous injection of Tc-27m MAA. RADIOPHARMACEUTICALS:  31.7 mCi Technetium-4m DTPA aerosol inhalation and 4.2 mCi Technetium-73m MAA IV COMPARISON:  08/17/2017 chest radiograph FINDINGS: Ventilation: No focal ventilation defect. Central clumping is present. Perfusion: No wedge shaped peripheral perfusion defects to suggest acute pulmonary embolism. IMPRESSION: No evidence of pulmonary embolism.  No VQ mismatches. Electronically Signed   By: Harmon Pier M.D.   On: 08/18/2017 10:27   Dg Chest Port 1 View  Result Date: 08/23/2017 CLINICAL DATA:  Followup pneumonia EXAM: PORTABLE CHEST 1 VIEW COMPARISON:  08/18/2017 FINDINGS: Previous median sternotomy and CABG. Bilateral lower  lobe pneumonia, left more than right, is improved with slightly less consolidation particularly in the left lower lobe. No worsening or new finding. No effusion. No acute bone finding. IMPRESSION: Improved bilateral lower lobe density consistent with improving pneumonia. Mild persistent density left more than right. Electronically Signed   By: Paulina Fusi M.D.   On: 08/23/2017 08:00   Dg Chest Port 1 View  Result Date: 08/18/2017 CLINICAL DATA:  83 year old male with shortness of breath. EXAM: PORTABLE CHEST 1 VIEW COMPARISON:  08/17/2017 and prior exams FINDINGS: Cardiomegaly and CABG changes again noted. Pulmonary vascular congestion noted with possible mild interstitial edema. No airspace disease, pleural effusion or pneumothorax. IMPRESSION: Cardiomegaly with pulmonary vascular congestion and possible mild interstitial edema. Electronically Signed   By: Harmon Pier M.D.   On: 08/18/2017 11:07   Dg Chest Portable 1 View  Result Date: 08/17/2017 CLINICAL DATA:  SOB today. Pt here from nursing home. On BiPAP at time of imaging. Hx of HTN, CHF, DM, CAD, CKD. EXAM: PORTABLE CHEST 1 VIEW COMPARISON:  11/03/2015 FINDINGS: Status post median sternotomy and CABG. The heart is enlarged. There are no focal consolidations or pleural effusions. No pulmonary edema. IMPRESSION: Stable cardiomegaly. Electronically Signed   By: Norva Pavlov M.D.   On: 08/17/2017 15:25   Dg Swallowing Func-speech Pathology  Result Date: 08/19/2017 Objective Swallowing Evaluation: Type of Study: MBS-Modified Barium Swallow Study Patient Details Name: AVIGDOR DOLLAR MRN: [id number] Date of Birth: [birth year] Today's Date: 08/19/2017 Time: SLP Start Time (ACUTE ONLY):  1002-SLP Stop Time (ACUTE ONLY): 1017 SLP Time Calculation (min) (ACUTE ONLY): 15 min Past Medical History: Past Medical History: Diagnosis Date . Anemia  . Bradycardia  . CAD (coronary artery disease) of bypass graft   Multivessel . Cataract   bilateral . CHF  (congestive heart failure) (HCC)  . Chronic kidney disease   Stage 3 . Dementia   MMSE 18/30 Feb 2012 [facility]), with behavioral disturbances . Diabetes mellitus  . Dysphagia  . Hearing deficit  . Hyperlipidemia  . Hypertension  . Neuropathy  . PVD (peripheral vascular disease) (HCC)  . Thrombocytopenia (HCC)  Past Surgical History: Past Surgical History: Procedure Laterality Date . CORONARY ARTERY BYPASS GRAFT   . PERIPHERAL VASCULAR CATHETERIZATION N/A 11/28/2016  Procedure: Abdominal Aortogram w/Lower Extremity;  Surgeon: Maeola Harman, MD;  Location: [facility] INVASIVE CV LAB;  Service: Cardiovascular;  Laterality: N/A; HPI: 83 year old male who is HOH with history of CAD status post CABG, CHF, chronic kidney disease stage III, advanced dementia, hypertension, hyperlipidemia, PVD presented from [facility] skilled nursing facility for shortness of breath.  Dx Acute on chronic resp failure with clinical pna, sepsis, NSTEMI. Objective test recommended following meal observation.    Subjective: alert, confused, asking to help him get out of here Assessment / Plan / Recommendation CHL IP CLINICAL IMPRESSIONS 08/19/2017 Clinical Impression Mild oral impairments marked by decreased cohesion, delayed bolus formation and lingual residue spilling over tongue. Laryngeal penetration to vocal cords with thin due to decreased timing and coordination of swallow which remained on anterior vestibule slightly without awareness. Bolus in pyriform sinuses before initiation of hyoid elevation and excursion. Mild-moderate vallecular residue reduced to mild given cues for second swallow. Appeared to have intermittent tightness of UES. Continue Dys 2, nectar thick liquids, crush meds, full assist for safety and feeding assist and ST.    SLP Visit Diagnosis Dysphagia, oropharyngeal phase (R13.12) Attention and concentration deficit following -- Frontal lobe and executive function deficit following -- Impact on safety and function  Moderate aspiration risk   CHL IP TREATMENT RECOMMENDATION 08/19/2017 Treatment Recommendations Therapy as outlined in treatment plan below   Prognosis 08/19/2017 Prognosis for Safe Diet Advancement Fair Barriers to Reach Goals -- Barriers/Prognosis Comment -- CHL IP DIET RECOMMENDATION 08/19/2017 SLP Diet Recommendations Dysphagia 2 (Fine chop) solids;Nectar thick liquid Liquid Administration via No straw;Cup Medication Administration Crushed with puree Compensations Minimize environmental distractions;Small sips/bites;Slow rate;Lingual sweep for clearance of pocketing Postural Changes Seated upright at 90 degrees   CHL IP OTHER RECOMMENDATIONS 08/19/2017 Recommended Consults -- Oral Care Recommendations Oral care BID Other Recommendations --   CHL IP FOLLOW UP RECOMMENDATIONS 08/19/2017 Follow up Recommendations Skilled Nursing facility   [facility] IP FREQUENCY AND DURATION 08/19/2017 Speech Therapy Frequency (ACUTE ONLY) min 2x/week Treatment Duration 2 weeks      CHL IP ORAL PHASE 08/19/2017 Oral Phase Impaired Oral - Pudding Teaspoon -- Oral - Pudding Cup -- Oral - Honey Teaspoon -- Oral - Honey Cup -- Oral - Nectar Teaspoon -- Oral - Nectar Cup Delayed oral transit;Decreased bolus cohesion Oral - Nectar Straw -- Oral - Thin Teaspoon -- Oral - Thin Cup Decreased bolus cohesion Oral - Thin Straw -- Oral - Puree Decreased bolus cohesion;Delayed oral transit Oral - Mech Soft -- Oral - Regular -- Oral - Multi-Consistency -- Oral - Pill -- Oral Phase - Comment --  CHL IP PHARYNGEAL PHASE 08/19/2017 Pharyngeal Phase Impaired Pharyngeal- Pudding Teaspoon -- Pharyngeal -- Pharyngeal- Pudding Cup -- Pharyngeal -- Pharyngeal- Honey Teaspoon -- Pharyngeal --  Pharyngeal- Honey Cup NT Pharyngeal -- Pharyngeal- Nectar Teaspoon -- Pharyngeal -- Pharyngeal- Nectar Cup Pharyngeal residue - valleculae;Reduced epiglottic inversion Pharyngeal -- Pharyngeal- Nectar Straw -- Pharyngeal -- Pharyngeal- Thin Teaspoon -- Pharyngeal -- Pharyngeal-  Thin Cup Delayed swallow initiation-pyriform sinuses;Penetration/Aspiration during swallow Pharyngeal Material enters airway, CONTACTS cords and not ejected out Pharyngeal- Thin Straw -- Pharyngeal -- Pharyngeal- Puree -- Pharyngeal -- Pharyngeal- Mechanical Soft -- Pharyngeal -- Pharyngeal- Regular -- Pharyngeal -- Pharyngeal- Multi-consistency -- Pharyngeal -- Pharyngeal- Pill -- Pharyngeal -- Pharyngeal Comment --  CHL IP CERVICAL ESOPHAGEAL PHASE 08/19/2017 Cervical Esophageal Phase Impaired Pudding Teaspoon -- Pudding Cup -- Honey Teaspoon -- Honey Cup -- Nectar Teaspoon -- Nectar Cup -- Nectar Straw -- Thin Teaspoon -- Thin Cup -- Thin Straw -- Puree -- Mechanical Soft -- Regular -- Multi-consistency -- Pill -- Cervical Esophageal Comment -- CHL IP GO 12/06/2013 Functional Assessment Tool Used skilled clinical judgememt Functional Limitations Swallowing Swallow Current Status (I3474) CI Swallow Goal Status (Q5956) CI Swallow Discharge Status (L8756) CI Motor Speech Current Status (E3329) (None) Motor Speech Goal Status (J1884) (None) Motor Speech Goal Status (Z6606) (None) Spoken Language Comprehension Current Status (T0160) (None) Spoken Language Comprehension Goal Status (F0932) (None) Spoken Language Comprehension Discharge Status (T5573) (None) Spoken Language Expression Current Status (U2025) (None) Spoken Language Expression Goal Status (K2706) (None) Spoken Language Expression Discharge Status [phone]) (None) Attention Current Status (G3151) (None) Attention Goal Status (V6160) (None) Attention Discharge Status (V3710) (None) Memory Current Status (G2694) (None) Memory Goal Status (W5462) (None) Memory Discharge Status (V0350) (None) Voice Current Status (K9381) (None) Voice Goal Status (W2993) (None) Voice Discharge Status (Z1696) (None) Other Speech-Language Pathology Functional Limitation Current Status (V8938) (None) Other Speech-Language Pathology Functional Limitation Goal Status (B0175) (None) Other  Speech-Language Pathology Functional Limitation Discharge Status [phone]) (None) Royce Macadamia 08/19/2017, 11:45 AM  Breck Coons [employer] [employer] [phone]              Microbiology: Recent Results (from the past 240 hour(s))  Blood Culture (routine x 2)     Status: Abnormal   Collection Time: 08/17/17  2:45 PM  Result Value Ref Range Status   Specimen Description BLOOD RIGHT HAND  Final   Special Requests   Final    BOTTLES DRAWN AEROBIC AND ANAEROBIC Blood Culture adequate volume   Culture  Setup Time   Final    GRAM POSITIVE COCCI IN CLUSTERS IN BOTH AEROBIC AND ANAEROBIC BOTTLES CRITICAL RESULT CALLED TO, READ BACK BY AND VERIFIED WITH: M MACCIA,PHARMD AT 1113 08/18/17 BY L BENFIELD    Culture (A)  Final    STAPHYLOCOCCUS SPECIES (COAGULASE NEGATIVE) THE SIGNIFICANCE OF ISOLATING THIS ORGANISM FROM A SINGLE SET OF BLOOD CULTURES WHEN MULTIPLE SETS ARE DRAWN IS UNCERTAIN. PLEASE NOTIFY THE MICROBIOLOGY DEPARTMENT WITHIN ONE WEEK IF SPECIATION AND SENSITIVITIES ARE REQUIRED.    Report Status 08/20/2017 FINAL  Final  Blood Culture ID Panel (Reflexed)     Status: Abnormal   Collection Time: 08/17/17  2:45 PM  Result Value Ref Range Status   Enterococcus species NOT DETECTED NOT DETECTED Final   Listeria monocytogenes NOT DETECTED NOT DETECTED Final   Staphylococcus species DETECTED (A) NOT DETECTED Final    Comment: Methicillin (oxacillin) resistant coagulase negative staphylococcus. Possible blood culture contaminant (unless isolated from more than one blood culture draw or clinical case suggests pathogenicity). No antibiotic treatment is indicated for blood  culture contaminants. CRITICAL RESULT CALLED TO, READ BACK BY AND VERIFIED WITH: M MACCIA,PHARMD AT 1113  08/18/17 BY L BENFIELD    Staphylococcus aureus NOT DETECTED NOT DETECTED Final   Methicillin resistance DETECTED (A) NOT DETECTED Final    Comment: CRITICAL RESULT CALLED TO, READ BACK BY AND VERIFIED WITH: M  MACCIA,PHARMD AT 1113 08/18/17 BY L BENFIELD    Streptococcus species NOT DETECTED NOT DETECTED Final   Streptococcus agalactiae NOT DETECTED NOT DETECTED Final   Streptococcus pneumoniae NOT DETECTED NOT DETECTED Final   Streptococcus pyogenes NOT DETECTED NOT DETECTED Final   Acinetobacter baumannii NOT DETECTED NOT DETECTED Final   Enterobacteriaceae species NOT DETECTED NOT DETECTED Final   Enterobacter cloacae complex NOT DETECTED NOT DETECTED Final   Escherichia coli NOT DETECTED NOT DETECTED Final   Klebsiella oxytoca NOT DETECTED NOT DETECTED Final   Klebsiella pneumoniae NOT DETECTED NOT DETECTED Final   Proteus species NOT DETECTED NOT DETECTED Final   Serratia marcescens NOT DETECTED NOT DETECTED Final   Haemophilus influenzae NOT DETECTED NOT DETECTED Final   Neisseria meningitidis NOT DETECTED NOT DETECTED Final   Pseudomonas aeruginosa NOT DETECTED NOT DETECTED Final   Candida albicans NOT DETECTED NOT DETECTED Final   Candida glabrata NOT DETECTED NOT DETECTED Final   Candida krusei NOT DETECTED NOT DETECTED Final   Candida parapsilosis NOT DETECTED NOT DETECTED Final   Candida tropicalis NOT DETECTED NOT DETECTED Final  Blood Culture (routine x 2)     Status: None   Collection Time: 08/17/17  3:39 PM  Result Value Ref Range Status   Specimen Description BLOOD RIGHT HAND  Final   Special Requests   Final    BOTTLES DRAWN AEROBIC AND ANAEROBIC Blood Culture adequate volume   Culture NO GROWTH 5 DAYS  Final   Report Status 08/22/2017 FINAL  Final  Respiratory Panel by PCR     Status: Abnormal   Collection Time: 08/17/17  3:52 PM  Result Value Ref Range Status   Adenovirus NOT DETECTED NOT DETECTED Final   Coronavirus 229E NOT DETECTED NOT DETECTED Final   Coronavirus HKU1 NOT DETECTED NOT DETECTED Final   Coronavirus NL63 NOT DETECTED NOT DETECTED Final   Coronavirus OC43 NOT DETECTED NOT DETECTED Final   Metapneumovirus NOT DETECTED NOT DETECTED Final   Rhinovirus  / Enterovirus DETECTED (A) NOT DETECTED Final   Influenza A NOT DETECTED NOT DETECTED Final   Influenza B NOT DETECTED NOT DETECTED Final   Parainfluenza Virus 1 NOT DETECTED NOT DETECTED Final   Parainfluenza Virus 2 NOT DETECTED NOT DETECTED Final   Parainfluenza Virus 3 NOT DETECTED NOT DETECTED Final   Parainfluenza Virus 4 NOT DETECTED NOT DETECTED Final   Respiratory Syncytial Virus NOT DETECTED NOT DETECTED Final   Bordetella pertussis NOT DETECTED NOT DETECTED Final   Chlamydophila pneumoniae NOT DETECTED NOT DETECTED Final   Mycoplasma pneumoniae NOT DETECTED NOT DETECTED Final  Urine culture     Status: None   Collection Time: 08/17/17  3:54 PM  Result Value Ref Range Status   Specimen Description URINE, CATHETERIZED  Final   Special Requests NONE  Final   Culture NO GROWTH  Final   Report Status 08/18/2017 FINAL  Final  MRSA PCR Screening     Status: None   Collection Time: 08/17/17  8:05 PM  Result Value Ref Range Status   MRSA by PCR NEGATIVE NEGATIVE Final    Comment:        The GeneXpert MRSA Assay (FDA approved for NASAL specimens only), is one component of a comprehensive MRSA colonization surveillance program. It is  not intended to diagnose MRSA infection nor to guide or monitor treatment for MRSA infections.   Urine culture     Status: None   Collection Time: 08/18/17 10:17 AM  Result Value Ref Range Status   Specimen Description URINE, CATHETERIZED  Final   Special Requests NONE  Final   Culture NO GROWTH  Final   Report Status 08/19/2017 FINAL  Final     Labs: Basic Metabolic Panel:  Recent Labs Lab 08/21/17 0232 08/22/17 0237 08/22/17 1043 08/23/17 0306 08/24/17 0159 08/25/17 0250  NA 140 142  --  146* 142 142  K 4.3 4.2  --  3.8 4.4 4.9  CL 108 111  --  113* 109 108  CO2 18* 20*  --  GLUCOSE 342* 371*  --  214* 333* 282*  BUN 91* 79*  --  61* 51* 51*  CREATININE 2.80* 2.28*  --  1.72* 1.72* 1.72*  CALCIUM 7.1* 7.3*  --  7.7*  7.8* 8.0*  MG  --   --  2.1  --   --   --    Liver Function Tests: No results for input(s): AST, ALT, ALKPHOS, BILITOT, PROT, ALBUMIN in the last 168 hours. No results for input(s): LIPASE, AMYLASE in the last 168 hours. No results for input(s): AMMONIA in the last 168 hours. CBC:  Recent Labs Lab 08/21/17 0232 08/22/17 0237 08/23/17 0306 08/24/17 0159 08/25/17 0250  WBC 9.9 10.3 13.0* 12.6* 13.9*  HGB 8.5* 8.5* 8.9* 9.0* 9.5*  HCT 25.9* 26.6* 28.1* 28.2* 29.0*  MCV 95.6 95.0 95.9 97.2 96.0  PLT 153 174 194 210 195   Cardiac Enzymes: No results for input(s): CKTOTAL, CKMB, CKMBINDEX, TROPONINI in the last 168 hours. BNP: BNP (last 3 results)  Recent Labs  08/17/17 1539 08/18/17 1152  BNP 314.7* 277.9*    ProBNP (last 3 results) No results for input(s): PROBNP in the last 8760 hours.  CBG:  Recent Labs Lab 08/25/17 0841 08/25/17 1127 08/25/17 1608 08/25/17 2106 08/26/17 0758  GLUCAP 225* 216* 298* 254* 222*       Signed:  Edsel Petrin  [facility] 08/26/2017, 10:17 AM

## 2017-08-27 LAB — CBC
HEMATOCRIT: 30.9 % — AB (ref 39.0–52.0)
HEMOGLOBIN: 9.9 g/dL — AB (ref 13.0–17.0)
MCH: 30.7 pg (ref 26.0–34.0)
MCHC: 32 g/dL (ref 30.0–36.0)
MCV: 96 fL (ref 78.0–100.0)
PLATELETS: 174 10*3/uL (ref 150–400)
RBC: 3.22 MIL/uL — ABNORMAL LOW (ref 4.22–5.81)
RDW: 14.4 % (ref 11.5–15.5)
WBC: 11.9 10*3/uL — AB (ref 4.0–10.5)

## 2017-08-27 LAB — GLUCOSE, CAPILLARY: Glucose-Capillary: 115 mg/dL — ABNORMAL HIGH (ref 65–99)

## 2017-08-27 NOTE — Clinical Social Work Note (Signed)
CSW facilitated patient discharge including contacting patient family and facility to confirm patient discharge plans. Clinical information faxed to facility and family agreeable with plan. CSW arranged ambulance transport via [facility] to [facility]. RN to call report prior to discharge [phone]). RN to call patient's sister when transport arrives as well.  CSW will sign off for now as social work intervention is no longer needed. Please consult [place] again if new needs arise.  Charlynn Court, CSW [phone]

## 2017-08-27 NOTE — Progress Notes (Signed)
  Speech Language Pathology Treatment: Dysphagia  Patient Details Name: Travis Coleman MRN: [id number] DOB: 30-Sep-1932 Today's Date: 08/27/2017 Time: 1478-2956 SLP Time Calculation (min) (ACUTE ONLY): 12 min  Assessment / Plan / Recommendation Clinical Impression  Pt scheduled for discharge to SNF today and has not been ready for repeat MBS (MBS 10/1). Initial ST involvement, pt demonstrated significant audible chest congestion/wheezing and wet vocal quality which has significantly decreased over the course of 10 days. Delayed cough x 1 throughout session, slight wheeze. Uncertain of pt's baseline diet texture and state of dentition (does he have dentures at home or consume all textures without? No family has been present during ST). Attempted Dys 3 texture (small piece graham cracker) for diagnostic treatment. Prolonged mastication with min-mild lingual residue. Min verbal reminders given to take small sips all spoken to his left ("better) ear. Continue ST at SNF and repeat MBS in future IF improvement seen in overall strength and respiratory status for possible liquid upgrade. Pt had tight UES noted during MBS and suspect had baseline underlying dysphagia (?).   HPI HPI: 83 year old male who is HOH with history of CAD status post CABG, CHF, chronic kidney disease stage III, advanced dementia, hypertension, hyperlipidemia, PVD presented from [facility] skilled nursing facility for shortness of breath.  Dx Acute on chronic resp failure with clinical pna, sepsis, NSTEMI. Objective test recommended following meal observation.   10/5 CXR: Improved bilateral lower lobe density consistent with improving pneumonia. Mild persistent density left more than right.      SLP Plan  Continue with current plan of care       Recommendations  Diet recommendations: Dysphagia 2 (fine chop);Nectar-thick liquid Liquids provided via: Cup;No straw Medication Administration: Crushed with puree Supervision: Patient  able to self feed;Staff to assist with self feeding;Full supervision/cueing for compensatory strategies Compensations: Minimize environmental distractions;Slow rate;Small sips/bites Postural Changes and/or Swallow Maneuvers: Seated upright 90 degrees                Oral Care Recommendations: Oral care BID Follow up Recommendations: Skilled Nursing facility SLP Visit Diagnosis: Dysphagia, oropharyngeal phase (R13.12) Plan: Continue with current plan of care                       Travis Coleman 08/27/2017, 10:12 AM  Travis Coleman.Ed [employer] [phone]

## 2017-08-27 NOTE — Progress Notes (Signed)
Report called to [employer]. RN receiving report shared that she was familiar with patient and had worked with him previously. All questioned were answered and patient will be ready for transport when non-emergent ambulance arrives.

## 2017-08-27 NOTE — Progress Notes (Signed)
Patient was to be discharged on 08/26/2017 to SNF. See full discharge summary done on 08/26/2017. No changes. Patient stable for discharge.  Time spent: 10 minutes  Macall Mccroskey D.O. [facility] Pager [phone]  If 7PM-7AM, please contact night-coverage [email or web address] Password [email or web address] 08/27/2017, 8:03 AM

## 2017-08-28 ENCOUNTER — Non-Acute Institutional Stay (SKILLED_NURSING_FACILITY): Payer: Medicare Other | Admitting: Adult Health

## 2017-08-28 ENCOUNTER — Encounter: Payer: Self-pay | Admitting: Adult Health

## 2017-08-28 DIAGNOSIS — N183 Chronic kidney disease, stage 3 unspecified: Secondary | ICD-10-CM

## 2017-08-28 DIAGNOSIS — E1165 Type 2 diabetes mellitus with hyperglycemia: Secondary | ICD-10-CM

## 2017-08-28 DIAGNOSIS — I5022 Chronic systolic (congestive) heart failure: Secondary | ICD-10-CM

## 2017-08-28 DIAGNOSIS — I214 Non-ST elevation (NSTEMI) myocardial infarction: Secondary | ICD-10-CM | POA: Diagnosis not present

## 2017-08-28 DIAGNOSIS — F0150 Vascular dementia without behavioral disturbance: Secondary | ICD-10-CM | POA: Diagnosis not present

## 2017-08-28 DIAGNOSIS — I110 Hypertensive heart disease with heart failure: Secondary | ICD-10-CM | POA: Diagnosis not present

## 2017-08-28 DIAGNOSIS — J189 Pneumonia, unspecified organism: Secondary | ICD-10-CM | POA: Diagnosis not present

## 2017-08-28 DIAGNOSIS — E1121 Type 2 diabetes mellitus with diabetic nephropathy: Secondary | ICD-10-CM

## 2017-08-28 DIAGNOSIS — D631 Anemia in chronic kidney disease: Secondary | ICD-10-CM

## 2017-08-28 DIAGNOSIS — IMO0002 Reserved for concepts with insufficient information to code with codable children: Secondary | ICD-10-CM

## 2017-08-28 DIAGNOSIS — I739 Peripheral vascular disease, unspecified: Secondary | ICD-10-CM

## 2017-08-28 DIAGNOSIS — E1169 Type 2 diabetes mellitus with other specified complication: Secondary | ICD-10-CM | POA: Diagnosis not present

## 2017-08-28 DIAGNOSIS — E785 Hyperlipidemia, unspecified: Secondary | ICD-10-CM | POA: Diagnosis not present

## 2017-08-28 DIAGNOSIS — J9621 Acute and chronic respiratory failure with hypoxia: Secondary | ICD-10-CM

## 2017-08-28 DIAGNOSIS — I25709 Atherosclerosis of coronary artery bypass graft(s), unspecified, with unspecified angina pectoris: Secondary | ICD-10-CM

## 2017-08-28 NOTE — Progress Notes (Signed)
Location:   [facility] Room Number: 226 A Place of Service:  SNF (31)   CODE STATUS: Full Code  No Known Allergies  Chief Complaint  Patient presents with  . Hospitalization Follow-up    HPI:  He is an 83 year old long term resident of this facility being seen after his recent hospitalization. He was being treated for pneumonia and was taken to the hospital for acute on chronic respiratory failure sepsis secondary to rhinovirus/enterovirus/pneumonia; acute diastolic heart failure, NSTEMI. Blood cultures showed likely contaminate. For his nstemi was treated with medical management. For his sepsis will continue omnicef. His vq scan did not demonstrate PE. He is unable to participate in the hpi or ros. There are no reports of fever present; his appetite is poor; and he continues to have rhonchi present.   Past Medical History:  Diagnosis Date  . Anemia   . Bradycardia   . CAD (coronary artery disease) of bypass graft    Multivessel  . Cataract    bilateral  . CHF (congestive heart failure) (HCC)   . Chronic kidney disease    Stage 3  . Dementia    MMSE 18/30 Feb 2012 [facility]), with behavioral disturbances  . Diabetes mellitus   . Dysphagia   . Hearing deficit   . Hyperlipidemia   . Hypertension   . Neuropathy   . PVD (peripheral vascular disease) (HCC)   . Thrombocytopenia (HCC)     Past Surgical History:  Procedure Laterality Date  . CORONARY ARTERY BYPASS GRAFT    . PERIPHERAL VASCULAR CATHETERIZATION N/A 11/28/2016   Procedure: Abdominal Aortogram w/Lower Extremity;  Surgeon: Maeola Harman, MD;  Location: [facility] INVASIVE CV LAB;  Service: Cardiovascular;  Laterality: N/A;    Social History   Social History  . Marital status: Single    Spouse name: N/A  . Number of children: N/A  . Years of education: N/A   Occupational History  . Not on file.   Social History Main Topics  . Smoking status: Former Smoker    Quit date:  11/19/1948  . Smokeless tobacco: Never Used  . Alcohol use No  . Drug use: No  . Sexual activity: Not Currently   Other Topics Concern  . Not on file   Social History Narrative  . No narrative on file   Family History  Problem Relation Age of Onset  . Diabetes Mother   . Dementia Mother        alzheimer  . Dementia Father   . Stroke Father   . Heart disease Father   . Dementia Sister       VITAL SIGNS BP (!) 162/78   Pulse 99   Temp 99 F (37.2 C)   Resp 20   Ht  (1.727 m)   Wt 203 lb (92.1 kg)   SpO2 95%   BMI 30.87 kg/m   Patient's Medications  New Prescriptions   No medications on file  Previous Medications   ACETAMINOPHEN (TYLENOL) 650 MG CR TABLET    Take 650 mg by mouth every 6 (six) hours.   AMLODIPINE (NORVASC) 2.5 MG TABLET    Take 1 tablet (2.5 mg total) by mouth daily.   ARFORMOTEROL (BROVANA) 15 MCG/2ML NEBU    Take 2 mLs (15 mcg total) by nebulization 2 (two) times daily.   ASPIRIN EC 81 MG TABLET    Take 81 mg by mouth daily.   ATORVASTATIN (LIPITOR) 20 MG TABLET  Take 20 mg by mouth at bedtime.   BUDESONIDE (PULMICORT) 0.25 MG/2ML NEBULIZER SOLUTION    Take 2 mLs (0.25 mg total) by nebulization 2 (two) times daily.   CEFDINIR (OMNICEF) 300 MG CAPSULE    Take 300 mg by mouth 2 (two) times daily. For acute and chronic respiratory failure with hypoxia.   CHOLECALCIFEROL (VITAMIN D) 1000 UNITS TABLET    Take 2,000 Units by mouth daily.   DIVALPROEX (DEPAKOTE SPRINKLE) 125 MG CAPSULE    Take 250 mg by mouth 3 (three) times daily.    EYELID CLEANSERS (OCUSOFT EYELID CLEANSING EX)    Apply to bilateral eye lids topically one time daily for Bilateral Marginal Blepharitis   FERROUS SULFATE 325 (65 FE) MG TABLET    Take 325 mg by mouth daily with breakfast.   GABAPENTIN (NEURONTIN) 100 MG CAPSULE    Take 200 mg by mouth at bedtime.   HYDRALAZINE (APRESOLINE) 50 MG TABLET    Take 50 mg by mouth every 8 (eight) hours.   INSULIN ASPART (NOVOLOG) 100  UNIT/ML INJECTION    Inject as per sliding scale: if  0-59=0 Call MD ; 60-69 = 0 units, 70-150= 10; 151-450 = 13 units; 450 + = 0 unitsCall MD for CBG < 60 or > 450, subcutaneously 3 times daily after meals related to DM.   INSULIN GLARGINE (LANTUS) 100 UNIT/ML INJECTION    Inject 0.08 mLs (8 Units total) into the skin at bedtime.   IPRATROPIUM-ALBUTEROL (DUONEB) 0.5-2.5 (3) MG/3ML SOLN    Take 3 mLs by nebulization every 6 (six) hours as needed.   LINAGLIPTIN (TRADJENTA) 5 MG TABS TABLET    Take 5 mg by mouth daily.   MEMANTINE HCL-DONEPEZIL HCL (NAMZARIC) 28-10 MG CP24    Take 1 capsule by mouth at bedtime.    SACCHAROMYCES BOULARDII (FLORASTOR) 250 MG CAPSULE    Take 250 mg by mouth 2 (two) times daily.   SULFACETAMIDE (BLEPH-10) 10 % OPHTHALMIC SOLUTION    Place 1 drop into both eyes 4 (four) times daily.  Modified Medications   No medications on file  Discontinued Medications   FUROSEMIDE (LASIX) 80 MG TABLET    Take 1 tablet (80 mg total) by mouth daily.   PREDNISONE (DELTASONE) 10 MG TABLET    Take 2 pill daily for 2 days, followed by 1 pill daily for 3 days.     SIGNIFICANT DIAGNOSTIC EXAMS   PREVIOUS   11-03-15: chest x-ray; Shallow inspiration with atelectasis in the lung bases. Cardiac enlargement. No evidence of active pulmonary disease.   11-03-15: ct of abdomen and pelvis: 1. No definite acute abnormality seen within the abdomen or pelvis. 2. Trace fluid at the right lower quadrant is nonspecific. 3. Diffuse calcification along the abdominal aorta and its branches, including along the superior mesenteric artery, inferior mesenteric artery and at the proximal renal arteries bilaterally. 4. Mild bibasilar atelectasis noted. 5. Diffuse coronary artery calcifications seen. 6. Mild bilateral renal atrophy noted.  08-15-17: chest x-ray: mild chf  TODAY:   08-17-17: chest x-ray: Stable cardiomegaly.  08-18-17: 2-d echo: Impressions:  Normal LV size with moderate to severe LV  hypertrophy. EF 60-65%.  Normal RV size and systolic function. No significant valvular abnormalities.  08-18-17: VQ scan: No evidence of pulmonary embolism. No VQ mismatches.  08-18-17: chest x-ray: Cardiomegaly with pulmonary vascular congestion and possible mild interstitial edema.   08-18-17: chest x-ray: Cardiomegaly with pulmonary vascular congestion and possible mild interstitial edema.  08-19-17: swallow study:  nectar thick liquids,  08-19-17: renal ultrasound: Bilateral cortical atrophy and increased cortical echotexture consistent with medical renal disease. No hydronephrosis.  08-23-17: chest x-ray: Improved bilateral lower lobe density consistent with improving pneumonia. Mild persistent density left more than right.  LABS REVIEWED:PREVIOUS:    08-29-16: wbc 5.1; hgb 10.7; hct 34.2; mcv 92.6; plt 118  glucose 151; bun 51.6; creat 1.28; k+ 4.6; na++ 143  09-26-16: wbc 6.2; hgb 11.2; hct 36.6; mcv 99.0; plt 110; glucose 155; bun 29.0; creat 1.25; k+ 5.0; na++ 144; liver normal albumin 3.5 hgb a1c 7.8  01-25-17: chol 145; ldl 92; trig 199; hdl 27  hgb a1c 7.3 04-24-17: wbc 6.3; hgb 11.4; hct 36.2; mcv 97.2; plt 107; glucose 243; bun 35.1; creat 1.40; k+ 4.6; na++ 145; ca 8.0; liver normal albumin 3.1; ammonia 48  05-22-17: urine micro-albumin: >51.3 07-01-17: hgb a1c 7.9; depakote 15 urine micro-albumin 88   TODAY:   08-17-17: wbc 5.4; hgb 10.2; hct 32.6; mcv 97.9; plt 130; glucose 293; bun 48; creat 2.37; k+ 4.8; na++ 138; ca 7.9; BNP 314.7; urine culture: no growth; HIV; nr; hgb a1c 7.2 08-19-17: wbc 6.6; hgb 9.0; hct 27.5; mcv 96.5; plt 129; glucose 365; bun 64; creat 2.44; k+ 4.3; na++ 140; ca 7.1 08-21-17: wbc 9.9; hgb 8.5; hct 25.9; mcv 95.9; plt 153; glucose 342; bun 91; creat 2.80; k+ 4.3; na++ 140; ca 7.1 08-23-17: wbc 13.0; hgb 8.9; hct 28.1; mcv 95.9; plt 194; glucose 214; bun 61; creat 1.72; k+ 3.8; na++ 146; ca 7.7 08-25-17: wbc 13.9; hgb 9.5; hct 29.0; mcv 96.0; plt 195; glucose 282;  bun 51; creat 1.72; k+ 4.9; na++ 142; ca 8.0 08-26-17: wbc 13.7; hgb 10.0; hct 32.4; mcv 97; plt 185; glucose 336; bun 51; creat 1.65; k+ 3.9; na++ 139; ca 8.0 08-27-17; wbc 11.9; hgb 9.9; hct 30.9; mcv 96; plt 174     Review of Systems  Unable to perform ROS: Dementia (Confused )    Physical Exam  Constitutional: No distress.  Frail   Neck: Neck supple. No thyromegaly present.  Cardiovascular: Normal rate, regular rhythm, normal heart sounds and intact distal pulses.   Pulmonary/Chest: No respiratory distress.  Increased effort Rales and rhonchi present   Abdominal: Soft. Bowel sounds are normal. He exhibits no distension. There is no tenderness.  Musculoskeletal: He exhibits no edema.  Able to move all extremities   Lymphadenopathy:    He has no cervical adenopathy.  Neurological: He is alert.  Skin: Skin is warm and dry. He is not diaphoretic.  Psychiatric: He has a normal mood and affect.   ASSESSMENT/ PLAN:  TODAY:  1. Dyslipidemia:stable will continue lipitor 20 mg daily ldl is 92   2. Anemia of chronic disease: hgb is 9.9;  Stable will continue iron daily   3. CAD: status post cabg: recent NSTEMI stable  no complaint of chest pain present; will continue asa 81 mg daily is on statin  4. Vascular dementia: no change in status; his current weight is 203 pounds; will continue namzaric 28-10 mg daily   will continue depakote sprinkles 250 mg three times daily to stabilize mood  5. Diabetes: hgb a1c 7.2 (previous 7.9)  Stable will continue lantus 8 units nightly; novolog 70-150 10 units; 151-450 13 units after meals and tradjenta 5 mg daily urine micro-albumin 88. Is on asa and statin    6. CHF: EF (60-65%) he is presently stable will  is presently not on diuretic continue hydralazine 50 mg  three times daily   7. PVD: will continue asa 81 mg daily; tylenol 650 mg every 6 hours for pain management takes neurontin 200 mg nightly   8. Acute on chronic chronic renal disease:  without change   bun 51 creat 1.65  9. Hypertensive heart disease with CHF: is stable b/p: 162/78: will continue norvasc 2.5 mg daily; apresoline 50 mg three times daily   10. Acute on chronic respiratory failure with hypoxia: is without changes: will continue pulmicort neb treatment twice daily brovana neb treatment twice daily and duoneb every 6 hours as needed  11. HCAP; is stable will complete omnicef and will monitor his status.    Will check cbc; cmp Will follow up with Dr. Algie Coffer in 1-2 weeks.     MD is aware of resident's narcotic use and is in agreement with current plan of care. We will attempt to wean resident as apropriate   Synthia Innocent NP [facility]  Contact [phone] Monday through Friday 8am- 5pm  After hours call [phone]

## 2017-09-04 LAB — HEPATIC FUNCTION PANEL
ALK PHOS: 65 (ref 25–125)
ALT: 11 (ref 10–40)
AST: 12 — AB (ref 14–40)
BILIRUBIN, TOTAL: 0.3

## 2017-09-04 LAB — BASIC METABOLIC PANEL
BUN: 54 — AB (ref 4–21)
Creatinine: 2.5 — AB (ref 0.6–1.3)
GLUCOSE: 356
Potassium: 4.6 (ref 3.4–5.3)
SODIUM: 139 (ref 137–147)

## 2017-09-04 LAB — CBC AND DIFFERENTIAL
HEMATOCRIT: 30 — AB (ref 41–53)
Hemoglobin: 9.7 — AB (ref 13.5–17.5)
NEUTROS ABS: 4
Platelets: 113 — AB (ref 150–399)
WBC: 5.4

## 2017-09-05 ENCOUNTER — Encounter: Payer: Self-pay | Admitting: Adult Health

## 2017-09-05 ENCOUNTER — Non-Acute Institutional Stay (SKILLED_NURSING_FACILITY): Payer: Medicare Other | Admitting: Adult Health

## 2017-09-05 DIAGNOSIS — IMO0002 Reserved for concepts with insufficient information to code with codable children: Secondary | ICD-10-CM

## 2017-09-05 DIAGNOSIS — E1165 Type 2 diabetes mellitus with hyperglycemia: Secondary | ICD-10-CM

## 2017-09-05 DIAGNOSIS — E1121 Type 2 diabetes mellitus with diabetic nephropathy: Secondary | ICD-10-CM | POA: Diagnosis not present

## 2017-09-05 NOTE — Progress Notes (Signed)
Location:   [facility] Room Number: 226 A Place of Service:  SNF (31)   CODE STATUS: DNR (changed from full code to DNR on 09/04/17)  No Known Allergies  Chief Complaint  Patient presents with  . Acute Visit    Elevated glucose levels    HPI:  Staff reports that all his cbg readings are elevated nearly all above 200. There are no symptoms of elevated blood sugars including excessive thirst; excessive urination or anxiety. He is unable to participate in the hpi or ros.    Past Medical History:  Diagnosis Date  . Anemia   . Bradycardia   . CAD (coronary artery disease) of bypass graft    Multivessel  . Cataract    bilateral  . CHF (congestive heart failure) (HCC)   . Chronic kidney disease    Stage 3  . Dementia    MMSE 18/30 Feb 2012 [facility]), with behavioral disturbances  . Diabetes mellitus   . Dysphagia   . Hearing deficit   . Hyperlipidemia   . Hypertension   . Neuropathy   . PVD (peripheral vascular disease) (HCC)   . Thrombocytopenia (HCC)     Past Surgical History:  Procedure Laterality Date  . CORONARY ARTERY BYPASS GRAFT    . PERIPHERAL VASCULAR CATHETERIZATION N/A 11/28/2016   Procedure: Abdominal Aortogram w/Lower Extremity;  Surgeon: Maeola Harman, MD;  Location: [facility] INVASIVE CV LAB;  Service: Cardiovascular;  Laterality: N/A;    Social History   Social History  . Marital status: Single    Spouse name: N/A  . Number of children: N/A  . Years of education: N/A   Occupational History  . Not on file.   Social History Main Topics  . Smoking status: Former Smoker    Quit date: 11/19/1948  . Smokeless tobacco: Never Used  . Alcohol use No  . Drug use: No  . Sexual activity: Not Currently   Other Topics Concern  . Not on file   Social History Narrative  . No narrative on file   Family History  Problem Relation Age of Onset  . Diabetes Mother   . Dementia Mother        alzheimer  . Dementia Father   . Stroke  Father   . Heart disease Father   . Dementia Sister       VITAL SIGNS BP 134/72   Pulse 82   Temp 98.1 F (36.7 C)   Resp 18   Ht 5\' 8"  (1.727 m)   Wt 207 lb 4.8 oz (94 kg)   SpO2 96%   BMI 31.52 kg/m   Patient's Medications  New Prescriptions   No medications on file  Previous Medications   ACETAMINOPHEN (TYLENOL) 325 MG TABLET    Take 650 mg by mouth every 6 (six) hours.    AMLODIPINE (NORVASC) 2.5 MG TABLET    Take 2.5 mg by mouth daily.   ARFORMOTEROL (BROVANA) 15 MCG/2ML NEBU    Take 2 mLs (15 mcg total) by nebulization 2 (two) times daily.   ASPIRIN EC 81 MG TABLET    Take 81 mg by mouth daily.   ATORVASTATIN (LIPITOR) 20 MG TABLET    Take 20 mg by mouth at bedtime.   CHOLECALCIFEROL (VITAMIN D) 1000 UNITS TABLET    Take 2,000 Units by mouth daily.   DIVALPROEX (DEPAKOTE SPRINKLE) 125 MG CAPSULE    Take 250 mg by mouth 3 (three) times daily.    ENALAPRIL (VASOTEC)  20 MG TABLET    Take 20 mg by mouth daily.   EYELID CLEANSERS (OCUSOFT EYELID CLEANSING EX)    Apply to bilateral eye lids topically one time daily for Bilateral Marginal Blepharitis   FERROUS SULFATE 325 (65 FE) MG TABLET    Take 325 mg by mouth daily with breakfast.   GABAPENTIN (NEURONTIN) 100 MG CAPSULE    Take 200 mg by mouth at bedtime.   HYDRALAZINE (APRESOLINE) 50 MG TABLET    Take 50 mg by mouth every 8 (eight) hours.   INSULIN ASPART (NOVOLOG) 100 UNIT/ML INJECTION    Inject as per sliding scale subcutaneously after meals: if 70 - 149 = 0; 150 - 399 = 5 units.  Call MD if less than 70 and greater than 400   INSULIN DETEMIR (LEVEMIR) 100 UNIT/ML INJECTION    Inject 8 Units into the skin at bedtime.   IPRATROPIUM-ALBUTEROL (DUONEB) 0.5-2.5 (3) MG/3ML SOLN    Take 3 mLs by nebulization every 6 (six) hours as needed.   LINAGLIPTIN (TRADJENTA) 5 MG TABS TABLET    Take 5 mg by mouth daily.   MEMANTINE HCL-DONEPEZIL HCL (NAMZARIC) 28-10 MG CP24    Take 1 capsule by mouth at bedtime.    SACCHAROMYCES  BOULARDII (FLORASTOR) 250 MG CAPSULE    Take 250 mg by mouth 2 (two) times daily.   SULFACETAMIDE (BLEPH-10) 10 % OPHTHALMIC SOLUTION    Place 1 drop into both eyes 4 (four) times daily.  Modified Medications   No medications on file  Discontinued Medications   INSULIN GLARGINE (LANTUS) 100 UNIT/ML INJECTION    Inject 0.08 mLs (8 Units total) into the skin at bedtime.     SIGNIFICANT DIAGNOSTIC EXAMS  PREVIOUS   11-03-15: ct of abdomen and pelvis: 1. No definite acute abnormality seen within the abdomen or pelvis. 2. Trace fluid at the right lower quadrant is nonspecific. 3. Diffuse calcification along the abdominal aorta and its branches, including along the superior mesenteric artery, inferior mesenteric artery and at the proximal renal arteries bilaterally. 4. Mild bibasilar atelectasis noted. 5. Diffuse coronary artery calcifications seen. 6. Mild bilateral renal atrophy noted.  08-15-17: chest x-ray: mild chf  08-17-17: chest x-ray: Stable cardiomegaly.  08-18-17: 2-d echo: Impressions:  Normal LV size with moderate to severe LV hypertrophy. EF 60-65%.  Normal RV size and systolic function. No significant valvular abnormalities.  08-18-17: VQ scan: No evidence of pulmonary embolism. No VQ mismatches.  08-18-17: chest x-ray: Cardiomegaly with pulmonary vascular congestion and possible mild interstitial edema.   08-18-17: chest x-ray: Cardiomegaly with pulmonary vascular congestion and possible mild interstitial edema.  08-19-17: swallow study: nectar thick liquids,  08-19-17: renal ultrasound: Bilateral cortical atrophy and increased cortical echotexture consistent with medical renal disease. No hydronephrosis.  08-23-17: chest x-ray: Improved bilateral lower lobe density consistent with improving pneumonia. Mild persistent density left more than right.  NO NEW EXAMS   LABS REVIEWED:PREVIOUS:    08-29-16: wbc 5.1; hgb 10.7; hct 34.2; mcv 92.6; plt 118  glucose 151; bun 51.6;  creat 1.28; k+ 4.6; na++ 143  09-26-16: wbc 6.2; hgb 11.2; hct 36.6; mcv 99.0; plt 110; glucose 155; bun 29.0; creat 1.25; k+ 5.0; na++ 144; liver normal albumin 3.5 hgb a1c 7.8  01-25-17: chol 145; ldl 92; trig 199; hdl 27  hgb a1c 7.3 04-24-17: wbc 6.3; hgb 11.4; hct 36.2; mcv 97.2; plt 107; glucose 243; bun 35.1; creat 1.40; k+ 4.6; na++ 145; ca 8.0; liver normal albumin 3.1; ammonia  48  05-22-17: urine micro-albumin: >51.3 07-01-17: hgb a1c 7.9; depakote 15 urine micro-albumin 88  08-17-17: wbc 5.4; hgb 10.2; hct 32.6; mcv 97.9; plt 130; glucose 293; bun 48; creat 2.37; k+ 4.8; na++ 138; ca 7.9; BNP 314.7; urine culture: no growth; HIV; nr; hgb a1c 7.2 08-19-17: wbc 6.6; hgb 9.0; hct 27.5; mcv 96.5; plt 129; glucose 365; bun 64; creat 2.44; k+ 4.3; na++ 140; ca 7.1 08-21-17: wbc 9.9; hgb 8.5; hct 25.9; mcv 95.9; plt 153; glucose 342; bun 91; creat 2.80; k+ 4.3; na++ 140; ca 7.1 08-23-17: wbc 13.0; hgb 8.9; hct 28.1; mcv 95.9; plt 194; glucose 214; bun 61; creat 1.72; k+ 3.8; na++ 146; ca 7.7 08-25-17: wbc 13.9; hgb 9.5; hct 29.0; mcv 96.0; plt 195; glucose 282; bun 51; creat 1.72; k+ 4.9; na++ 142; ca 8.0 08-26-17: wbc 13.7; hgb 10.0; hct 32.4; mcv 97; plt 185; glucose 336; bun 51; creat 1.65; k+ 3.9; na++ 139; ca 8.0 08-27-17; wbc 11.9; hgb 9.9; hct 30.9; mcv 96; plt 174  NO NEW LABS      Review of Systems  Unable to perform ROS: Dementia (confused )   Physical Exam  Constitutional: No distress.  Thin   Neck: Neck supple.  Cardiovascular: Normal rate, regular rhythm, normal heart sounds and intact distal pulses.  Pulmonary/Chest: Effort normal and breath sounds normal. No respiratory distress.  Abdominal: Soft. Bowel sounds are normal. He exhibits no distension. There is no tenderness.  Musculoskeletal: He exhibits no edema.  Able to move all extremities   Lymphadenopathy:    He has no cervical adenopathy.  Neurological: He is alert.  Skin: Skin is warm and dry. He is not diaphoretic.    Psychiatric: He has a normal mood and affect.   ASSESSMENT/ PLAN:  TODAY:   1.  Diabetes: hgb a1c 7.2 (previous 7.9)  Stable will continuetradjenta 5 mg daily  Will increase levemir to 15 units nightly and will change novolog to 8 units after meals urine micro-albumin 88. Is on asa and statin    Will monitor    MD is aware of resident's narcotic use and is in agreement with current plan of care. We will attempt to wean resident as apropriate   Synthia Innocent NP [facility]  Contact [phone] Monday through Friday 8am- 5pm  After hours call [phone]

## 2017-09-11 ENCOUNTER — Encounter: Payer: Self-pay | Admitting: Adult Health

## 2017-09-11 LAB — BASIC METABOLIC PANEL
BUN: 63 — AB (ref 4–21)
CREATININE: 3.2 — AB (ref 0.6–1.3)
Glucose: 116
Potassium: 5.3 (ref 3.4–5.3)
Sodium: 140 (ref 137–147)

## 2017-09-11 LAB — CBC AND DIFFERENTIAL
HCT: 26 — AB (ref 41–53)
Hemoglobin: 8.4 — AB (ref 13.5–17.5)
NEUTROS ABS: 2
PLATELETS: 187 (ref 150–399)
WBC: 4.5

## 2017-09-11 NOTE — Progress Notes (Signed)
Location:   [facility] Room Number: 227 A Place of Service:  SNF (31)   CODE STATUS: DNR  No Known Allergies  Chief Complaint  Patient presents with  . Acute Visit    Low O2 stats    HPI:    Past Medical History:  Diagnosis Date  . Anemia   . Bradycardia   . CAD (coronary artery disease) of bypass graft    Multivessel  . Cataract    bilateral  . CHF (congestive heart failure) (HCC)   . Chronic kidney disease    Stage 3  . Dementia    MMSE 18/30 Feb 2012 [facility]), with behavioral disturbances  . Diabetes mellitus   . Dysphagia   . Hearing deficit   . Hyperlipidemia   . Hypertension   . Neuropathy   . PVD (peripheral vascular disease) (HCC)   . Thrombocytopenia (HCC)     Past Surgical History:  Procedure Laterality Date  . CORONARY ARTERY BYPASS GRAFT    . PERIPHERAL VASCULAR CATHETERIZATION N/A 11/28/2016   Procedure: Abdominal Aortogram w/Lower Extremity;  Surgeon: Maeola Harman, MD;  Location: [facility] INVASIVE CV LAB;  Service: Cardiovascular;  Laterality: N/A;    Social History   Social History  . Marital status: Single    Spouse name: N/A  . Number of children: N/A  . Years of education: N/A   Occupational History  . Not on file.   Social History Main Topics  . Smoking status: Former Smoker    Quit date: 11/19/1948  . Smokeless tobacco: Never Used  . Alcohol use No  . Drug use: No  . Sexual activity: Not Currently   Other Topics Concern  . Not on file   Social History Narrative  . No narrative on file   Family History  Problem Relation Age of Onset  . Diabetes Mother   . Dementia Mother        alzheimer  . Dementia Father   . Stroke Father   . Heart disease Father   . Dementia Sister       VITAL SIGNS BP (!) 133/46   Pulse 85   Temp (!) 100.9 F (38.3 C)   Resp 18   Ht 5\' 8"  (1.727 m)   Wt 207 lb 4.8 oz (94 kg)   SpO2 97%   BMI 31.52 kg/m   Patient's Medications  New Prescriptions   No  medications on file  Previous Medications   ACETAMINOPHEN (TYLENOL) 325 MG TABLET    Take 650 mg by mouth every 6 (six) hours.    AMLODIPINE (NORVASC) 2.5 MG TABLET    Take 2.5 mg by mouth daily.   ARFORMOTEROL (BROVANA) 15 MCG/2ML NEBU    Take 2 mLs (15 mcg total) by nebulization 2 (two) times daily.   ASPIRIN EC 81 MG TABLET    Take 81 mg by mouth daily.   ATORVASTATIN (LIPITOR) 20 MG TABLET    Take 20 mg by mouth at bedtime.   BUDESONIDE (PULMICORT) 0.25 MG/2ML NEBULIZER SOLUTION    Take 0.25 mg by nebulization 2 (two) times daily.   CHOLECALCIFEROL (VITAMIN D) 1000 UNITS TABLET    Take 2,000 Units by mouth daily.   DIVALPROEX (DEPAKOTE SPRINKLE) 125 MG CAPSULE    Take 250 mg by mouth 3 (three) times daily.    ENALAPRIL (VASOTEC) 20 MG TABLET    Take 20 mg by mouth daily.   EYELID CLEANSERS (OCUSOFT EYELID CLEANSING EX)    Apply to  bilateral eye lids topically one time daily for Bilateral Marginal Blepharitis   FERROUS SULFATE 325 (65 FE) MG TABLET    Take 325 mg by mouth daily with breakfast.   GABAPENTIN (NEURONTIN) 100 MG CAPSULE    Take 200 mg by mouth at bedtime.   HYDRALAZINE (APRESOLINE) 50 MG TABLET    Take 50 mg by mouth every 8 (eight) hours.   INSULIN ASPART (NOVOLOG) 100 UNIT/ML INJECTION    Inject as per sliding scale subcutaneously after meals: 0 - 150 = 0 units, 151 - 450 = 10 units Call MD if less than 70 and greater than 450   INSULIN DETEMIR (LEVEMIR) 100 UNIT/ML INJECTION    Inject 15 Units into the skin at bedtime.    IPRATROPIUM-ALBUTEROL (DUONEB) 0.5-2.5 (3) MG/3ML SOLN    Take 3 mLs by nebulization every 6 (six) hours as needed.   LINAGLIPTIN (TRADJENTA) 5 MG TABS TABLET    Take 5 mg by mouth daily.   MEMANTINE HCL-DONEPEZIL HCL (NAMZARIC) 28-10 MG CP24    Take 1 capsule by mouth at bedtime.    SACCHAROMYCES BOULARDII (FLORASTOR) 250 MG CAPSULE    Take 250 mg by mouth 2 (two) times daily.   SULFACETAMIDE (BLEPH-10) 10 % OPHTHALMIC SOLUTION    Place 1 drop into both  eyes 4 (four) times daily.  Modified Medications   No medications on file  Discontinued Medications   No medications on file     SIGNIFICANT DIAGNOSTIC EXAMS       ASSESSMENT/ PLAN:    MD is aware of resident's narcotic use and is in agreement with current plan of care. We will attempt to wean resident as apropriate   Synthia Innocenteborah Maire Govan NP [facility]  Contact [phone] Monday through Friday 8am- 5pm  After hours call [phone]   This encounter was created in error - please disregard.

## 2017-09-12 ENCOUNTER — Non-Acute Institutional Stay (SKILLED_NURSING_FACILITY): Payer: Medicare Other | Admitting: Adult Health

## 2017-09-12 ENCOUNTER — Encounter: Payer: Self-pay | Admitting: Adult Health

## 2017-09-12 DIAGNOSIS — J189 Pneumonia, unspecified organism: Secondary | ICD-10-CM

## 2017-09-12 DIAGNOSIS — R410 Disorientation, unspecified: Secondary | ICD-10-CM | POA: Diagnosis not present

## 2017-09-12 LAB — CBC AND DIFFERENTIAL
HEMATOCRIT: 26 — AB (ref 41–53)
HEMOGLOBIN: 8.3 — AB (ref 13.5–17.5)
Neutrophils Absolute: 3
Platelets: 206 (ref 150–399)
WBC: 4.7

## 2017-09-12 NOTE — Progress Notes (Signed)
Location:   [facility] Room Number: 227 A Place of Service:  SNF (31)   CODE STATUS: DNR  No Known Allergies  Chief Complaint  Patient presents with  . Acute Visit    Change in status    HPI:  Staff reports that this morning that he is having a fever of 101. He is less responsive. There are no signs of respiratory distress present. His po intake has been poor. He is unable to participate in the hpi or ros. The nursing staff is very concerned that he has aspirated once again and has pneumonia.    Past Medical History:  Diagnosis Date  . Anemia   . Bradycardia   . CAD (coronary artery disease) of bypass graft    Multivessel  . Cataract    bilateral  . CHF (congestive heart failure) (HCC)   . Chronic kidney disease    Stage 3  . Dementia    MMSE 18/30 Feb 2012 [facility]), with behavioral disturbances  . Diabetes mellitus   . Dysphagia   . Hearing deficit   . Hyperlipidemia   . Hypertension   . Neuropathy   . PVD (peripheral vascular disease) (HCC)   . Thrombocytopenia (HCC)     Past Surgical History:  Procedure Laterality Date  . CORONARY ARTERY BYPASS GRAFT    . PERIPHERAL VASCULAR CATHETERIZATION N/A 11/28/2016   Procedure: Abdominal Aortogram w/Lower Extremity;  Surgeon: Maeola Harman, MD;  Location: [facility] INVASIVE CV LAB;  Service: Cardiovascular;  Laterality: N/A;    Social History   Social History  . Marital status: Single    Spouse name: N/A  . Number of children: N/A  . Years of education: N/A   Occupational History  . Not on file.   Social History Main Topics  . Smoking status: Former Smoker    Quit date: 11/19/1948  . Smokeless tobacco: Never Used  . Alcohol use No  . Drug use: No  . Sexual activity: Not Currently   Other Topics Concern  . Not on file   Social History Narrative  . No narrative on file   Family History  Problem Relation Age of Onset  . Diabetes Mother   . Dementia Mother        alzheimer  .  Dementia Father   . Stroke Father   . Heart disease Father   . Dementia Sister       VITAL SIGNS BP (!) 130/50   Pulse (!) 102   Temp (!) 101 F (38.3 C)   Resp 17   Ht 5\' 8"  (1.727 m)   Wt 207 lb 4.8 oz (94 kg)   SpO2 98% Comment: 2L  BMI 31.52 kg/m   Patient's Medications  New Prescriptions   No medications on file  Previous Medications   ACETAMINOPHEN (TYLENOL) 325 MG TABLET    Take 650 mg by mouth every 6 (six) hours.    AMLODIPINE (NORVASC) 2.5 MG TABLET    Take 2.5 mg by mouth daily.   ARFORMOTEROL (BROVANA) 15 MCG/2ML NEBU    Take 2 mLs (15 mcg total) by nebulization 2 (two) times daily.   ASPIRIN EC 81 MG TABLET    Take 81 mg by mouth daily.   ATORVASTATIN (LIPITOR) 20 MG TABLET    Take 20 mg by mouth at bedtime.   BUDESONIDE (PULMICORT) 0.25 MG/2ML NEBULIZER SOLUTION    Take 0.25 mg by nebulization 2 (two) times daily.   CHOLECALCIFEROL (VITAMIN D) 1000 UNITS TABLET  Take 2,000 Units by mouth daily.   DIVALPROEX (DEPAKOTE SPRINKLE) 125 MG CAPSULE    Take 250 mg by mouth 3 (three) times daily.    ENALAPRIL (VASOTEC) 20 MG TABLET    Take 20 mg by mouth daily.   EYELID CLEANSERS (OCUSOFT EYELID CLEANSING EX)    Apply to bilateral eye lids topically one time daily for Bilateral Marginal Blepharitis   FERROUS SULFATE 325 (65 FE) MG TABLET    Take 325 mg by mouth daily with breakfast.   GABAPENTIN (NEURONTIN) 100 MG CAPSULE    Take 200 mg by mouth at bedtime.   HYDRALAZINE (APRESOLINE) 50 MG TABLET    Take 50 mg by mouth every 8 (eight) hours.   INSULIN ASPART (NOVOLOG) 100 UNIT/ML INJECTION    Inject as per sliding scale subcutaneously after meals: 0 - 150 = 0 units, 151 - 450 = 10 units Call MD if less than 70 and greater than 450   INSULIN DETEMIR (LEVEMIR) 100 UNIT/ML INJECTION    Inject 15 Units into the skin at bedtime.    IPRATROPIUM-ALBUTEROL (DUONEB) 0.5-2.5 (3) MG/3ML SOLN    Take 3 mLs by nebulization every 6 (six) hours as needed.   LINAGLIPTIN (TRADJENTA) 5  MG TABS TABLET    Take 5 mg by mouth daily.   MEMANTINE HCL-DONEPEZIL HCL (NAMZARIC) 28-10 MG CP24    Take 1 capsule by mouth at bedtime.    NUTRITIONAL SUPPLEMENTS (NUTRITIONAL SUPPLEMENT PO)    HSG Puree diet - HSG Puree texture, Nectar consistency   SACCHAROMYCES BOULARDII (FLORASTOR) 250 MG CAPSULE    Take 250 mg by mouth 2 (two) times daily.  Modified Medications   No medications on file  Discontinued Medications   SULFACETAMIDE (BLEPH-10) 10 % OPHTHALMIC SOLUTION    Place 1 drop into both eyes 4 (four) times daily.     SIGNIFICANT DIAGNOSTIC EXAMS   PREVIOUS   11-03-15: ct of abdomen and pelvis: 1. No definite acute abnormality seen within the abdomen or pelvis. 2. Trace fluid at the right lower quadrant is nonspecific. 3. Diffuse calcification along the abdominal aorta and its branches, including along the superior mesenteric artery, inferior mesenteric artery and at the proximal renal arteries bilaterally. 4. Mild bibasilar atelectasis noted. 5. Diffuse coronary artery calcifications seen. 6. Mild bilateral renal atrophy noted.  08-15-17: chest x-ray: mild chf  08-17-17: chest x-ray: Stable cardiomegaly.  08-18-17: 2-d echo: Impressions:  Normal LV size with moderate to severe LV hypertrophy. EF 60-65%.  Normal RV size and systolic function. No significant valvular abnormalities.  08-18-17: VQ scan: No evidence of pulmonary embolism. No VQ mismatches.  08-18-17: chest x-ray: Cardiomegaly with pulmonary vascular congestion and possible mild interstitial edema.   08-18-17: chest x-ray: Cardiomegaly with pulmonary vascular congestion and possible mild interstitial edema.  08-19-17: swallow study: nectar thick liquids,  08-19-17: renal ultrasound: Bilateral cortical atrophy and increased cortical echotexture consistent with medical renal disease. No hydronephrosis.  08-23-17: chest x-ray: Improved bilateral lower lobe density consistent with improving pneumonia. Mild persistent  density left more than right.  TODAY:   09-11-17: chest x-ray: no acute cardiopulmonary disease   LABS REVIEWED:PREVIOUS:    08-29-16: wbc 5.1; hgb 10.7; hct 34.2; mcv 92.6; plt 118  glucose 151; bun 51.6; creat 1.28; k+ 4.6; na++ 143  09-26-16: wbc 6.2; hgb 11.2; hct 36.6; mcv 99.0; plt 110; glucose 155; bun 29.0; creat 1.25; k+ 5.0; na++ 144; liver normal albumin 3.5 hgb a1c 7.8  01-25-17: chol 145; ldl 92;  trig 199; hdl 27  hgb a1c 7.3 04-24-17: wbc 6.3; hgb 11.4; hct 36.2; mcv 97.2; plt 107; glucose 243; bun 35.1; creat 1.40; k+ 4.6; na++ 145; ca 8.0; liver normal albumin 3.1; ammonia 48  05-22-17: urine micro-albumin: >51.3 07-01-17: hgb a1c 7.9; depakote 15 urine micro-albumin 88  08-17-17: wbc 5.4; hgb 10.2; hct 32.6; mcv 97.9; plt 130; glucose 293; bun 48; creat 2.37; k+ 4.8; na++ 138; ca 7.9; BNP 314.7; urine culture: no growth; HIV; nr; hgb a1c 7.2 08-19-17: wbc 6.6; hgb 9.0; hct 27.5; mcv 96.5; plt 129; glucose 365; bun 64; creat 2.44; k+ 4.3; na++ 140; ca 7.1 08-21-17: wbc 9.9; hgb 8.5; hct 25.9; mcv 95.9; plt 153; glucose 342; bun 91; creat 2.80; k+ 4.3; na++ 140; ca 7.1 08-23-17: wbc 13.0; hgb 8.9; hct 28.1; mcv 95.9; plt 194; glucose 214; bun 61; creat 1.72; k+ 3.8; na++ 146; ca 7.7 08-25-17: wbc 13.9; hgb 9.5; hct 29.0; mcv 96.0; plt 195; glucose 282; bun 51; creat 1.72; k+ 4.9; na++ 142; ca 8.0 08-26-17: wbc 13.7; hgb 10.0; hct 32.4; mcv 97; plt 185; glucose 336; bun 51; creat 1.65; k+ 3.9; na++ 139; ca 8.0 08-27-17; wbc 11.9; hgb 9.9; hct 30.9; mcv 96; plt 174  TODAY:   09-11-17: wbc 4.5 hgb 8.4; hct 26.1; mcv 98.7. ;plt 187; glucose 116; bun 62.5; creat 3.17; k+ 5.3; na++ 140; ca 8.4      Review of Systems  Unable to perform ROS: Dementia (unable to answer questions )   Physical Exam  Constitutional: He appears distressed.  Thin   Neck: No thyromegaly present.  Cardiovascular: Normal rate, regular rhythm and intact distal pulses.  Murmur heard. Pulmonary/Chest: He is in  respiratory distress. He has wheezes. He has rales.  Increased effort present   Abdominal: Soft. Bowel sounds are normal. He exhibits no distension. There is no tenderness.  Lymphadenopathy:    He has no cervical adenopathy.  Neurological: He is alert.  Skin: Skin is warm. He is diaphoretic.    ASSESSMENT/ PLAN:  1. Acute delirium 2. Health care associated  pneumonia Will continue IVF started and will increase rate to 100 cc per hour for the 2 liters of fluid Will give 1 gm rocephin now then begin IV daily for total of 10 days  Will repeat chest x-ray Will get cbc; blood culture X2 sites and ua/c&s    MD is aware of resident's narcotic use and is in agreement with current plan of care. We will attempt to wean resident as apropriate   Synthia Innocenteborah Green NP [facility]  Contact [phone] Monday through Friday 8am- 5pm  After hours call [phone]

## 2017-09-24 ENCOUNTER — Other Ambulatory Visit (HOSPITAL_COMMUNITY): Payer: Self-pay | Admitting: Internal Medicine

## 2017-09-24 DIAGNOSIS — R1319 Other dysphagia: Secondary | ICD-10-CM

## 2017-10-01 ENCOUNTER — Ambulatory Visit (HOSPITAL_COMMUNITY)
Admission: RE | Admit: 2017-10-01 | Discharge: 2017-10-01 | Disposition: A | Discharge status: Home / Self Care | Payer: Medicare Other | Source: Ambulatory Visit | Attending: Internal Medicine | Admitting: Internal Medicine

## 2017-10-01 ENCOUNTER — Ambulatory Visit (HOSPITAL_COMMUNITY)
Admission: RE | Admit: 2017-10-01 | Discharge: 2017-10-01 | Disposition: A | Discharge status: Home / Self Care | Payer: No Typology Code available for payment source | Source: Ambulatory Visit | Attending: Internal Medicine | Admitting: Internal Medicine

## 2017-10-01 DIAGNOSIS — R1319 Other dysphagia: Secondary | ICD-10-CM | POA: Diagnosis present

## 2017-10-01 NOTE — Progress Notes (Signed)
Modified Barium Swallow Progress Note  Patient Details  Name: Travis Coleman MRN: [id number] Date of Birth: [birth year]  Today's Date: 10/01/2017  Modified Barium Swallow completed.  Full report located under Chart Review in the Imaging Section.  Brief recommendations include the following:  Clinical Impression  Pt has a mild oropharyngeal dysphagia with improvements noted from previous MBS. His oral phase is still mildly disorganized, with decreased bolus cohesion particularly with liquids. His oral transit is quick with purees and solids, although there is a mild coating of lingual residue. He does not do much bolus preparation with soft solids (unsure if he has any dentures?), but regardless the boluses are transited through the oral cavity quickly and mild vallecular residue is present regardless of whether he is consuming solids or liquids. Pt has some premature spillage of thin liquids into the pharynx, with small amounts of penetration that occurs during cup sips. This is consistently ejected out as pt reflexively performs additional subswallows. The only time pt aspirated was when he consumed thin liquids by straw, at which time he did spontaneously cough. Despite his cough reflex, he could not clear the aspirates from his trachea. No aspiration occurred with cup sips even as pt took very large boluses. Compensatory strategies were not attempted given pt's cognitive status and his limited ability to hear instructions. Recommend advancement up to Dys 2 textures and thin liquid by cup only.    Swallow Evaluation Recommendations       SLP Diet Recommendations: Dysphagia 2 (Fine chop) solids;Thin liquid   Liquid Administration via: Cup;No straw   Medication Administration: Crushed with puree   Supervision: Staff to assist with self feeding;Full supervision/cueing for compensatory strategies   Compensations: Slow rate;Small sips/bites;Follow solids with liquid   Postural Changes: Remain  semi-upright after after feeds/meals (Comment);Seated upright at 90 degrees   Oral Care Recommendations: Oral care BID        Travis Coleman, Travis Coleman 10/01/2017,2:21 PM   Travis Coleman, Travis Coleman [phone]

## 2017-10-02 ENCOUNTER — Encounter: Payer: Self-pay | Admitting: Adult Health

## 2017-10-02 ENCOUNTER — Non-Acute Institutional Stay (SKILLED_NURSING_FACILITY): Payer: Medicare Other | Admitting: Adult Health

## 2017-10-02 DIAGNOSIS — Z7189 Other specified counseling: Secondary | ICD-10-CM

## 2017-10-02 DIAGNOSIS — I5022 Chronic systolic (congestive) heart failure: Secondary | ICD-10-CM | POA: Diagnosis not present

## 2017-10-02 DIAGNOSIS — F0150 Vascular dementia without behavioral disturbance: Secondary | ICD-10-CM | POA: Diagnosis not present

## 2017-10-02 NOTE — Progress Notes (Signed)
Location:   [facility] Room Number: 227 A Place of Service:  SNF (31)   CODE STATUS: DNR  No Known Allergies  Chief Complaint  Patient presents with  . Acute Visit    Care Plan Meeting    HPI:  We have come together with the care plan meeting with family present. There were no nursing issues at this time. We have discussed her care plan. We did discuss his swallow study and is no thin liquids with chopped diet. We did discuss his medical status and his medications. We did review his MOST form. There are no nursing concerns at this time.   Past Medical History:  Diagnosis Date  . Anemia   . Bradycardia   . CAD (coronary artery disease) of bypass graft    Multivessel  . Cataract    bilateral  . CHF (congestive heart failure) (HCC)   . Chronic kidney disease    Stage 3  . Dementia    MMSE 18/30 Feb 2012 [facility] with behavioral disturbances  . Diabetes mellitus   . Dysphagia   . Hearing deficit   . Hyperlipidemia   . Hypertension   . Neuropathy   . PVD (peripheral vascular disease) (HCC)   . Thrombocytopenia (HCC)     Past Surgical History:  Procedure Laterality Date  . CORONARY ARTERY BYPASS GRAFT      Social History   Socioeconomic History  . Marital status: Single    Spouse name: Not on file  . Number of children: Not on file  . Years of education: Not on file  . Highest education level: Not on file  Social Needs  . Financial resource strain: Not on file  . Food insecurity - worry: Not on file  . Food insecurity - inability: Not on file  . Transportation needs - medical: Not on file  . Transportation needs - non-medical: Not on file  Occupational History  . Not on file  Tobacco Use  . Smoking status: Former Smoker    Last attempt to quit: 11/19/1948    Years since quitting: 68.9  . Smokeless tobacco: Never Used  Substance and Sexual Activity  . Alcohol use: No  . Drug use: No  . Sexual activity: Not Currently  Other Topics Concern   . Not on file  Social History Narrative  . Not on file   Family History  Problem Relation Age of Onset  . Diabetes Mother   . Dementia Mother        alzheimer  . Dementia Father   . Stroke Father   . Heart disease Father   . Dementia Sister       VITAL SIGNS Ht 5\' 8"  (1.727 m)   Wt 205 lb 14.4 oz (93.4 kg)   BMI 31.31 kg/m      Medication List        Accurate as of 10/02/17  4:28 PM. Always use your most recent med list.          acetaminophen 325 MG tablet Commonly known as:  TYLENOL   amLODipine 2.5 MG tablet Commonly known as:  NORVASC   arformoterol 15 MCG/2ML Nebu Commonly known as:  BROVANA Take 2 mLs (15 mcg total) by nebulization 2 (two) times daily.   aspirin EC 81 MG tablet   atorvastatin 20 MG tablet Commonly known as:  LIPITOR   budesonide 0.25 MG/2ML nebulizer solution Commonly known as:  PULMICORT   cholecalciferol 1000 units tablet Commonly known as:  VITAMIN D   divalproex 125 MG capsule Commonly known as:  DEPAKOTE SPRINKLE   enalapril 20 MG tablet Commonly known as:  VASOTEC   ferrous sulfate 325 (65 FE) MG tablet   gabapentin 100 MG capsule Commonly known as:  NEURONTIN   hydrALAZINE 50 MG tablet Commonly known as:  APRESOLINE   ipratropium-albuterol 0.5-2.5 (3) MG/3ML Soln Commonly known as:  DUONEB   LEVEMIR 100 UNIT/ML injection Generic drug:  insulin detemir   linagliptin 5 MG Tabs tablet Commonly known as:  TRADJENTA   NAMZARIC 28-10 MG Cp24 Generic drug:  Memantine HCl-Donepezil HCl   NOVOLOG 100 UNIT/ML injection Generic drug:  insulin aspart   NUTRITIONAL SUPPLEMENT PO   OCUSOFT EYELID CLEANSING EX   saccharomyces boulardii 250 MG capsule Commonly known as:  FLORASTOR        SIGNIFICANT DIAGNOSTIC EXAMS   PREVIOUS   11-03-15: ct of abdomen and pelvis: 1. No definite acute abnormality seen within the abdomen or pelvis. 2. Trace fluid at the right lower quadrant is nonspecific. 3. Diffuse  calcification along the abdominal aorta and its branches, including along the superior mesenteric artery, inferior mesenteric artery and at the proximal renal arteries bilaterally. 4. Mild bibasilar atelectasis noted. 5. Diffuse coronary artery calcifications seen. 6. Mild bilateral renal atrophy noted.  08-15-17: chest x-ray: mild chf  08-17-17: chest x-ray: Stable cardiomegaly.  08-18-17: 2-d echo: Impressions:  Normal LV size with moderate to severe LV hypertrophy. EF 60-65%.  Normal RV size and systolic function. No significant valvular abnormalities.  08-18-17: VQ scan: No evidence of pulmonary embolism. No VQ mismatches.  08-18-17: chest x-ray: Cardiomegaly with pulmonary vascular congestion and possible mild interstitial edema.   08-18-17: chest x-ray: Cardiomegaly with pulmonary vascular congestion and possible mild interstitial edema.  08-19-17: swallow study: nectar thick liquids,  08-19-17: renal ultrasound: Bilateral cortical atrophy and increased cortical echotexture consistent with medical renal disease. No hydronephrosis.  08-23-17: chest x-ray: Improved bilateral lower lobe density consistent with improving pneumonia. Mild persistent density left more than right.  09-11-17: chest x-ray: no acute cardiopulmonary disease  TODAY   10-01-17: swallow study: chopped with thin liquids    LABS REVIEWED:PREVIOUS:    09-26-16: wbc 6.2; hgb 11.2; hct 36.6; mcv 99.0; plt 110; glucose 155; bun 29.0; creat 1.25; k+ 5.0; na++ 144; liver normal albumin 3.5 hgb a1c 7.8  01-25-17: chol 145; ldl 92; trig 199; hdl 27  hgb a1c 7.3 04-24-17: wbc 6.3; hgb 11.4; hct 36.2; mcv 97.2; plt 107; glucose 243; bun 35.1; creat 1.40; k+ 4.6; na++ 145; ca 8.0; liver normal albumin 3.1; ammonia 48  05-22-17: urine micro-albumin: >51.3 07-01-17: hgb a1c 7.9; depakote 15 urine micro-albumin 88  08-17-17: wbc 5.4; hgb 10.2; hct 32.6; mcv 97.9; plt 130; glucose 293; bun 48; creat 2.37; k+ 4.8; na++ 138; ca 7.9; BNP  314.7; urine culture: no growth; HIV; nr; hgb a1c 7.2 08-19-17: wbc 6.6; hgb 9.0; hct 27.5; mcv 96.5; plt 129; glucose 365; bun 64; creat 2.44; k+ 4.3; na++ 140; ca 7.1 08-21-17: wbc 9.9; hgb 8.5; hct 25.9; mcv 95.9; plt 153; glucose 342; bun 91; creat 2.80; k+ 4.3; na++ 140; ca 7.1 08-23-17: wbc 13.0; hgb 8.9; hct 28.1; mcv 95.9; plt 194; glucose 214; bun 61; creat 1.72; k+ 3.8; na++ 146; ca 7.7 08-25-17: wbc 13.9; hgb 9.5; hct 29.0; mcv 96.0; plt 195; glucose 282; bun 51; creat 1.72; k+ 4.9; na++ 142; ca 8.0 08-26-17: wbc 13.7; hgb 10.0; hct 32.4; mcv 97; plt 185;  glucose 336; bun 51; creat 1.65; k+ 3.9; na++ 139; ca 8.0 08-27-17; wbc 11.9; hgb 9.9; hct 30.9; mcv 96; plt 174 09-11-17: wbc 4.5 hgb 8.4; hct 26.1; mcv 98.7. ;plt 187; glucose 116; bun 62.5; creat 3.17; k+ 5.3; na++ 140; ca 8.4    NO NEW LABS     Review of Systems  Unable to perform ROS: Dementia (unable to answer questions )    Physical Exam  Constitutional: No distress.  Thin   Neck: Neck supple. No thyromegaly present.  Cardiovascular: Normal rate, regular rhythm and intact distal pulses.  Murmur heard. Pulmonary/Chest: Effort normal and breath sounds normal. No respiratory distress.  Abdominal: Soft. Bowel sounds are normal. He exhibits no distension. There is no tenderness.  Musculoskeletal: He exhibits no edema.  Able to move all extremities   Lymphadenopathy:    He has no cervical adenopathy.  Skin: Skin is warm and dry. He is not diaphoretic.  Psychiatric: He has a normal mood and affect.     ASSESSMENT/ PLAN:  1. Chronic systolic heart failure 2. Vascular dementia  Will continue his plan of care and will not make changes  Time spent with patient; family and care plan team: 45 minutes (20 minutes with advanced care plan): discussed his overall medical status; his medications; his plan of care and reviewed and discussed his MOST form no changes at this time; family verbalized understanding.   MD is aware of  resident's narcotic use and is in agreement with current plan of care. We will attempt to wean resident as apropriate   Synthia Innocenteborah Tjay Velazquez NP [facility]  Contact [phone] Monday through Friday 8am- 5pm  After hours call [phone]

## 2017-10-05 DIAGNOSIS — R410 Disorientation, unspecified: Secondary | ICD-10-CM | POA: Insufficient documentation

## 2017-10-07 ENCOUNTER — Non-Acute Institutional Stay (SKILLED_NURSING_FACILITY): Payer: Medicare Other | Admitting: Adult Health

## 2017-10-07 ENCOUNTER — Encounter: Payer: Self-pay | Admitting: Adult Health

## 2017-10-07 DIAGNOSIS — F0150 Vascular dementia without behavioral disturbance: Secondary | ICD-10-CM | POA: Diagnosis not present

## 2017-10-07 DIAGNOSIS — E1169 Type 2 diabetes mellitus with other specified complication: Secondary | ICD-10-CM

## 2017-10-07 DIAGNOSIS — I25709 Atherosclerosis of coronary artery bypass graft(s), unspecified, with unspecified angina pectoris: Secondary | ICD-10-CM

## 2017-10-07 DIAGNOSIS — E785 Hyperlipidemia, unspecified: Secondary | ICD-10-CM | POA: Diagnosis not present

## 2017-10-07 DIAGNOSIS — I209 Angina pectoris, unspecified: Secondary | ICD-10-CM

## 2017-10-07 DIAGNOSIS — N183 Chronic kidney disease, stage 3 unspecified: Secondary | ICD-10-CM

## 2017-10-07 DIAGNOSIS — D631 Anemia in chronic kidney disease: Secondary | ICD-10-CM

## 2017-10-07 NOTE — Progress Notes (Signed)
Location:   [facility] Room Number: 227 A Place of Service:  SNF (31)   CODE STATUS: DNR  No Known Allergies  Chief Complaint  Patient presents with  . Medical Management of Chronic Issues    Dyslipidemia; anemia; cad; dementia    HPI:  He is a 83 year old long term resident of this facility being seen for the management of his chronic illnesses: vascular dementia without behavioral disturbance; dyslipidemia associated with type 2 diabetes mellitus; cad s/p cabg; anemia is chronic kidney disease stage III.  He is unable to fully participate in the hpi or ros. There are no reports of shortness of breath; no reports of chest pain; and no changes in behaviors. There are no nursing concerns at this time.   Past Medical History:  Diagnosis Date  . Anemia   . Bradycardia   . CAD (coronary artery disease) of bypass graft    Multivessel  . Cataract    bilateral  . CHF (congestive heart failure) (HCC)   . Chronic kidney disease    Stage 3  . Dementia    MMSE 18/30 Feb 2012 [facility](Hospital), with behavioral disturbances  . Diabetes mellitus   . Dysphagia   . Hearing deficit   . Hyperlipidemia   . Hypertension   . Neuropathy   . PVD (peripheral vascular disease) (HCC)   . Thrombocytopenia (HCC)     Past Surgical History:  Procedure Laterality Date  . Abdominal Aortogram w/Lower Extremity N/A 11/28/2016   Performed by Maeola Harmanain, Brandon Christopher[facility]  . CORONARY ARTERY BYPASS GRAFT      Social History   Socioeconomic History  . Marital status: Single    Spouse name: Not on file  . Number of children: Not on file  . Years of education: Not on file  . Highest education level: Not on file  Social Needs  . Financial resource strain: Not on file  . Food insecurity - worry: Not on file  . Food insecurity - inability: Not on file  . Transportation needs - medical: Not on file  . Transportation needs - non-medical: Not on file  Occupational History   . Not on file  Tobacco Use  . Smoking status: Former Smoker    Last attempt to quit: 11/19/1948    Years since quitting: 68.9  . Smokeless tobacco: Never Used  Substance and Sexual Activity  . Alcohol use: No  . Drug use: No  . Sexual activity: Not Currently  Other Topics Concern  . Not on file  Social History Narrative  . Not on file   Family History  Problem Relation Age of Onset  . Diabetes Mother   . Dementia Mother        alzheimer  . Dementia Father   . Stroke Father   . Heart disease Father   . Dementia Sister       VITAL SIGNS BP 101/60   Pulse 90   Temp 97.6 F (36.4 C)   Resp 18   Ht 5\' 8"  (1.727 m)   Wt 205 lb 14.4 oz (93.4 kg)   SpO2 98%   BMI 31.31 kg/m   Outpatient Encounter Medications as of 10/07/2017  Medication Sig  . acetaminophen (TYLENOL) 325 MG tablet Take 650 mg by mouth every 6 (six) hours as needed.   Marland Kitchen. amLODipine (NORVASC) 2.5 MG tablet Take 2.5 mg by mouth daily.  Marland Kitchen. arformoterol (BROVANA) 15 MCG/2ML NEBU Take 2 mLs (15  mcg total) by nebulization 2 (two) times daily.  Marland Kitchen. aspirin EC 81 MG tablet Take 81 mg by mouth daily.  Marland Kitchen. atorvastatin (LIPITOR) 20 MG tablet Take 20 mg by mouth at bedtime.  . budesonide (PULMICORT) 0.25 MG/2ML nebulizer solution Take 0.25 mg by nebulization 2 (two) times daily.  . cholecalciferol (VITAMIN D) 1000 UNITS tablet Take 2,000 Units by mouth daily.  . divalproex (DEPAKOTE SPRINKLE) 125 MG capsule Take 250 mg by mouth 3 (three) times daily.   . enalapril (VASOTEC) 20 MG tablet Take 20 mg by mouth daily.  . Eyelid Cleansers (OCUSOFT EYELID CLEANSING EX) Apply to bilateral eye lids topically one time daily for Bilateral Marginal Blepharitis  . ferrous sulfate 325 (65 FE) MG tablet Take 325 mg by mouth daily with breakfast.  . gabapentin (NEURONTIN) 100 MG capsule Take 200 mg by mouth at bedtime.  . hydrALAZINE (APRESOLINE) 50 MG tablet Take 50 mg by mouth every 8 (eight) hours.  . insulin aspart (NOVOLOG) 100  UNIT/ML injection Inject as per sliding scale subcutaneously after meals: 0 - 150 = 0 units, 151 - 450 = 10 units Call MD if less than 70 and greater than 450  . insulin detemir (LEVEMIR) 100 UNIT/ML injection Inject 15 Units into the skin at bedtime.   Marland Kitchen. ipratropium-albuterol (DUONEB) 0.5-2.5 (3) MG/3ML SOLN Take 3 mLs by nebulization every 6 (six) hours as needed.  . linagliptin (TRADJENTA) 5 MG TABS tablet Take 5 mg by mouth daily.  . Memantine HCl-Donepezil HCl (NAMZARIC) 28-10 MG CP24 Take 1 capsule by mouth at bedtime.   . Nutritional Supplements (NUTRITIONAL SUPPLEMENT PO) HSG Puree diet - HSG Puree texture, Nectar consistency  . saccharomyces boulardii (FLORASTOR) 250 MG capsule Take 250 mg by mouth 2 (two) times daily.   No facility-administered encounter medications on file as of 10/07/2017.      SIGNIFICANT DIAGNOSTIC EXAMS  PREVIOUS   11-03-15: ct of abdomen and pelvis: 1. No definite acute abnormality seen within the abdomen or pelvis. 2. Trace fluid at the right lower quadrant is nonspecific. 3. Diffuse calcification along the abdominal aorta and its branches, including along the superior mesenteric artery, inferior mesenteric artery and at the proximal renal arteries bilaterally. 4. Mild bibasilar atelectasis noted. 5. Diffuse coronary artery calcifications seen. 6. Mild bilateral renal atrophy noted.  08-15-17: chest x-ray: mild chf  08-17-17: chest x-ray: Stable cardiomegaly.  08-18-17: 2-d echo: Impressions:  Normal LV size with moderate to severe LV hypertrophy. EF 60-65%.  Normal RV size and systolic function. No significant valvular abnormalities.  08-18-17: VQ scan: No evidence of pulmonary embolism. No VQ mismatches.  08-18-17: chest x-ray: Cardiomegaly with pulmonary vascular congestion and possible mild interstitial edema.   08-18-17: chest x-ray: Cardiomegaly with pulmonary vascular congestion and possible mild interstitial edema.  08-19-17: swallow study:  nectar thick liquids,  08-19-17: renal ultrasound: Bilateral cortical atrophy and increased cortical echotexture consistent with medical renal disease. No hydronephrosis.  08-23-17: chest x-ray: Improved bilateral lower lobe density consistent with improving pneumonia. Mild persistent density left more than right.  09-11-17: chest x-ray: no acute cardiopulmonary disease   10-01-17: swallow study: chopped with thin liquids   NO NEW LABS    LABS REVIEWED:PREVIOUS:    09-26-16: wbc 6.2; hgb 11.2; hct 36.6; mcv 99.0; plt 110; glucose 155; bun 29.0; creat 1.25; k+ 5.0; na++ 144; liver normal albumin 3.5 hgb a1c 7.8  01-25-17: chol 145; ldl 92; trig 199; hdl 27  hgb a1c 7.3 04-24-17: wbc 6.3; hgb  11.4; hct 36.2; mcv 97.2; plt 107; glucose 243; bun 35.1; creat 1.40; k+ 4.6; na++ 145; ca 8.0; liver normal albumin 3.1; ammonia 48  05-22-17: urine micro-albumin: >51.3 07-01-17: hgb a1c 7.9; depakote 15 urine micro-albumin 88  08-17-17: wbc 5.4; hgb 10.2; hct 32.6; mcv 97.9; plt 130; glucose 293; bun 48; creat 2.37; k+ 4.8; na++ 138; ca 7.9; BNP 314.7; urine culture: no growth; HIV; nr; hgb a1c 7.2 08-19-17: wbc 6.6; hgb 9.0; hct 27.5; mcv 96.5; plt 129; glucose 365; bun 64; creat 2.44; k+ 4.3; na++ 140; ca 7.1 08-21-17: wbc 9.9; hgb 8.5; hct 25.9; mcv 95.9; plt 153; glucose 342; bun 91; creat 2.80; k+ 4.3; na++ 140; ca 7.1 08-23-17: wbc 13.0; hgb 8.9; hct 28.1; mcv 95.9; plt 194; glucose 214; bun 61; creat 1.72; k+ 3.8; na++ 146; ca 7.7 08-25-17: wbc 13.9; hgb 9.5; hct 29.0; mcv 96.0; plt 195; glucose 282; bun 51; creat 1.72; k+ 4.9; na++ 142; ca 8.0 08-26-17: wbc 13.7; hgb 10.0; hct 32.4; mcv 97; plt 185; glucose 336; bun 51; creat 1.65; k+ 3.9; na++ 139; ca 8.0 08-27-17; wbc 11.9; hgb 9.9; hct 30.9; mcv 96; plt 174 09-11-17: wbc 4.5 hgb 8.4; hct 26.1; mcv 98.7. ;plt 187; glucose 116; bun 62.5; creat 3.17; k+ 5.3; na++ 140; ca 8.4    NO NEW LABS     Review of Systems  Unable to perform ROS: Dementia (unable  to fully participate )     Physical Exam  Constitutional: No distress.  Frail   Neck: Neck supple. No thyromegaly present.  Cardiovascular: Normal rate, regular rhythm and intact distal pulses.  Murmur heard. Pulmonary/Chest: Effort normal and breath sounds normal. No respiratory distress.  02 dependent   Abdominal: Soft. Bowel sounds are normal. He exhibits no distension. There is no tenderness.  Musculoskeletal: He exhibits no edema.  Able to move all extremities   Lymphadenopathy:    He has no cervical adenopathy.  Neurological: He is alert.  Skin: Skin is warm and dry. He is not diaphoretic.  Psychiatric: He has a normal mood and affect.    ASSESSMENT/ PLAN:  TODAY:  1. Dyslipidemia:stable will continue lipitor 20 mg daily ldl is 92   2. Anemia of chronic disease: hgb is 8.4;  Stable will continue iron daily   3. CAD: status post cabg: recent NSTEMI stable  no complaint of chest pain present; will continue asa 81 mg daily is on statin  4. Vascular dementia: no change in status; his current weight is 208 pounds; will continue namzaric 28-10 mg daily   will continue depakote sprinkles 250 mg three times daily to stabilize mood  PREVIOUS  5. Diabetes: hgb a1c 7.2 (previous 7.9)  Stable will continue levemir 15  units nightly; novolog 10 units after meals and tradjenta 5 mg daily urine micro-albumin 88. Is on asa and statin    6. CHF: EF (60-65%) he is presently stable will  is presently not on diuretic continue hydralazine 50 mg  three times daily   7. PVD: will continue asa 81 mg daily; tylenol 650 mg every 6 hours for pain management takes neurontin 200 mg nightly   8. Chronic renal disease stage III: without change   Bun 62.5 creat 3.17  9. Hypertensive heart disease with CHF: is stable b/p: 101/62: will continue norvasc 2.5 mg daily; apresoline 50 mg three times daily and vasotec 20 mg daily    10. Chronic respiratory failure with hypoxia: is without changes: will  continue pulmicort  neb treatment twice daily brovana neb treatment twice daily and duoneb every 6 hours as needed  11. Dysphagia: without change no signs of aspiration present: will continue nectar thick liquids.    MD is aware of resident's narcotic use and is in agreement with current plan of care. We will attempt to wean resident as apropriate   Synthia Innocent NP [facility]  Contact [phone] Monday through Friday 8am- 5pm  After hours call [phone]

## 2017-11-07 ENCOUNTER — Encounter: Payer: Self-pay | Admitting: Adult Health

## 2017-11-07 ENCOUNTER — Non-Acute Institutional Stay (SKILLED_NURSING_FACILITY): Payer: Medicare Other | Admitting: Adult Health

## 2017-11-07 DIAGNOSIS — IMO0002 Reserved for concepts with insufficient information to code with codable children: Secondary | ICD-10-CM

## 2017-11-07 DIAGNOSIS — I739 Peripheral vascular disease, unspecified: Secondary | ICD-10-CM

## 2017-11-07 DIAGNOSIS — F0150 Vascular dementia without behavioral disturbance: Secondary | ICD-10-CM | POA: Diagnosis not present

## 2017-11-07 DIAGNOSIS — E1121 Type 2 diabetes mellitus with diabetic nephropathy: Secondary | ICD-10-CM | POA: Diagnosis not present

## 2017-11-07 DIAGNOSIS — I5022 Chronic systolic (congestive) heart failure: Secondary | ICD-10-CM | POA: Diagnosis not present

## 2017-11-07 DIAGNOSIS — E1165 Type 2 diabetes mellitus with hyperglycemia: Secondary | ICD-10-CM

## 2017-11-07 NOTE — Progress Notes (Signed)
Location:   [facility] Room Number: 227 A Place of Service:  SNF (31)   CODE STATUS: DNR  No Known Allergies  Chief Complaint  Patient presents with  . 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 He is unable to participate in the hpi or ros. There are no reports of changes in appetite; no fevers; no changes in behaviors. There are no nursing concerns at this time.    Past Medical History:  Diagnosis Date  . Anemia   . Bradycardia   . CAD (coronary artery disease) of bypass graft    Multivessel  . Cataract    bilateral  . CHF (congestive heart failure) (HCC)   . Chronic kidney disease    Stage 3  . Dementia    MMSE 18/30 Feb 2012 [facility](Hospital), with behavioral disturbances  . Diabetes mellitus   . Dysphagia   . Hearing deficit   . Hyperlipidemia   . Hypertension   . Neuropathy   . PVD (peripheral vascular disease) (HCC)   . Thrombocytopenia (HCC)     Past Surgical History:  Procedure Laterality Date  . CORONARY ARTERY BYPASS GRAFT    . PERIPHERAL VASCULAR CATHETERIZATION N/A 11/28/2016   Procedure: Abdominal Aortogram w/Lower Extremity;  Surgeon: Maeola HarmanBrandon Christopher Cain, MD;  [facility];  Service: Cardiovascular;  Laterality: N/A;    Social History   Socioeconomic History  . Marital status: Single    Spouse name: Not on file  . Number of children: Not on file  . Years of education: Not on file  . Highest education level: Not on file  Social Needs  . Financial resource strain: Not on file  . Food insecurity - worry: Not on file  . Food insecurity - inability: Not on file  . Transportation needs - medical: Not on file  . Transportation  needs - non-medical: Not on file  Occupational History  . Not on file  Tobacco Use  . Smoking status: Former Smoker    Last attempt to quit: 11/19/1948    Years since quitting: 69.0  . Smokeless tobacco: Never Used  Substance and Sexual Activity  . Alcohol use: No  . Drug use: No  . Sexual activity: Not Currently  Other Topics Concern  . Not on file  Social History Narrative  . Not on file   Family History  Problem Relation Age of Onset  . Diabetes Mother   . Dementia Mother        alzheimer  . Dementia Father   . Stroke Father   . Heart disease Father   . Dementia Sister       VITAL SIGNS BP 118/80   Pulse 68   Temp 97.6 F (36.4 C)   Resp 14   Ht 5\' 8"  (1.727 m)   Wt 196 lb 12.8 oz (89.3 kg)   SpO2 97%   BMI 29.92 kg/m   Outpatient Encounter Medications as of 11/07/2017  Medication Sig  . acetaminophen (TYLENOL) 325 MG tablet Take 650 mg by mouth every 6 (six) hours as needed.   . Amino Acids-Protein Hydrolys (FEEDING SUPPLEMENT, PRO-STAT SUGAR FREE 64,) LIQD Take 30 mLs by mouth 2 (two) times daily.  .Marland Kitchen  amLODipine (NORVASC) 2.5 MG tablet Take 2.5 mg by mouth daily.  Marland Kitchen. arformoterol (BROVANA) 15 MCG/2ML NEBU Take 2 mLs (15 mcg total) by nebulization 2 (two) times daily.  Marland Kitchen. aspirin EC 81 MG tablet Take 81 mg by mouth daily.  Marland Kitchen. atorvastatin (LIPITOR) 20 MG tablet Take 20 mg by mouth at bedtime.  . budesonide (PULMICORT) 0.25 MG/2ML nebulizer solution Take 0.25 mg by nebulization 2 (two) times daily.  . cholecalciferol (VITAMIN D) 1000 UNITS tablet Take 2,000 Units by mouth daily.  . divalproex (DEPAKOTE SPRINKLE) 125 MG capsule Take 250 mg by mouth 3 (three) times daily.   . enalapril (VASOTEC) 20 MG tablet Take 20 mg by mouth daily.  . Eyelid Cleansers (OCUSOFT EYELID CLEANSING EX) Apply to bilateral eye lids topically one time daily for Bilateral Marginal Blepharitis  . ferrous sulfate 325 (65 FE) MG tablet Take 325 mg by mouth daily with breakfast.  . gabapentin  (NEURONTIN) 100 MG capsule Take 200 mg by mouth at bedtime.  . hydrALAZINE (APRESOLINE) 50 MG tablet Take 50 mg by mouth every 8 (eight) hours.  . insulin aspart (NOVOLOG) 100 UNIT/ML injection Inject as per sliding scale subcutaneously after meals: 0 - 150 = 0 units, 151 - 450 = 10 units Call MD if less than 70 and greater than 450  . insulin detemir (LEVEMIR) 100 UNIT/ML injection Inject 15 Units into the skin at bedtime.   Marland Kitchen. ipratropium-albuterol (DUONEB) 0.5-2.5 (3) MG/3ML SOLN Take 3 mLs by nebulization every 6 (six) hours as needed.  . linagliptin (TRADJENTA) 5 MG TABS tablet Take 5 mg by mouth daily.  . Memantine HCl-Donepezil HCl (NAMZARIC) 28-10 MG CP24 Take 1 capsule by mouth at bedtime.   . Multiple Vitamins-Minerals (DECUBI-VITE) CAPS Take 1 capsule by mouth daily.  . Nutritional Supplements (NUTRITIONAL SUPPLEMENT PO) HSG Puree diet - HSG Puree texture, Nectar consistency  . OXYGEN Inhale into the lungs. 2L/min via nasal cannula every shift  . saccharomyces boulardii (FLORASTOR) 250 MG capsule Take 250 mg by mouth 2 (two) times daily.   No facility-administered encounter medications on file as of 11/07/2017.      SIGNIFICANT DIAGNOSTIC EXAMS  PREVIOUS   11-03-15: ct of abdomen and pelvis: 1. No definite acute abnormality seen within the abdomen or pelvis. 2. Trace fluid at the right lower quadrant is nonspecific. 3. Diffuse calcification along the abdominal aorta and its branches, including along the superior mesenteric artery, inferior mesenteric artery and at the proximal renal arteries bilaterally. 4. Mild bibasilar atelectasis noted. 5. Diffuse coronary artery calcifications seen. 6. Mild bilateral renal atrophy noted.  08-15-17: chest x-ray: mild chf  08-17-17: chest x-ray: Stable cardiomegaly.  08-18-17: 2-d echo: Impressions:  Normal LV size with moderate to severe LV hypertrophy. EF 60-65%.  Normal RV size and systolic function. No significant valvular  abnormalities.  08-18-17: VQ scan: No evidence of pulmonary embolism. No VQ mismatches.  08-18-17: chest x-ray: Cardiomegaly with pulmonary vascular congestion and possible mild interstitial edema.   08-18-17: chest x-ray: Cardiomegaly with pulmonary vascular congestion and possible mild interstitial edema.  08-19-17: swallow study: nectar thick liquids,  08-19-17: renal ultrasound: Bilateral cortical atrophy and increased cortical echotexture consistent with medical renal disease. No hydronephrosis.  08-23-17: chest x-ray: Improved bilateral lower lobe density consistent with improving pneumonia. Mild persistent density left more than right.  09-11-17: chest x-ray: no acute cardiopulmonary disease   10-01-17: swallow study: chopped with thin liquids   NO NEW LABS    LABS REVIEWED:PREVIOUS:  01-25-17: chol 145; ldl 92; trig 199; hdl 27  hgb a1c 7.3 04-24-17: wbc 6.3; hgb 11.4; hct 36.2; mcv 97.2; plt 107; glucose 243; bun 35.1; creat 1.40; k+ 4.6; na++ 145; ca 8.0; liver normal albumin 3.1; ammonia 48  05-22-17: urine micro-albumin: >51.3 07-01-17: hgb a1c 7.9; depakote 15 urine micro-albumin 88  08-17-17: wbc 5.4; hgb 10.2; hct 32.6; mcv 97.9; plt 130; glucose 293; bun 48; creat 2.37; k+ 4.8; na++ 138; ca 7.9; BNP 314.7; urine culture: no growth; HIV; nr; hgb a1c 7.2 08-19-17: wbc 6.6; hgb 9.0; hct 27.5; mcv 96.5; plt 129; glucose 365; bun 64; creat 2.44; k+ 4.3; na++ 140; ca 7.1 08-21-17: wbc 9.9; hgb 8.5; hct 25.9; mcv 95.9; plt 153; glucose 342; bun 91; creat 2.80; k+ 4.3; na++ 140; ca 7.1 08-23-17: wbc 13.0; hgb 8.9; hct 28.1; mcv 95.9; plt 194; glucose 214; bun 61; creat 1.72; k+ 3.8; na++ 146; ca 7.7 08-25-17: wbc 13.9; hgb 9.5; hct 29.0; mcv 96.0; plt 195; glucose 282; bun 51; creat 1.72; k+ 4.9; na++ 142; ca 8.0 08-26-17: wbc 13.7; hgb 10.0; hct 32.4; mcv 97; plt 185; glucose 336; bun 51; creat 1.65; k+ 3.9; na++ 139; ca 8.0 08-27-17; wbc 11.9; hgb 9.9; hct 30.9; mcv 96; plt 174 09-11-17:  wbc 4.5 hgb 8.4; hct 26.1; mcv 98.7. ;plt 187; glucose 116; bun 62.5; creat 3.17; k+ 5.3; na++ 140; ca 8.4    NO NEW LABS     Review of Systems  Unable to perform ROS: Dementia (unable to fully participate in ros)    Physical Exam  Constitutional: No distress.  Frail   Neck: No thyromegaly present.  Cardiovascular: Normal rate, regular rhythm and intact distal pulses.  Murmur heard. 1/6  Pulmonary/Chest: Effort normal and breath sounds normal. No respiratory distress.  Using 02  Abdominal: Soft. Bowel sounds are normal. He exhibits no distension. There is no tenderness.  Musculoskeletal: He exhibits no edema.  Able to move all extremities   Lymphadenopathy:    He has no cervical adenopathy.  Neurological: He is alert.  Skin: Skin is warm and dry. He is not diaphoretic.  Unstaged right lateral heel: 3.4 x 4.4 x NM cm Stage III sacral: 6.8 x 5.8 x 0.1 cm   Psychiatric: He has a normal mood and affect.      ASSESSMENT/ PLAN:  TODAY:  1. Diabetes: hgb a1c 7.2 (previous 7.9)  Stable will continue levemir 15  units nightly; novolog 10 units after meals and tradjenta 5 mg daily urine micro-albumin 88. Is on asa and statin    2. CHF: EF (60-65%) he is presently stable will  is presently not on diuretic continue hydralazine 50 mg  three times daily   3. PVD: will continue asa 81 mg daily; tylenol 650 mg every 6 hours for pain management takes neurontin 200 mg nightly   4. Vascular dementia: no change in status; his current weight is 196 pounds; will continue namzaric 28-10 mg daily  GDR: will lower depakote to 125 mg in the AM and 250 mg twice daily   PREVIOUS  5. Chronic renal disease stage III: without change   Bun 62.5 creat 3.17  6. Hypertensive heart disease with CHF: is stable b/p: 101/62: will continue norvasc 2.5 mg daily; apresoline 50 mg three times daily and vasotec 20 mg daily    7. Chronic respiratory failure with hypoxia: is without changes: will continue  pulmicort neb treatment twice daily brovana neb treatment twice daily and duoneb every  6 hours as needed  8. Dysphagia: without change no signs of aspiration present: will continue plan of care    9. Dyslipidemia:stable will continue lipitor 20 mg daily ldl is 92   10. Anemia of chronic disease: hgb is 8.4;  Stable will continue iron daily   11. CAD: status post cabg: recent NSTEMI stable  no complaint of chest pain present; will continue asa 81 mg daily is on statin  Will check hgb a1c   MD is aware of resident's narcotic use and is in agreement with current plan of care. We will attempt to wean resident as apropriate     Synthia Innocent NP [facility]  Contact [phone] Monday through Friday 8am- 5pm  After hours call [phone]

## 2017-11-08 LAB — HEMOGLOBIN A1C: Hemoglobin A1C: 7.1

## 2017-12-25 LAB — BASIC METABOLIC PANEL
BUN: 28 — AB (ref 4–21)
Creatinine: 1.3 (ref 0.6–1.3)
GLUCOSE: 131
POTASSIUM: 5.7 — AB (ref 3.4–5.3)
SODIUM: 144 (ref 137–147)

## 2017-12-25 LAB — LIPID PANEL
Cholesterol: 119 (ref 0–200)
HDL: 28 — AB (ref 35–70)
LDL CALC: 68
Triglycerides: 117 (ref 40–160)

## 2017-12-25 LAB — CBC AND DIFFERENTIAL
HEMATOCRIT: 28 — AB (ref 41–53)
Hemoglobin: 8.3 — AB (ref 13.5–17.5)
NEUTROS ABS: 7
Platelets: 292 (ref 150–399)
WBC: 10.4

## 2017-12-25 LAB — HEPATIC FUNCTION PANEL
ALT: 16 (ref 10–40)
AST: 15 (ref 14–40)
Alkaline Phosphatase: 60 (ref 25–125)
BILIRUBIN, TOTAL: 0.2

## 2018-01-13 ENCOUNTER — Non-Acute Institutional Stay (SKILLED_NURSING_FACILITY): Payer: Medicare Other | Admitting: Internal Medicine

## 2018-01-13 ENCOUNTER — Encounter: Payer: Self-pay | Admitting: Internal Medicine

## 2018-01-13 DIAGNOSIS — I739 Peripheral vascular disease, unspecified: Secondary | ICD-10-CM | POA: Diagnosis not present

## 2018-01-13 DIAGNOSIS — I5022 Chronic systolic (congestive) heart failure: Secondary | ICD-10-CM | POA: Diagnosis not present

## 2018-01-13 DIAGNOSIS — Z794 Long term (current) use of insulin: Secondary | ICD-10-CM | POA: Diagnosis not present

## 2018-01-13 DIAGNOSIS — N183 Chronic kidney disease, stage 3 unspecified: Secondary | ICD-10-CM

## 2018-01-13 DIAGNOSIS — E1165 Type 2 diabetes mellitus with hyperglycemia: Secondary | ICD-10-CM

## 2018-01-13 DIAGNOSIS — D631 Anemia in chronic kidney disease: Secondary | ICD-10-CM

## 2018-01-13 DIAGNOSIS — F0150 Vascular dementia without behavioral disturbance: Secondary | ICD-10-CM | POA: Diagnosis not present

## 2018-01-13 DIAGNOSIS — I10 Essential (primary) hypertension: Secondary | ICD-10-CM

## 2018-01-13 NOTE — Progress Notes (Signed)
Patient ID: Travis Coleman, male   DOB: 23-Sep-1932, 84 y.o.   MRN: [id number]  Location:  [facility]   Place of Service:  SNF (31) Provider:  DR Nechemia Chiappetta Ivan Croft, DO  Patient Care Team: Kirt Boys, DO as PCP - General (Internal Medicine) Center, [facility])  Extended Emergency Contact Information Primary Emergency Contact: Wendelyn Breslow [place] of [place] Home Phone: [phone] Mobile Phone: [phone] Relation: Sister  Code Status:  DNR Goals of care: Advanced Directive information Advanced Directives 11/07/2017  Does Patient Have a Medical Advance Directive? Yes  Type of Advance Directive Out of facility DNR (pink MOST or yellow form)  Does patient want to make changes to medical advance directive? No - Patient declined  Copy of Healthcare Power of Attorney in Chart? -  Would patient like information on creating a medical advance directive? No - Patient declined  Pre-existing out of facility DNR order (yellow form or pink MOST form) Pink MOST form placed in chart (order not valid for inpatient use);Yellow form placed in chart (order not valid for inpatient use)     Chief Complaint  Patient presents with  . Medical Management of Chronic Issues    [facility]    HPI:  Pt is a 84 y.o. male seen today for medical management of chronic diseases.  He gets nutritional supplements per facility protocol. On 01/10/18 he slid off his air mattress onto the floor from lowest level. No apparent injury. No f/c. He is a poor historian due to dementia. Hx obtained from chart.  DM - borderline controlled. A1c 7.9%. CBG 246. He takes levemir 15 units nightly; novolog 10 units after meals and po tradjenta 5 mg daily. Urine microalbumin/Cr ratio >583.6. He takes ASA and statin  CHF - stable off diuretic. EF (60-65%); takes hydralazine 50 mg  three times daily. He is on chronic [place] O2  PVD - stable on ASA 81 mg daily;  tylenol 650 mg every 6 hours for pain management; neurontin 200 mg nightly; trental  Vascular dementia - end stage; albumin 3.1; he gets nutritional supplements;  namzaric 28-10 mg daily; mood stable on depakote 125 mg in the AM and 250 mg twice daily (GDR)  CKD - stage 3. Improved; Cr 1.07  HTN - BP stable on norvasc 2.5 mg daily; apresoline 50 mg three times daily; vasotec 20 mg daily    Chronic respiratory failure with hypoxia - stable on [place] O2; pulmicort neb treatment twice daily; brovana neb treatment twice daily; duoneb every 6 hours as needed  Dysphagia - stable with no signs of aspiration  Dyslipidemia - stable on lipitor 20 mg daily; LDL 68  Anemia of chronic disease - stable on iron daily; Hgb 8.3  CAD - s/p NSTEMI; s/p CABG. No recent chest pain; takes ASA 81 mg daily and statin   Past Medical History:  Diagnosis Date  . Anemia   . Bradycardia   . CAD (coronary artery disease) of bypass graft    Multivessel  . Cataract    bilateral  . CHF (congestive heart failure) (HCC)   . Chronic kidney disease    Stage 3  . Dementia    MMSE 18/30 Feb 2012 [facility]), with behavioral disturbances  . Diabetes mellitus   . Dysphagia   . Hearing deficit   . Hyperlipidemia   . Hypertension   . Neuropathy   . PVD (peripheral vascular disease) (HCC)   . Thrombocytopenia (HCC)  Past Surgical History:  Procedure Laterality Date  . CORONARY ARTERY BYPASS GRAFT    . PERIPHERAL VASCULAR CATHETERIZATION N/A 11/28/2016   Procedure: Abdominal Aortogram w/Lower Extremity;  Surgeon: Maeola Harman, MD;  Location: [facility] INVASIVE CV LAB;  Service: Cardiovascular;  Laterality: N/A;    No Known Allergies  Outpatient Encounter Medications as of 01/13/2018  Medication Sig  . acetaminophen (TYLENOL) 325 MG tablet Take 650 mg by mouth every 6 (six) hours as needed.   . Amino Acids-Protein Hydrolys (FEEDING SUPPLEMENT, PRO-STAT SUGAR FREE 64,) LIQD Take 30 mLs by mouth 2 (two) times  daily.  Marland Kitchen amLODipine (NORVASC) 2.5 MG tablet Take 2.5 mg by mouth daily.  Marland Kitchen arformoterol (BROVANA) 15 MCG/2ML NEBU Take 2 mLs (15 mcg total) by nebulization 2 (two) times daily.  Marland Kitchen aspirin EC 81 MG tablet Take 81 mg by mouth daily.  Marland Kitchen atorvastatin (LIPITOR) 20 MG tablet Take 20 mg by mouth at bedtime.  . budesonide (PULMICORT) 0.25 MG/2ML nebulizer solution Take 0.25 mg by nebulization 2 (two) times daily.  . cholecalciferol (VITAMIN D) 1000 UNITS tablet Take 2,000 Units by mouth daily.  . divalproex (DEPAKOTE SPRINKLE) 125 MG capsule Take 250 mg by mouth 3 (three) times daily.   . enalapril (VASOTEC) 20 MG tablet Take 20 mg by mouth daily.  . Eyelid Cleansers (OCUSOFT EYELID CLEANSING EX) Apply to bilateral eye lids topically one time daily for Bilateral Marginal Blepharitis  . ferrous sulfate 325 (65 FE) MG tablet Take 325 mg by mouth daily with breakfast.  . gabapentin (NEURONTIN) 100 MG capsule Take 200 mg by mouth at bedtime.  . hydrALAZINE (APRESOLINE) 50 MG tablet Take 50 mg by mouth every 8 (eight) hours.  . insulin aspart (NOVOLOG) 100 UNIT/ML injection Inject as per sliding scale subcutaneously after meals: 0 - 150 = 0 units, 151 - 450 = 10 units Call MD if less than 70 and greater than 450  . insulin detemir (LEVEMIR) 100 UNIT/ML injection Inject 15 Units into the skin at bedtime.   Marland Kitchen ipratropium-albuterol (DUONEB) 0.5-2.5 (3) MG/3ML SOLN Take 3 mLs by nebulization every 6 (six) hours as needed.  . linagliptin (TRADJENTA) 5 MG TABS tablet Take 5 mg by mouth daily.  . Memantine HCl-Donepezil HCl (NAMZARIC) 28-10 MG CP24 Take 1 capsule by mouth at bedtime.   . Multiple Vitamins-Minerals (DECUBI-VITE) CAPS Take 1 capsule by mouth daily.  . Nutritional Supplements (NUTRITIONAL SUPPLEMENT PO) HSG Puree diet - HSG Puree texture, Nectar consistency  . OXYGEN Inhale into the lungs. 2L/min via nasal cannula every shift  . saccharomyces boulardii (FLORASTOR) 250 MG capsule Take 250 mg by mouth  2 (two) times daily.   No facility-administered encounter medications on file as of 01/13/2018.     Review of Systems  Unable to perform ROS: Dementia    Immunization History  Administered Date(s) Administered  . Influenza,inj,Quad PF,6+ Mos 12/06/2013  . Influenza-Unspecified 12/28/2015, 08/26/2016  . PPD Test 08/10/2016, 08/17/2016  . Pneumococcal Polysaccharide-23 12/06/2013   Pertinent  Health Maintenance Due  Topic Date Due  . FOOT EXAM  01/23/2018 (Originally 04/02/1942)  . INFLUENZA VACCINE  06/27/2018 (Originally 06/19/2017)  . PNA vac Low Risk Adult (2 of 2 - PCV13) 01/11/2044 (Originally 12/06/2014)  . HEMOGLOBIN A1C  05/09/2018  . OPHTHALMOLOGY EXAM  07/10/2018   Fall Risk  05/23/2017  Falls in the past year? Yes  Number falls in past yr: 1  Injury with Fall? No   Functional Status Survey:    Vitals:  01/13/18 1603  BP: 138/80  Pulse: 80  Temp: 97.8 F (36.6 C)  SpO2: 98%  Weight: 191 lb 6.4 oz (86.8 kg)   Body mass index is 29.1 kg/m. Physical Exam  Constitutional: He appears well-developed.  Sitting in w/c in NAD, frail appearing  HENT:  Mouth/Throat: Oropharynx is clear and moist.  MM dry but pt sleeping with mouth open  Eyes: Pupils are equal, round, and reactive to light. Right eye exhibits discharge (thick green). Left eye exhibits discharge (thick green). No scleral icterus.  Neck: Neck supple. Carotid bruit is not present. No thyromegaly present.  Cardiovascular: Normal rate, regular rhythm and intact distal pulses. Exam reveals no gallop and no friction rub.  Murmur (1/6 SEM) heard. B/l LE edema. No calf TTP; b/l unna boots intact  Pulmonary/Chest: Effort normal and breath sounds normal. He has no wheezes. He has no rales. He exhibits no tenderness.  Abdominal: Soft. Normal appearance and bowel sounds are normal. He exhibits no distension, no abdominal bruit, no pulsatile midline mass and no mass. There is no hepatomegaly. There is no tenderness.  There is no rigidity, no rebound and no guarding. No hernia.  obese  Musculoskeletal: He exhibits edema and deformity (toes b/l).  Lymphadenopathy:    He has no cervical adenopathy.  Neurological: He is alert.  Skin: Skin is warm and dry. No rash noted.  Psychiatric: He has a normal mood and affect. His behavior is normal.    Labs reviewed: Recent Labs    08/22/17 1043  08/24/17 0159 08/25/17 0250 08/26/17 1108 09/04/17 09/11/17  NA  --    < > 142 142 139 139 140  K  --    < > 4.4 4.9 3.9 4.6 5.3  CL  --    < > 109 108 104  --   --   CO2  --    < > 22 24 23   --   --   GLUCOSE  --    < > 333* 282* 336*  --   --   BUN  --    < > 51* 51* 51* 54* 63*  CREATININE  --    < > 1.72* 1.72* 1.65* 2.5* 3.2*  CALCIUM  --    < > 7.8* 8.0* 8.0*  --   --   MG 2.1  --   --   --   --   --   --    < > = values in this interval not displayed.   Recent Labs    04/24/17 09/04/17  AST 7* 12*  ALT 8* 11  ALKPHOS 58 65   Recent Labs    08/25/17 0250 08/26/17 1108 08/27/17 0400 09/04/17 09/11/17 09/12/17  WBC 13.9* 13.7* 11.9* 5.4 4.5 4.7  NEUTROABS  --   --   --  4 2 3   HGB 9.5* 10.0* 9.9* 9.7* 8.4* 8.3*  HCT 29.0* 32.4* 30.9* 30* 26* 26*  MCV 96.0 97.0 96.0  --   --   --   PLT 195 185 174 113* 187 206   Lab Results  Component Value Date   TSH 0.614 11/03/2015   Lab Results  Component Value Date   HGBA1C 7.1 11/08/2017   Lab Results  Component Value Date   CHOL 145 01/25/2017   HDL 27 (A) 01/25/2017   LDLCALC 92 01/25/2017   TRIG 199 (A) 01/25/2017   CHOLHDL 8.8 12/04/2013    Significant Diagnostic Results in last 30 days:  No  results found.  Assessment/Plan   ICD-10-CM   1. Type 2 diabetes mellitus with hyperglycemia, with long-term current use of insulin (HCC) E11.65    Z79.4   2. Vascular dementia without behavioral disturbance F01.50   3. Chronic systolic CHF (congestive heart failure) (HCC) I50.22   4. Anemia in stage 3 chronic kidney disease (HCC) N18.3    D63.1    5. PVD (peripheral vascular disease) (HCC) I73.9   6. Essential hypertension I10     Cont current meds as ordered. May need to titrate insulin but await A1c  PT/OT/ST as indicated  Fall precautions  Cont nutritional supplement as ordered  Will follow  [facility] NP to follow  Labs/tests ordered: A1c and depakote level   Shannelle Alguire S. Ancil Linseyarter, D. O., F. A. C. O. I.  [facility] [address] [place], [place] [address] [phone] Cell (Monday-Friday 8 AM - 5 PM) [phone] After 5 PM and follow prompts

## 2018-01-14 LAB — HEMOGLOBIN A1C: HEMOGLOBIN A1C: 7.7

## 2018-01-17 ENCOUNTER — Other Ambulatory Visit: Payer: Self-pay

## 2018-01-17 DIAGNOSIS — I739 Peripheral vascular disease, unspecified: Secondary | ICD-10-CM

## 2018-01-30 ENCOUNTER — Non-Acute Institutional Stay (SKILLED_NURSING_FACILITY): Payer: Medicare Other | Admitting: Adult Health

## 2018-01-30 ENCOUNTER — Encounter: Payer: Self-pay | Admitting: Adult Health

## 2018-01-30 DIAGNOSIS — F0150 Vascular dementia without behavioral disturbance: Secondary | ICD-10-CM | POA: Diagnosis not present

## 2018-01-30 DIAGNOSIS — IMO0002 Reserved for concepts with insufficient information to code with codable children: Secondary | ICD-10-CM

## 2018-01-30 DIAGNOSIS — E1165 Type 2 diabetes mellitus with hyperglycemia: Secondary | ICD-10-CM

## 2018-01-30 DIAGNOSIS — J9611 Chronic respiratory failure with hypoxia: Secondary | ICD-10-CM | POA: Diagnosis not present

## 2018-01-30 DIAGNOSIS — I5022 Chronic systolic (congestive) heart failure: Secondary | ICD-10-CM

## 2018-01-30 DIAGNOSIS — E1121 Type 2 diabetes mellitus with diabetic nephropathy: Secondary | ICD-10-CM | POA: Diagnosis not present

## 2018-01-30 NOTE — Progress Notes (Signed)
Location:   [facility] Room Number: 227 A Place of Service:  SNF (31)   CODE STATUS: DNR (Most Form updated 08/20/17)  No Known Allergies  Chief Complaint  Patient presents with  . Medical Management of Chronic Issues    Chf; respiratory failure; diabetes; dementia    HPI:  He is a 84 year old long term resident of this facility being seen for the management of his chronic illnesses: chf; diabetes; respiratory failure and dementia . He is now being followed by hospice care. He is unable to participate in the hpi or ros. His appetite is poor; with little po intake. He does appear to have pain with movement. He does have ulcerations on his bilateral feet. There are no nursing concerns today. Hospice would like for his medication load to be reduced if possible.    Past Medical History:  Diagnosis Date  . Anemia   . Bradycardia   . CAD (coronary artery disease) of bypass graft    Multivessel  . Cataract    bilateral  . CHF (congestive heart failure) (HCC)   . Chronic kidney disease    Stage 3  . Dementia    MMSE 18/30 Feb 2012 [facility]), with behavioral disturbances  . Diabetes mellitus   . Dysphagia   . Hearing deficit   . Hyperlipidemia   . Hypertension   . Neuropathy   . PVD (peripheral vascular disease) (HCC)   . Thrombocytopenia (HCC)     Past Surgical History:  Procedure Laterality Date  . CORONARY ARTERY BYPASS GRAFT    . PERIPHERAL VASCULAR CATHETERIZATION N/A 11/28/2016   Procedure: Abdominal Aortogram w/Lower Extremity;  Surgeon: Maeola Harman, MD;  Location: [facility] INVASIVE CV LAB;  Service: Cardiovascular;  Laterality: N/A;    Social History   Socioeconomic History  . Marital status: Single    Spouse name: Not on file  . Number of children: Not on file  . Years of education: Not on file  . Highest education level: Not on file  Social Needs  . Financial resource strain: Not on file  . Food insecurity - worry: Not on file  .  Food insecurity - inability: Not on file  . Transportation needs - medical: Not on file  . Transportation needs - non-medical: Not on file  Occupational History  . Not on file  Tobacco Use  . Smoking status: Former Smoker    Last attempt to quit: 11/19/1948    Years since quitting: 69.2  . Smokeless tobacco: Never Used  Substance and Sexual Activity  . Alcohol use: No  . Drug use: No  . Sexual activity: Not Currently  Other Topics Concern  . Not on file  Social History Narrative  . Not on file   Family History  Problem Relation Age of Onset  . Diabetes Mother   . Dementia Mother        alzheimer  . Dementia Father   . Stroke Father   . Heart disease Father   . Dementia Sister       VITAL SIGNS BP (!) 132/57   Pulse 96   Temp 98.1 F (36.7 C)   Resp 18   Ht 5\' 8"  (1.727 m)   Wt 188 lb 6.4 oz (85.5 kg)   SpO2 96%   BMI 28.65 kg/m   Outpatient Encounter Medications as of 01/30/2018  Medication Sig  . acetaminophen (TYLENOL) 325 MG tablet Take 650 mg by mouth every 6 (six) hours as  needed.   Marland Kitchen. amLODipine (NORVASC) 2.5 MG tablet Take 2.5 mg by mouth daily.  Marland Kitchen. arformoterol (BROVANA) 15 MCG/2ML NEBU Take 2 mLs (15 mcg total) by nebulization 2 (two) times daily.  Marland Kitchen. aspirin EC 81 MG tablet Take 81 mg by mouth daily.  Marland Kitchen. atorvastatin (LIPITOR) 20 MG tablet Take 20 mg by mouth at bedtime.  . budesonide (PULMICORT) 0.25 MG/2ML nebulizer solution Take 0.25 mg by nebulization 2 (two) times daily.  . cholecalciferol (VITAMIN D) 1000 UNITS tablet Take 2,000 Units by mouth daily.  . divalproex (DEPAKOTE SPRINKLE) 125 MG capsule Take 125 mg by mouth daily.   . divalproex (DEPAKOTE) 250 MG DR tablet Take 250 mg by mouth 2 (two) times daily.  . enalapril (VASOTEC) 20 MG tablet Take 20 mg by mouth daily.  . Eyelid Cleansers (OCUSOFT EYELID CLEANSING EX) Apply to bilateral eye lids topically one time daily for Bilateral Marginal Blepharitis  . ferrous sulfate 325 (65 FE) MG tablet Take  325 mg by mouth daily with breakfast.  . gabapentin (NEURONTIN) 100 MG capsule Take 200 mg by mouth at bedtime.  . hydrALAZINE (APRESOLINE) 50 MG tablet Take 50 mg by mouth every 8 (eight) hours.  . insulin aspart (NOVOLOG) 100 UNIT/ML injection Inject as per sliding scale subcutaneously after meals: 0 - 150 = 0 units, 151 - 450 = 10 units Call MD if less than 70 and greater than 450  . insulin detemir (LEVEMIR) 100 UNIT/ML injection Inject 15 Units into the skin at bedtime.   Marland Kitchen. ipratropium-albuterol (DUONEB) 0.5-2.5 (3) MG/3ML SOLN Take 3 mLs by nebulization every 6 (six) hours as needed.  . linagliptin (TRADJENTA) 5 MG TABS tablet Take 5 mg by mouth daily.  . Memantine HCl-Donepezil HCl (NAMZARIC) 28-10 MG CP24 Take 1 capsule by mouth at bedtime.   . Multiple Vitamins-Minerals (DECUBI-VITE) CAPS Take 1 capsule by mouth daily.  . Nutritional Supplements (NUTRITIONAL SUPPLEMENT PO) HSG Regular diet - HSG Mech Soft texture, Nectar thickened fluids consistency  . Nutritional Supplements (NUTRITIONAL SUPPLEMENT PO) Frozen Nutritional Treat - Give by mouth four times daily in between meals for nutritional support  . Nutritional Supplements (PROMOD PO) Give by mouth three times daily for wound healing  . oxyCODONE-acetaminophen (PERCOCET/ROXICET) 5-325 MG tablet Take 1 tablet by mouth 2 (two) times daily as needed for severe pain.  . OXYGEN Inhale into the lungs. 2L/min via nasal cannula every shift  . pentoxifylline (TRENTAL) 400 MG CR tablet Take 400 mg by mouth 3 (three) times daily with meals.  . saccharomyces boulardii (FLORASTOR) 250 MG capsule Take 250 mg by mouth 2 (two) times daily.  . [DISCONTINUED] Amino Acids-Protein Hydrolys (FEEDING SUPPLEMENT, PRO-STAT SUGAR FREE 64,) LIQD Take 30 mLs by mouth 2 (two) times daily.   No facility-administered encounter medications on file as of 01/30/2018.      SIGNIFICANT DIAGNOSTIC EXAMS  PREVIOUS   11-03-15: ct of abdomen and pelvis: 1. No  definite acute abnormality seen within the abdomen or pelvis. 2. Trace fluid at the right lower quadrant is nonspecific. 3. Diffuse calcification along the abdominal aorta and its branches, including along the superior mesenteric artery, inferior mesenteric artery and at the proximal renal arteries bilaterally. 4. Mild bibasilar atelectasis noted. 5. Diffuse coronary artery calcifications seen. 6. Mild bilateral renal atrophy noted.  08-15-17: chest x-ray: mild chf  08-17-17: chest x-ray: Stable cardiomegaly.  08-18-17: 2-d echo: Impressions:  Normal LV size with moderate to severe LV hypertrophy. EF 60-65%.  Normal RV size and  systolic function. No significant valvular abnormalities.  08-18-17: VQ scan: No evidence of pulmonary embolism. No VQ mismatches.  08-18-17: chest x-ray: Cardiomegaly with pulmonary vascular congestion and possible mild interstitial edema.   08-18-17: chest x-ray: Cardiomegaly with pulmonary vascular congestion and possible mild interstitial edema.  08-19-17: swallow study: nectar thick liquids,  08-19-17: renal ultrasound: Bilateral cortical atrophy and increased cortical echotexture consistent with medical renal disease. No hydronephrosis.  08-23-17: chest x-ray: Improved bilateral lower lobe density consistent with improving pneumonia. Mild persistent density left more than right.  09-11-17: chest x-ray: no acute cardiopulmonary disease   10-01-17: swallow study: chopped with thin liquids   NO NEW LABS    LABS REVIEWED:PREVIOUS:    01-25-17: chol 145; ldl 92; trig 199; hdl 27  hgb a1c 7.3 04-24-17: wbc 6.3; hgb 11.4; hct 36.2; mcv 97.2; plt 107; glucose 243; bun 35.1; creat 1.40; k+ 4.6; na++ 145; ca 8.0; liver normal albumin 3.1; ammonia 48  05-22-17: urine micro-albumin: >51.3 07-01-17: hgb a1c 7.9; depakote 15 urine micro-albumin 88  08-17-17: wbc 5.4; hgb 10.2; hct 32.6; mcv 97.9; plt 130; glucose 293; bun 48; creat 2.37; k+ 4.8; na++ 138; ca 7.9; BNP 314.7;  urine culture: no growth; HIV; nr; hgb a1c 7.2 08-19-17: wbc 6.6; hgb 9.0; hct 27.5; mcv 96.5; plt 129; glucose 365; bun 64; creat 2.44; k+ 4.3; na++ 140; ca 7.1 08-21-17: wbc 9.9; hgb 8.5; hct 25.9; mcv 95.9; plt 153; glucose 342; bun 91; creat 2.80; k+ 4.3; na++ 140; ca 7.1 08-23-17: wbc 13.0; hgb 8.9; hct 28.1; mcv 95.9; plt 194; glucose 214; bun 61; creat 1.72; k+ 3.8; na++ 146; ca 7.7 08-25-17: wbc 13.9; hgb 9.5; hct 29.0; mcv 96.0; plt 195; glucose 282; bun 51; creat 1.72; k+ 4.9; na++ 142; ca 8.0 08-26-17: wbc 13.7; hgb 10.0; hct 32.4; mcv 97; plt 185; glucose 336; bun 51; creat 1.65; k+ 3.9; na++ 139; ca 8.0 08-27-17; wbc 11.9; hgb 9.9; hct 30.9; mcv 96; plt 174 09-11-17: wbc 4.5 hgb 8.4; hct 26.1; mcv 98.7. ;plt 187; glucose 116; bun 62.5; creat 3.17; k+ 5.3; na++ 140; ca 8.4    NO NEW LABS     Review of Systems  Unable to perform ROS: Dementia (nonverbal )    Physical Exam  Constitutional: No distress.  Frail   Neck: No thyromegaly present.  Cardiovascular: Normal rate and regular rhythm.  Murmur heard. 3/6 Pedals pulses unable to palpate   Pulmonary/Chest: Effort normal. No respiratory distress.  Breath sounds diminished  Uses 02  Abdominal: Soft. He exhibits no distension.  Musculoskeletal: He exhibits no edema.  Lymphadenopathy:    He has no cervical adenopathy.  Neurological:  Not responsive  Skin: Skin is warm and dry. He is not diaphoretic. There is pallor.  Dressings to feet intact      ASSESSMENT/ PLAN:  TODAY:  1. 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 Chronic systolic CHF: EF 86-57%(8-46-96)  3. Vascular dementia without behavioral disturbance:  4. Chronic respiratory failure with hypoxia:  Will stop the following medications: vitamin D; iron; florastor; MVI; lipitor; pentoxifylline; tradjenta; and routine tylenol Will lower depakote to 125 mg twice daily for one week then stop Will lower namenda to 10 mg  daily for one week then stop Will lower hydralazine 50 mg twice daiy for 3 days then daily for 3 days then stop Will begin roxanol 5 mg every 4 hours as needed for pain Will monitor his status.  MD is aware of resident's narcotic use and is in agreement with current plan of care. We will attempt to wean resident as apropriate    Ok Edwards NP [facility]  Contact [phone] Monday through Friday 8am- 5pm  After hours call [phone]

## 2018-02-03 ENCOUNTER — Encounter: Payer: Self-pay | Admitting: Adult Health

## 2018-02-03 DIAGNOSIS — J9611 Chronic respiratory failure with hypoxia: Secondary | ICD-10-CM | POA: Insufficient documentation

## 2018-02-07 ENCOUNTER — Encounter (HOSPITAL_COMMUNITY): Payer: Medicare Other

## 2018-02-07 ENCOUNTER — Ambulatory Visit: Payer: Medicare Other | Admitting: Vascular Surgery

## 2018-02-17 DEATH — deceased
